# Patient Record
Sex: Male | Born: 1950
Health system: Southern US, Community
[De-identification: ages and names within clinical notes are randomized; demographics above are authoritative.]

## PROBLEM LIST (undated history)

## (undated) DIAGNOSIS — J449 Chronic obstructive pulmonary disease, unspecified: Secondary | ICD-10-CM

## (undated) DIAGNOSIS — E785 Hyperlipidemia, unspecified: Secondary | ICD-10-CM

## (undated) DIAGNOSIS — N189 Chronic kidney disease, unspecified: Secondary | ICD-10-CM

## (undated) DIAGNOSIS — C4490 Unspecified malignant neoplasm of skin, unspecified: Secondary | ICD-10-CM

## (undated) DIAGNOSIS — F419 Anxiety disorder, unspecified: Secondary | ICD-10-CM

## (undated) DIAGNOSIS — G629 Polyneuropathy, unspecified: Secondary | ICD-10-CM

## (undated) DIAGNOSIS — R011 Cardiac murmur, unspecified: Secondary | ICD-10-CM

## (undated) DIAGNOSIS — R0600 Dyspnea, unspecified: Secondary | ICD-10-CM

## (undated) DIAGNOSIS — I739 Peripheral vascular disease, unspecified: Secondary | ICD-10-CM

## (undated) DIAGNOSIS — I10 Essential (primary) hypertension: Secondary | ICD-10-CM

## (undated) DIAGNOSIS — I639 Cerebral infarction, unspecified: Secondary | ICD-10-CM

## (undated) DIAGNOSIS — E119 Type 2 diabetes mellitus without complications: Secondary | ICD-10-CM

## (undated) DIAGNOSIS — M1990 Unspecified osteoarthritis, unspecified site: Secondary | ICD-10-CM

## (undated) HISTORY — DX: Chronic kidney disease, unspecified: N18.9

## (undated) HISTORY — DX: Type 2 diabetes mellitus without complications: E11.9

## (undated) HISTORY — DX: Cardiac murmur, unspecified: R01.1

## (undated) HISTORY — PX: OTHER SURGICAL HISTORY: SHX169

## (undated) HISTORY — DX: Essential (primary) hypertension: I10

## (undated) HISTORY — DX: Unspecified malignant neoplasm of skin, unspecified: C44.90

## (undated) HISTORY — DX: Hyperlipidemia, unspecified: E78.5

---

## 2004-09-06 ENCOUNTER — Encounter: Admission: RE | Admit: 2004-09-06 | Discharge: 2004-09-06 | Payer: Self-pay | Admitting: Family Medicine

## 2004-10-10 ENCOUNTER — Ambulatory Visit (HOSPITAL_COMMUNITY): Admission: RE | Admit: 2004-10-10 | Discharge: 2004-10-10 | Payer: Self-pay | Admitting: Family Medicine

## 2004-11-06 ENCOUNTER — Ambulatory Visit (HOSPITAL_COMMUNITY): Admission: RE | Admit: 2004-11-06 | Discharge: 2004-11-06 | Payer: Self-pay | Admitting: Vascular Surgery

## 2004-11-14 ENCOUNTER — Ambulatory Visit (HOSPITAL_COMMUNITY): Admission: RE | Admit: 2004-11-14 | Discharge: 2004-11-14 | Payer: Self-pay | Admitting: Vascular Surgery

## 2004-11-20 ENCOUNTER — Encounter: Admission: RE | Admit: 2004-11-20 | Discharge: 2004-11-20 | Payer: Self-pay | Admitting: Vascular Surgery

## 2004-11-30 ENCOUNTER — Inpatient Hospital Stay (HOSPITAL_COMMUNITY): Admission: RE | Admit: 2004-11-30 | Discharge: 2004-12-09 | Payer: Self-pay | Admitting: Vascular Surgery

## 2010-02-04 ENCOUNTER — Encounter: Payer: Self-pay | Admitting: Vascular Surgery

## 2011-07-31 ENCOUNTER — Ambulatory Visit: Payer: Self-pay | Admitting: Family Medicine

## 2011-07-31 VITALS — BP 148/70 | HR 67 | Temp 98.2°F | Resp 18 | Ht 68.5 in | Wt 175.6 lb

## 2011-07-31 DIAGNOSIS — T22199A Burn of first degree of multiple sites of unspecified shoulder and upper limb, except wrist and hand, initial encounter: Secondary | ICD-10-CM

## 2011-07-31 DIAGNOSIS — T300 Burn of unspecified body region, unspecified degree: Secondary | ICD-10-CM

## 2011-07-31 MED ORDER — SILVER SULFADIAZINE 1 % EX CREA: TOPICAL_CREAM | Freq: Every day | CUTANEOUS | Status: AC

## 2011-07-31 MED ORDER — DOXYCYCLINE HYCLATE 100 MG PO TABS: 100.0000 mg | ORAL_TABLET | Freq: Two times a day (BID) | ORAL | Status: AC
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
## 2011-07-31 NOTE — Patient Instructions (Addendum)

## 2011-08-02 ENCOUNTER — Telehealth: Payer: Self-pay
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
## 2011-08-03 MED ORDER — CEPHALEXIN 500 MG PO CAPS: 500.0000 mg | ORAL_CAPSULE | Freq: Three times a day (TID) | ORAL | Status: AC

## 2011-08-03 NOTE — Telephone Encounter (Signed)
Med changed to Keflex but order is pending until we verify pharmacy

## 2011-08-03 NOTE — Telephone Encounter (Signed)
Had to send to pharm already noted

## 2011-08-03 NOTE — Telephone Encounter (Signed)
Spoke with patient, he states the Doxy he was given is causing his stomach to burn.  Has tried taking medicine with food and without, and with milk.  Can we change him to a different medicine or recommend something else to help?

## 2011-08-03 NOTE — Addendum Note (Signed)
Addended by: Pattricia Boss on: 08/03/2011 07:01 PM   Modules accepted: Orders

## 2011-08-03 NOTE — Telephone Encounter (Signed)
Patient notified

## 2013-02-03 ENCOUNTER — Ambulatory Visit: Payer: Self-pay | Admitting: Family Medicine

## 2013-02-03 VITALS — BP 126/82 | HR 72 | Temp 97.4°F | Resp 18 | Ht 68.0 in | Wt 172.0 lb

## 2013-02-03 DIAGNOSIS — L0291 Cutaneous abscess, unspecified: Secondary | ICD-10-CM

## 2013-02-03 DIAGNOSIS — L0390 Cellulitis, unspecified: Principal | ICD-10-CM

## 2013-02-03 DIAGNOSIS — E119 Type 2 diabetes mellitus without complications: Secondary | ICD-10-CM

## 2013-02-03 DIAGNOSIS — L408 Other psoriasis: Secondary | ICD-10-CM

## 2013-02-03 MED ORDER — TRIAMCINOLONE ACETONIDE 0.1 % EX CREA: 1.0000 "application " | TOPICAL_CREAM | Freq: Two times a day (BID) | CUTANEOUS | Status: DC

## 2013-02-03 MED ORDER — DOXYCYCLINE HYCLATE 100 MG PO CAPS: 100.0000 mg | ORAL_CAPSULE | Freq: Two times a day (BID) | ORAL | Status: DC

## 2013-02-03 NOTE — Patient Instructions (Signed)
Keep hands well washed  Keep any draining lesions covered  Apply triamcinolone cream twice daily to rash  Take the doxycycline one twice daily at breakfast and supper for infection.  Avoid taking it with dairy products such as milk or cheese because that will mass up the absorption of the antibiotic into your system  Return in 2 or 3 days for recheck if not improving considerably. Return at any time if worse.

## 2013-02-03 NOTE — Progress Notes (Signed)
Subjective: 63 year old man with a 2 to three-week history of a the rash on the knuckles of his right hand, which is gotten red and has some pustules or small abscesses on it. This been painful to him. He is diabetic well-controlled sugars.  Objective: Cellulitis across the back of his right hand involving the for knuckles. There a couple of a small abscess areas, and a couple of these were opened with a needle. Some pus was expressed and cultured. The hand is tender. Has dry flaking skin around the borders of and looks like it may have been a psoriatic-type rash initially.  Assessment: Cellulitis and abscess right hand Psoriatic-like rash  Plan: Doxycycline 100 twice a day Triamcinolone cream Watch it closely, and if at all worse come in at any time for recheck

## 2013-02-05 LAB — WOUND CULTURE
GRAM STAIN: NONE SEEN
Organism ID, Bacteria: NO GROWTH

## 2013-02-12 ENCOUNTER — Ambulatory Visit: Payer: Self-pay | Admitting: Physician Assistant

## 2013-02-12 ENCOUNTER — Telehealth: Payer: Self-pay

## 2013-02-12 VITALS — BP 142/80 | HR 66 | Temp 97.6°F | Resp 16 | Ht 68.0 in | Wt 172.0 lb

## 2013-02-12 DIAGNOSIS — L409 Psoriasis, unspecified: Secondary | ICD-10-CM

## 2013-02-12 DIAGNOSIS — L0390 Cellulitis, unspecified: Secondary | ICD-10-CM

## 2013-02-12 DIAGNOSIS — L408 Other psoriasis: Secondary | ICD-10-CM

## 2013-02-12 DIAGNOSIS — L0291 Cutaneous abscess, unspecified: Secondary | ICD-10-CM

## 2013-02-12 MED ORDER — CEPHALEXIN 500 MG PO CAPS: 500.0000 mg | ORAL_CAPSULE | Freq: Three times a day (TID) | ORAL | Status: DC

## 2013-02-12 MED ORDER — SULFAMETHOXAZOLE-TMP DS 800-160 MG PO TABS: 1.0000 | ORAL_TABLET | Freq: Two times a day (BID) | ORAL | Status: DC

## 2013-02-12 NOTE — Telephone Encounter (Signed)
Patient is asking for his lab results.  Was not aware until today's OV w/ Ryan about sending the labs out.    [phone]
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
## 2013-02-13 NOTE — Telephone Encounter (Signed)
Ryan please review.

## 2013-02-13 NOTE — Telephone Encounter (Signed)
No growth 2 days. Follow up if symptoms persist.

## 2013-02-13 NOTE — Telephone Encounter (Signed)
Spoke with pt, advised no growth in his wound cx.

## 2013-04-01 ENCOUNTER — Telehealth: Payer: Self-pay

## 2013-04-01 NOTE — Telephone Encounter (Signed)
Called and spoke to patient, said he would rtc early Monday .

## 2013-04-01 NOTE — Telephone Encounter (Signed)
Sorry, they should be healed by now. RTC.

## 2013-04-01 NOTE — Telephone Encounter (Signed)
Patient saw Christell Faith for hand infection.  He has finished the antibiotics.  Still has a couple of areas that are red and infected.   Requesting another round of antibiotics to complete the healing process.   [facility] [address]    [phone]

## 2013-04-05 ENCOUNTER — Ambulatory Visit: Payer: Self-pay | Admitting: Family Medicine

## 2013-04-05 VITALS — BP 162/68 | HR 73 | Temp 98.1°F | Resp 16 | Ht 68.0 in | Wt 172.6 lb

## 2013-04-05 DIAGNOSIS — L409 Psoriasis, unspecified: Secondary | ICD-10-CM

## 2013-04-05 DIAGNOSIS — E119 Type 2 diabetes mellitus without complications: Secondary | ICD-10-CM

## 2013-04-05 DIAGNOSIS — L0390 Cellulitis, unspecified: Secondary | ICD-10-CM

## 2013-04-05 DIAGNOSIS — L0291 Cutaneous abscess, unspecified: Secondary | ICD-10-CM

## 2013-04-05 DIAGNOSIS — L408 Other psoriasis: Secondary | ICD-10-CM

## 2013-04-05 MED ORDER — BETAMETHASONE DIPROPIONATE 0.05 % EX CREA: TOPICAL_CREAM | Freq: Two times a day (BID) | CUTANEOUS | Status: DC

## 2013-04-05 MED ORDER — SULFAMETHOXAZOLE-TMP DS 800-160 MG PO TABS: 1.0000 | ORAL_TABLET | Freq: Two times a day (BID) | ORAL | Status: DC

## 2013-04-05 NOTE — Patient Instructions (Signed)
Take the antibiotic one twice daily  Wait until about Sunday for beginning to use a small amount of the betamethasone cream twice daily on the skin. Make sure the infection looks well healed before starting the cream.  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  Return at any time if worse.

## 2013-04-05 NOTE — Progress Notes (Signed)
Subjective

## 2013-07-28 ENCOUNTER — Ambulatory Visit (INDEPENDENT_AMBULATORY_CARE_PROVIDER_SITE_OTHER): Payer: Self-pay | Admitting: Family Medicine

## 2013-07-28 VITALS — BP 132/86 | HR 75 | Temp 97.8°F | Resp 16 | Ht 68.0 in | Wt 162.8 lb

## 2013-07-28 DIAGNOSIS — H938X1 Other specified disorders of right ear: Secondary | ICD-10-CM

## 2013-07-28 DIAGNOSIS — H6590 Unspecified nonsuppurative otitis media, unspecified ear: Secondary | ICD-10-CM

## 2013-07-28 DIAGNOSIS — H938X9 Other specified disorders of ear, unspecified ear: Secondary | ICD-10-CM

## 2013-07-28 MED ORDER — AMOXICILLIN 500 MG PO CAPS: 500.0000 mg | ORAL_CAPSULE | Freq: Three times a day (TID) | ORAL | Status: DC

## 2013-07-28 NOTE — Progress Notes (Signed)
Chief Complaint:  Chief Complaint  Patient presents with  . Ear Fullness    feels like water is in his rt ear x 3 days    HPI: Adrian Andrews is a 63 y.o. male who is here for 3 day history of right ear fullness and jaw pain, no fevers or chills, no dc and no water exposure. He had put ear wax drops x 1 in it and thought it would improve but did not. No hearing loss.   Past Medical History  Diagnosis Date  . Diabetes mellitus without complication   . Hyperlipidemia   . Hypertension    Past Surgical History  Procedure Laterality Date  . Stint     History   Social History  . Marital Status: Single    Spouse Name: N/A    Number of Children: N/A  . Years of Education: N/A   Social History Main Topics  . Smoking status: Current Every Day Smoker -- 0.80 packs/day    Types: Cigarettes  . Smokeless tobacco: None  . Alcohol Use: None  . Drug Use: None  . Sexual Activity: None   Other Topics Concern  . None   Social History Narrative  . None   History reviewed. No pertinent family history. Allergies  Allergen Reactions  . Clindamycin/Lincomycin     Throat tightens up   Prior to Admission medications   Medication Sig Start Date End Date Taking? Authorizing Provider  aspirin 81 MG tablet Take 81 mg by mouth daily.    Historical Provider, MD  atenolol (TENORMIN) 50 MG tablet Take 50 mg by mouth 2 (two) times daily.    Historical Provider, MD  atorvastatin (LIPITOR) 40 MG tablet Take 40 mg by mouth daily.    Historical Provider, MD  cephALEXin (KEFLEX) 500 MG capsule Take 1 capsule (500 mg total) by mouth 3 (three) times daily. 02/12/13   Rise Mu, PA-C  gabapentin (NEURONTIN) 300 MG capsule Take 300 mg by mouth 2 (two) times daily.    Historical Provider, MD  glipiZIDE (GLUCOTROL) 5 MG tablet Take 5 mg by mouth 2 (two) times daily before a meal.    Historical Provider, MD  metFORMIN (GLUCOPHAGE) 500 MG tablet Take 500 mg by mouth 2 (two) times daily with a meal.     Historical Provider, MD  simvastatin (ZOCOR) 20 MG tablet Take 20 mg by mouth every evening.    Historical Provider, MD  triamcinolone cream (KENALOG) 0.1 % Apply 1 application topically 2 (two) times daily. 02/03/13   Posey Boyer, MD     ROS: The patient denies fevers, chills, night sweats, unintentional weight loss, chest pain, palpitations, wheezing, dyspnea on exertion, nausea, vomiting, abdominal pain, dysuria, hematuria, melena, numbness, weakness, or tingling.   All other systems have been reviewed and were otherwise negative with the exception of those mentioned in the HPI and as above.    PHYSICAL EXAM: Filed Vitals:   07/28/13 0819  BP: 132/86  Pulse: 75  Temp: 97.8 F (36.6 C)  Resp: 16   Filed Vitals:   07/28/13 0819  Height: 5\' 8"  (1.727 m)  Weight: 162 lb 12.8 oz (73.846 kg)   Body mass index is 24.76 kg/(m^2).  General: Alert, no acute distress HEENT:  Normocephalic, atraumatic, oropharynx patent. EOMI, PERRLA. Right TM is dull, no erythema. No dc.  Cardiovascular:  Regular rate and rhythm, no rubs murmurs or gallops.  No Carotid bruits, radial pulse intact. No pedal  edema.  Respiratory: Clear to auscultation bilaterally.  No wheezes, rales, or rhonchi.  No cyanosis, no use of accessory musculature GI: No organomegaly, abdomen is soft and non-tender, positive bowel sounds.  No masses. Skin: No rashes. Neurologic: Facial musculature symmetric. Psychiatric: Patient is appropriate throughout our interaction. Lymphatic: No cervical lymphadenopathy Musculoskeletal: Gait intact.   LABS: Results for orders placed in visit on 02/03/13  WOUND CULTURE      Result Value Ref Range   Gram Stain Few     Gram Stain WBC present-both PMN and Mononuclear     Gram Stain Rare Squamous Epithelial Cells Present     Gram Stain No Organisms Seen     Organism ID, Bacteria NO GROWTH 2 DAYS       EKG/XRAY:   Primary read interpreted by Dr. Marin Comment at  [facility].   ASSESSMENT/PLAN: Encounter Diagnoses  Name Primary?  . Ear fullness, right Yes  . Nonsuppurative otitis media, not specified as acute or chronic    Rx amoxacillin 500 mg TID Keep ears dry F/u prn  Gross sideeffects, risk and benefits, and alternatives of medications d/w patient. Patient is aware that all medications have potential sideeffects and we are unable to predict every sideeffect or drug-drug interaction that may occur.  Makoto Sellitto, [place], DO 07/28/2013 8:36 AM

## 2013-09-10 ENCOUNTER — Ambulatory Visit (INDEPENDENT_AMBULATORY_CARE_PROVIDER_SITE_OTHER): Payer: Self-pay | Admitting: Emergency Medicine

## 2013-09-10 ENCOUNTER — Ambulatory Visit (INDEPENDENT_AMBULATORY_CARE_PROVIDER_SITE_OTHER): Payer: Self-pay

## 2013-09-10 VITALS — BP 164/80 | HR 74 | Temp 97.9°F | Resp 18 | Ht 68.0 in | Wt 162.0 lb

## 2013-09-10 DIAGNOSIS — M545 Low back pain, unspecified: Secondary | ICD-10-CM

## 2013-09-10 DIAGNOSIS — S20211A Contusion of right front wall of thorax, initial encounter: Secondary | ICD-10-CM

## 2013-09-10 DIAGNOSIS — S20219A Contusion of unspecified front wall of thorax, initial encounter: Secondary | ICD-10-CM

## 2013-09-10 DIAGNOSIS — W19XXXA Unspecified fall, initial encounter: Secondary | ICD-10-CM

## 2013-09-10 LAB — POCT URINALYSIS DIPSTICK
Bilirubin, UA: NEGATIVE
Blood, UA: NEGATIVE
Glucose, UA: 500
Ketones, UA: NEGATIVE
Leukocytes, UA: NEGATIVE
Nitrite, UA: NEGATIVE
Protein, UA: 30
Spec Grav, UA: 1.01
UROBILINOGEN UA: 0.2
pH, UA: 5.5

## 2013-09-10 MED ORDER — HYDROCODONE-ACETAMINOPHEN 5-325 MG PO TABS: 1.0000 | ORAL_TABLET | ORAL | Status: DC | PRN

## 2013-09-10 NOTE — Progress Notes (Signed)
[facility] [address] [place] [address] [phone]  Date:  09/10/2013   Name:  Adrian Andrews   DOB:  10/08/1950   MRN:  [id number]  PCP:  No PCP Per Patient    Chief Complaint: Back Pain   History of Present Illness:  Adrian Andrews is a 63 y.o. very pleasant male patient who presents with the following:  Tripped and fell during the night and landed on his right flank.  Has marked pain and is nearly immobile. Pain is pleuritic and he has increased pain with movement.  No hemoptysis Smokes a pack a day No nausea or vomiting. No abdominal pain.  No hematuria or clots. Pain is not radiating and has no neuro symptoms. No improvement with over the counter medications or other home remedies.  Denies other complaint or health concern today.   There are no active problems to display for this patient.   Past Medical History  Diagnosis Date  . Diabetes mellitus without complication   . Hyperlipidemia   . Hypertension     Past Surgical History  Procedure Laterality Date  . Stint      History  Substance Use Topics  . Smoking status: Current Every Day Smoker -- 0.80 packs/day    Types: Cigarettes  . Smokeless tobacco: Not on file  . Alcohol Use: Not on file    History reviewed. No pertinent family history.  Allergies  Allergen Reactions  . Clindamycin/Lincomycin     Throat tightens up    Medication list has been reviewed and updated.  Current Outpatient Prescriptions on File Prior to Visit  Medication Sig Dispense Refill  . aspirin 81 MG tablet Take 81 mg by mouth daily.      Marland Kitchen atenolol (TENORMIN) 50 MG tablet Take 50 mg by mouth 2 (two) times daily.      Marland Kitchen atorvastatin (LIPITOR) 40 MG tablet Take 40 mg by mouth daily.      Marland Kitchen gabapentin (NEURONTIN) 300 MG capsule Take 300 mg by mouth 2 (two) times daily.      Marland Kitchen glipiZIDE (GLUCOTROL) 5 MG tablet Take 5 mg by mouth 2 (two) times daily before a meal.      . triamcinolone cream  (KENALOG) 0.1 % Apply 1 application topically 2 (two) times daily.  30 g  0   No current facility-administered medications on file prior to visit.    Review of Systems:  As per HPI, otherwise negative.    Physical Examination: Filed Vitals:   09/10/13 1248  BP: 164/80  Pulse: 74  Temp: 97.9 F (36.6 C)  Resp: 18   Filed Vitals:   09/10/13 1248  Height: 5\' 8"  (1.727 m)  Weight: 162 lb (73.483 kg)   Body mass index is 24.64 kg/(m^2). Ideal Body Weight: Weight in (lb) to have BMI = 25: 164.1  GEN: WDWN, NAD, Non-toxic, A & O x 3 HEENT: Atraumatic, Normocephalic. Neck supple. No masses, No LAD. Ears and Nose: No external deformity. CV: RRR, No M/G/R. No JVD. No thrill. No extra heart sounds. PULM: CTA B, no wheezes, crackles, rhonchi. No retractions. No resp. distress. No accessory muscle use.  Marked tenderness lower chest wall on right posteriorly ABD: S, NT, ND, +BS. No rebound. No HSM.  Midline scar with moderate incisional hernia. EXTR: No c/c/e NEURO Normal gait.  PSYCH: Normally interactive. Conversant. Not depressed or anxious appearing.  Calm demeanor.   Results for orders placed in visit on 09/10/13  POCT URINALYSIS DIPSTICK      Result Value Ref Range   Color, UA yellow     Clarity, UA clear     Glucose, UA 500     Bilirubin, UA neg     Ketones, UA neg     Spec Grav, UA 1.010     Blood, UA neg     pH, UA 5.5     Protein, UA 30     Urobilinogen, UA 0.2     Nitrite, UA neg     Leukocytes, UA Negative       Assessment and Plan: Chest contusion vicodin Limit activity  Signed,  Ellison Carwin, MD   [facility] reading (PRIMARY) by  Dr. Ouida Sills.  Negative chest .

## 2013-09-10 NOTE — Patient Instructions (Signed)
Rib Rib Fracture A rib fracture is a break or crack in one of the bones of the ribs. The ribs are a group of long, curved bones that wrap around your chest and attach to your spine. They protect your lungs and other organs in the chest cavity. A broken or cracked rib is often painful, but most do not cause other problems. Most rib fractures heal on their own over time. However, rib fractures can be more serious if multiple ribs are broken or if broken ribs move out of place and push against other structures. CAUSES   A direct blow to the chest. For example, this could happen during contact sports, a car accident, or a fall against a hard object.  Repetitive movements with high force, such as pitching a baseball or having severe coughing spells. SYMPTOMS   Pain when you breathe in or cough.  Pain when someone presses on the injured area. DIAGNOSIS  Your caregiver will perform a physical exam. Various imaging tests may be ordered to confirm the diagnosis and to look for related injuries. These tests may include a chest X-ray, computed tomography (CT), magnetic resonance imaging (MRI), or a bone scan. TREATMENT  Rib fractures usually heal on their own in 1-3 months. The longer healing period is often associated with a continued cough or other aggravating activities. During the healing period, pain control is very important. Medication is usually given to control pain. Hospitalization or surgery may be needed for more severe injuries, such as those in which multiple ribs are broken or the ribs have moved out of place.  HOME CARE INSTRUCTIONS   Avoid strenuous activity and any activities or movements that cause pain. Be careful during activities and avoid bumping the injured rib.  Gradually increase activity as directed by your caregiver.  Only take over-the-counter or prescription medications as directed by your caregiver. Do not take other medications without asking your caregiver first.  Apply  ice to the injured area for the first 1-2 days after you have been treated or as directed by your caregiver. Applying ice helps to reduce inflammation and pain.  Put ice in a plastic bag.  Place a towel between your skin and the bag.   Leave the ice on for 15-20 minutes at a time, every 2 hours while you are awake.  Perform deep breathing as directed by your caregiver. This will help prevent pneumonia, which is a common complication of a broken rib. Your caregiver may instruct you to:  Take deep breaths several times a day.  Try to cough several times a day, holding a pillow against the injured area.  Use a device called an incentive spirometer to practice deep breathing several times a day.  Drink enough fluids to keep your urine clear or pale yellow. This will help you avoid constipation.   Do not wear a rib belt or binder. These restrict breathing, which can lead to pneumonia.  SEEK IMMEDIATE MEDICAL CARE IF:   You have a fever.   You have difficulty breathing or shortness of breath.   You develop a continual cough, or you cough up thick or bloody sputum.  You feel sick to your stomach (nausea), throw up (vomit), or have abdominal pain.   You have worsening pain not controlled with medications.  MAKE SURE YOU:  Understand these instructions.  Will watch your condition.  Will get help right away if you are not doing well or get worse. Document Released: 12/31/2004 Document Revised: 09/02/2012 Document  Reviewed: 03/04/2012 Exit[employer] Patient Information 2015 [place], [place]. This information is not intended to replace advice given to you by your health care provider. Make sure you discuss any questions you have with your health care provider.

## 2013-11-11 DIAGNOSIS — L739 Follicular disorder, unspecified: Secondary | ICD-10-CM | POA: Diagnosis not present

## 2013-11-11 DIAGNOSIS — L089 Local infection of the skin and subcutaneous tissue, unspecified: Secondary | ICD-10-CM | POA: Diagnosis not present

## 2013-11-18 DIAGNOSIS — L309 Dermatitis, unspecified: Secondary | ICD-10-CM | POA: Diagnosis not present

## 2013-11-23 DIAGNOSIS — L089 Local infection of the skin and subcutaneous tissue, unspecified: Secondary | ICD-10-CM | POA: Diagnosis not present

## 2013-11-23 DIAGNOSIS — L309 Dermatitis, unspecified: Secondary | ICD-10-CM | POA: Diagnosis not present

## 2013-11-23 DIAGNOSIS — L739 Follicular disorder, unspecified: Secondary | ICD-10-CM | POA: Diagnosis not present

## 2013-11-26 DIAGNOSIS — B352 Tinea manuum: Secondary | ICD-10-CM | POA: Diagnosis not present

## 2013-12-23 DIAGNOSIS — B352 Tinea manuum: Secondary | ICD-10-CM | POA: Diagnosis not present

## 2013-12-23 DIAGNOSIS — X32XXXD Exposure to sunlight, subsequent encounter: Secondary | ICD-10-CM | POA: Diagnosis not present

## 2013-12-23 DIAGNOSIS — L570 Actinic keratosis: Secondary | ICD-10-CM | POA: Diagnosis not present

## 2014-02-01 DIAGNOSIS — L570 Actinic keratosis: Secondary | ICD-10-CM | POA: Diagnosis not present

## 2014-02-01 DIAGNOSIS — X32XXXD Exposure to sunlight, subsequent encounter: Secondary | ICD-10-CM | POA: Diagnosis not present

## 2014-02-01 DIAGNOSIS — B352 Tinea manuum: Secondary | ICD-10-CM | POA: Diagnosis not present

## 2014-03-15 DIAGNOSIS — L570 Actinic keratosis: Secondary | ICD-10-CM | POA: Diagnosis not present

## 2014-03-15 DIAGNOSIS — X32XXXD Exposure to sunlight, subsequent encounter: Secondary | ICD-10-CM | POA: Diagnosis not present

## 2014-03-15 DIAGNOSIS — B358 Other dermatophytoses: Secondary | ICD-10-CM | POA: Diagnosis not present

## 2014-07-12 DIAGNOSIS — L570 Actinic keratosis: Secondary | ICD-10-CM | POA: Diagnosis not present

## 2014-07-21 DIAGNOSIS — L570 Actinic keratosis: Secondary | ICD-10-CM | POA: Diagnosis not present

## 2014-09-22 DIAGNOSIS — L570 Actinic keratosis: Secondary | ICD-10-CM | POA: Diagnosis not present

## 2014-10-04 DIAGNOSIS — E1165 Type 2 diabetes mellitus with hyperglycemia: Secondary | ICD-10-CM | POA: Diagnosis not present

## 2014-10-04 DIAGNOSIS — E785 Hyperlipidemia, unspecified: Secondary | ICD-10-CM | POA: Diagnosis not present

## 2014-10-04 DIAGNOSIS — F419 Anxiety disorder, unspecified: Secondary | ICD-10-CM | POA: Diagnosis not present

## 2014-10-04 DIAGNOSIS — G4700 Insomnia, unspecified: Secondary | ICD-10-CM | POA: Diagnosis not present

## 2014-10-04 DIAGNOSIS — E1151 Type 2 diabetes mellitus with diabetic peripheral angiopathy without gangrene: Secondary | ICD-10-CM | POA: Diagnosis not present

## 2014-10-04 DIAGNOSIS — M1611 Unilateral primary osteoarthritis, right hip: Secondary | ICD-10-CM | POA: Diagnosis not present

## 2014-10-04 DIAGNOSIS — I10 Essential (primary) hypertension: Secondary | ICD-10-CM | POA: Diagnosis not present

## 2014-10-04 DIAGNOSIS — F17200 Nicotine dependence, unspecified, uncomplicated: Secondary | ICD-10-CM | POA: Diagnosis not present

## 2014-10-25 DIAGNOSIS — L570 Actinic keratosis: Secondary | ICD-10-CM | POA: Diagnosis not present

## 2014-11-22 DIAGNOSIS — L570 Actinic keratosis: Secondary | ICD-10-CM | POA: Diagnosis not present

## 2014-12-29 DIAGNOSIS — I129 Hypertensive chronic kidney disease with stage 1 through stage 4 chronic kidney disease, or unspecified chronic kidney disease: Secondary | ICD-10-CM | POA: Diagnosis not present

## 2014-12-29 DIAGNOSIS — N183 Chronic kidney disease, stage 3 (moderate): Secondary | ICD-10-CM | POA: Diagnosis not present

## 2014-12-29 DIAGNOSIS — E785 Hyperlipidemia, unspecified: Secondary | ICD-10-CM | POA: Diagnosis not present

## 2014-12-29 DIAGNOSIS — E1151 Type 2 diabetes mellitus with diabetic peripheral angiopathy without gangrene: Secondary | ICD-10-CM | POA: Diagnosis not present

## 2014-12-29 DIAGNOSIS — R011 Cardiac murmur, unspecified: Secondary | ICD-10-CM | POA: Diagnosis not present

## 2014-12-29 DIAGNOSIS — E1165 Type 2 diabetes mellitus with hyperglycemia: Secondary | ICD-10-CM | POA: Diagnosis not present

## 2014-12-29 DIAGNOSIS — E1121 Type 2 diabetes mellitus with diabetic nephropathy: Secondary | ICD-10-CM | POA: Diagnosis not present

## 2015-01-14 ENCOUNTER — Emergency Department (HOSPITAL_COMMUNITY)
Admission: EM | Admit: 2015-01-14 | Discharge: 2015-01-14 | Disposition: A | Discharge status: Home / Self Care | Payer: Medicare Other | Attending: Emergency Medicine | Admitting: Emergency Medicine

## 2015-01-14 ENCOUNTER — Emergency Department (HOSPITAL_COMMUNITY): Payer: Medicare Other

## 2015-01-14 ENCOUNTER — Encounter (HOSPITAL_COMMUNITY): Payer: Self-pay | Admitting: *Deleted

## 2015-01-14 DIAGNOSIS — J069 Acute upper respiratory infection, unspecified: Secondary | ICD-10-CM

## 2015-01-14 DIAGNOSIS — B349 Viral infection, unspecified: Secondary | ICD-10-CM | POA: Diagnosis not present

## 2015-01-14 DIAGNOSIS — H6501 Acute serous otitis media, right ear: Secondary | ICD-10-CM | POA: Diagnosis not present

## 2015-01-14 DIAGNOSIS — F17210 Nicotine dependence, cigarettes, uncomplicated: Secondary | ICD-10-CM | POA: Insufficient documentation

## 2015-01-14 DIAGNOSIS — E785 Hyperlipidemia, unspecified: Secondary | ICD-10-CM | POA: Insufficient documentation

## 2015-01-14 DIAGNOSIS — Z7982 Long term (current) use of aspirin: Secondary | ICD-10-CM | POA: Insufficient documentation

## 2015-01-14 DIAGNOSIS — Z79899 Other long term (current) drug therapy: Secondary | ICD-10-CM | POA: Diagnosis not present

## 2015-01-14 DIAGNOSIS — E119 Type 2 diabetes mellitus without complications: Secondary | ICD-10-CM | POA: Diagnosis not present

## 2015-01-14 DIAGNOSIS — R202 Paresthesia of skin: Secondary | ICD-10-CM | POA: Diagnosis not present

## 2015-01-14 DIAGNOSIS — R200 Anesthesia of skin: Secondary | ICD-10-CM | POA: Diagnosis not present

## 2015-01-14 DIAGNOSIS — H9201 Otalgia, right ear: Secondary | ICD-10-CM | POA: Diagnosis present

## 2015-01-14 DIAGNOSIS — I10 Essential (primary) hypertension: Secondary | ICD-10-CM | POA: Insufficient documentation

## 2015-01-14 LAB — COMPREHENSIVE METABOLIC PANEL
ALT: 30 U/L (ref 17–63)
AST: 30 U/L (ref 15–41)
Albumin: 4.2 g/dL (ref 3.5–5.0)
Alkaline Phosphatase: 64 U/L (ref 38–126)
Anion gap: 12 (ref 5–15)
BUN: 19 mg/dL (ref 6–20)
CO2: 23 mmol/L (ref 22–32)
Calcium: 9.7 mg/dL (ref 8.9–10.3)
Chloride: 100 mmol/L — ABNORMAL LOW (ref 101–111)
Creatinine, Ser: 1.4 mg/dL — ABNORMAL HIGH (ref 0.61–1.24)
GFR calc Af Amer: 59 mL/min — ABNORMAL LOW (ref >60)
GFR calc non Af Amer: 51 mL/min — ABNORMAL LOW (ref >60)
Glucose, Bld: 296 mg/dL — ABNORMAL HIGH (ref 65–99)
Potassium: 4.4 mmol/L (ref 3.5–5.1)
SODIUM: 135 mmol/L (ref 135–145)
Total Bilirubin: 0.8 mg/dL (ref 0.3–1.2)
Total Protein: 7 g/dL (ref 6.5–8.1)

## 2015-01-14 LAB — CBC
HCT: 47.4 % (ref 39.0–52.0)
Hemoglobin: 16.9 g/dL (ref 13.0–17.0)
MCH: 33.5 pg (ref 26.0–34.0)
MCHC: 35.7 g/dL (ref 30.0–36.0)
MCV: 94 fL (ref 78.0–100.0)
Platelets: 162 10*3/uL (ref 150–400)
RBC: 5.04 MIL/uL (ref 4.22–5.81)
RDW: 14.4 % (ref 11.5–15.5)
WBC: 7.9 10*3/uL (ref 4.0–10.5)

## 2015-01-14 MED ORDER — HYDROCODONE-ACETAMINOPHEN 5-325 MG PO TABS
2.0000 | ORAL_TABLET | Freq: Once | ORAL | Status: AC
  Administered 2015-01-14: 2 via ORAL
  Filled 2015-01-14: qty 2

## 2015-01-14 MED ORDER — CETIRIZINE-PSEUDOEPHEDRINE ER 5-120 MG PO TB12: 1.0000 | ORAL_TABLET | Freq: Two times a day (BID) | ORAL | Status: DC | PRN

## 2015-01-14 NOTE — ED Notes (Signed)
NAD at this time. Pt is stable and going home.  

## 2015-01-14 NOTE — ED Provider Notes (Signed)
CSN: [id number]     Arrival date & time 01/14/15  [id number] History   First MD Initiated Contact with Patient 01/14/15 1001     Chief Complaint  Patient presents with  . Numbness  . Otalgia  . Sore Throat  . Pain     (Consider location/radiation/quality/duration/timing/severity/associated sxs/prior Treatment) Patient is a 64 y.o. male presenting with ear pain and pharyngitis. The history is provided by the patient.  Otalgia Associated symptoms: congestion and sore throat   Associated symptoms: no abdominal pain, no cough, no fever, no headaches, no neck pain, no rash and no vomiting   Sore Throat Pertinent negatives include no chest pain, no abdominal pain, no headaches and no shortness of breath.  Patient c/o right ear ache, and sore throat in the past few days. S+nasal congestion.  ymptoms persistent since onset. Dull, moderate. No hearing loss.  Occasionally ringing in ear.    Pt indicates if he lays on left side, left arm will go numb, and says that at times the toes of both feet will feel numb/tingling - these symptoms have been ongoing for the past couple months with no acute change today. No weakness. No loss of normal function. No problems w speech or vision.  No change in balance, coordination or gait.   Pt indicates at [place] they did ?carotid dopplers, was told 'partial blockage' but that he didn't need surgery.  No headache. No cough or sob. No chest pain.  Denies fever or chills. Had loose bm today, no nv.       Past Medical History  Diagnosis Date  . Diabetes mellitus without complication ([place])   . Hyperlipidemia   . Hypertension    Past Surgical History  Procedure Laterality Date  . Stint     No family history on file. Social History  Substance Use Topics  . Smoking status: Current Every Day Smoker -- 0.80 packs/day    Types: Cigarettes  . Smokeless tobacco: None  . Alcohol Use: No    Review of Systems  Constitutional: Negative for fever and chills.  HENT:  Positive for congestion, ear pain and sore throat.   Eyes: Negative for pain and visual disturbance.  Respiratory: Negative for cough and shortness of breath.   Cardiovascular: Negative for chest pain and leg swelling.  Gastrointestinal: Negative for vomiting and abdominal pain.  Genitourinary: Negative for dysuria and flank pain.  Musculoskeletal: Negative for back pain and neck pain.  Skin: Negative for rash.  Neurological: Positive for numbness. Negative for syncope, speech difficulty, weakness and headaches.  Hematological: Does not bruise/bleed easily.  Psychiatric/Behavioral: Negative for confusion.      Allergies  Clindamycin/lincomycin and Metformin and related  Home Medications   Prior to Admission medications   Medication Sig Start Date End Date Taking? Authorizing Provider  aspirin 81 MG tablet Take 81 mg by mouth daily.   Yes Historical Provider, MD  atenolol (TENORMIN) 50 MG tablet Take 50 mg by mouth 2 (two) times daily.   Yes Historical Provider, MD  atorvastatin (LIPITOR) 40 MG tablet Take 40 mg by mouth daily. Take 0.5 tablet each night   Yes Historical Provider, MD  gabapentin (NEURONTIN) 300 MG capsule Take 300 mg by mouth 2 (two) times daily.   Yes Historical Provider, MD  glipiZIDE (GLUCOTROL) 5 MG tablet Take 5 mg by mouth 2 (two) times daily before a meal.   Yes Historical Provider, MD  traMADol (ULTRAM) 50 MG tablet Take 50 mg by mouth 2 (two) times  daily as needed for moderate pain (arthritis).   Yes Historical Provider, MD  triamcinolone cream (KENALOG) 0.1 % Apply 1 application topically 2 (two) times daily. 02/03/13  Yes Posey Boyer, MD   BP 144/68 mmHg  Pulse 82  Temp(Src) 98.7 F (37.1 C) (Oral)  Resp 17  Ht 5\' 8"  (1.727 m)  Wt 73.936 kg  BMI 24.79 kg/m2  SpO2 96% Physical Exam  Constitutional: He is oriented to person, place, and time. He appears well-developed and well-nourished. No distress.  HENT:  Head: Atraumatic.  Mouth/Throat:  Oropharynx is clear and moist.  Clear fluid behind right tm.     Eyes: Conjunctivae and EOM are normal. Pupils are equal, round, and reactive to light.  Neck: Neck supple. No tracheal deviation present. No thyromegaly present.  No bruits  Cardiovascular: Normal rate, regular rhythm, normal heart sounds and intact distal pulses.  Exam reveals no gallop and no friction rub.   No murmur heard. Pulmonary/Chest: Effort normal and breath sounds normal. No accessory muscle usage. No respiratory distress.  Abdominal: Soft. Bowel sounds are normal. He exhibits no distension. There is no tenderness.  Musculoskeletal: Normal range of motion. He exhibits no edema or tenderness.  Neurological: He is alert and oriented to person, place, and time. No cranial nerve deficit.  Motor intact bi. stre 5/5. sens grossly intact. No pronator drift. Ambulates w steady gait.   Skin: Skin is warm and dry. No rash noted.  Psychiatric: He has a normal mood and affect.  Nursing note and vitals reviewed.   ED Course  Procedures (including critical care time) Labs Review   Results for orders placed or performed during the hospital encounter of 01/14/15  CBC  Result Value Ref Range   WBC 7.9 4.0 - 10.5 K/uL   RBC 5.04 4.22 - 5.81 MIL/uL   Hemoglobin 16.9 13.0 - 17.0 g/dL   HCT 47.4 39.0 - 52.0 %   MCV 94.0 78.0 - 100.0 fL   MCH 33.5 26.0 - 34.0 pg   MCHC 35.7 30.0 - 36.0 g/dL   RDW 14.4 11.5 - 15.5 %   Platelets 162 150 - 400 K/uL  Comprehensive metabolic panel  Result Value Ref Range   Sodium 135 135 - 145 mmol/L   Potassium 4.4 3.5 - 5.1 mmol/L   Chloride 100 (L) 101 - 111 mmol/L   CO2 23 22 - 32 mmol/L   Glucose, Bld 296 (H) 65 - 99 mg/dL   BUN 19 6 - 20 mg/dL   Creatinine, Ser 1.40 (H) 0.61 - 1.24 mg/dL   Calcium 9.7 8.9 - 10.3 mg/dL   Total Protein 7.0 6.5 - 8.1 g/dL   Albumin 4.2 3.5 - 5.0 g/dL   AST 30 15 - 41 U/L   ALT 30 17 - 63 U/L   Alkaline Phosphatase 64 38 - 126 U/L   Total Bilirubin  0.8 0.3 - 1.2 mg/dL   GFR calc non Af Amer 51 (L) >60 mL/min   GFR calc Af Amer 59 (L) >60 mL/min   Anion gap 12 5 - 15   Ct Head Wo Contrast  01/14/2015  CLINICAL DATA:  Left arm and left leg numbness EXAM: CT HEAD WITHOUT CONTRAST TECHNIQUE: Contiguous axial images were obtained from the base of the skull through the vertex without intravenous contrast. COMPARISON:  None. FINDINGS: Mild global atrophy. No mass effect, midline shift, or acute intracranial hemorrhage. Ventricular system is unremarkable. The mastoid air cells are clear. Cranium is intact. IMPRESSION:  No acute intracranial pathology.  Global atrophy is noted. Electronically Signed   By: Marybelle Killings M.D.   On: 01/14/2015 11:09       I have personally reviewed and evaluated these images and lab results as part of my medical decision-making.   EKG Interpretation   Date/Time:  Saturday January 14 2015 09:40:02 EST Ventricular Rate:  87 PR Interval:  119 QRS Duration: 108 QT Interval:  377 QTC Calculation: 453 R Axis:   75 Text Interpretation:  Sinus rhythm Borderline short PR interval  Nonspecific ST abnormality Confirmed by Ashok Cordia  MD, Floyed Masoud ([id number]) on  01/14/2015 10:02:11 AM      MDM   Iv ns. Labs.  Reviewed nursing notes and prior charts for additional history.   Pt requests pain med in ED - states he normally takes pain med at home, and hasnt had today.   Pt does not have to drive home.  No meds pta. vicodin po.  Ct neg acute.   Pt ambulatory w steady gait. No nvd noted in ED.  Pt currently appears stable for d/c.  With nasal congestion, fluid/tm, rec antihist/decongestant as need.       Lajean Saver, MD 01/14/15 [phone]

## 2015-01-14 NOTE — ED Notes (Signed)
Pt comes from home. Per Pt and [facility], about 4 days ago, pt began to have numbness in lower extremities and Left arm when lying down. It resolves when he stands up.  Pt was diagnosed with a blockage on his left side several weeks ago, he thinks it is a carotid artery blockage.  Pt also has chronic neck and back pain.  Pt stated that he has a undiagnosed Right ear infection & ringing in that ear, dizziness, and severe sore throat.  Pt also has Diarrhea since the ear infection started. He gets nauseous before each diarrhea episode.  VS are as follows per EMS: Temp 98.4 BP: 161/85 HR: 85 SPO2: 96% CBG: 373

## 2015-01-14 NOTE — ED Notes (Signed)
Pt transported to CT ?

## 2015-01-14 NOTE — Discharge Instructions (Signed)
It was our pleasure to provide your ER care today - we hope that you feel better.  Rest. Drink adequate fluids.  Continue an aspirin a day.   You may take zyrtec-d as need for congestion.    You may use throat lozenges a need for symptom relief.   Take tylenol/advil as need.  Follow up with your primary care doctor this week for recheck - call office Monday or Tuesday morning to arrange follow up with them.  From today's lab tests, your blood sugar is high (296) - drink adequate fluids, continue medication, follow diabetic diet, and follow up with your primary care doctor.  Return to ER if worse, new symptoms, fevers, chest pain, trouble breathing, one-side of body numbness/weakness, change in speech or vision, other concern.      Serous Otitis Media Serous otitis media is fluid in the middle ear space. This space contains the bones for hearing and air. Air in the middle ear space helps to transmit sound.  The air gets there through the eustachian tube. This tube goes from the back of the nose (nasopharynx) to the middle ear space. It keeps the pressure in the middle ear the same as the outside world. It also helps to drain fluid from the middle ear space. CAUSES  Serous otitis media occurs when the eustachian tube gets blocked. Blockage can come from:  Ear infections.  Colds and other upper respiratory infections.  Allergies.  Irritants such as cigarette smoke.  Sudden changes in air pressure (such as descending in an airplane).  Enlarged adenoids.  A mass in the nasopharynx. During colds and upper respiratory infections, the middle ear space can become temporarily filled with fluid. This can happen after an ear infection also. Once the infection clears, the fluid will generally drain out of the ear through the eustachian tube. If it does not, then serous otitis media occurs. SIGNS AND SYMPTOMS   Hearing loss.  A feeling of fullness in the ear, without pain.  Young  children may not show any symptoms but may show slight behavioral changes, such as agitation, ear pulling, or crying. DIAGNOSIS  Serous otitis media is diagnosed by an ear exam. Tests may be done to check on the movement of the eardrum. Hearing exams may also be done. TREATMENT  The fluid most often goes away without treatment. If allergy is the cause, allergy treatment may be helpful. Fluid that persists for several months may require minor surgery. A small tube is placed in the eardrum to:  Drain the fluid.  Restore the air in the middle ear space. In certain situations, antibiotic medicines are used to avoid surgery. Surgery may be done to remove enlarged adenoids (if this is the cause). HOME CARE INSTRUCTIONS   Keep children away from tobacco smoke.  Keep all follow-up visits as directed by your health care provider. SEEK MEDICAL CARE IF:   Your hearing is not better in 3 months.  Your hearing is worse.  You have ear pain.  You have drainage from the ear.  You have dizziness.  You have serous otitis media only in one ear or have any bleeding from your nose (epistaxis).  You notice a lump on your neck. MAKE SURE YOU:  Understand these instructions.   Will watch your condition.   Will get help right away if you are not doing well or get worse.    This information is not intended to replace advice given to you by your health care provider.  Make sure you discuss any questions you have with your health care provider.   Document Released: 03/23/2003 Document Revised: 01/21/2014 Document Reviewed: 07/28/2012 Elsevier Interactive Patient Education 2016 [employer]      Upper Respiratory Infection, Adult Most upper respiratory infections (URIs) are a viral infection of the air passages leading to the lungs. A URI affects the nose, throat, and upper air passages. The most common type of URI is nasopharyngitis and is typically referred to as "the common cold." URIs run  their course and usually go away on their own. Most of the time, a URI does not require medical attention, but sometimes a bacterial infection in the upper airways can follow a viral infection. This is called a secondary infection. Sinus and middle ear infections are common types of secondary upper respiratory infections. Bacterial pneumonia can also complicate a URI. A URI can worsen asthma and chronic obstructive pulmonary disease (COPD). Sometimes, these complications can require emergency medical care and may be life threatening.  CAUSES Almost all URIs are caused by viruses. A virus is a type of germ and can spread from one person to another.  RISKS FACTORS You may be at risk for a URI if:   You smoke.   You have chronic heart or lung disease.  You have a weakened defense (immune) system.   You are very young or very old.   You have nasal allergies or asthma.  You work in crowded or poorly ventilated areas.  You work in health care facilities or schools. SIGNS AND SYMPTOMS  Symptoms typically develop 2-3 days after you come in contact with a cold virus. Most viral URIs last 7-10 days. However, viral URIs from the influenza virus (flu virus) can last 14-18 days and are typically more severe. Symptoms may include:   Runny or stuffy (congested) nose.   Sneezing.   Cough.   Sore throat.   Headache.   Fatigue.   Fever.   Loss of appetite.   Pain in your forehead, behind your eyes, and over your cheekbones (sinus pain).  Muscle aches.  DIAGNOSIS  Your health care provider may diagnose a URI by:  Physical exam.  Tests to check that your symptoms are not due to another condition such as:  Strep throat.  Sinusitis.  Pneumonia.  Asthma. TREATMENT  A URI goes away on its own with time. It cannot be cured with medicines, but medicines may be prescribed or recommended to relieve symptoms. Medicines may help:  Reduce your fever.  Reduce your  cough.  Relieve nasal congestion. HOME CARE INSTRUCTIONS   Take medicines only as directed by your health care provider.   Gargle warm saltwater or take cough drops to comfort your throat as directed by your health care provider.  Use a warm mist humidifier or inhale steam from a shower to increase air moisture. This may make it easier to breathe.  Drink enough fluid to keep your urine clear or pale yellow.   Eat soups and other clear broths and maintain good nutrition.   Rest as needed.   Return to work when your temperature has returned to normal or as your health care provider advises. You may need to stay home longer to avoid infecting others. You can also use a face mask and careful hand washing to prevent spread of the virus.  Increase the usage of your inhaler if you have asthma.   Do not use any tobacco products, including cigarettes, chewing tobacco, or electronic cigarettes. If  you need help quitting, ask your health care provider. PREVENTION  The best way to protect yourself from getting a cold is to practice good hygiene.   Avoid oral or hand contact with people with cold symptoms.   Wash your hands often if contact occurs.  There is no clear evidence that vitamin C, vitamin E, echinacea, or exercise reduces the chance of developing a cold. However, it is always recommended to get plenty of rest, exercise, and practice good nutrition.  SEEK MEDICAL CARE IF:   You are getting worse rather than better.   Your symptoms are not controlled by medicine.   You have chills.  You have worsening shortness of breath.  You have brown or red mucus.  You have yellow or brown nasal discharge.  You have pain in your face, especially when you bend forward.  You have a fever.  You have swollen neck glands.  You have pain while swallowing.  You have white areas in the back of your throat. SEEK IMMEDIATE MEDICAL CARE IF:   You have severe or  persistent:  Headache.  Ear pain.  Sinus pain.  Chest pain.  You have chronic lung disease and any of the following:  Wheezing.  Prolonged cough.  Coughing up blood.  A change in your usual mucus.  You have a stiff neck.  You have changes in your:  Vision.  Hearing.  Thinking.  Mood. MAKE SURE YOU:   Understand these instructions.  Will watch your condition.  Will get help right away if you are not doing well or get worse.   This information is not intended to replace advice given to you by your health care provider. Make sure you discuss any questions you have with your health care provider.   Document Released: 06/26/2000 Document Revised: 05/17/2014 Document Reviewed: 04/07/2013 Elsevier Interactive Patient Education 2016 [place].    Diarrhea Diarrhea is frequent loose and watery bowel movements. It can cause you to feel weak and dehydrated. Dehydration can cause you to become tired and thirsty, have a dry mouth, and have decreased urination that often is dark yellow. Diarrhea is a sign of another problem, most often an infection that will not last long. In most cases, diarrhea typically lasts 2-3 days. However, it can last longer if it is a sign of something more serious. It is important to treat your diarrhea as directed by your caregiver to lessen or prevent future episodes of diarrhea. CAUSES  Some common causes include:  Gastrointestinal infections caused by viruses, bacteria, or parasites.  Food poisoning or food allergies.  Certain medicines, such as antibiotics, chemotherapy, and laxatives.  Artificial sweeteners and fructose.  Digestive disorders. HOME CARE INSTRUCTIONS  Ensure adequate fluid intake (hydration): Have 1 cup (8 oz) of fluid for each diarrhea episode. Avoid fluids that contain simple sugars or sports drinks, fruit juices, whole milk products, and sodas. Your urine should be clear or pale yellow if you are drinking enough  fluids. Hydrate with an oral rehydration solution that you can purchase at pharmacies, retail stores, and online. You can prepare an oral rehydration solution at home by mixing the following ingredients together:   - tsp table salt.   tsp baking soda.   tsp salt substitute containing potassium chloride.  1  tablespoons sugar.  1 L (34 oz) of water.  Certain foods and beverages may increase the speed at which food moves through the gastrointestinal (GI) tract. These foods and beverages should be avoided and include:  Caffeinated and alcoholic beverages.  High-fiber foods, such as raw fruits and vegetables, nuts, seeds, and whole grain breads and cereals.  Foods and beverages sweetened with sugar alcohols, such as xylitol, sorbitol, and mannitol.  Some foods may be well tolerated and may help thicken stool including:  Starchy foods, such as rice, toast, pasta, low-sugar cereal, oatmeal, grits, baked potatoes, crackers, and bagels.  Bananas.  Applesauce.  Add probiotic-rich foods to help increase healthy bacteria in the GI tract, such as yogurt and fermented milk products.  Wash your hands well after each diarrhea episode.  Only take over-the-counter or prescription medicines as directed by your caregiver.  Take a warm bath to relieve any burning or pain from frequent diarrhea episodes. SEEK IMMEDIATE MEDICAL CARE IF:   You are unable to keep fluids down.  You have persistent vomiting.  You have blood in your stool, or your stools are black and tarry.  You do not urinate in 6-8 hours, or there is only a small amount of very dark urine.  You have abdominal pain that increases or localizes.  You have weakness, dizziness, confusion, or light-headedness.  You have a severe headache.  Your diarrhea gets worse or does not get better.  You have a fever or persistent symptoms for more than 2-3 days.  You have a fever and your symptoms suddenly get worse. MAKE SURE YOU:    Understand these instructions.  Will watch your condition.  Will get help right away if you are not doing well or get worse.   This information is not intended to replace advice given to you by your health care provider. Make sure you discuss any questions you have with your health care provider.   Document Released: 12/21/2001 Document Revised: 01/21/2014 Document Reviewed: 09/08/2011 Elsevier Interactive Patient Education 2016 Elsevier Inc.     Paresthesia Paresthesia is an abnormal burning or prickling sensation. This sensation is generally felt in the hands, arms, legs, or feet. However, it may occur in any part of the body. Usually, it is not painful. The feeling may be described as:  Tingling or numbness.  Pins and needles.  Skin crawling.  Buzzing.  Limbs falling asleep.  Itching. Most people experience temporary (transient) paresthesia at some time in their lives. Paresthesia may occur when you breathe too quickly (hyperventilation). It can also occur without any apparent cause. Commonly, paresthesia occurs when pressure is placed on a nerve. The sensation quickly goes away after the pressure is removed. For some people, however, paresthesia is a long-lasting (chronic) condition that is caused by an underlying disorder. If you continue to have paresthesia, you may need further medical evaluation. HOME CARE INSTRUCTIONS Watch your condition for any changes. Taking the following actions may help to lessen any discomfort that you are feeling:  Avoid drinking alcohol.  Try acupuncture or massage to help relieve your symptoms.  Keep all follow-up visits as directed by your health care provider. This is important. SEEK MEDICAL CARE IF:  You continue to have episodes of paresthesia.  Your burning or prickling feeling gets worse when you walk.  You have pain, cramps, or dizziness.  You develop a rash. SEEK IMMEDIATE MEDICAL CARE IF:  You feel weak.  You have  trouble walking or moving.  You have problems with speech, understanding, or vision.  You feel confused.  You cannot control your bladder or bowel movements.  You have numbness after an injury.  You faint.   This information is not intended to replace  advice given to you by your health care provider. Make sure you discuss any questions you have with your health care provider.   Document Released: 12/21/2001 Document Revised: 05/17/2014 Document Reviewed: 12/27/2013 Elsevier Interactive Patient Education 2016 [place].     Hyperglycemia Hyperglycemia occurs when the glucose (sugar) in your blood is too high. Hyperglycemia can happen for many reasons, but it most often happens to people who do not know they have diabetes or are not managing their diabetes properly.  CAUSES  Whether you have diabetes or not, there are other causes of hyperglycemia. Hyperglycemia can occur when you have diabetes, but it can also occur in other situations that you might not be as aware of, such as: Diabetes  If you have diabetes and are having problems controlling your blood glucose, hyperglycemia could occur because of some of the following reasons:  Not following your meal plan.  Not taking your diabetes medications or not taking it properly.  Exercising less or doing less activity than you normally do.  Being sick. Pre-diabetes  This cannot be ignored. Before people develop Type 2 diabetes, they almost always have "pre-diabetes." This is when your blood glucose levels are higher than normal, but not yet high enough to be diagnosed as diabetes. Research has shown that some long-term damage to the body, especially the heart and circulatory system, may already be occurring during pre-diabetes. If you take action to manage your blood glucose when you have pre-diabetes, you may delay or prevent Type 2 diabetes from developing. Stress  If you have diabetes, you may be "diet" controlled or on oral  medications or insulin to control your diabetes. However, you may find that your blood glucose is higher than usual in the hospital whether you have diabetes or not. This is often referred to as "stress hyperglycemia." Stress can elevate your blood glucose. This happens because of hormones put out by the body during times of stress. If stress has been the cause of your high blood glucose, it can be followed regularly by your caregiver. That way he/she can make sure your hyperglycemia does not continue to get worse or progress to diabetes. Steroids  Steroids are medications that act on the infection fighting system (immune system) to block inflammation or infection. One side effect can be a rise in blood glucose. Most people can produce enough extra insulin to allow for this rise, but for those who cannot, steroids make blood glucose levels go even higher. It is not unusual for steroid treatments to "uncover" diabetes that is developing. It is not always possible to determine if the hyperglycemia will go away after the steroids are stopped. A special blood test called an A1c is sometimes done to determine if your blood glucose was elevated before the steroids were started. SYMPTOMS  Thirsty.  Frequent urination.  Dry mouth.  Blurred vision.  Tired or fatigue.  Weakness.  Sleepy.  Tingling in feet or leg. DIAGNOSIS  Diagnosis is made by monitoring blood glucose in one or all of the following ways:  A1c test. This is a chemical found in your blood.  Fingerstick blood glucose monitoring.  Laboratory results. TREATMENT  First, knowing the cause of the hyperglycemia is important before the hyperglycemia can be treated. Treatment may include, but is not be limited to:  Education.  Change or adjustment in medications.  Change or adjustment in meal plan.  Treatment for an illness, infection, etc.  More frequent blood glucose monitoring.  Change in exercise plan.  Decreasing or  stopping steroids.  Lifestyle changes. HOME CARE INSTRUCTIONS   Test your blood glucose as directed.  Exercise regularly. Your caregiver will give you instructions about exercise. Pre-diabetes or diabetes which comes on with stress is helped by exercising.  Eat wholesome, balanced meals. Eat often and at regular, fixed times. Your caregiver or nutritionist will give you a meal plan to guide your sugar intake.  Being at an ideal weight is important. If needed, losing as little as 10 to 15 pounds may help improve blood glucose levels. SEEK MEDICAL CARE IF:   You have questions about medicine, activity, or diet.  You continue to have symptoms (problems such as increased thirst, urination, or weight gain). SEEK IMMEDIATE MEDICAL CARE IF:   You are vomiting or have diarrhea.  Your breath smells fruity.  You are breathing faster or slower.  You are very sleepy or incoherent.  You have numbness, tingling, or pain in your feet or hands.  You have chest pain.  Your symptoms get worse even though you have been following your caregiver's orders.  If you have any other questions or concerns.   This information is not intended to replace advice given to you by your health care provider. Make sure you discuss any questions you have with your health care provider.   Document Released: 06/26/2000 Document Revised: 03/25/2011 Document Reviewed: 09/06/2014 Elsevier Interactive Patient Education 2016 [employer].     Diabetes Mellitus and Food It is important for you to manage your blood sugar (glucose) level. Your blood glucose level can be greatly affected by what you eat. Eating healthier foods in the appropriate amounts throughout the day at about the same time each day will help you control your blood glucose level. It can also help slow or prevent worsening of your diabetes mellitus. Healthy eating may even help you improve the level of your blood pressure and reach or maintain a  healthy weight.  General recommendations for healthful eating and cooking habits include:  Eating meals and snacks regularly. Avoid going long periods of time without eating to lose weight.  Eating a diet that consists mainly of plant-based foods, such as fruits, vegetables, nuts, legumes, and whole grains.  Using low-heat cooking methods, such as baking, instead of high-heat cooking methods, such as deep frying. Work with your dietitian to make sure you understand how to use the Nutrition Facts information on food labels. HOW CAN FOOD AFFECT ME? Carbohydrates Carbohydrates affect your blood glucose level more than any other type of food. Your dietitian will help you determine how many carbohydrates to eat at each meal and teach you how to count carbohydrates. Counting carbohydrates is important to keep your blood glucose at a healthy level, especially if you are using insulin or taking certain medicines for diabetes mellitus. Alcohol Alcohol can cause sudden decreases in blood glucose (hypoglycemia), especially if you use insulin or take certain medicines for diabetes mellitus. Hypoglycemia can be a life-threatening condition. Symptoms of hypoglycemia (sleepiness, dizziness, and disorientation) are similar to symptoms of having too much alcohol.  If your health care provider has given you approval to drink alcohol, do so in moderation and use the following guidelines:  Women should not have more than one drink per day, and men should not have more than two drinks per day. One drink is equal to:  12 oz of beer.  5 oz of wine.  1 oz of hard liquor.  Do not drink on an empty stomach.  Keep  yourself hydrated. Have water, diet soda, or unsweetened iced tea.  Regular soda, juice, and other mixers might contain a lot of carbohydrates and should be counted. WHAT FOODS ARE NOT RECOMMENDED? As you make food choices, it is important to remember that all foods are not the same. Some foods have  fewer nutrients per serving than other foods, even though they might have the same number of calories or carbohydrates. It is difficult to get your body what it needs when you eat foods with fewer nutrients. Examples of foods that you should avoid that are high in calories and carbohydrates but low in nutrients include:  Trans fats (most processed foods list trans fats on the Nutrition Facts label).  Regular soda.  Juice.  Candy.  Sweets, such as cake, pie, doughnuts, and cookies.  Fried foods. WHAT FOODS CAN I EAT? Eat nutrient-rich foods, which will nourish your body and keep you healthy. The food you should eat also will depend on several factors, including:  The calories you need.  The medicines you take.  Your weight.  Your blood glucose level.  Your blood pressure level.  Your cholesterol level. You should eat a variety of foods, including:  Protein.  Lean cuts of meat.  Proteins low in saturated fats, such as fish, egg whites, and beans. Avoid processed meats.  Fruits and vegetables.  Fruits and vegetables that may help control blood glucose levels, such as apples, mangoes, and yams.  Dairy products.  Choose fat-free or low-fat dairy products, such as milk, yogurt, and cheese.  Grains, bread, pasta, and rice.  Choose whole grain products, such as multigrain bread, whole oats, and brown rice. These foods may help control blood pressure.  Fats.  Foods containing healthful fats, such as nuts, avocado, olive oil, canola oil, and fish. DOES EVERYONE WITH DIABETES MELLITUS HAVE THE SAME MEAL PLAN? Because every person with diabetes mellitus is different, there is not one meal plan that works for everyone. It is very important that you meet with a dietitian who will help you create a meal plan that is just right for you.   This information is not intended to replace advice given to you by your health care provider. Make sure you discuss any questions you have with  your health care provider.   Document Released: 09/27/2004 Document Revised: 01/21/2014 Document Reviewed: 11/27/2012 Elsevier Interactive Patient Education [employer].

## 2015-01-17 DIAGNOSIS — J209 Acute bronchitis, unspecified: Secondary | ICD-10-CM | POA: Diagnosis not present

## 2015-01-17 DIAGNOSIS — R202 Paresthesia of skin: Secondary | ICD-10-CM | POA: Diagnosis not present

## 2015-01-17 DIAGNOSIS — F17200 Nicotine dependence, unspecified, uncomplicated: Secondary | ICD-10-CM | POA: Diagnosis not present

## 2015-02-02 DIAGNOSIS — L570 Actinic keratosis: Secondary | ICD-10-CM | POA: Diagnosis not present

## 2015-02-02 DIAGNOSIS — D485 Neoplasm of uncertain behavior of skin: Secondary | ICD-10-CM | POA: Diagnosis not present

## 2015-02-16 DIAGNOSIS — C44319 Basal cell carcinoma of skin of other parts of face: Secondary | ICD-10-CM | POA: Diagnosis not present

## 2015-02-16 DIAGNOSIS — D485 Neoplasm of uncertain behavior of skin: Secondary | ICD-10-CM | POA: Diagnosis not present

## 2015-02-21 DIAGNOSIS — N183 Chronic kidney disease, stage 3 (moderate): Secondary | ICD-10-CM | POA: Diagnosis not present

## 2015-04-06 DIAGNOSIS — F419 Anxiety disorder, unspecified: Secondary | ICD-10-CM | POA: Diagnosis not present

## 2015-04-06 DIAGNOSIS — N183 Chronic kidney disease, stage 3 (moderate): Secondary | ICD-10-CM | POA: Diagnosis not present

## 2015-04-06 DIAGNOSIS — D72829 Elevated white blood cell count, unspecified: Secondary | ICD-10-CM | POA: Diagnosis not present

## 2015-04-06 DIAGNOSIS — E1165 Type 2 diabetes mellitus with hyperglycemia: Secondary | ICD-10-CM | POA: Diagnosis not present

## 2015-04-06 DIAGNOSIS — D751 Secondary polycythemia: Secondary | ICD-10-CM | POA: Diagnosis not present

## 2015-04-06 DIAGNOSIS — E785 Hyperlipidemia, unspecified: Secondary | ICD-10-CM | POA: Diagnosis not present

## 2015-04-06 DIAGNOSIS — E1121 Type 2 diabetes mellitus with diabetic nephropathy: Secondary | ICD-10-CM | POA: Diagnosis not present

## 2015-04-06 DIAGNOSIS — E1151 Type 2 diabetes mellitus with diabetic peripheral angiopathy without gangrene: Secondary | ICD-10-CM | POA: Diagnosis not present

## 2015-04-06 DIAGNOSIS — I129 Hypertensive chronic kidney disease with stage 1 through stage 4 chronic kidney disease, or unspecified chronic kidney disease: Secondary | ICD-10-CM | POA: Diagnosis not present

## 2015-04-06 DIAGNOSIS — Z7984 Long term (current) use of oral hypoglycemic drugs: Secondary | ICD-10-CM | POA: Diagnosis not present

## 2015-04-11 DIAGNOSIS — C44319 Basal cell carcinoma of skin of other parts of face: Secondary | ICD-10-CM | POA: Diagnosis not present

## 2015-06-13 DIAGNOSIS — L570 Actinic keratosis: Secondary | ICD-10-CM | POA: Diagnosis not present

## 2015-07-20 DIAGNOSIS — R011 Cardiac murmur, unspecified: Secondary | ICD-10-CM | POA: Diagnosis not present

## 2015-07-20 DIAGNOSIS — M25511 Pain in right shoulder: Secondary | ICD-10-CM | POA: Diagnosis not present

## 2015-07-20 DIAGNOSIS — E785 Hyperlipidemia, unspecified: Secondary | ICD-10-CM | POA: Diagnosis not present

## 2015-07-20 DIAGNOSIS — D72829 Elevated white blood cell count, unspecified: Secondary | ICD-10-CM | POA: Diagnosis not present

## 2015-07-20 DIAGNOSIS — I129 Hypertensive chronic kidney disease with stage 1 through stage 4 chronic kidney disease, or unspecified chronic kidney disease: Secondary | ICD-10-CM | POA: Diagnosis not present

## 2015-07-20 DIAGNOSIS — E1151 Type 2 diabetes mellitus with diabetic peripheral angiopathy without gangrene: Secondary | ICD-10-CM | POA: Diagnosis not present

## 2015-07-20 DIAGNOSIS — F411 Generalized anxiety disorder: Secondary | ICD-10-CM | POA: Diagnosis not present

## 2015-07-20 DIAGNOSIS — D751 Secondary polycythemia: Secondary | ICD-10-CM | POA: Diagnosis not present

## 2015-07-20 DIAGNOSIS — N183 Chronic kidney disease, stage 3 (moderate): Secondary | ICD-10-CM | POA: Diagnosis not present

## 2015-07-20 DIAGNOSIS — F17200 Nicotine dependence, unspecified, uncomplicated: Secondary | ICD-10-CM | POA: Diagnosis not present

## 2015-07-20 DIAGNOSIS — E1165 Type 2 diabetes mellitus with hyperglycemia: Secondary | ICD-10-CM | POA: Diagnosis not present

## 2015-07-20 DIAGNOSIS — E1121 Type 2 diabetes mellitus with diabetic nephropathy: Secondary | ICD-10-CM | POA: Diagnosis not present

## 2015-10-23 DIAGNOSIS — E1165 Type 2 diabetes mellitus with hyperglycemia: Secondary | ICD-10-CM | POA: Diagnosis not present

## 2015-10-23 DIAGNOSIS — F17200 Nicotine dependence, unspecified, uncomplicated: Secondary | ICD-10-CM | POA: Diagnosis not present

## 2015-10-23 DIAGNOSIS — E1121 Type 2 diabetes mellitus with diabetic nephropathy: Secondary | ICD-10-CM | POA: Diagnosis not present

## 2015-10-23 DIAGNOSIS — E1151 Type 2 diabetes mellitus with diabetic peripheral angiopathy without gangrene: Secondary | ICD-10-CM | POA: Diagnosis not present

## 2015-10-23 DIAGNOSIS — E785 Hyperlipidemia, unspecified: Secondary | ICD-10-CM | POA: Diagnosis not present

## 2015-10-23 DIAGNOSIS — Z125 Encounter for screening for malignant neoplasm of prostate: Secondary | ICD-10-CM | POA: Diagnosis not present

## 2015-10-23 DIAGNOSIS — R011 Cardiac murmur, unspecified: Secondary | ICD-10-CM | POA: Diagnosis not present

## 2015-10-23 DIAGNOSIS — I129 Hypertensive chronic kidney disease with stage 1 through stage 4 chronic kidney disease, or unspecified chronic kidney disease: Secondary | ICD-10-CM | POA: Diagnosis not present

## 2015-10-23 DIAGNOSIS — F419 Anxiety disorder, unspecified: Secondary | ICD-10-CM | POA: Diagnosis not present

## 2015-11-23 DIAGNOSIS — L570 Actinic keratosis: Secondary | ICD-10-CM | POA: Diagnosis not present

## 2016-01-23 DIAGNOSIS — E1121 Type 2 diabetes mellitus with diabetic nephropathy: Secondary | ICD-10-CM | POA: Diagnosis not present

## 2016-01-23 DIAGNOSIS — F419 Anxiety disorder, unspecified: Secondary | ICD-10-CM | POA: Diagnosis not present

## 2016-01-23 DIAGNOSIS — E1140 Type 2 diabetes mellitus with diabetic neuropathy, unspecified: Secondary | ICD-10-CM | POA: Diagnosis not present

## 2016-01-23 DIAGNOSIS — E785 Hyperlipidemia, unspecified: Secondary | ICD-10-CM | POA: Diagnosis not present

## 2016-01-23 DIAGNOSIS — M2550 Pain in unspecified joint: Secondary | ICD-10-CM | POA: Diagnosis not present

## 2016-01-23 DIAGNOSIS — E1165 Type 2 diabetes mellitus with hyperglycemia: Secondary | ICD-10-CM | POA: Diagnosis not present

## 2016-01-23 DIAGNOSIS — R011 Cardiac murmur, unspecified: Secondary | ICD-10-CM | POA: Diagnosis not present

## 2016-01-23 DIAGNOSIS — N183 Chronic kidney disease, stage 3 (moderate): Secondary | ICD-10-CM | POA: Diagnosis not present

## 2016-01-23 DIAGNOSIS — I129 Hypertensive chronic kidney disease with stage 1 through stage 4 chronic kidney disease, or unspecified chronic kidney disease: Secondary | ICD-10-CM | POA: Diagnosis not present

## 2016-01-23 DIAGNOSIS — E1151 Type 2 diabetes mellitus with diabetic peripheral angiopathy without gangrene: Secondary | ICD-10-CM | POA: Diagnosis not present

## 2016-03-25 DIAGNOSIS — R011 Cardiac murmur, unspecified: Secondary | ICD-10-CM | POA: Diagnosis not present

## 2016-04-22 DIAGNOSIS — E1140 Type 2 diabetes mellitus with diabetic neuropathy, unspecified: Secondary | ICD-10-CM | POA: Diagnosis not present

## 2016-04-22 DIAGNOSIS — Z1389 Encounter for screening for other disorder: Secondary | ICD-10-CM | POA: Diagnosis not present

## 2016-04-22 DIAGNOSIS — G4700 Insomnia, unspecified: Secondary | ICD-10-CM | POA: Diagnosis not present

## 2016-04-22 DIAGNOSIS — I129 Hypertensive chronic kidney disease with stage 1 through stage 4 chronic kidney disease, or unspecified chronic kidney disease: Secondary | ICD-10-CM | POA: Diagnosis not present

## 2016-04-22 DIAGNOSIS — E1121 Type 2 diabetes mellitus with diabetic nephropathy: Secondary | ICD-10-CM | POA: Diagnosis not present

## 2016-04-22 DIAGNOSIS — Z7984 Long term (current) use of oral hypoglycemic drugs: Secondary | ICD-10-CM | POA: Diagnosis not present

## 2016-04-22 DIAGNOSIS — E1151 Type 2 diabetes mellitus with diabetic peripheral angiopathy without gangrene: Secondary | ICD-10-CM | POA: Diagnosis not present

## 2016-04-22 DIAGNOSIS — F17200 Nicotine dependence, unspecified, uncomplicated: Secondary | ICD-10-CM | POA: Diagnosis not present

## 2016-04-22 DIAGNOSIS — F419 Anxiety disorder, unspecified: Secondary | ICD-10-CM | POA: Diagnosis not present

## 2016-04-22 DIAGNOSIS — N183 Chronic kidney disease, stage 3 (moderate): Secondary | ICD-10-CM | POA: Diagnosis not present

## 2016-04-22 DIAGNOSIS — E785 Hyperlipidemia, unspecified: Secondary | ICD-10-CM | POA: Diagnosis not present

## 2016-04-22 DIAGNOSIS — M25511 Pain in right shoulder: Secondary | ICD-10-CM | POA: Diagnosis not present

## 2016-07-22 DIAGNOSIS — M25511 Pain in right shoulder: Secondary | ICD-10-CM | POA: Diagnosis not present

## 2016-07-22 DIAGNOSIS — E1121 Type 2 diabetes mellitus with diabetic nephropathy: Secondary | ICD-10-CM | POA: Diagnosis not present

## 2016-07-22 DIAGNOSIS — M1611 Unilateral primary osteoarthritis, right hip: Secondary | ICD-10-CM | POA: Diagnosis not present

## 2016-07-22 DIAGNOSIS — E1151 Type 2 diabetes mellitus with diabetic peripheral angiopathy without gangrene: Secondary | ICD-10-CM | POA: Diagnosis not present

## 2016-07-22 DIAGNOSIS — F419 Anxiety disorder, unspecified: Secondary | ICD-10-CM | POA: Diagnosis not present

## 2016-07-22 DIAGNOSIS — N183 Chronic kidney disease, stage 3 (moderate): Secondary | ICD-10-CM | POA: Diagnosis not present

## 2016-07-22 DIAGNOSIS — D582 Other hemoglobinopathies: Secondary | ICD-10-CM | POA: Diagnosis not present

## 2016-07-22 DIAGNOSIS — E781 Pure hyperglyceridemia: Secondary | ICD-10-CM | POA: Diagnosis not present

## 2016-07-22 DIAGNOSIS — R748 Abnormal levels of other serum enzymes: Secondary | ICD-10-CM | POA: Diagnosis not present

## 2016-07-22 DIAGNOSIS — E1140 Type 2 diabetes mellitus with diabetic neuropathy, unspecified: Secondary | ICD-10-CM | POA: Diagnosis not present

## 2016-07-22 DIAGNOSIS — R0989 Other specified symptoms and signs involving the circulatory and respiratory systems: Secondary | ICD-10-CM | POA: Diagnosis not present

## 2016-07-22 DIAGNOSIS — I129 Hypertensive chronic kidney disease with stage 1 through stage 4 chronic kidney disease, or unspecified chronic kidney disease: Secondary | ICD-10-CM | POA: Diagnosis not present

## 2016-07-22 DIAGNOSIS — F17200 Nicotine dependence, unspecified, uncomplicated: Secondary | ICD-10-CM | POA: Diagnosis not present

## 2016-07-22 DIAGNOSIS — E785 Hyperlipidemia, unspecified: Secondary | ICD-10-CM | POA: Diagnosis not present

## 2016-07-26 ENCOUNTER — Other Ambulatory Visit: Payer: Self-pay | Admitting: Family Medicine

## 2016-07-26 DIAGNOSIS — R0989 Other specified symptoms and signs involving the circulatory and respiratory systems: Secondary | ICD-10-CM

## 2016-07-29 DIAGNOSIS — L570 Actinic keratosis: Secondary | ICD-10-CM | POA: Diagnosis not present

## 2016-07-29 DIAGNOSIS — D485 Neoplasm of uncertain behavior of skin: Secondary | ICD-10-CM | POA: Diagnosis not present

## 2016-07-29 DIAGNOSIS — Z85828 Personal history of other malignant neoplasm of skin: Secondary | ICD-10-CM | POA: Diagnosis not present

## 2016-07-29 DIAGNOSIS — C44119 Basal cell carcinoma of skin of left eyelid, including canthus: Secondary | ICD-10-CM | POA: Diagnosis not present

## 2016-07-31 ENCOUNTER — Ambulatory Visit
Admission: RE | Admit: 2016-07-31 | Discharge: 2016-07-31 | Disposition: A | Discharge status: Home / Self Care | Payer: Medicare Other | Source: Ambulatory Visit | Attending: Family Medicine | Admitting: Family Medicine

## 2016-07-31 DIAGNOSIS — I6523 Occlusion and stenosis of bilateral carotid arteries: Secondary | ICD-10-CM | POA: Diagnosis not present

## 2016-07-31 DIAGNOSIS — R0989 Other specified symptoms and signs involving the circulatory and respiratory systems: Secondary | ICD-10-CM

## 2016-10-07 DIAGNOSIS — C44119 Basal cell carcinoma of skin of left eyelid, including canthus: Secondary | ICD-10-CM | POA: Diagnosis not present

## 2016-10-29 DIAGNOSIS — I129 Hypertensive chronic kidney disease with stage 1 through stage 4 chronic kidney disease, or unspecified chronic kidney disease: Secondary | ICD-10-CM | POA: Diagnosis not present

## 2016-10-29 DIAGNOSIS — N183 Chronic kidney disease, stage 3 (moderate): Secondary | ICD-10-CM | POA: Diagnosis not present

## 2016-10-29 DIAGNOSIS — Z125 Encounter for screening for malignant neoplasm of prostate: Secondary | ICD-10-CM | POA: Diagnosis not present

## 2016-10-29 DIAGNOSIS — F321 Major depressive disorder, single episode, moderate: Secondary | ICD-10-CM | POA: Diagnosis not present

## 2016-10-29 DIAGNOSIS — E785 Hyperlipidemia, unspecified: Secondary | ICD-10-CM | POA: Diagnosis not present

## 2016-10-29 DIAGNOSIS — E1165 Type 2 diabetes mellitus with hyperglycemia: Secondary | ICD-10-CM | POA: Diagnosis not present

## 2016-10-29 DIAGNOSIS — D751 Secondary polycythemia: Secondary | ICD-10-CM | POA: Diagnosis not present

## 2016-10-29 DIAGNOSIS — M1611 Unilateral primary osteoarthritis, right hip: Secondary | ICD-10-CM | POA: Diagnosis not present

## 2016-10-29 DIAGNOSIS — L989 Disorder of the skin and subcutaneous tissue, unspecified: Secondary | ICD-10-CM | POA: Diagnosis not present

## 2016-10-29 DIAGNOSIS — F419 Anxiety disorder, unspecified: Secondary | ICD-10-CM | POA: Diagnosis not present

## 2016-10-29 DIAGNOSIS — E1121 Type 2 diabetes mellitus with diabetic nephropathy: Secondary | ICD-10-CM | POA: Diagnosis not present

## 2016-11-11 DIAGNOSIS — S01102A Unspecified open wound of left eyelid and periocular area, initial encounter: Secondary | ICD-10-CM | POA: Diagnosis not present

## 2016-12-02 DIAGNOSIS — G458 Other transient cerebral ischemic attacks and related syndromes: Secondary | ICD-10-CM | POA: Diagnosis not present

## 2016-12-02 DIAGNOSIS — I6529 Occlusion and stenosis of unspecified carotid artery: Secondary | ICD-10-CM | POA: Diagnosis not present

## 2016-12-02 DIAGNOSIS — I129 Hypertensive chronic kidney disease with stage 1 through stage 4 chronic kidney disease, or unspecified chronic kidney disease: Secondary | ICD-10-CM | POA: Diagnosis not present

## 2016-12-02 DIAGNOSIS — N183 Chronic kidney disease, stage 3 (moderate): Secondary | ICD-10-CM | POA: Diagnosis not present

## 2016-12-02 DIAGNOSIS — F419 Anxiety disorder, unspecified: Secondary | ICD-10-CM | POA: Diagnosis not present

## 2016-12-02 DIAGNOSIS — F17200 Nicotine dependence, unspecified, uncomplicated: Secondary | ICD-10-CM | POA: Diagnosis not present

## 2016-12-02 DIAGNOSIS — E1151 Type 2 diabetes mellitus with diabetic peripheral angiopathy without gangrene: Secondary | ICD-10-CM | POA: Diagnosis not present

## 2017-01-03 ENCOUNTER — Other Ambulatory Visit: Payer: Self-pay

## 2017-01-03 ENCOUNTER — Encounter (HOSPITAL_COMMUNITY): Payer: Self-pay | Admitting: *Deleted

## 2017-01-03 ENCOUNTER — Emergency Department (HOSPITAL_COMMUNITY)
Admission: EM | Admit: 2017-01-03 | Discharge: 2017-01-04 | Disposition: A | Discharge status: Home / Self Care | Payer: Medicare Other | Attending: Emergency Medicine | Admitting: Emergency Medicine

## 2017-01-03 ENCOUNTER — Emergency Department (HOSPITAL_COMMUNITY): Payer: Medicare Other

## 2017-01-03 DIAGNOSIS — Z7984 Long term (current) use of oral hypoglycemic drugs: Secondary | ICD-10-CM | POA: Diagnosis not present

## 2017-01-03 DIAGNOSIS — I10 Essential (primary) hypertension: Secondary | ICD-10-CM | POA: Insufficient documentation

## 2017-01-03 DIAGNOSIS — R404 Transient alteration of awareness: Secondary | ICD-10-CM | POA: Diagnosis not present

## 2017-01-03 DIAGNOSIS — R4182 Altered mental status, unspecified: Secondary | ICD-10-CM | POA: Diagnosis not present

## 2017-01-03 DIAGNOSIS — R42 Dizziness and giddiness: Secondary | ICD-10-CM | POA: Diagnosis not present

## 2017-01-03 DIAGNOSIS — Z7982 Long term (current) use of aspirin: Secondary | ICD-10-CM | POA: Diagnosis not present

## 2017-01-03 DIAGNOSIS — F1410 Cocaine abuse, uncomplicated: Secondary | ICD-10-CM | POA: Insufficient documentation

## 2017-01-03 DIAGNOSIS — E119 Type 2 diabetes mellitus without complications: Secondary | ICD-10-CM | POA: Insufficient documentation

## 2017-01-03 DIAGNOSIS — F17210 Nicotine dependence, cigarettes, uncomplicated: Secondary | ICD-10-CM | POA: Insufficient documentation

## 2017-01-03 DIAGNOSIS — Z79899 Other long term (current) drug therapy: Secondary | ICD-10-CM | POA: Diagnosis not present

## 2017-01-03 DIAGNOSIS — R9431 Abnormal electrocardiogram [ECG] [EKG]: Secondary | ICD-10-CM | POA: Diagnosis not present

## 2017-01-03 DIAGNOSIS — F19920 Other psychoactive substance use, unspecified with intoxication, uncomplicated: Secondary | ICD-10-CM | POA: Insufficient documentation

## 2017-01-03 LAB — CBC WITH DIFFERENTIAL/PLATELET
BASOS ABS: 0 10*3/uL (ref 0.0–0.1)
BASOS PCT: 0 %
EOS ABS: 0.1 10*3/uL (ref 0.0–0.7)
Eosinophils Relative: 1 %
HEMATOCRIT: 44.3 % (ref 39.0–52.0)
HEMOGLOBIN: 15.6 g/dL (ref 13.0–17.0)
Lymphocytes Relative: 22 %
Lymphs Abs: 1.8 10*3/uL (ref 0.7–4.0)
MCH: 33.4 pg (ref 26.0–34.0)
MCHC: 35.2 g/dL (ref 30.0–36.0)
MCV: 94.9 fL (ref 78.0–100.0)
MONO ABS: 0.3 10*3/uL (ref 0.1–1.0)
MONOS PCT: 4 %
NEUTROS ABS: 6 10*3/uL (ref 1.7–7.7)
NEUTROS PCT: 73 %
Platelets: 128 10*3/uL — ABNORMAL LOW (ref 150–400)
RBC: 4.67 MIL/uL (ref 4.22–5.81)
RDW: 14.3 % (ref 11.5–15.5)
WBC: 8.3 10*3/uL (ref 4.0–10.5)

## 2017-01-03 LAB — URINALYSIS, ROUTINE W REFLEX MICROSCOPIC
Bilirubin Urine: NEGATIVE
Glucose, UA: 50 mg/dL — AB
HGB URINE DIPSTICK: NEGATIVE
Ketones, ur: NEGATIVE mg/dL
LEUKOCYTES UA: NEGATIVE
NITRITE: NEGATIVE
PROTEIN: NEGATIVE mg/dL
SPECIFIC GRAVITY, URINE: 1.009 (ref 1.005–1.030)
pH: 6 (ref 5.0–8.0)

## 2017-01-03 LAB — SALICYLATE LEVEL: Salicylate Lvl: <7 mg/dL (ref 2.8–30.0)

## 2017-01-03 LAB — COMPREHENSIVE METABOLIC PANEL
ALBUMIN: 4.2 g/dL (ref 3.5–5.0)
ALT: 43 U/L (ref 17–63)
ANION GAP: 12 (ref 5–15)
AST: 40 U/L (ref 15–41)
Alkaline Phosphatase: 77 U/L (ref 38–126)
BILIRUBIN TOTAL: 1.2 mg/dL (ref 0.3–1.2)
BUN: 26 mg/dL — ABNORMAL HIGH (ref 6–20)
CHLORIDE: 100 mmol/L — AB (ref 101–111)
CO2: 22 mmol/L (ref 22–32)
Calcium: 9.6 mg/dL (ref 8.9–10.3)
Creatinine, Ser: 1.32 mg/dL — ABNORMAL HIGH (ref 0.61–1.24)
GFR calc Af Amer: >60 mL/min (ref >60)
GFR calc non Af Amer: 54 mL/min — ABNORMAL LOW (ref >60)
GLUCOSE: 134 mg/dL — AB (ref 65–99)
POTASSIUM: 5.2 mmol/L — AB (ref 3.5–5.1)
SODIUM: 134 mmol/L — AB (ref 135–145)
Total Protein: 6.9 g/dL (ref 6.5–8.1)

## 2017-01-03 LAB — RAPID URINE DRUG SCREEN, HOSP PERFORMED
AMPHETAMINES: NOT DETECTED
BARBITURATES: NOT DETECTED
Benzodiazepines: POSITIVE — AB
Cocaine: POSITIVE — AB
Opiates: NOT DETECTED
TETRAHYDROCANNABINOL: NOT DETECTED

## 2017-01-03 LAB — ACETAMINOPHEN LEVEL

## 2017-01-03 LAB — ETHANOL: Alcohol, Ethyl (B): <10 mg/dL (ref <10)

## 2017-01-03 LAB — CBG MONITORING, ED: Glucose-Capillary: 155 mg/dL — ABNORMAL HIGH (ref 65–99)

## 2017-01-03 MED ORDER — SODIUM CHLORIDE 0.9 % IV BOLUS (SEPSIS)
1000.0000 mL | Freq: Once | INTRAVENOUS | Status: AC
  Administered 2017-01-03: 1000 mL via INTRAVENOUS

## 2017-01-03 NOTE — ED Triage Notes (Signed)
Per EMS, pt from home. Woke up at 9:00 and was acting normal per family and at 9:30 was found to be altered. Pt is answering questions appropriately but is lethargic per EMS. Neg stroke screen per EMS.

## 2017-01-03 NOTE — ED Notes (Signed)
Pt unable to stand without assistance.

## 2017-01-03 NOTE — ED Triage Notes (Signed)
PT called Sister , who reporfted Pt took Xanax but family unsure how much

## 2017-01-03 NOTE — ED Provider Notes (Signed)
[facility] EMERGENCY DEPARTMENT Provider Note   CSN: [id number] Arrival date & time: 01/03/17  1030     History   Chief Complaint Chief Complaint  Patient presents with  . Drug Overdose  . Suicide Attempt   Level 5 caveat: Slurred speech and altered mental status  HPI Adrian Andrews is a 66 y.o. male.  HPI 66 year old male presents the emergency department altered mental status.  Family states they saw him at 9 AM and he was normal then around 30 he had slurred speech was stumbling.  He has no arm or leg weakness at this time.  He does have slurred speech.  He admits to taking several dose of Xanax this morning to help deal with increasing anxiety and stress.  He denies suicidal thoughts.  This was not a suicide attempt.  He is difficult to understand however secondary to his slurred speech.   Past Medical History:  Diagnosis Date  . Diabetes mellitus without complication ([place])   . Hyperlipidemia   . Hypertension     There are no active problems to display for this patient.   Past Surgical History:  Procedure Laterality Date  . stint         Home Medications    Prior to Admission medications   Medication Sig Start Date End Date Taking? Authorizing Provider  aspirin 81 MG tablet Take 81 mg by mouth daily.    [provider]  atenolol (TENORMIN) 50 MG tablet Take 50 mg by mouth 2 (two) times daily.    [provider]  atorvastatin (LIPITOR) 40 MG tablet Take 40 mg by mouth daily. Take 0.5 tablet each night    [provider]  cetirizine-pseudoephedrine (ZYRTEC-D) 5-120 MG tablet Take 1 tablet by mouth 2 (two) times daily as needed. 01/14/15   Lajean Saver, MD  gabapentin (NEURONTIN) 300 MG capsule Take 300 mg by mouth 2 (two) times daily.    [provider]  glipiZIDE (GLUCOTROL) 5 MG tablet Take 5 mg by mouth 2 (two) times daily before a meal.    [provider]  traMADol (ULTRAM) 50 MG tablet Take  50 mg by mouth 2 (two) times daily as needed for moderate pain (arthritis).    [provider]  triamcinolone cream (KENALOG) 0.1 % Apply 1 application topically 2 (two) times daily. 02/03/13   Posey Boyer, MD    Family History No family history on file.  Social History Social History   Tobacco Use  . Smoking status: Current Every Day Smoker    Packs/day: 0.80    Types: Cigarettes  Substance Use Topics  . Alcohol use: No  . Drug use: Not on file     Allergies   Clindamycin/lincomycin and Metformin and related   Review of Systems Review of Systems  Unable to perform ROS: Mental status change     Physical Exam Updated Vital Signs BP 130/71   Pulse 67   Temp (!) 97.5 F (36.4 C) (Rectal)   Resp 18   SpO2 99%   Physical Exam  Constitutional: He appears well-developed and well-nourished.  HENT:  Head: Normocephalic and atraumatic.  Eyes: EOM are normal.  Neck: Normal range of motion.  Cardiovascular: Normal rate, regular rhythm, normal heart sounds and intact distal pulses.  Pulmonary/Chest: Effort normal and breath sounds normal. No respiratory distress.  Abdominal: Soft. He exhibits no distension. There is no tenderness.  Musculoskeletal: Normal range of motion.  Neurological:  Opens eyes to  voice.  Localizes to pain.  Follows simple commands.  Answer simple questions.  Skin: Skin is warm and dry.  Psychiatric: He has a normal mood and affect. Judgment normal.  Nursing note and vitals reviewed.    ED Treatments / Results  Labs (all labs ordered are listed, but only abnormal results are displayed) Labs Reviewed  RAPID URINE DRUG SCREEN, HOSP PERFORMED - Abnormal; Notable for the following components:      Result Value   Cocaine POSITIVE (*)    Benzodiazepines POSITIVE (*)    All other components within normal limits  CBC WITH DIFFERENTIAL/PLATELET - Abnormal; Notable for the following components:   Platelets 128 (*)    All other components  within normal limits  COMPREHENSIVE METABOLIC PANEL - Abnormal; Notable for the following components:   Sodium 134 (*)    Potassium 5.2 (*)    Chloride 100 (*)    Glucose, Bld 134 (*)    BUN 26 (*)    Creatinine, Ser 1.32 (*)    GFR calc non Af Amer 54 (*)    All other components within normal limits  ACETAMINOPHEN LEVEL - Abnormal; Notable for the following components:   Acetaminophen (Tylenol), Serum <10 (*)    All other components within normal limits  URINALYSIS, ROUTINE W REFLEX MICROSCOPIC - Abnormal; Notable for the following components:   Glucose, UA 50 (*)    All other components within normal limits  CBG MONITORING, ED - Abnormal; Notable for the following components:   Glucose-Capillary 155 (*)    All other components within normal limits  ETHANOL  SALICYLATE LEVEL  AMMONIA    EKG  EKG Interpretation None       Radiology Ct Head Wo Contrast  Result Date: 01/03/2017 CLINICAL DATA:  66 year old with acute mental status changes that began abruptly at approximately 9:30 a.m., and the patient had been normal prior to that this morning. EXAM: CT HEAD WITHOUT CONTRAST TECHNIQUE: Contiguous axial images were obtained from the base of the skull through the vertex without intravenous contrast. COMPARISON:  01/14/2015. FINDINGS: Brain: Ventricular system normal in size and appearance for age. Mild cortical atrophy, unchanged. Mild cerebellar vermian atrophy, unchanged. No mass lesion. No midline shift. No acute hemorrhage or hematoma. No extra-axial fluid collections. No evidence of acute infarction. Vascular: Moderate bilateral carotid siphon atherosclerosis. No hyperdense vessel. Skull: No skull fracture or other focal osseous abnormality involving the skull. Sinuses/Orbits: Visualized paranasal sinuses, bilateral mastoid air cells and bilateral middle ear cavities well-aerated. Visualized orbits and globes normal. Other: None. IMPRESSION: 1. No acute intracranial abnormality. 2.  Stable mild age related cortical atrophy. Electronically Signed   By: Evangeline Dakin M.D.   On: 01/03/2017 13:52    Procedures Procedures (including critical care time)  Medications Ordered in ED Medications  sodium chloride 0.9 % bolus 1,000 mL (0 mLs Intravenous Stopped 01/03/17 1448)     Initial Impression / Assessment and Plan / ED Course  I have reviewed the triage vital signs and the nursing notes.  Pertinent labs & imaging results that were available during my care of the patient were reviewed by me and considered in my medical decision making (see chart for details).     4:31 PM Patient with improving mental status at this time.  He is still too sleepy to go home.  He will need more time to metabolize the Xanax.  This does not appear to be an intentional overdose as much as this is more missed use  of his Xanax.  Care transferred to Dr. Eulis Foster to reevaluate his mental status and discharge when clear.  Final Clinical Impressions(s) / ED Diagnoses   Final diagnoses:  None    ED Discharge Orders    None       Jola Schmidt, MD 01/03/17 [phone]

## 2017-01-03 NOTE — ED Triage Notes (Signed)
Pt reports he tried to OD and took HIS nerve because the could not take it any more.

## 2017-01-03 NOTE — ED Provider Notes (Signed)
The patient's ex-wife called back. Message had been left at 5 PM, on her home phone.  She lives across the street from the patient.  She states today he was "falling around, angry, and confused."  He was brought here by EMS.  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  They also believe that he is using illegal drugs.  Patient lives in a house with his sister Danton Clap, and according to his ex-wife, Skip Mayer, the patient's name is "on the De[place]."  The patient's sister is ill, and unable to manage him in the current state.  At this time (20:20 PM) the patient is more alert, still somewhat dysarthric, but speaks slowly.  He admitted to cocaine use when confronted.  He states that he is not suicidal and was not trying to kill himself earlier today.  He states that he does not have anywhere else to go, currently.  23:10 PM-ambulation trial, patient unable to ambulate without assistance.  23: 15 p.m.-discussed with the patient's ex-wife, who will check on him tomorrow morning.  She does not want to come and get him at this time.  Medical decision making-inappropriate use of medications likely benzodiazepine, associated with cocaine abuse.  Doubt suicidal attempt, or plan.  Doubt significant toxicity, however intoxication prevents discharge him at this time.  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  Plan-observe until sober and able to ambulate.   Daleen Bo, MD 01/03/17 [phone]

## 2017-01-04 NOTE — ED Notes (Signed)
Pt ambulated without assistance

## 2017-01-04 NOTE — ED Notes (Signed)
Patient given discharge instructions and verbalized understanding.  Patient stable to discharge at this time.  Patient is alert and oriented to baseline.  No distressed noted at this time.  All belongings taken with the patient at discharge.   

## 2017-01-04 NOTE — ED Provider Notes (Signed)
Patient signed out to me to evaluate in the morning.  He unintentionally overdosed on Xanax last night and was unable to ambulate.  He has been monitored through the night has done well.  This morning he is easily awakened and alert.  He is appropriate for discharge.   Orpah Greek, MD 01/04/17 [phone]

## 2017-01-24 DIAGNOSIS — F4321 Adjustment disorder with depressed mood: Secondary | ICD-10-CM | POA: Diagnosis not present

## 2017-01-24 DIAGNOSIS — T50904A Poisoning by unspecified drugs, medicaments and biological substances, undetermined, initial encounter: Secondary | ICD-10-CM | POA: Diagnosis not present

## 2017-01-24 DIAGNOSIS — F419 Anxiety disorder, unspecified: Secondary | ICD-10-CM | POA: Diagnosis not present

## 2017-01-24 DIAGNOSIS — R4781 Slurred speech: Secondary | ICD-10-CM | POA: Diagnosis not present

## 2017-01-29 ENCOUNTER — Other Ambulatory Visit: Payer: Self-pay | Admitting: Family Medicine

## 2017-01-29 DIAGNOSIS — R4781 Slurred speech: Secondary | ICD-10-CM

## 2017-02-13 ENCOUNTER — Ambulatory Visit
Admission: RE | Admit: 2017-02-13 | Discharge: 2017-02-13 | Disposition: A | Discharge status: Home / Self Care | Payer: Medicare Other | Source: Ambulatory Visit | Attending: Family Medicine | Admitting: Family Medicine

## 2017-02-13 ENCOUNTER — Other Ambulatory Visit: Payer: Self-pay | Admitting: Family Medicine

## 2017-02-13 ENCOUNTER — Other Ambulatory Visit: Payer: Self-pay

## 2017-02-13 DIAGNOSIS — R4781 Slurred speech: Secondary | ICD-10-CM | POA: Diagnosis not present

## 2017-02-13 DIAGNOSIS — I6523 Occlusion and stenosis of bilateral carotid arteries: Secondary | ICD-10-CM

## 2017-02-14 ENCOUNTER — Ambulatory Visit (INDEPENDENT_AMBULATORY_CARE_PROVIDER_SITE_OTHER): Payer: Medicare Other | Admitting: Vascular Surgery

## 2017-02-14 ENCOUNTER — Ambulatory Visit (HOSPITAL_COMMUNITY)
Admission: RE | Admit: 2017-02-14 | Discharge: 2017-02-14 | Disposition: A | Discharge status: Home / Self Care | Payer: Medicare Other | Source: Ambulatory Visit | Attending: Vascular Surgery | Admitting: Vascular Surgery

## 2017-02-14 ENCOUNTER — Other Ambulatory Visit: Payer: Self-pay | Admitting: *Deleted

## 2017-02-14 ENCOUNTER — Encounter: Payer: Self-pay | Admitting: *Deleted

## 2017-02-14 ENCOUNTER — Encounter: Payer: Self-pay | Admitting: Vascular Surgery

## 2017-02-14 VITALS — BP 120/78 | HR 61 | Temp 98.6°F | Resp 20 | Ht 68.0 in | Wt 151.1 lb

## 2017-02-14 DIAGNOSIS — I6522 Occlusion and stenosis of left carotid artery: Secondary | ICD-10-CM | POA: Diagnosis not present

## 2017-02-14 DIAGNOSIS — I6523 Occlusion and stenosis of bilateral carotid arteries: Secondary | ICD-10-CM | POA: Insufficient documentation

## 2017-02-14 LAB — VAS US CAROTID
LCCAPDIAS: 0 cm/s
LEFT ECA DIAS: 0 cm/s
LICADSYS: -43 cm/s
Left CCA dist dias: 0 cm/s
Left CCA dist sys: 70 cm/s
Left CCA prox sys: 95 cm/s
Left ICA dist dias: -11 cm/s
Left ICA prox dias: -92 cm/s
Left ICA prox sys: -556 cm/s
RCCADSYS: -156 cm/s
RCCAPDIAS: 14 cm/s
RIGHT CCA MID DIAS: 17 cm/s
RIGHT ECA DIAS: -4 cm/s
Right CCA prox sys: 118 cm/s

## 2017-02-14 NOTE — Progress Notes (Signed)
Patient ID: Adrian Andrews, male   DOB: 04-25-1950, 67 y.o.   MRN: [id number]  Reason for Consult: New Patient (Initial Visit) (eval bil carotid stenosis - ref by Adrian Andrews)   Referred by Adrian Stains, MD  Subjective:     HPI:  Adrian Andrews is a 67 y.o. male with a history per him of right lower extremity stenting.  This was performed several years ago by Dr. Donnetta Andrews.  Beginning of this year patient had an episode of confusion and altered speech was taken to the emergency department at the time he had a sister who is in hospice and he was very stressed out and possibly used cocaine and also took Xanax.  The time he had an MRI that demonstrated a left sided cerebral stroke.  He now presents for further evaluation of his high-grade stenosis of his left carotid artery.  He has not had any further episodes speech has mostly improved although he has had some occasional slurring.  He has no upper or lower extremity symptoms.  He does take aspirin daily.  He continues to smoke daily as well.  He is currently walking without any limitation.  Past Medical History:  Diagnosis Date  . Chronic kidney disease   . Diabetes mellitus without complication ([place])   . Hyperlipidemia   . Hypertension    History reviewed. No pertinent family history. History reviewed. No pertinent surgical history.  Short Social History:  Social History   Tobacco Use  . Smoking status: Current Every Day Smoker    Packs/day: 0.80    Years: 20.00    Pack years: 16.00    Types: Cigarettes  . Smokeless tobacco: Never Used  Substance Use Topics  . Alcohol use: No    Allergies  Allergen Reactions  . Clindamycin/Lincomycin     Throat tightens up  . Metformin And Related Other (See Comments)    Effected kidney function    Current Outpatient Medications  Medication Sig Dispense Refill  . aspirin EC 81 MG tablet Take 81 mg by mouth daily.    Marland Kitchen atenolol (TENORMIN) 50 MG tablet Take 50 mg by mouth 2 (two) times  daily.    Marland Kitchen atorvastatin (LIPITOR) 40 MG tablet Take 20 mg by mouth at bedtime.     . cetirizine-pseudoephedrine (ZYRTEC-D) 5-120 MG tablet Take 1 tablet by mouth 2 (two) times daily as needed. 20 tablet 0  . escitalopram (LEXAPRO) 10 MG tablet Take 10 mg by mouth See admin instructions. Order date 10/29/16 - take 1/2 tablet (5 mg) by mouth daily for 7 days, then take 1 tablet (10 mg) daily    . gabapentin (NEURONTIN) 300 MG capsule Take 300 mg by mouth at bedtime.     Marland Kitchen glipiZIDE (GLUCOTROL) 10 MG tablet Take 10 mg by mouth 2 (two) times daily.    . pioglitazone (ACTOS) 15 MG tablet Take 15 mg by mouth daily.    . traMADol (ULTRAM) 50 MG tablet Take 50 mg by mouth 4 (four) times daily as needed for moderate pain (arthritis).     . triamcinolone cream (KENALOG) 0.1 % Apply 1 application topically 2 (two) times daily. 30 g 0  . ALPRAZolam (XANAX) 1 MG tablet Take 1 mg by mouth.     No current facility-administered medications for this visit.     Review of Systems  Constitutional:  Constitutional negative. HENT: HENT negative.  Eyes: Eyes negative.  Respiratory: Respiratory negative.  Cardiovascular: Cardiovascular negative.  GI: Gastrointestinal negative.  Musculoskeletal: Musculoskeletal negative.  Skin: Skin negative.  Neurological: Positive for speech difficulty.  Hematologic: Hematologic/lymphatic negative.  Psychiatric: Positive for confusion.        Objective:  Objective   Vitals:   02/14/17 1147 02/14/17 1150  BP: (!) 148/72 120/78  Pulse: 61   Resp: 20   Temp: 98.6 F (37 C)   TempSrc: Oral   SpO2: 98%   Weight: 151 lb 1.6 oz (68.5 kg)   Height: 5\' 8"  (1.727 m)    Body mass index is 22.97 kg/m.  Physical Exam  Constitutional: He is oriented to person, place, and time. He appears well-developed.  HENT:  Head: Normocephalic.  Eyes: Pupils are equal, round, and reactive to light.  Neck: Normal range of motion.  Cardiovascular: Normal rate.  Pulses:       Carotid pulses are 2+ on the right side, and 2+ on the left side.      Femoral pulses are 2+ on the right side, and 2+ on the left side. Pulmonary/Chest: Effort normal.  Abdominal: Soft. He exhibits no mass.  Musculoskeletal: Normal range of motion. He exhibits no edema.  Neurological: He is alert and oriented to person, place, and time.  Skin: Skin is warm and dry.  Psychiatric: He has a normal mood and affect. His behavior is normal. Judgment and thought content normal.    Data:  MRI/MRA IMPRESSION: 1. Scattered small subacute appearing infarcts in the middle and posterior Left MCA territory. Associated hemosiderin. No mass effect. No acute or chronic ischemic changes elsewhere in the brain. 2. MRA reveals decreased flow signal in the Left ICA siphon compatible with a high-grade upstream stenosis (note a sonographic string sign stenosis was documented in the neck 6 months ago) but the Left ICA remains patent at this time. 3. MRA appearance of the distal left vertebral artery likely reflects the chronic Left Subclavian Steal Syndrome. 4. Study discussed by telephone with Dr. Caren Griffins Andrews on 02/13/2017 at 10:48 .   Carotid Duplex Right carotid demonstrates 60-79% stenosis with ICA PSVT 211.  I  Left carotid demonstrates similar stenosis but ICA velocity 556/92.  There is normal artery high in the ICA the lesion is approximately 3 cm long with bifurcation noted to be at the hyoid notch.        Assessment/Plan:     67 year old male here with what appears to be a symptomatic left internal carotid lesion.  I discussed with him the risk benefits and alternatives of proceeding with surgery versus stenting and he would like to have carotid endarterectomy with the goal of preventing further stroke symptoms and understands that this will not improve any of his current slurred speech that he has.  He is to continue aspirin.  He request to have Dr. Donnetta Andrews performed the surgery and we will  get that scheduled for him today.  Should he have any further signs or symptoms of stroke he needs to be seen emergently.     Waynetta Sandy MD [facility]

## 2017-02-14 NOTE — H&P (View-Only) (Signed)
Patient ID: Adrian Andrews, male   DOB: Apr 03, 1950, 67 y.o.   MRN: [id number]  Reason for Consult: New Patient (Initial Visit) (eval bil carotid stenosis - ref by [name])   Referred by [name], MD  Subjective:     HPI:  Adrian Andrews is a 67 y.o. male with a history per him of right lower extremity stenting.  This was performed several years ago by Dr. Donnetta Hutching.  Beginning of this year patient had an episode of confusion and altered speech was taken to the emergency department at the time he had a sister who is in hospice and he was very stressed out and possibly used cocaine and also took Xanax.  The time he had an MRI that demonstrated a left sided cerebral stroke.  He now presents for further evaluation of his high-grade stenosis of his left carotid artery.  He has not had any further episodes speech has mostly improved although he has had some occasional slurring.  He has no upper or lower extremity symptoms.  He does take aspirin daily.  He continues to smoke daily as well.  He is currently walking without any limitation.  Past Medical History:  Diagnosis Date  . Chronic kidney disease   . Diabetes mellitus without complication ([place])   . Hyperlipidemia   . Hypertension    History reviewed. No pertinent family history. History reviewed. No pertinent surgical history.  Short Social History:  Social History   Tobacco Use  . Smoking status: Current Every Day Smoker    Packs/day: 0.80    Years: 20.00    Pack years: 16.00    Types: Cigarettes  . Smokeless tobacco: Never Used  Substance Use Topics  . Alcohol use: No    Allergies  Allergen Reactions  . Clindamycin/Lincomycin     Throat tightens up  . Metformin And Related Other (See Comments)    Effected kidney function    Current Outpatient Medications  Medication Sig Dispense Refill  . aspirin EC 81 MG tablet Take 81 mg by mouth daily.    Marland Kitchen atenolol (TENORMIN) 50 MG tablet Take 50 mg by mouth 2 (two) times  daily.    Marland Kitchen atorvastatin (LIPITOR) 40 MG tablet Take 20 mg by mouth at bedtime.     . cetirizine-pseudoephedrine (ZYRTEC-D) 5-120 MG tablet Take 1 tablet by mouth 2 (two) times daily as needed. 20 tablet 0  . escitalopram (LEXAPRO) 10 MG tablet Take 10 mg by mouth See admin instructions. Order date 10/29/16 - take 1/2 tablet (5 mg) by mouth daily for 7 days, then take 1 tablet (10 mg) daily    . gabapentin (NEURONTIN) 300 MG capsule Take 300 mg by mouth at bedtime.     Marland Kitchen glipiZIDE (GLUCOTROL) 10 MG tablet Take 10 mg by mouth 2 (two) times daily.    . pioglitazone (ACTOS) 15 MG tablet Take 15 mg by mouth daily.    . traMADol (ULTRAM) 50 MG tablet Take 50 mg by mouth 4 (four) times daily as needed for moderate pain (arthritis).     . triamcinolone cream (KENALOG) 0.1 % Apply 1 application topically 2 (two) times daily. 30 g 0  . ALPRAZolam (XANAX) 1 MG tablet Take 1 mg by mouth.     No current facility-administered medications for this visit.     Review of Systems  Constitutional:  Constitutional negative. HENT: HENT negative.  Eyes: Eyes negative.  Respiratory: Respiratory negative.  Cardiovascular: Cardiovascular negative.  GI: Gastrointestinal negative.  Musculoskeletal: Musculoskeletal negative.  Skin: Skin negative.  Neurological: Positive for speech difficulty.  Hematologic: Hematologic/lymphatic negative.  Psychiatric: Positive for confusion.        Objective:  Objective   Vitals:   02/14/17 1147 02/14/17 1150  BP: (!) 148/72 120/78  Pulse: 61   Resp: 20   Temp: 98.6 F (37 C)   TempSrc: Oral   SpO2: 98%   Weight: 151 lb 1.6 oz (68.5 kg)   Height: 5\' 8"  (1.727 m)    Body mass index is 22.97 kg/m.  Physical Exam  Constitutional: He is oriented to person, place, and time. He appears well-developed.  HENT:  Head: Normocephalic.  Eyes: Pupils are equal, round, and reactive to light.  Neck: Normal range of motion.  Cardiovascular: Normal rate.  Pulses:       Carotid pulses are 2+ on the right side, and 2+ on the left side.      Femoral pulses are 2+ on the right side, and 2+ on the left side. Pulmonary/Chest: Effort normal.  Abdominal: Soft. He exhibits no mass.  Musculoskeletal: Normal range of motion. He exhibits no edema.  Neurological: He is alert and oriented to person, place, and time.  Skin: Skin is warm and dry.  Psychiatric: He has a normal mood and affect. His behavior is normal. Judgment and thought content normal.    Data:  MRI/MRA IMPRESSION: 1. Scattered small subacute appearing infarcts in the middle and posterior Left MCA territory. Associated hemosiderin. No mass effect. No acute or chronic ischemic changes elsewhere in the brain. 2. MRA reveals decreased flow signal in the Left ICA siphon compatible with a high-grade upstream stenosis (note a sonographic string sign stenosis was documented in the neck 6 months ago) but the Left ICA remains patent at this time. 3. MRA appearance of the distal left vertebral artery likely reflects the chronic Left Subclavian Steal Syndrome. 4. Study discussed by telephone with Dr. Caren Griffins WHITE on 02/13/2017 at 10:48 .   Carotid Duplex Right carotid demonstrates 60-79% stenosis with ICA PSVT 211.  I  Left carotid demonstrates similar stenosis but ICA velocity 556/92.  There is normal artery high in the ICA the lesion is approximately 3 cm long with bifurcation noted to be at the hyoid notch.        Assessment/Plan:     67 year old male here with what appears to be a symptomatic left internal carotid lesion.  I discussed with him the risk benefits and alternatives of proceeding with surgery versus stenting and he would like to have carotid endarterectomy with the goal of preventing further stroke symptoms and understands that this will not improve any of his current slurred speech that he has.  He is to continue aspirin.  He request to have Dr. Donnetta Hutching performed the surgery and we will  get that scheduled for him today.  Should he have any further signs or symptoms of stroke he needs to be seen emergently.     Waynetta Sandy MD [facility]

## 2017-02-17 ENCOUNTER — Telehealth: Payer: Self-pay | Admitting: *Deleted

## 2017-02-17 NOTE — Telephone Encounter (Signed)
Msg. On answering service. Called patient and left voice mail msg on his phone to continue low dose aspirin.

## 2017-02-19 ENCOUNTER — Other Ambulatory Visit: Payer: Self-pay

## 2017-02-19 ENCOUNTER — Encounter (HOSPITAL_COMMUNITY)
Admission: RE | Admit: 2017-02-19 | Discharge: 2017-02-19 | Disposition: A | Discharge status: Home / Self Care | Payer: Medicare Other | Source: Ambulatory Visit | Attending: Vascular Surgery | Admitting: Vascular Surgery

## 2017-02-19 ENCOUNTER — Encounter (HOSPITAL_COMMUNITY): Payer: Self-pay

## 2017-02-19 ENCOUNTER — Other Ambulatory Visit (HOSPITAL_COMMUNITY): Payer: Self-pay | Admitting: *Deleted

## 2017-02-19 HISTORY — DX: Cerebral infarction, unspecified: I63.9

## 2017-02-19 HISTORY — DX: Unspecified osteoarthritis, unspecified site: M19.90

## 2017-02-19 HISTORY — DX: Anxiety disorder, unspecified: F41.9

## 2017-02-19 HISTORY — DX: Peripheral vascular disease, unspecified: I73.9

## 2017-02-19 HISTORY — DX: Polyneuropathy, unspecified: G62.9

## 2017-02-19 HISTORY — DX: Dyspnea, unspecified: R06.00

## 2017-02-19 LAB — CBC
HEMATOCRIT: 43.2 % (ref 39.0–52.0)
HEMOGLOBIN: 15.8 g/dL (ref 13.0–17.0)
MCH: 36.2 pg — ABNORMAL HIGH (ref 26.0–34.0)
MCHC: 36.6 g/dL — ABNORMAL HIGH (ref 30.0–36.0)
MCV: 99.1 fL (ref 78.0–100.0)
Platelets: 203 10*3/uL (ref 150–400)
RBC: 4.36 MIL/uL (ref 4.22–5.81)
RDW: 14.7 % (ref 11.5–15.5)
WBC: 10.7 10*3/uL — AB (ref 4.0–10.5)

## 2017-02-19 LAB — GLUCOSE, CAPILLARY: Glucose-Capillary: 262 mg/dL — ABNORMAL HIGH (ref 65–99)

## 2017-02-19 LAB — URINALYSIS, ROUTINE W REFLEX MICROSCOPIC
BACTERIA UA: NONE SEEN
BILIRUBIN URINE: NEGATIVE
Glucose, UA: >=500 mg/dL — AB
Hgb urine dipstick: NEGATIVE
KETONES UR: NEGATIVE mg/dL
LEUKOCYTES UA: NEGATIVE
NITRITE: NEGATIVE
Protein, ur: 30 mg/dL — AB
SPECIFIC GRAVITY, URINE: 1.014 (ref 1.005–1.030)
Squamous Epithelial / LPF: NONE SEEN
WBC, UA: NONE SEEN WBC/hpf (ref 0–5)
pH: 6 (ref 5.0–8.0)

## 2017-02-19 LAB — HEMOGLOBIN A1C
Hgb A1c MFr Bld: 7.4 % — ABNORMAL HIGH (ref 4.8–5.6)
MEAN PLASMA GLUCOSE: 165.68 mg/dL

## 2017-02-19 LAB — COMPREHENSIVE METABOLIC PANEL
ALBUMIN: 4.4 g/dL (ref 3.5–5.0)
ALK PHOS: 87 U/L (ref 38–126)
ALT: 23 U/L (ref 17–63)
AST: 20 U/L (ref 15–41)
Anion gap: 11 (ref 5–15)
BILIRUBIN TOTAL: 0.9 mg/dL (ref 0.3–1.2)
BUN: 29 mg/dL — AB (ref 6–20)
CO2: 23 mmol/L (ref 22–32)
CREATININE: 1.15 mg/dL (ref 0.61–1.24)
Calcium: 10.1 mg/dL (ref 8.9–10.3)
Chloride: 98 mmol/L — ABNORMAL LOW (ref 101–111)
GFR calc Af Amer: >60 mL/min (ref >60)
GFR calc non Af Amer: >60 mL/min (ref >60)
GLUCOSE: 244 mg/dL — AB (ref 65–99)
POTASSIUM: 5.3 mmol/L — AB (ref 3.5–5.1)
Sodium: 132 mmol/L — ABNORMAL LOW (ref 135–145)
TOTAL PROTEIN: 7.1 g/dL (ref 6.5–8.1)

## 2017-02-19 LAB — APTT: APTT: 30 s (ref 24–36)

## 2017-02-19 LAB — PROTIME-INR
INR: 0.98
Prothrombin Time: 12.9 seconds (ref 11.4–15.2)

## 2017-02-19 LAB — SURGICAL PCR SCREEN
MRSA, PCR: NEGATIVE
Staphylococcus aureus: NEGATIVE

## 2017-02-19 NOTE — Progress Notes (Signed)
Notified Becky, RN at [facility] about pt's UA results. >500 glucose and Protein of 30.

## 2017-02-19 NOTE — Pre-Procedure Instructions (Signed)
Adrian Andrews  02/19/2017    Your procedure is scheduled on Friday, February 21, 2017 at 10:00 AM.   Report to [facility] Entrance "A" Admitting Office at 8:00 AM.   Call this number if you have problems the morning of surgery: [phone]   Questions prior to day of surgery, please call [phone] between 8 & 4 PM.   Remember:  Do not eat food or drink liquids after midnight Thursday, 02/20/17.  Take these medicines the morning of surgery with A SIP OF WATER: Aspirin, Atenolol (Tenormin), Sertraline (Zoloft), Tramadol - if needed Do not take Glipizide or Pioglitazone (Actos) morning of surgery.  Do not smoke 24 hours prior to surgery.   How to Manage Your Diabetes Before Surgery   Why is it important to control my blood sugar before and after surgery?   Improving blood sugar levels before and after surgery helps healing and can limit problems.  A way of improving blood sugar control is eating a healthy diet by:  - Eating less sugar and carbohydrates  - Increasing activity/exercise  - Talk with your doctor about reaching your blood sugar goals  High blood sugars (greater than 180 mg/dL) can raise your risk of infections and slow down your recovery so you will need to focus on controlling your diabetes during the weeks before surgery.  Make sure that the doctor who takes care of your diabetes knows about your planned surgery including the date and location.  How do I manage my blood sugars before surgery?   Check your blood sugar at least 4 times a day, 2 days before surgery to make sure that they are not too high or low.  Check your blood sugar the morning of your surgery when you wake up and every 2 hours until you get to the Short-Stay unit.  Treat a low blood sugar (less than 70 mg/dL) with 1/2 cup of clear juice (cranberry or apple), 4 glucose tablets, OR glucose gel.  Recheck blood sugar in 15 minutes after treatment (to make sure it is greater than 70  mg/dL).  If blood sugar is not greater than 70 mg/dL on re-check, call [phone] for further instructions.   Report your blood sugar to the Short-Stay nurse when you get to Short-Stay.  References:  [facility], 2007 "How to Manage your Diabetes Before and After Surgery".   Do not wear jewelry.  Do not wear lotions, powders, cologne or deodorant.  Men may shave face and neck.  Do not bring valuables to the hospital.  [facility] is not responsible for any belongings or valuables.  Contacts, dentures or bridgework may not be worn into surgery.  Leave your suitcase in the car.  After surgery it may be brought to your room.  For patients admitted to the hospital, discharge time will be determined by your treatment team.  [facility] - Preparing for Surgery  Before surgery, you can play an important role.  Because skin is not sterile, your skin needs to be as free of germs as possible.  You can reduce the number of germs on you skin by washing with CHG (chlorahexidine gluconate) soap before surgery.  CHG is an antiseptic cleaner which kills germs and bonds with the skin to continue killing germs even after washing.  Please DO NOT use if you have an allergy to CHG or antibacterial soaps.  If your skin becomes reddened/irritated stop using the CHG and inform your nurse when you  arrive at Short Stay.  Do not shave (including legs and underarms) for at least 48 hours prior to the first CHG shower.  You may shave your face.  Please follow these instructions carefully:   1.  Shower with CHG Soap the night before surgery and the                    morning of Surgery.  2.  If you choose to wash your hair, wash your hair first as usual with your       normal shampoo.  3.  After you shampoo, rinse your hair and body thoroughly to remove the shampoo.  4.  Use CHG as you would any other liquid soap.  You can apply chg directly       to the skin and wash gently with  scrungie or a clean washcloth.  5.  Apply the CHG Soap to your body ONLY FROM THE NECK DOWN.        Do not use on open wounds or open sores.  Avoid contact with your eyes, ears, mouth and genitals (private parts).  Wash genitals (private parts) with your normal soap.  6.  Wash thoroughly, paying special attention to the area where your surgery        will be performed.  7.  Thoroughly rinse your body with warm water from the neck down.  8.  DO NOT shower/wash with your normal soap after using and rinsing off       the CHG Soap.  9.  Pat yourself dry with a clean towel.            10.  Wear clean pajamas.            11.  Place clean sheets on your bed the night of your first shower and do not        sleep with pets.  Day of Surgery  Shower as above. Do not apply any lotions/deodorants the morning of surgery.  Please wear clean clothes to the hospital.   Please read over the fact sheets that you were given.

## 2017-02-19 NOTE — Progress Notes (Signed)
Anesthesia Chart Review:  Pt is a 67 year old male scheduled for L CEA on 02/21/2017 with Adrian Jews, MD  - PCP is Adrian Stains, MD  PMH includes:  Stroke (subacute scattered small infarcts per 02/13/17 MRI), HTN, DM, hyperlipidemia, CKD, carotid artery stenosis. Current smoker. BMI 23  - ED visit for overdose xanax, positive for cocaine on UDS  Medications include: ASA 81 mg, atenolol, Lipitor, glipizide, pioglitazone  BP (!) 164/77   Pulse 67   Temp 36.7 C   Resp 20   Ht 5\' 8"  (1.727 m)   Wt 151 lb 12.8 oz (68.9 kg)   SpO2 100%   BMI 23.08 kg/m   Preoperative labs reviewed.   - HbA1c 7.4, glucose 244  EKG 01/03/17: Sinus rhythm. Borderline intraventricular conduction delay  Carotid duplex 02/14/17:  - Right Carotid: Velocities in the right ICA are consistent with a 60-79% stenosis. - Left Carotid: Velocities in the left ICA are consistent with a 60-79% stenosis at the high end of the range. The ECA appears >50% stenosed. Stenosis range in the ICA may be underestimated. - Vertebrals: Right vertebral artery was patent with antegrade flow. Left vertebral artery flow is bidirectional. - Subclavians: Left subclavian artery flow was disturbed. Normal flow hemodynamics were seen in the right subclavian artery.  MRI/MRA brain 02/13/17:  1. Scattered small subacute appearing infarcts in the middle and posterior Left MCA territory. Associated hemosiderin. No mass effect. No acute or chronic ischemic changes elsewhere in the brain. 2. MRA reveals decreased flow signal in the Left ICA siphon compatible with a high-grade upstream stenosis (note a sonographic string sign stenosis was documented in the neck 6 months ago) but the Left ICA remains patent at this time. 3. MRA appearance of the distal left vertebral artery likely reflects the chronic Left Subclavian Steal Syndrome.  If no changes, I anticipate pt can proceed with surgery as scheduled.   Willeen Cass, FNP-BC [facility] Short Stay  Surgical Center/Anesthesiology Phone: [phone] 02/19/2017 2:50 PM

## 2017-02-19 NOTE — Progress Notes (Signed)
Pt denies cardiac history, chest pain or sob. Pt is a type 2 diabetic. Pt unsure of when or what his A1C was. Pt states he does not check a fasting blood sugar, checks it in the evening. I called Dr. Orest Dikes office and his last A1C was 7.4 in Oct. 2018.

## 2017-02-21 ENCOUNTER — Inpatient Hospital Stay (HOSPITAL_COMMUNITY): Payer: Medicare Other | Admitting: Anesthesiology

## 2017-02-21 ENCOUNTER — Inpatient Hospital Stay (HOSPITAL_COMMUNITY)
Admission: RE | Admit: 2017-02-21 | Discharge: 2017-02-22 | DRG: 039 | Disposition: A | Discharge status: Home / Self Care | Payer: Medicare Other | Source: Ambulatory Visit | Attending: Vascular Surgery | Admitting: Vascular Surgery

## 2017-02-21 ENCOUNTER — Encounter (HOSPITAL_COMMUNITY)
Admission: RE | Disposition: A | Discharge status: Home / Self Care | Payer: Self-pay | Source: Ambulatory Visit | Attending: Vascular Surgery

## 2017-02-21 ENCOUNTER — Inpatient Hospital Stay (HOSPITAL_COMMUNITY): Payer: Medicare Other | Admitting: Emergency Medicine

## 2017-02-21 ENCOUNTER — Telehealth: Payer: Self-pay | Admitting: Vascular Surgery

## 2017-02-21 ENCOUNTER — Other Ambulatory Visit: Payer: Self-pay

## 2017-02-21 ENCOUNTER — Encounter (HOSPITAL_COMMUNITY): Payer: Self-pay

## 2017-02-21 DIAGNOSIS — I9581 Postprocedural hypotension: Secondary | ICD-10-CM | POA: Diagnosis not present

## 2017-02-21 DIAGNOSIS — Z881 Allergy status to other antibiotic agents status: Secondary | ICD-10-CM | POA: Diagnosis not present

## 2017-02-21 DIAGNOSIS — Z7984 Long term (current) use of oral hypoglycemic drugs: Secondary | ICD-10-CM

## 2017-02-21 DIAGNOSIS — F17210 Nicotine dependence, cigarettes, uncomplicated: Secondary | ICD-10-CM | POA: Diagnosis present

## 2017-02-21 DIAGNOSIS — E1142 Type 2 diabetes mellitus with diabetic polyneuropathy: Secondary | ICD-10-CM | POA: Diagnosis present

## 2017-02-21 DIAGNOSIS — I129 Hypertensive chronic kidney disease with stage 1 through stage 4 chronic kidney disease, or unspecified chronic kidney disease: Secondary | ICD-10-CM | POA: Diagnosis present

## 2017-02-21 DIAGNOSIS — Z7982 Long term (current) use of aspirin: Secondary | ICD-10-CM | POA: Diagnosis not present

## 2017-02-21 DIAGNOSIS — I69328 Other speech and language deficits following cerebral infarction: Secondary | ICD-10-CM

## 2017-02-21 DIAGNOSIS — E785 Hyperlipidemia, unspecified: Secondary | ICD-10-CM | POA: Diagnosis present

## 2017-02-21 DIAGNOSIS — Z95820 Peripheral vascular angioplasty status with implants and grafts: Secondary | ICD-10-CM | POA: Diagnosis not present

## 2017-02-21 DIAGNOSIS — I10 Essential (primary) hypertension: Secondary | ICD-10-CM | POA: Diagnosis not present

## 2017-02-21 DIAGNOSIS — E1122 Type 2 diabetes mellitus with diabetic chronic kidney disease: Secondary | ICD-10-CM | POA: Diagnosis present

## 2017-02-21 DIAGNOSIS — F419 Anxiety disorder, unspecified: Secondary | ICD-10-CM | POA: Diagnosis present

## 2017-02-21 DIAGNOSIS — E1151 Type 2 diabetes mellitus with diabetic peripheral angiopathy without gangrene: Secondary | ICD-10-CM | POA: Diagnosis present

## 2017-02-21 DIAGNOSIS — N189 Chronic kidney disease, unspecified: Secondary | ICD-10-CM | POA: Diagnosis present

## 2017-02-21 DIAGNOSIS — E119 Type 2 diabetes mellitus without complications: Secondary | ICD-10-CM | POA: Diagnosis not present

## 2017-02-21 DIAGNOSIS — T300 Burn of unspecified body region, unspecified degree: Secondary | ICD-10-CM

## 2017-02-21 DIAGNOSIS — Z79899 Other long term (current) drug therapy: Secondary | ICD-10-CM

## 2017-02-21 DIAGNOSIS — I6522 Occlusion and stenosis of left carotid artery: Secondary | ICD-10-CM | POA: Diagnosis not present

## 2017-02-21 DIAGNOSIS — I739 Peripheral vascular disease, unspecified: Secondary | ICD-10-CM | POA: Diagnosis present

## 2017-02-21 DIAGNOSIS — Z888 Allergy status to other drugs, medicaments and biological substances status: Secondary | ICD-10-CM | POA: Diagnosis not present

## 2017-02-21 DIAGNOSIS — M1990 Unspecified osteoarthritis, unspecified site: Secondary | ICD-10-CM | POA: Diagnosis not present

## 2017-02-21 HISTORY — PX: ENDARTERECTOMY: SHX5162

## 2017-02-21 HISTORY — PX: PATCH ANGIOPLASTY: SHX6230

## 2017-02-21 LAB — GLUCOSE, CAPILLARY
GLUCOSE-CAPILLARY: 188 mg/dL — AB (ref 65–99)
Glucose-Capillary: 221 mg/dL — ABNORMAL HIGH (ref 65–99)
Glucose-Capillary: 207 mg/dL — ABNORMAL HIGH (ref 65–99)
Glucose-Capillary: 317 mg/dL — ABNORMAL HIGH (ref 65–99)

## 2017-02-21 SURGERY — ENDARTERECTOMY, CAROTID
Anesthesia: General | Site: Neck | Laterality: Left

## 2017-02-21 MED ORDER — POLYETHYLENE GLYCOL 3350 17 G PO PACK: 17.0000 g | PACK | Freq: Every day | ORAL | Status: DC | PRN

## 2017-02-21 MED ORDER — DEXTROSE 5 % IV SOLN
INTRAVENOUS | Status: DC | PRN
  Administered 2017-02-21: 40 ug/min via INTRAVENOUS

## 2017-02-21 MED ORDER — PROTAMINE SULFATE 10 MG/ML IV SOLN
INTRAVENOUS | Status: AC
  Filled 2017-02-21: qty 5

## 2017-02-21 MED ORDER — LACTATED RINGERS IV SOLN
INTRAVENOUS | Status: DC
  Administered 2017-02-21 (×2): via INTRAVENOUS

## 2017-02-21 MED ORDER — HYDRALAZINE HCL 20 MG/ML IJ SOLN: 5.0000 mg | INTRAMUSCULAR | Status: DC | PRN

## 2017-02-21 MED ORDER — PHENOL 1.4 % MT LIQD: 1.0000 | OROMUCOSAL | Status: DC | PRN

## 2017-02-21 MED ORDER — PROPOFOL 10 MG/ML IV BOLUS
INTRAVENOUS | Status: DC | PRN
  Administered 2017-02-21: 50 mg via INTRAVENOUS
  Administered 2017-02-21: 120 mg via INTRAVENOUS

## 2017-02-21 MED ORDER — ALPRAZOLAM 0.25 MG PO TABS
0.2500 mg | ORAL_TABLET | Freq: Three times a day (TID) | ORAL | Status: DC | PRN
  Administered 2017-02-21 – 2017-02-22 (×2): 0.25 mg via ORAL
  Filled 2017-02-21 (×2): qty 1

## 2017-02-21 MED ORDER — SODIUM CHLORIDE 0.9 % IV SOLN: INTRAVENOUS | Status: DC

## 2017-02-21 MED ORDER — POTASSIUM CHLORIDE CRYS ER 20 MEQ PO TBCR: 20.0000 meq | EXTENDED_RELEASE_TABLET | Freq: Every day | ORAL | Status: DC | PRN

## 2017-02-21 MED ORDER — DOPAMINE-DEXTROSE 3.2-5 MG/ML-% IV SOLN
INTRAVENOUS | Status: AC
  Filled 2017-02-21: qty 250

## 2017-02-21 MED ORDER — MIDAZOLAM HCL 2 MG/2ML IJ SOLN
INTRAMUSCULAR | Status: AC
  Filled 2017-02-21: qty 2

## 2017-02-21 MED ORDER — INSULIN ASPART 100 UNIT/ML ~~LOC~~ SOLN
0.0000 [IU] | Freq: Three times a day (TID) | SUBCUTANEOUS | Status: DC
  Administered 2017-02-22: 5 [IU] via SUBCUTANEOUS

## 2017-02-21 MED ORDER — 0.9 % SODIUM CHLORIDE (POUR BTL) OPTIME
TOPICAL | Status: DC | PRN
  Administered 2017-02-21: 1000 mL

## 2017-02-21 MED ORDER — LABETALOL HCL 5 MG/ML IV SOLN: 10.0000 mg | INTRAVENOUS | Status: DC | PRN

## 2017-02-21 MED ORDER — ALBUMIN HUMAN 5 % IV SOLN
12.5000 g | Freq: Once | INTRAVENOUS | Status: AC
Start: 2017-02-21 — End: 2017-02-21
  Administered 2017-02-21: 12.5 g via INTRAVENOUS

## 2017-02-21 MED ORDER — SODIUM CHLORIDE 0.9 % IV SOLN
INTRAVENOUS | Status: DC | PRN
  Administered 2017-02-21: 11:00:00

## 2017-02-21 MED ORDER — ROCURONIUM BROMIDE 10 MG/ML (PF) SYRINGE
PREFILLED_SYRINGE | INTRAVENOUS | Status: DC | PRN
  Administered 2017-02-21: 30 mg via INTRAVENOUS
  Administered 2017-02-21: 50 mg via INTRAVENOUS
  Administered 2017-02-21: 20 mg via INTRAVENOUS

## 2017-02-21 MED ORDER — MAGNESIUM SULFATE 2 GM/50ML IV SOLN: 2.0000 g | Freq: Every day | INTRAVENOUS | Status: DC | PRN

## 2017-02-21 MED ORDER — HYDROMORPHONE HCL 1 MG/ML IJ SOLN
INTRAMUSCULAR | Status: AC
  Filled 2017-02-21: qty 1

## 2017-02-21 MED ORDER — PROPOFOL 10 MG/ML IV BOLUS
INTRAVENOUS | Status: AC
  Filled 2017-02-21: qty 20

## 2017-02-21 MED ORDER — ALBUMIN HUMAN 5 % IV SOLN
INTRAVENOUS | Status: AC
  Filled 2017-02-21: qty 250

## 2017-02-21 MED ORDER — DOPAMINE-DEXTROSE 3.2-5 MG/ML-% IV SOLN
2.0000 ug/kg/min | INTRAVENOUS | Status: DC
  Administered 2017-02-21: 2.5 ug/kg/min via INTRAVENOUS
  Administered 2017-02-21: 2 ug/kg/min via INTRAVENOUS

## 2017-02-21 MED ORDER — SODIUM CHLORIDE 0.9 % IV SOLN
500.0000 mL | Freq: Once | INTRAVENOUS | Status: AC | PRN
  Administered 2017-02-21 (×2): 500 mL via INTRAVENOUS

## 2017-02-21 MED ORDER — LIDOCAINE 2% (20 MG/ML) 5 ML SYRINGE
INTRAMUSCULAR | Status: AC
  Filled 2017-02-21: qty 5

## 2017-02-21 MED ORDER — TRAMADOL HCL 50 MG PO TABS
50.0000 mg | ORAL_TABLET | Freq: Four times a day (QID) | ORAL | Status: DC | PRN
  Administered 2017-02-21: 50 mg via ORAL
  Filled 2017-02-21: qty 1

## 2017-02-21 MED ORDER — HYDROMORPHONE HCL 1 MG/ML IJ SOLN
0.2500 mg | INTRAMUSCULAR | Status: DC | PRN
  Administered 2017-02-21 (×2): 0.25 mg via INTRAVENOUS

## 2017-02-21 MED ORDER — CHLORHEXIDINE GLUCONATE 4 % EX LIQD: 60.0000 mL | Freq: Once | CUTANEOUS | Status: DC

## 2017-02-21 MED ORDER — ROCURONIUM BROMIDE 10 MG/ML (PF) SYRINGE
PREFILLED_SYRINGE | INTRAVENOUS | Status: AC
  Filled 2017-02-21: qty 5

## 2017-02-21 MED ORDER — EPHEDRINE SULFATE-NACL 50-0.9 MG/10ML-% IV SOSY
PREFILLED_SYRINGE | INTRAVENOUS | Status: DC | PRN
  Administered 2017-02-21 (×2): 5 mg via INTRAVENOUS

## 2017-02-21 MED ORDER — DEXAMETHASONE SODIUM PHOSPHATE 10 MG/ML IJ SOLN
INTRAMUSCULAR | Status: DC | PRN
  Administered 2017-02-21: 5 mg via INTRAVENOUS

## 2017-02-21 MED ORDER — TRAMADOL HCL 50 MG PO TABS: 50.0000 mg | ORAL_TABLET | Freq: Three times a day (TID) | ORAL | 0 refills | Status: DC | PRN

## 2017-02-21 MED ORDER — BISACODYL 10 MG RE SUPP: 10.0000 mg | Freq: Every day | RECTAL | Status: DC | PRN

## 2017-02-21 MED ORDER — DOPAMINE-DEXTROSE 3.2-5 MG/ML-% IV SOLN: 2.0000 ug/kg/min | INTRAVENOUS | Status: DC

## 2017-02-21 MED ORDER — PROTAMINE SULFATE 10 MG/ML IV SOLN
INTRAVENOUS | Status: DC | PRN
  Administered 2017-02-21: 50 mg via INTRAVENOUS

## 2017-02-21 MED ORDER — METOPROLOL TARTRATE 5 MG/5ML IV SOLN: 2.0000 mg | INTRAVENOUS | Status: DC | PRN

## 2017-02-21 MED ORDER — ONDANSETRON HCL 4 MG/2ML IJ SOLN
INTRAMUSCULAR | Status: AC
  Filled 2017-02-21: qty 2

## 2017-02-21 MED ORDER — ONDANSETRON HCL 4 MG/2ML IJ SOLN
INTRAMUSCULAR | Status: DC | PRN
  Administered 2017-02-21: 4 mg via INTRAVENOUS

## 2017-02-21 MED ORDER — DEXTROSE 5 % IV SOLN
1.5000 g | INTRAVENOUS | Status: AC
  Administered 2017-02-21: 1.5 g via INTRAVENOUS
  Filled 2017-02-21: qty 1.5

## 2017-02-21 MED ORDER — DOCUSATE SODIUM 100 MG PO CAPS
100.0000 mg | ORAL_CAPSULE | Freq: Every day | ORAL | Status: DC
  Administered 2017-02-22: 100 mg via ORAL
  Filled 2017-02-21: qty 1

## 2017-02-21 MED ORDER — DEXAMETHASONE SODIUM PHOSPHATE 10 MG/ML IJ SOLN
INTRAMUSCULAR | Status: AC
  Filled 2017-02-21: qty 1

## 2017-02-21 MED ORDER — GABAPENTIN 300 MG PO CAPS
300.0000 mg | ORAL_CAPSULE | Freq: Every day | ORAL | Status: DC
  Administered 2017-02-21: 300 mg via ORAL
  Filled 2017-02-21: qty 1

## 2017-02-21 MED ORDER — LIDOCAINE HCL 1 % IJ SOLN
INTRAMUSCULAR | Status: AC
  Filled 2017-02-21: qty 20

## 2017-02-21 MED ORDER — LIDOCAINE 2% (20 MG/ML) 5 ML SYRINGE
INTRAMUSCULAR | Status: DC | PRN
  Administered 2017-02-21 (×2): 60 mg via INTRAVENOUS

## 2017-02-21 MED ORDER — GUAIFENESIN-DM 100-10 MG/5ML PO SYRP: 15.0000 mL | ORAL_SOLUTION | ORAL | Status: DC | PRN

## 2017-02-21 MED ORDER — ACETAMINOPHEN 325 MG PO TABS
325.0000 mg | ORAL_TABLET | ORAL | Status: DC | PRN
  Filled 2017-02-21: qty 2

## 2017-02-21 MED ORDER — PHENYLEPHRINE 40 MCG/ML (10ML) SYRINGE FOR IV PUSH (FOR BLOOD PRESSURE SUPPORT)
PREFILLED_SYRINGE | INTRAVENOUS | Status: DC | PRN
  Administered 2017-02-21 (×3): 80 ug via INTRAVENOUS
  Administered 2017-02-21: 20 ug via INTRAVENOUS
  Administered 2017-02-21: 40 ug via INTRAVENOUS
  Administered 2017-02-21: 20 ug via INTRAVENOUS

## 2017-02-21 MED ORDER — FENTANYL CITRATE (PF) 250 MCG/5ML IJ SOLN
INTRAMUSCULAR | Status: AC
  Filled 2017-02-21: qty 5

## 2017-02-21 MED ORDER — ACETAMINOPHEN 650 MG RE SUPP: 325.0000 mg | RECTAL | Status: DC | PRN

## 2017-02-21 MED ORDER — GLIPIZIDE 10 MG PO TABS
10.0000 mg | ORAL_TABLET | Freq: Two times a day (BID) | ORAL | Status: DC
  Administered 2017-02-21 – 2017-02-22 (×2): 10 mg via ORAL
  Filled 2017-02-21 (×2): qty 1

## 2017-02-21 MED ORDER — HEPARIN SODIUM (PORCINE) 1000 UNIT/ML IJ SOLN
INTRAMUSCULAR | Status: DC | PRN
  Administered 2017-02-21: 7000 [IU] via INTRAVENOUS

## 2017-02-21 MED ORDER — SUGAMMADEX SODIUM 200 MG/2ML IV SOLN
INTRAVENOUS | Status: AC
  Filled 2017-02-21: qty 2

## 2017-02-21 MED ORDER — MIDAZOLAM HCL 5 MG/5ML IJ SOLN
INTRAMUSCULAR | Status: DC | PRN
  Administered 2017-02-21: 2 mg via INTRAVENOUS

## 2017-02-21 MED ORDER — PANTOPRAZOLE SODIUM 40 MG PO TBEC
40.0000 mg | DELAYED_RELEASE_TABLET | Freq: Every day | ORAL | Status: DC
  Administered 2017-02-22: 40 mg via ORAL
  Filled 2017-02-21: qty 1

## 2017-02-21 MED ORDER — SUGAMMADEX SODIUM 200 MG/2ML IV SOLN
INTRAVENOUS | Status: DC | PRN
  Administered 2017-02-21: 137 mg via INTRAVENOUS

## 2017-02-21 MED ORDER — ALUM & MAG HYDROXIDE-SIMETH 200-200-20 MG/5ML PO SUSP
15.0000 mL | ORAL | Status: DC | PRN
  Filled 2017-02-21: qty 30

## 2017-02-21 MED ORDER — ATENOLOL 25 MG PO TABS: 50.0000 mg | ORAL_TABLET | Freq: Two times a day (BID) | ORAL | Status: DC

## 2017-02-21 MED ORDER — PIOGLITAZONE HCL 15 MG PO TABS
15.0000 mg | ORAL_TABLET | Freq: Every day | ORAL | Status: DC
  Administered 2017-02-22: 15 mg via ORAL
  Filled 2017-02-21 (×2): qty 1

## 2017-02-21 MED ORDER — SODIUM CHLORIDE 0.9 % IV SOLN
INTRAVENOUS | Status: DC
  Administered 2017-02-21: 75 mL/h via INTRAVENOUS
  Administered 2017-02-21: 17:00:00 via INTRAVENOUS

## 2017-02-21 MED ORDER — SERTRALINE HCL 50 MG PO TABS
50.0000 mg | ORAL_TABLET | Freq: Every day | ORAL | Status: DC
  Administered 2017-02-22: 50 mg via ORAL
  Filled 2017-02-21: qty 1

## 2017-02-21 MED ORDER — DEXTROSE 5 % IV SOLN
1.5000 g | Freq: Two times a day (BID) | INTRAVENOUS | Status: AC
  Administered 2017-02-21 – 2017-02-22 (×2): 1.5 g via INTRAVENOUS
  Filled 2017-02-21 (×2): qty 1.5

## 2017-02-21 MED ORDER — FENTANYL CITRATE (PF) 250 MCG/5ML IJ SOLN
INTRAMUSCULAR | Status: DC | PRN
  Administered 2017-02-21 (×5): 50 ug via INTRAVENOUS

## 2017-02-21 MED ORDER — ONDANSETRON HCL 4 MG/2ML IJ SOLN: 4.0000 mg | Freq: Four times a day (QID) | INTRAMUSCULAR | Status: DC | PRN

## 2017-02-21 MED ORDER — ATORVASTATIN CALCIUM 20 MG PO TABS
20.0000 mg | ORAL_TABLET | Freq: Every day | ORAL | Status: DC
  Administered 2017-02-21: 20 mg via ORAL
  Filled 2017-02-21: qty 1

## 2017-02-21 MED ORDER — GLYCOPYRROLATE 0.2 MG/ML IV SOSY
PREFILLED_SYRINGE | INTRAVENOUS | Status: DC | PRN
  Administered 2017-02-21: .2 mg via INTRAVENOUS

## 2017-02-21 MED ORDER — ASPIRIN EC 81 MG PO TBEC
81.0000 mg | DELAYED_RELEASE_TABLET | Freq: Every day | ORAL | Status: DC
  Administered 2017-02-22: 81 mg via ORAL
  Filled 2017-02-21: qty 1

## 2017-02-21 SURGICAL SUPPLY — 47 items
ADH SKN CLS APL DERMABOND .7 (GAUZE/BANDAGES/DRESSINGS) ×1
CANISTER SUCT 3000ML PPV (MISCELLANEOUS) ×3 IMPLANT
CANNULA VESSEL 3MM 2 BLNT TIP (CANNULA) ×6 IMPLANT
CATH ROBINSON RED A/P 18FR (CATHETERS) ×3 IMPLANT
CLIP LIGATING EXTRA MED SLVR (CLIP) ×3 IMPLANT
CLIP LIGATING EXTRA SM BLUE (MISCELLANEOUS) ×3 IMPLANT
CRADLE DONUT ADULT HEAD (MISCELLANEOUS) ×3 IMPLANT
DECANTER SPIKE VIAL GLASS SM (MISCELLANEOUS) IMPLANT
DERMABOND ADVANCED (GAUZE/BANDAGES/DRESSINGS) ×2
DERMABOND ADVANCED .7 DNX12 (GAUZE/BANDAGES/DRESSINGS) ×1 IMPLANT
DRAIN HEMOVAC 1/8 X 5 (WOUND CARE) IMPLANT
ELECT REM PT RETURN 9FT ADLT (ELECTROSURGICAL) ×3
ELECTRODE REM PT RTRN 9FT ADLT (ELECTROSURGICAL) ×1 IMPLANT
EVACUATOR SILICONE 100CC (DRAIN) IMPLANT
GLOVE BIO SURGEON STRL SZ 6.5 (GLOVE) ×2 IMPLANT
GLOVE BIO SURGEONS STRL SZ 6.5 (GLOVE) ×2
GLOVE BIOGEL PI IND STRL 6.5 (GLOVE) IMPLANT
GLOVE BIOGEL PI IND STRL 7.0 (GLOVE) IMPLANT
GLOVE BIOGEL PI INDICATOR 6.5 (GLOVE) ×2
GLOVE BIOGEL PI INDICATOR 7.0 (GLOVE) ×2
GLOVE ECLIPSE 7.5 STRL STRAW (GLOVE) ×2 IMPLANT
GLOVE SS BIOGEL STRL SZ 7.5 (GLOVE) ×1 IMPLANT
GLOVE SUPERSENSE BIOGEL SZ 7.5 (GLOVE) ×2
GOWN STRL REUS W/ TWL LRG LVL3 (GOWN DISPOSABLE) ×3 IMPLANT
GOWN STRL REUS W/ TWL XL LVL3 (GOWN DISPOSABLE) IMPLANT
GOWN STRL REUS W/TWL LRG LVL3 (GOWN DISPOSABLE) ×12
GOWN STRL REUS W/TWL XL LVL3 (GOWN DISPOSABLE) ×3
KIT BASIN OR (CUSTOM PROCEDURE TRAY) ×3 IMPLANT
KIT ROOM TURNOVER OR (KITS) ×3 IMPLANT
KIT SHUNT ARGYLE CAROTID ART 6 (VASCULAR PRODUCTS) IMPLANT
NEEDLE 22X1 1/2 (OR ONLY) (NEEDLE) IMPLANT
NS IRRIG 1000ML POUR BTL (IV SOLUTION) ×6 IMPLANT
PACK CAROTID (CUSTOM PROCEDURE TRAY) ×3 IMPLANT
PAD ARMBOARD 7.5X6 YLW CONV (MISCELLANEOUS) ×6 IMPLANT
PATCH HEMASHIELD 8X75 (Vascular Products) ×2 IMPLANT
SHUNT CAROTID BYPASS 10 (VASCULAR PRODUCTS) ×2 IMPLANT
SHUNT CAROTID BYPASS 12FRX15.5 (VASCULAR PRODUCTS) IMPLANT
SUT ETHILON 3 0 PS 1 (SUTURE) IMPLANT
SUT PROLENE 6 0 CC (SUTURE) ×5 IMPLANT
SUT SILK 3 0 (SUTURE)
SUT SILK 3-0 18XBRD TIE 12 (SUTURE) IMPLANT
SUT VIC AB 3-0 SH 27 (SUTURE) ×6
SUT VIC AB 3-0 SH 27X BRD (SUTURE) ×2 IMPLANT
SUT VICRYL 4-0 PS2 18IN ABS (SUTURE) ×3 IMPLANT
SYR CONTROL 10ML LL (SYRINGE) IMPLANT
TOWEL GREEN STERILE (TOWEL DISPOSABLE) ×3 IMPLANT
WATER STERILE IRR 1000ML POUR (IV SOLUTION) ×3 IMPLANT

## 2017-02-21 NOTE — Transfer of Care (Signed)
Immediate Anesthesia Transfer of Care Note  Patient: Adrian Andrews  Procedure(s) Performed: LEFT CAROTID ENDARTERECTOMY (Left Neck) PATCH ANGIOPLASTY OF LEFT CAROTID ARTERY USING HEMASHIELD PLATINUM FINESSE PATCH (Left Neck)  Patient Location: PACU  Anesthesia Type:General  Level of Consciousness: drowsy and patient cooperative  Airway & Oxygen Therapy: Patient Spontanous Breathing and Patient connected to face mask oxygen  Post-op Assessment: Report given to RN and Post -op Vital signs reviewed and stable  Post vital signs: Reviewed and stable  Last Vitals:  Vitals:   02/21/17 0754 02/21/17 1305  BP: (!) 163/57 110/71  Pulse: 64 81  Resp: (!) 0 12  Temp: 36.4 C 36.9 C  SpO2: 100% 100%    Last Pain:  Vitals:   02/21/17 0754  TempSrc: Oral      Patients Stated Pain Goal: 3 (02/54/27 0623)  Complications: No apparent anesthesia complications

## 2017-02-21 NOTE — Interval H&P Note (Signed)
History and Physical Interval Note:  02/21/2017 10:08 AM  Adrian Andrews  has presented today for surgery, with the diagnosis of LEFT CAROTID STENOSIS  The various methods of treatment have been discussed with the patient and family. After consideration of risks, benefits and other options for treatment, the patient has consented to  Procedure(s): ENDARTERECTOMY CAROTID LEFT (Left) as a surgical intervention .  The patient's history has been reviewed, patient examined, no change in status, stable for surgery.  I have reviewed the patient's chart and labs.  Questions were answered to the patient's satisfaction.     Curt Jews

## 2017-02-21 NOTE — Progress Notes (Signed)
Pt's A-line waveform dampened, cuff pressures have been consistently in SBP 100s, MAPs 68-80s. Pt denies any s/s of hypotension. Continuing dopamine gtt  At current rate. Will continue to closely monitor.

## 2017-02-21 NOTE — Telephone Encounter (Signed)
-----   Message from Mena Goes, RN sent at 02/21/2017  2:16 PM EST ----- Regarding: 2-3 weeks postop CEA   ----- Message ----- From: Gabriel Earing, PA-C Sent: 02/21/2017  12:43 PM To: [facility] Charge Pool  S/p left CEA 02/21/17.  F/u with Dr. Donnetta Hutching in 2-3 weeks.  Thanks

## 2017-02-21 NOTE — Discharge Instructions (Signed)
° °  [facility] [place] ° °Discharge Instructions °  °Carotid Endarterectomy (CEA) ° °Please refer to the following instructions for your post-procedure care. Your surgeon or physician assistant will discuss any changes with you. ° °Activity ° °You are encouraged to walk as much as you can. You can slowly return to normal activities but must avoid strenuous activity and heavy lifting until your doctor tell you it's OK. Avoid activities such as vacuuming or swinging a golf club. You can drive after one week if you are comfortable and you are no longer taking prescription pain medications. It is normal to feel tired for serval weeks after your surgery. It is also normal to have difficulty with sleep habits, eating, and bowel movements after surgery. These will go away with time. ° °Bathing/Showering ° °You may shower after you go home. Do not soak in a bathtub, hot tub, or swim until the incision heals completely. ° °Incision Care ° °Shower every day. Clean your incision with mild soap and water. Pat the area dry with a clean towel. You do not need a bandage unless otherwise instructed. Do not apply any ointments or creams to your incision. You may have skin glue on your incision. Do not peel it off. It will come off on its own in about one week. Your incision may feel thickened and raised for several weeks after your surgery. This is normal and the skin will soften over time. For Men Only: It's OK to shave around the incision but do not shave the incision itself for 2 weeks. It is common to have numbness under your chin that could last for several months. ° °Diet ° °Resume your normal diet. There are no special food restrictions following this procedure. A low fat/low cholesterol diet is recommended for all patients with vascular disease. In order to heal from your surgery, it is CRITICAL to get adequate nutrition. Your body requires vitamins, minerals, and protein. Vegetables are the best  source of vitamins and minerals. Vegetables also provide the perfect balance of protein. Processed food has little nutritional value, so try to avoid this. ° °Medications ° °Resume taking all of your medications unless your doctor or physician assistant tells you not to. If your incision is causing pain, you may take over-the- counter pain relievers such as acetaminophen (Tylenol). If you were prescribed a stronger pain medication, please be aware these medications can cause nausea and constipation. Prevent nausea by taking the medication with a snack or meal. Avoid constipation by drinking plenty of fluids and eating foods with a high amount of fiber, such as fruits, vegetables, and grains. Do not take Tylenol if you are taking prescription pain medications. ° °Follow Up ° °Our office will schedule a follow up appointment 2-3 weeks following discharge. ° °Please call us immediately for any of the following conditions ° °Increased pain, redness, drainage (pus) from your incision site. °Fever of 101 degrees or higher. °If you should develop stroke (slurred speech, difficulty swallowing, weakness on one side of your body, loss of vision) you should call 911 and go to the nearest emergency room. ° °Reduce your risk of vascular disease: ° °Stop smoking. If you would like help call [facility] at 1-800-QUIT-NOW (1-800-784-8669) or [place] at 336-586-4000. °Manage your cholesterol °Maintain a desired weight °Control your diabetes °Keep your blood pressure down ° °If you have any questions, please call the office at [phone]. ° °

## 2017-02-21 NOTE — Anesthesia Procedure Notes (Signed)
Arterial Line Insertion Start/End2/08/2017 9:16 AM, 02/21/2017 9:21 AM Performed by: Wilburn Cornelia, CRNA  Patient location: Pre-op. Preanesthetic checklist: patient identified, IV checked, risks and benefits discussed, surgical consent, monitors and equipment checked and pre-op evaluation Lidocaine 1% used for infiltration and patient sedated Left was placed Catheter size: 20 G Hand hygiene performed  and maximum sterile barriers used  Allen's test indicative of satisfactory collateral circulation Attempts: 1 Procedure performed without using ultrasound guided technique. Following insertion, Biopatch and dressing applied. Post procedure assessment: normal  Patient tolerated the procedure well with no immediate complications.

## 2017-02-21 NOTE — Op Note (Signed)
    OPERATIVE REPORT  DATE OF SURGERY: 02/21/2017  PATIENT: Adrian Andrews, 67 y.o. male MRN: [id number]  DOB: November 04, 1950  PRE-OPERATIVE DIAGNOSIS: Symptomatic left internal carotid artery stenosis  POST-OPERATIVE DIAGNOSIS:  Same  PROCEDURE: Left carotid endarterectomy and Dacron patch angioplasty  SURGEON:  Curt Jews, M.D.  PHYSICIAN ASSISTANT: Liana Crocker PA-C  ANESTHESIA: General  EBL: Less than 100 ml  Total I/O In: 1000 [I.V.:1000] Out: -   BLOOD ADMINISTERED: None  DRAINS: None  SPECIMEN: None  COUNTS CORRECT:  YES  PLAN OF CARE: PACU  PATIENT DISPOSITION:  PACU - hemodynamically stable  PROCEDURE DETAILS: The patient was taken to the operating room placed supine position where the area of the left neck was prepped and draped in usual sterile fashion.  Incision was made anterior to the sternocleidomastoid and was carried down through the platysma with electrocautery.  The sternocleidomastoid was reflected posteriorly and the carotid sheath was opened.  The facial vein was ligated with 2-0 silk ties and divided.  The vagus nerve was identified and preserved.  The common carotid artery was encircled with an umbilical tape and tourniquet.  Dissection was continued further proximally and the muscle was divided.  The dissection was continued onto the bifurcation in the superior thyroid artery was encircled with a 2-0 silk Potts tie.  The external carotid was encircled with a blue vessel loop and the internal carotid encircled with an umbilical tape and Rummel tourniquet.  The hypoglossal nerve was identified and preserved.  The patient was given 8000 units of intravenous heparin and after adequate circulation time the internal/external and common carotid arteries were occluded.  The common carotid artery was opened with an 11 blade and sent along [place] with Potts scissors through the plaque onto the internal carotid.  A 10 shunt was passed up the internal carotid and  allowed to backbleed then down the common carotid where it was secured with a Rummel tourniquet.  The endarterectomy was again with common carotid artery and the plaque was divided proximally with Potts scissors.  The endarterectomy was coming continued onto the bifurcation.  The external carotid was in the peak in the internal carotid was endarterectomized.  Remaining atheromatous debris was removed from the endarterectomy plane.  A Finesse Hemashield Dacron patch was brought onto the field and was sewn as a patch angioplasty with a running 6-0 Prolene suture.  Prior to completion of the closure the shunt was removed and the anastomosis was completed.  Flow was restored first to the external and the internal carotid excellent flow characteristics were noted with hand-held Doppler in the internal and external carotid arteries.  The patient was given 50 mg of protamine to reverse heparin.  Wounds irrigated with saline.  Hemostasis obtained with cautery.  Wounds were closed with 3-0 Vicryl to reapproximate the sternocleidomastoid over the carotid sheath.  Next the platysma was closed with a running 3-0 Vicryl suture and finally the skin was closed with a 4-0 subcuticular Vicryl stitch.  Patient was awake and neurologically intact in the operating room and was transferred to the recovery in stable condition   Rosetta Posner, M.D., [facility] 02/21/2017 1:04 PM

## 2017-02-21 NOTE — Telephone Encounter (Signed)
Sched appt 03/18/17 at 3:45. Lm on hm# to inform pt of appt.

## 2017-02-21 NOTE — Anesthesia Postprocedure Evaluation (Signed)
Anesthesia Post Note  Patient: WETZEL MEESTER  Procedure(s) Performed: LEFT CAROTID ENDARTERECTOMY (Left Neck) PATCH ANGIOPLASTY OF LEFT CAROTID ARTERY USING HEMASHIELD PLATINUM FINESSE PATCH (Left Neck)     Patient location during evaluation: PACU Anesthesia Type: General Level of consciousness: awake and alert Pain management: pain level controlled Vital Signs Assessment: post-procedure vital signs reviewed and stable Respiratory status: spontaneous breathing, nonlabored ventilation, respiratory function stable and patient connected to nasal cannula oxygen Cardiovascular status: blood pressure returned to baseline and stable Postop Assessment: no apparent nausea or vomiting Anesthetic complications: no    Last Vitals:  Vitals:   02/21/17 1419 02/21/17 1425  BP: (!) 121/52 (!) 109/56  Pulse: 60 65  Resp: 10 (!) 9  Temp:    SpO2: 97% 98%    Last Pain:  Vitals:   02/21/17 0754  TempSrc: Oral                 Raffaella Edison,W. EDMOND

## 2017-02-21 NOTE — Progress Notes (Signed)
   Patient alert and oriented x 3 No tongue deviation, smile is symmetric Left neck incision soft without hematoma Complaints of mild HA Hypotensive in PACU, 60's to 80's received bolus NS.  BP now in the 394'V-200 systolic  S/P left CEA If hypotension continues we will give 1 more bolus of NS.   After that he may require Dopamine.   Pending 4E room   Roxy Horseman PA-C

## 2017-02-21 NOTE — Anesthesia Procedure Notes (Signed)
Procedure Name: Intubation Date/Time: 02/21/2017 10:58 AM Performed by: Freddie Breech, CRNA Pre-anesthesia Checklist: Patient identified, Emergency Drugs available, Suction available and Patient being monitored Patient Re-evaluated:Patient Re-evaluated prior to induction Oxygen Delivery Method: Circle System Utilized Preoxygenation: Pre-oxygenation with 100% oxygen Induction Type: IV induction Ventilation: Mask ventilation without difficulty and Oral airway inserted - appropriate to patient size Laryngoscope Size: Mac and 4 Grade View: Grade I Tube type: Oral Tube size: 7.0 mm Number of attempts: 1 Airway Equipment and Method: Stylet,  Oral airway and LTA kit utilized Placement Confirmation: ETT inserted through vocal cords under direct vision,  positive ETCO2 and breath sounds checked- equal and bilateral Secured at: 21 cm Tube secured with: Tape Dental Injury: Teeth and Oropharynx as per pre-operative assessment

## 2017-02-21 NOTE — Anesthesia Preprocedure Evaluation (Addendum)
Anesthesia Evaluation  Patient identified by MRN, date of birth, ID band Patient awake    Reviewed: Allergy & Precautions, H&P , NPO status , Patient's Chart, lab work & pertinent test results, reviewed documented beta blocker date and time   Airway Mallampati: II  TM Distance: >3 FB Neck ROM: Full    Dental no notable dental hx. (+) Edentulous Upper, Edentulous Lower, Dental Advisory Given   Pulmonary Current Smoker,    Pulmonary exam normal breath sounds clear to auscultation       Cardiovascular hypertension, Pt. on medications and Pt. on home beta blockers + Peripheral Vascular Disease   Rhythm:Regular Rate:Normal     Neuro/Psych Anxiety CVA, No Residual Symptoms    GI/Hepatic negative GI ROS, Neg liver ROS,   Endo/Other  diabetes, Type 2, Oral Hypoglycemic Agents  Renal/GU Renal InsufficiencyRenal disease  negative genitourinary   Musculoskeletal  (+) Arthritis , Osteoarthritis,    Abdominal   Peds  Hematology negative hematology ROS (+)   Anesthesia Other Findings   Reproductive/Obstetrics negative OB ROS                            Anesthesia Physical Anesthesia Plan  ASA: III  Anesthesia Plan: General   Post-op Pain Management:    Induction: Intravenous  PONV Risk Score and Plan: 2 and Ondansetron and Dexamethasone  Airway Management Planned: Oral ETT  Additional Equipment: Arterial line  Intra-op Plan:   Post-operative Plan: Extubation in OR  Informed Consent: I have reviewed the patients History and Physical, chart, labs and discussed the procedure including the risks, benefits and alternatives for the proposed anesthesia with the patient or authorized representative who has indicated his/her understanding and acceptance.   Dental advisory given  Plan Discussed with: CRNA  Anesthesia Plan Comments:         Anesthesia Quick Evaluation

## 2017-02-22 LAB — BASIC METABOLIC PANEL
Anion gap: 10 (ref 5–15)
BUN: 20 mg/dL (ref 6–20)
CHLORIDE: 99 mmol/L — AB (ref 101–111)
CO2: 23 mmol/L (ref 22–32)
Calcium: 8.3 mg/dL — ABNORMAL LOW (ref 8.9–10.3)
Creatinine, Ser: 1.24 mg/dL (ref 0.61–1.24)
GFR calc Af Amer: >60 mL/min (ref >60)
GFR calc non Af Amer: 58 mL/min — ABNORMAL LOW (ref >60)
Glucose, Bld: 233 mg/dL — ABNORMAL HIGH (ref 65–99)
POTASSIUM: 4.1 mmol/L (ref 3.5–5.1)
SODIUM: 132 mmol/L — AB (ref 135–145)

## 2017-02-22 LAB — CBC
HEMATOCRIT: 32.4 % — AB (ref 39.0–52.0)
HEMOGLOBIN: 11.6 g/dL — AB (ref 13.0–17.0)
MCH: 36.5 pg — AB (ref 26.0–34.0)
MCHC: 35.8 g/dL (ref 30.0–36.0)
MCV: 101.9 fL — ABNORMAL HIGH (ref 78.0–100.0)
Platelets: 155 10*3/uL (ref 150–400)
RBC: 3.18 MIL/uL — AB (ref 4.22–5.81)
RDW: 14.4 % (ref 11.5–15.5)
WBC: 9 10*3/uL (ref 4.0–10.5)

## 2017-02-22 LAB — GLUCOSE, CAPILLARY: GLUCOSE-CAPILLARY: 207 mg/dL — AB (ref 65–99)

## 2017-02-22 MED ORDER — TRAMADOL HCL 50 MG PO TABS: 50.0000 mg | ORAL_TABLET | Freq: Two times a day (BID) | ORAL | 0 refills | Status: DC | PRN

## 2017-02-22 NOTE — Progress Notes (Signed)
Pt discharged in stable condition via wheelchair into the care of his friend, Ms. Humble, via private vehicle.  PIV removed intact w/o S&S of complications.  Discharge instructions reviewed with pt. Pt verbalized understanding. Prescription given to pt.

## 2017-02-22 NOTE — Progress Notes (Addendum)
[facility] [place]  Subjective  - Doing well over all.     Objective (!) 119/54 (!) 52 97.9 F (36.6 C) (Oral) 11 98%  Intake/Output Summary (Last 24 hours) at 02/22/2017 0719 Last data filed at 02/22/2017 0300 Gross per 24 hour  Intake 2468.56 ml  Output 560 ml  Net 1908.56 ml    Moving all 4 ext. Left neck incision healing well without hematoma No tongue deviation, slight facial droop on the left likely mandibular neuropraxia Heart RRR Lungs non labored breathing Gen NAD   Assessment/Planning: POD # 1 left CEA  He is still on Dopamine support for BP.  We will work on weaning him off.  There has been a discrepancy between the A line and cuff pressures.  I will D/C the A line and go by cuff pressures.  Cuff systolic pressures 364-680 with a MAP of 60's to 70's.  Hold BB this am.  He has gotten up to the bed side toilet several times and is asymptomatic denise dizziness and SOB.  He is tolerating PO's.  Good UO.  If he is weaned off the Dopamine and sustains his BP systolic > 321 with MAP greater than 60 and remains asymptomatic he will be discharged home.  We will ask him to monitor his BP at home and hold his BB until his systolic is > 224.  F/U in our office in 2 weeks.  Roxy Horseman 02/22/2017 7:19 AM --  Laboratory Lab Results: Recent Labs    02/19/17 1130 02/22/17 0235  WBC 10.7* 9.0  HGB 15.8 11.6*  HCT 43.2 32.4*  PLT 203 155   BMET Recent Labs    02/19/17 1130 02/22/17 0235  NA 132* 132*  K 5.3* 4.1  CL 98* 99*  CO2 23 23  GLUCOSE 244* 233*  BUN 29* 20  CREATININE 1.15 1.24  CALCIUM 10.1 8.3*    COAG Lab Results  Component Value Date   INR 0.98 02/19/2017   No results found for: PTT    I have interviewed and examined patient with PA and agree with assessment and plan above.   Lenzi Marmo C. Donzetta Matters, MD [facility] [place] Office: [phone] Pager: [phone]

## 2017-02-23 LAB — TYPE AND SCREEN
ABO/RH(D): A POS
Antibody Screen: POSITIVE
DAT, IgG: NEGATIVE
Unit division: 0

## 2017-02-23 LAB — BPAM RBC
Blood Product Expiration Date: 201902242359
Unit Type and Rh: 6200

## 2017-02-24 ENCOUNTER — Encounter (HOSPITAL_COMMUNITY): Payer: Self-pay | Admitting: Vascular Surgery

## 2017-02-24 LAB — TYPE AND SCREEN
ABO/RH(D): A POS
ANTIBODY SCREEN: POSITIVE
Unit division: 0

## 2017-02-24 LAB — BPAM RBC
BLOOD PRODUCT EXPIRATION DATE: 201903052359
Unit Type and Rh: 6200

## 2017-02-24 NOTE — Discharge Summary (Signed)
[facility] Discharge Summary   Patient ID:  Adrian Andrews MRN: [id number] DOB/AGE: 69-14-1952 69 y.o.  Admit date: 02/21/2017 Discharge date: 02/22/2017 Date of Surgery: 02/21/2017 Surgeon: Juliann Mule): Early, Arvilla Meres, MD  Admission Diagnosis: LEFT CAROTID STENOSIS  Discharge Diagnoses:  LEFT CAROTID STENOSIS  Secondary Diagnoses: Past Medical History:  Diagnosis Date  . Anxiety   . Arthritis    back  . Chronic kidney disease   . Diabetes mellitus without complication ([place])   . Dyspnea   . Hyperlipidemia   . Hypertension   . Neuropathy   . Peripheral vascular disease ([place])    Carotid artery stenosis  . Stroke [facility])    noted on MRI    Procedure(s): LEFT CAROTID ENDARTERECTOMY PATCH ANGIOPLASTY OF LEFT CAROTID ARTERY USING HEMASHIELD PLATINUM FINESSE PATCH  Discharged Condition: good  HPI:  67 y/o male post left CVA with left high grade carotid stenosis velocity 556/92.  He will under go left CEA by Dr. Donnetta Hutching for stroke prevention in the future.     Hospital Course:  Adrian Andrews is a 67 y.o. male is S/P Left Procedure(s): LEFT CAROTID ENDARTERECTOMY PATCH ANGIOPLASTY OF LEFT CAROTID ARTERY USING HEMASHIELD PLATINUM FINESSE PATCH  He required Dopamine support for hypotension post op after NS bolus x 2.  Post op day 1 he maintained BP > 110 off dopamine and was asymptomatic when ambulating and sitting without BP drop.  Good UO and no dizziness or SOB.  He was discharged home in stable condition.  He was asked to monitor his BP and to hold his BB if his BP stayed below systolic 782.    Gen NAD    Significant Diagnostic Studies: CBC Lab Results  Component Value Date   WBC 9.0 02/22/2017   HGB 11.6 (L) 02/22/2017   HCT 32.4 (L) 02/22/2017   MCV 101.9 (H) 02/22/2017   PLT 155 02/22/2017    BMET    Component Value Date/Time   NA 132 (L) 02/22/2017 0235   K 4.1 02/22/2017 0235   CL 99 (L) 02/22/2017 0235   CO2 23 02/22/2017 0235   GLUCOSE 233 (H) 02/22/2017 0235   BUN 20 02/22/2017 0235   CREATININE 1.24 02/22/2017 0235   CALCIUM 8.3 (L) 02/22/2017 0235   GFRNONAA 58 (L) 02/22/2017 0235   GFRAA >60 02/22/2017 0235   COAG Lab Results  Component Value Date   INR 0.98 02/19/2017     Disposition:  Discharge to :Home Discharge Instructions    Call MD for:  redness, tenderness, or signs of infection (pain, swelling, bleeding, redness, odor or green/yellow discharge around incision site)   Complete by:  As directed    Call MD for:  severe or increased pain, loss or decreased feeling  in affected limb(s)   Complete by:  As directed    Call MD for:  temperature >100.5   Complete by:  As directed    Resume previous diet   Complete by:  As directed      Allergies as of 02/22/2017      Reactions   Clindamycin/lincomycin    Throat tightens up   Metformin And Related Other (See Comments)   Effected kidney function      Medication List    TAKE these medications   aspirin EC 81 MG tablet Take 81 mg by mouth daily.   aspirin-acetaminophen-caffeine 250-250-65 MG tablet Commonly known as:  EXCEDRIN MIGRAINE Take 1 tablet by mouth daily as needed for headache.  atenolol 50 MG tablet Commonly known as:  TENORMIN Take 50 mg by mouth 2 (two) times daily.   atorvastatin 40 MG tablet Commonly known as:  LIPITOR Take 20 mg by mouth at bedtime.   gabapentin 300 MG capsule Commonly known as:  NEURONTIN Take 300 mg by mouth at bedtime.   glipiZIDE 10 MG tablet Commonly known as:  GLUCOTROL Take 10 mg by mouth 2 (two) times daily.   pioglitazone 15 MG tablet Commonly known as:  ACTOS Take 15 mg by mouth daily.   sertraline 50 MG tablet Commonly known as:  ZOLOFT Take 50 mg by mouth daily.   traMADol 50 MG tablet Commonly known as:  ULTRAM Take 1 tablet (50 mg total) by mouth 3 (three) times daily as needed for moderate pain (arthritis). What changed:  Another medication with the same name was added.  Make sure you understand how and when to take each.   traMADol 50 MG tablet Commonly known as:  ULTRAM Take 1 tablet (50 mg total) by mouth every 12 (twelve) hours as needed for moderate pain. What changed:  You were already taking a medication with the same name, and this prescription was added. Make sure you understand how and when to take each.      Verbal and written Discharge instructions given to the patient. Wound care per Discharge AVS Follow-up Information    Early, Arvilla Meres, MD Follow up in 3 week(s).   Specialties:  Vascular Surgery, Cardiology Why:  Office will call you to arrange your appt (sent) Contact information: [place] [address] [phone]           Signed: Roxy Horseman 02/24/2017, 9:22 AM --- For VQI Registry use --- Instructions: Press F2 to tab through selections.  Delete question if not applicable.   Modified Rankin score at D/C (0-6): Rankin Score=0  IV medication needed for:  1. Hypertension: No 2. Hypotension: Yes  Post-op Complications: No  1. Post-op CVA or TIA: No  If yes: Event classification (right eye, left eye, right cortical, left cortical, verterobasilar, other):   If yes: Timing of event (intra-op, <6 hrs post-op, >=6 hrs post-op, unknown):   2. CN injury: No  If yes: CN  injuried   3. Myocardial infarction: No  If yes: Dx by (EKG or clinical, Troponin):   4.  CHF: No  5.  Dysrhythmia (new): No  6. Wound infection: No  7. Reperfusion symptoms: No  8. Return to OR: No  If yes: return to OR for (bleeding, neurologic, other CEA incision, other):   Discharge medications: Statin use:  Yes ASA use:  Yes Beta blocker use:  Yes ACE-Inhibitor use:  No  for medical reason   P2Y12 Antagonist use: [x ] None, [ ]  Plavix, [ ]  Plasugrel, [ ]  Ticlopinine, [ ]  Ticagrelor, [ ]  Other, [ ]  No for medical reason, [ ]  Non-compliant, [ ]  Not-indicated Anti-coagulant use:  [ x] None, [ ]  Warfarin, [ ]   Rivaroxaban, [ ]  Dabigatran, [ ]  Other, [ ]  No for medical reason, [ ]  Non-compliant, [ ]  Not-indicated

## 2017-02-26 NOTE — Consult Note (Signed)
            [facility] CM Primary Care Navigator  02/26/2017  ILIYA SPIVACK 03-Jun-1950 [id number]   Attempt to seepatient at the bedsideto identify possible discharge needs but he was alreadydischargedhome per staff report.   Primary care provider's officeis listed as doing transition of care (TOC).  Patient has discharge instruction to follow-up with vascular surgery/ cardiology in 3 weeks.   For additional questions please contact:  Edwena Felty A. Abb Gobert, BSN, RN-BC [facility] PRIMARY CARE Navigator Cell: [phone]

## 2017-02-27 ENCOUNTER — Telehealth: Payer: Self-pay | Admitting: *Deleted

## 2017-02-27 NOTE — Telephone Encounter (Signed)
Patient called with questions about treatment of neck incision. States edges are approximated, no heat or drainage. Incision feel a little numb and slight swelling, just at incision. States he is showering and what treatment. Discussed using Hibiclens left from pre-op, and keep fingers off area to keep it clean. To call if any problems.

## 2017-03-04 DIAGNOSIS — N183 Chronic kidney disease, stage 3 (moderate): Secondary | ICD-10-CM | POA: Diagnosis not present

## 2017-03-04 DIAGNOSIS — F419 Anxiety disorder, unspecified: Secondary | ICD-10-CM | POA: Diagnosis not present

## 2017-03-04 DIAGNOSIS — Z8673 Personal history of transient ischemic attack (TIA), and cerebral infarction without residual deficits: Secondary | ICD-10-CM | POA: Diagnosis not present

## 2017-03-04 DIAGNOSIS — R202 Paresthesia of skin: Secondary | ICD-10-CM | POA: Diagnosis not present

## 2017-03-04 DIAGNOSIS — F17200 Nicotine dependence, unspecified, uncomplicated: Secondary | ICD-10-CM | POA: Diagnosis not present

## 2017-03-04 DIAGNOSIS — I129 Hypertensive chronic kidney disease with stage 1 through stage 4 chronic kidney disease, or unspecified chronic kidney disease: Secondary | ICD-10-CM | POA: Diagnosis not present

## 2017-03-04 DIAGNOSIS — F4321 Adjustment disorder with depressed mood: Secondary | ICD-10-CM | POA: Diagnosis not present

## 2017-03-04 DIAGNOSIS — G458 Other transient cerebral ischemic attacks and related syndromes: Secondary | ICD-10-CM | POA: Diagnosis not present

## 2017-03-04 DIAGNOSIS — F321 Major depressive disorder, single episode, moderate: Secondary | ICD-10-CM | POA: Diagnosis not present

## 2017-03-04 DIAGNOSIS — E785 Hyperlipidemia, unspecified: Secondary | ICD-10-CM | POA: Diagnosis not present

## 2017-03-04 DIAGNOSIS — I6529 Occlusion and stenosis of unspecified carotid artery: Secondary | ICD-10-CM | POA: Diagnosis not present

## 2017-03-04 DIAGNOSIS — E1151 Type 2 diabetes mellitus with diabetic peripheral angiopathy without gangrene: Secondary | ICD-10-CM | POA: Diagnosis not present

## 2017-03-18 ENCOUNTER — Encounter: Payer: Self-pay | Admitting: Vascular Surgery

## 2017-03-18 ENCOUNTER — Ambulatory Visit (INDEPENDENT_AMBULATORY_CARE_PROVIDER_SITE_OTHER): Payer: Medicare Other | Admitting: Vascular Surgery

## 2017-03-18 ENCOUNTER — Other Ambulatory Visit: Payer: Self-pay

## 2017-03-18 VITALS — BP 148/66 | HR 76 | Temp 98.1°F | Resp 16 | Ht 68.0 in | Wt 158.0 lb

## 2017-03-18 DIAGNOSIS — I6522 Occlusion and stenosis of left carotid artery: Secondary | ICD-10-CM

## 2017-03-18 NOTE — Progress Notes (Signed)
   Patient name: Adrian Andrews MRN: [id number] DOB: [birth year] Sex: male  REASON FOR VISIT: Follow-up recent left carotid endarterectomy for symptomatic disease on 02/21/2017  HPI: Adrian Andrews is a 67 y.o. male here today for follow-up.  He had a preoperative stroke with some expressive a aphasia.  He was found to have a high-grade left carotid stenosis and underwent uneventful endarterectomy and was discharged home on postoperative day 1.  He reports that he is continued to have some improvement in his speech.  He has had no difficulty following his surgery.  He does have a usual peri-incisional numbness.  Has had no new or worsening neurologic deficits.  Current Outpatient Medications  Medication Sig Dispense Refill  . aspirin EC 81 MG tablet Take 81 mg by mouth daily.    Marland Kitchen aspirin-acetaminophen-caffeine (EXCEDRIN MIGRAINE) 250-250-65 MG tablet Take 1 tablet by mouth daily as needed for headache.    Marland Kitchen atenolol (TENORMIN) 50 MG tablet Take 50 mg by mouth 2 (two) times daily.    Marland Kitchen atorvastatin (LIPITOR) 40 MG tablet Take 20 mg by mouth at bedtime.     . gabapentin (NEURONTIN) 300 MG capsule Take 300 mg by mouth at bedtime.     Marland Kitchen glipiZIDE (GLUCOTROL) 10 MG tablet Take 10 mg by mouth 2 (two) times daily.    . pioglitazone (ACTOS) 15 MG tablet Take 15 mg by mouth daily.    . sertraline (ZOLOFT) 50 MG tablet Take 50 mg by mouth daily.    . traMADol (ULTRAM) 50 MG tablet Take 1 tablet (50 mg total) by mouth 3 (three) times daily as needed for moderate pain (arthritis). 8 tablet 0  . traMADol (ULTRAM) 50 MG tablet Take 1 tablet (50 mg total) by mouth every 12 (twelve) hours as needed for moderate pain. 6 tablet 0   No current facility-administered medications for this visit.      PHYSICAL EXAM: Vitals:   03/18/17 1524 03/18/17 1527  BP: 107/75 (!) 148/66  Pulse: 76   Resp: 16   Temp: 98.1 F (36.7 C)   TempSrc: Oral   SpO2: 98%   Weight: 158 lb (71.7  kg)   Height: 5\' 8"  (1.727 m)     GENERAL: The patient is a well-nourished male, in no acute distress. The vital signs are documented above. His left neck incision is healing nicely he has no motor weakness.  He is speaking fluently to me today. He has a harsh systolic murmur and reports that this is known and has been followed with echocardiogram  MEDICAL ISSUES: Stable status post left carotid endarterectomy symptomatic disease.  We will continue his full activities.  He is questioning some swelling around his ankle recently and is asking for diuretics.  I have deferred this.  He does not have significant swelling currently and explained that he would have to have monitoring of his electrolytes if he was started on diuretic which does not appear to be an issue.  We will see him again in 6 months with repeat carotid duplex   Rosetta Posner, MD [facility] Tel [phone] Pager [phone]

## 2017-03-20 ENCOUNTER — Other Ambulatory Visit: Payer: Self-pay

## 2017-03-20 DIAGNOSIS — I6522 Occlusion and stenosis of left carotid artery: Secondary | ICD-10-CM

## 2017-04-01 ENCOUNTER — Telehealth: Payer: Self-pay | Admitting: *Deleted

## 2017-04-01 ENCOUNTER — Encounter: Payer: Self-pay | Admitting: Family

## 2017-04-01 ENCOUNTER — Ambulatory Visit (INDEPENDENT_AMBULATORY_CARE_PROVIDER_SITE_OTHER): Payer: Medicare Other | Admitting: Family

## 2017-04-01 ENCOUNTER — Other Ambulatory Visit: Payer: Self-pay

## 2017-04-01 VITALS — BP 145/86 | HR 82 | Temp 98.9°F | Resp 16 | Ht 68.0 in | Wt 157.2 lb

## 2017-04-01 DIAGNOSIS — T8149XA Infection following a procedure, other surgical site, initial encounter: Secondary | ICD-10-CM

## 2017-04-01 DIAGNOSIS — Z9889 Other specified postprocedural states: Secondary | ICD-10-CM

## 2017-04-01 DIAGNOSIS — T8141XA Infection following a procedure, superficial incisional surgical site, initial encounter: Secondary | ICD-10-CM

## 2017-04-01 DIAGNOSIS — I6522 Occlusion and stenosis of left carotid artery: Secondary | ICD-10-CM

## 2017-04-01 MED ORDER — CEPHALEXIN 500 MG PO CAPS: 500.0000 mg | ORAL_CAPSULE | Freq: Three times a day (TID) | ORAL | 0 refills | Status: DC

## 2017-04-01 NOTE — Progress Notes (Signed)
Postoperative Visit   History of Present Illness  Adrian Andrews is a 67 y.o. male who is s/p left carotid endarterectomy and Dacron patch angioplasty on 02-21-17 by Dr. Donnetta Hutching for  symptomatic left internal carotid artery stenosis.   He had a preoperative stroke with some expressive aphasia.  He was found to have a high-grade left carotid stenosis and underwent uneventful endarterectomy and was discharged home on postoperative day 1.  Has had no new or worsening neurologic deficits  Dr. Donnetta Hutching evaluated pt on 03-18-17. At that time pt's left neck incision was healing nicely and he had no motor weakness.  He was speaking fluently. He has a harsh systolic murmur and reports that this is known and has been followed with echocardiogram Stable status post left carotid endarterectomy symptomatic disease.  Pt was to continue his full activities.  He questioned some swelling around his ankle and asked for diuretics.  Dr. Donnetta Hutching deferred this.  He did not have significant swelling at that time and explained that he would have to have monitoring of his electrolytes if he was started on diuretic which did not appear to be an issue. Pt was to return in 6 months with repeat carotid duplex.  He is scheduled to return on 09-23-17 with carotid duplex and see Dr. Donnetta Hutching.   He returns today with c/o yellow/brown drainage from proximal aspect of left side neck incision that started about 4 days ago. He denies fever or chills. The drainage is mostly at night, seem to build up over the daytime. He has been shaving on the incision.   He smokes 1/2 ppd, started smoking about age 64 years. He has DM, last A1C result was 7.4 on 02-19-17.    The patient's neck incision is healing with a soft mass at the proximal aspect of the left side neck incision.   For VQI Use Only  PRE-ADM LIVING: Home  AMB STATUS: Ambulatory    Past Medical History:  Diagnosis Date  . Anxiety   . Arthritis    back  . Chronic kidney  disease   . Diabetes mellitus without complication ([place])   . Dyspnea   . Hyperlipidemia   . Hypertension   . Neuropathy   . Peripheral vascular disease ([place])    Carotid artery stenosis  . Stroke [facility])    noted on MRI    Past Surgical History:  Procedure Laterality Date  . ENDARTERECTOMY Left 02/21/2017   Procedure: LEFT CAROTID ENDARTERECTOMY;  Surgeon: Rosetta Posner, MD;  Location: [facility] OR;  Service: Vascular;  Laterality: Left;  . lower extremity stenting    . PATCH ANGIOPLASTY Left 02/21/2017   Procedure: PATCH ANGIOPLASTY OF LEFT CAROTID ARTERY USING HEMASHIELD PLATINUM FINESSE PATCH;  Surgeon: Rosetta Posner, MD;  Location: [facility] OR;  Service: Vascular;  Laterality: Left;    Social History   Socioeconomic History  . Marital status: Single    Spouse name: Not on file  . Number of children: Not on file  . Years of education: Not on file  . Highest education level: Not on file  Social Needs  . Financial resource strain: Not on file  . Food insecurity - worry: Not on file  . Food insecurity - inability: Not on file  . Transportation needs - medical: Not on file  . Transportation needs - non-medical: Not on file  Occupational History  . Not on file  Tobacco Use  . Smoking status: Current Every Day Smoker  Years: 20.00    Types: Cigarettes  . Smokeless tobacco: Never Used  . Tobacco comment: Less than 1/2 pk per day  Substance and Sexual Activity  . Alcohol use: No    Comment: heavy drinker in past, none for 20 years  . Drug use: No  . Sexual activity: Not on file  Other Topics Concern  . Not on file  Social History Narrative  . Not on file    Allergies  Allergen Reactions  . Clindamycin/Lincomycin     Throat tightens up  . Metformin And Related Other (See Comments)    Effected kidney function    Current Outpatient Medications on File Prior to Visit  Medication Sig Dispense Refill  . aspirin EC 81 MG tablet Take 81 mg by mouth daily.    Marland Kitchen  aspirin-acetaminophen-caffeine (EXCEDRIN MIGRAINE) 250-250-65 MG tablet Take 1 tablet by mouth daily as needed for headache.    Marland Kitchen atenolol (TENORMIN) 50 MG tablet Take 50 mg by mouth 2 (two) times daily.    Marland Kitchen atorvastatin (LIPITOR) 40 MG tablet Take 20 mg by mouth at bedtime.     . gabapentin (NEURONTIN) 300 MG capsule Take 300 mg by mouth at bedtime.     Marland Kitchen glipiZIDE (GLUCOTROL) 10 MG tablet Take 10 mg by mouth 2 (two) times daily.    . pioglitazone (ACTOS) 15 MG tablet Take 15 mg by mouth daily.    . sertraline (ZOLOFT) 50 MG tablet Take 50 mg by mouth daily.    . traMADol (ULTRAM) 50 MG tablet Take 1 tablet (50 mg total) by mouth 3 (three) times daily as needed for moderate pain (arthritis). 8 tablet 0  . traMADol (ULTRAM) 50 MG tablet Take 1 tablet (50 mg total) by mouth every 12 (twelve) hours as needed for moderate pain. 6 tablet 0   No current facility-administered medications on file prior to visit.       Social History   Socioeconomic History  . Marital status: Single    Spouse name: Not on file  . Number of children: Not on file  . Years of education: Not on file  . Highest education level: Not on file  Social Needs  . Financial resource strain: Not on file  . Food insecurity - worry: Not on file  . Food insecurity - inability: Not on file  . Transportation needs - medical: Not on file  . Transportation needs - non-medical: Not on file  Occupational History  . Not on file  Tobacco Use  . Smoking status: Current Every Day Smoker    Years: 20.00    Types: Cigarettes  . Smokeless tobacco: Never Used  . Tobacco comment: Less than 1/2 pk per day  Substance and Sexual Activity  . Alcohol use: No    Comment: heavy drinker in past, none for 20 years  . Drug use: No  . Sexual activity: Not on file  Other Topics Concern  . Not on file  Social History Narrative  . Not on file    Current Outpatient Medications on File Prior to Visit  Medication Sig Dispense Refill  .  aspirin EC 81 MG tablet Take 81 mg by mouth daily.    Marland Kitchen aspirin-acetaminophen-caffeine (EXCEDRIN MIGRAINE) 250-250-65 MG tablet Take 1 tablet by mouth daily as needed for headache.    Marland Kitchen atenolol (TENORMIN) 50 MG tablet Take 50 mg by mouth 2 (two) times daily.    Marland Kitchen atorvastatin (LIPITOR) 40 MG tablet Take 20 mg by mouth at  bedtime.     . gabapentin (NEURONTIN) 300 MG capsule Take 300 mg by mouth at bedtime.     Marland Kitchen glipiZIDE (GLUCOTROL) 10 MG tablet Take 10 mg by mouth 2 (two) times daily.    . pioglitazone (ACTOS) 15 MG tablet Take 15 mg by mouth daily.    . sertraline (ZOLOFT) 50 MG tablet Take 50 mg by mouth daily.    . traMADol (ULTRAM) 50 MG tablet Take 1 tablet (50 mg total) by mouth 3 (three) times daily as needed for moderate pain (arthritis). 8 tablet 0  . traMADol (ULTRAM) 50 MG tablet Take 1 tablet (50 mg total) by mouth every 12 (twelve) hours as needed for moderate pain. 6 tablet 0   No current facility-administered medications on file prior to visit.     Physical Examination  Vitals:   04/01/17 1512 04/01/17 1515  BP: 109/67 (!) 145/86  Pulse: 82   Resp: 16   Temp: 98.9 F (37.2 C)   TempSrc: Oral   SpO2: 96%   Weight: 157 lb 3.2 oz (71.3 kg)   Height: 5\' 8"  (1.727 m)    Body mass index is 23.9 kg/m.    Left side of neck, healing CEA incision    Left side of neck: Incision is healing, small fluctuant mass at distal 2/3 of incision, expressed mass and purulent material exuded from about 3 areas of the incision.  Neuro: CN 2-12 are intact, Motor strength is 5/5 bilaterally, sensation is grossly intact.  Medical Decision Making  Adrian Andrews is a 67 y.o. male who is s/p left carotid endarterectomy and Dacron patch angioplasty on 02-21-17 by Dr. Donnetta Hutching for  symptomatic left internal carotid artery stenosis. He has not had any subsequent neurological events.   His concern today is a soft mass at the proximal aspect of his incision that started draining yellow/brown  material 4 days ago. He denies fever or chills. There is no erythema at the soft mass or at the incision (see above photo). I expressed the mass and purulent material exuded from about 3 areas of the incision.    Dr. Donnetta Hutching spoke with and examined pt  Suture abscess.  Keflex 500 mg po tid x 10 days, disp #30, 0 refills.  Follow up with Dr. Donnetta Hutching in a week for recheck of left side of neck incision.   The patient was counseled re smoking cessation and given several free resources re smoking cessation.   The patient's neck incision is healing with no stroke symptoms. I discussed in depth with the patient the nature of atherosclerosis, and emphasized the importance of maximal medical management including strict control of blood pressure, blood glucose, and lipid levels, obtaining regular exercise, anti-platelet use and cessation of smoking.   The patient is currently on an antiplatelet. The patient is currently on a statin.   The patient is aware that without maximal medical management the underlying atherosclerotic disease process will progress, limiting the benefit of any interventions.   Clemon Chambers, RN, MSN, FNP-C [facility] [place] Office: [phone]  04/01/2017, 4:01 PM  Clinic MD: Early

## 2017-04-01 NOTE — Patient Instructions (Signed)
Steps to Quit Smoking Smoking tobacco can be bad for your health. It can also affect almost every organ in your body. Smoking puts you and people around you at risk for many serious long-lasting (chronic) diseases. Quitting smoking is hard, but it is one of the best things that you can do for your health. It is never too late to quit. What are the benefits of quitting smoking? When you quit smoking, you lower your risk for getting serious diseases and conditions. They can include:  Lung cancer or lung disease.  Heart disease.  Stroke.  Heart attack.  Not being able to have children (infertility).  Weak bones (osteoporosis) and broken bones (fractures).  If you have coughing, wheezing, and shortness of breath, those symptoms may get better when you quit. You may also get sick less often. If you are pregnant, quitting smoking can help to lower your chances of having a baby of low birth weight. What can I do to help me quit smoking? Talk with your doctor about what can help you quit smoking. Some things you can do (strategies) include:  Quitting smoking totally, instead of slowly cutting back how much you smoke over a period of time.  Going to in-person counseling. You are more likely to quit if you go to many counseling sessions.  Using resources and support systems, such as: ? Online chats with a counselor. ? Phone quitlines. ? Printed self-help materials. ? Support groups or group counseling. ? Text messaging programs. ? Mobile phone apps or applications.  Taking medicines. Some of these medicines may have nicotine in them. If you are pregnant or breastfeeding, do not take any medicines to quit smoking unless your doctor says it is okay. Talk with your doctor about counseling or other things that can help you.  Talk with your doctor about using more than one strategy at the same time, such as taking medicines while you are also going to in-person counseling. This can help make  quitting easier. What things can I do to make it easier to quit? Quitting smoking might feel very hard at first, but there is a lot that you can do to make it easier. Take these steps:  Talk to your family and friends. Ask them to support and encourage you.  Call phone quitlines, reach out to support groups, or work with a counselor.  Ask people who smoke to not smoke around you.  Avoid places that make you want (trigger) to smoke, such as: ? Bars. ? Parties. ? Smoke-break areas at work.  Spend time with people who do not smoke.  Lower the stress in your life. Stress can make you want to smoke. Try these things to help your stress: ? Getting regular exercise. ? Deep-breathing exercises. ? Yoga. ? Meditating. ? Doing a body scan. To do this, close your eyes, focus on one area of your body at a time from head to toe, and notice which parts of your body are tense. Try to relax the muscles in those areas.  Download or buy apps on your mobile phone or tablet that can help you stick to your quit plan. There are many free apps, such as QuitGuide from the CDC (Centers for Disease Control and Prevention). You can find more support from smokefree.gov and other websites.  This information is not intended to replace advice given to you by your health care provider. Make sure you discuss any questions you have with your health care provider. Document Released: 10/27/2008 Document   Revised: 08/29/2015 Document Reviewed: 05/17/2014 Elsevier Interactive Patient Education  2018 Elsevier Inc.     Stroke Prevention Some health problems and behaviors may make it more likely for you to have a stroke. Below are ways to lessen your risk of having a stroke.  Be active for at least 30 minutes on most or all days.  Do not smoke. Try not to be around others who smoke.  Do not drink too much alcohol. ? Do not have more than 2 drinks a day if you are a man. ? Do not have more than 1 drink a day if you  are a woman and are not pregnant.  Eat healthy foods, such as fruits and vegetables. If you were put on a specific diet, follow the diet as told.  Keep your cholesterol levels under control through diet and medicines. Look for foods that are low in saturated fat, trans fat, cholesterol, and are high in fiber.  If you have diabetes, follow all diet plans and take your medicine as told.  Ask your doctor if you need treatment to lower your blood pressure. If you have high blood pressure (hypertension), follow all diet plans and take your medicine as told by your doctor.  If you are 18-39 years old, have your blood pressure checked every 3-5 years. If you are age 40 or older, have your blood pressure checked every year.  Keep a healthy weight. Eat foods that are low in calories, salt, saturated fat, trans fat, and cholesterol.  Do not take drugs.  Avoid birth control pills, if this applies. Talk to your doctor about the risks of taking birth control pills.  Talk to your doctor if you have sleep problems (sleep apnea).  Take all medicine as told by your doctor. ? You may be told to take aspirin or blood thinner medicine. Take this medicine as told by your doctor. ? Understand your medicine instructions.  Make sure any other conditions you have are being taken care of.  Get help right away if:  You suddenly lose feeling (you feel numb) or have weakness in your face, arm, or leg.  Your face or eyelid hangs down to one side.  You suddenly feel confused.  You have trouble talking (aphasia) or understanding what people are saying.  You suddenly have trouble seeing in one or both eyes.  You suddenly have trouble walking.  You are dizzy.  You lose your balance or your movements are clumsy (uncoordinated).  You suddenly have a very bad headache and you do not know the cause.  You have new chest pain.  Your heart feels like it is fluttering or skipping a beat (irregular  heartbeat). Do not wait to see if the symptoms above go away. Get help right away. Call your local emergency services (911 in [place] Do not drive yourself to the hospital. This information is not intended to replace advice given to you by your health care provider. Make sure you discuss any questions you have with your health care provider. Document Released: 07/02/2011 Document Revised: 06/08/2015 Document Reviewed: 07/03/2012 Elsevier Interactive Patient Education  2018 Elsevier Inc.  

## 2017-04-01 NOTE — Telephone Encounter (Signed)
Patient called c/o drainage from neck incision x 4 days. S/P CEA. Started clear but now darker and worse in the morning (first getting up) No dressing but shirt gets wet with drainage. Instructed to wash hands before touching neck and cover area with clean wash clothe. Will see NP today for wound check.

## 2017-04-08 ENCOUNTER — Ambulatory Visit: Payer: Medicare Other | Admitting: Vascular Surgery

## 2017-04-09 ENCOUNTER — Ambulatory Visit: Payer: Medicare Other | Admitting: Diagnostic Neuroimaging

## 2017-05-20 ENCOUNTER — Ambulatory Visit: Payer: Medicare Other | Admitting: Vascular Surgery

## 2017-06-06 ENCOUNTER — Encounter: Payer: Self-pay | Admitting: *Deleted

## 2017-06-10 ENCOUNTER — Ambulatory Visit: Payer: Medicare Other | Admitting: Diagnostic Neuroimaging

## 2017-06-25 ENCOUNTER — Ambulatory Visit: Payer: Medicare Other | Admitting: Neurology

## 2017-07-21 DIAGNOSIS — F17200 Nicotine dependence, unspecified, uncomplicated: Secondary | ICD-10-CM | POA: Diagnosis not present

## 2017-07-21 DIAGNOSIS — I6529 Occlusion and stenosis of unspecified carotid artery: Secondary | ICD-10-CM | POA: Diagnosis not present

## 2017-07-21 DIAGNOSIS — E785 Hyperlipidemia, unspecified: Secondary | ICD-10-CM | POA: Diagnosis not present

## 2017-07-21 DIAGNOSIS — F419 Anxiety disorder, unspecified: Secondary | ICD-10-CM | POA: Diagnosis not present

## 2017-07-21 DIAGNOSIS — E1151 Type 2 diabetes mellitus with diabetic peripheral angiopathy without gangrene: Secondary | ICD-10-CM | POA: Diagnosis not present

## 2017-07-21 DIAGNOSIS — N183 Chronic kidney disease, stage 3 (moderate): Secondary | ICD-10-CM | POA: Diagnosis not present

## 2017-07-21 DIAGNOSIS — G458 Other transient cerebral ischemic attacks and related syndromes: Secondary | ICD-10-CM | POA: Diagnosis not present

## 2017-07-21 DIAGNOSIS — I129 Hypertensive chronic kidney disease with stage 1 through stage 4 chronic kidney disease, or unspecified chronic kidney disease: Secondary | ICD-10-CM | POA: Diagnosis not present

## 2017-07-21 DIAGNOSIS — E1140 Type 2 diabetes mellitus with diabetic neuropathy, unspecified: Secondary | ICD-10-CM | POA: Diagnosis not present

## 2017-07-21 DIAGNOSIS — E1121 Type 2 diabetes mellitus with diabetic nephropathy: Secondary | ICD-10-CM | POA: Diagnosis not present

## 2017-08-12 ENCOUNTER — Ambulatory Visit: Payer: Medicare Other | Admitting: Diagnostic Neuroimaging

## 2017-09-23 ENCOUNTER — Ambulatory Visit: Payer: Medicare Other | Admitting: Vascular Surgery

## 2017-09-23 ENCOUNTER — Encounter (HOSPITAL_COMMUNITY): Payer: Medicare Other

## 2017-12-20 ENCOUNTER — Encounter (HOSPITAL_COMMUNITY): Payer: Self-pay

## 2017-12-20 ENCOUNTER — Inpatient Hospital Stay (HOSPITAL_COMMUNITY)
Admission: EM | Admit: 2017-12-20 | Discharge: 2017-12-23 | DRG: 190 | Disposition: A | Discharge status: Home / Self Care | Payer: Medicare Other | Attending: Internal Medicine | Admitting: Internal Medicine

## 2017-12-20 ENCOUNTER — Other Ambulatory Visit: Payer: Self-pay

## 2017-12-20 ENCOUNTER — Emergency Department (HOSPITAL_COMMUNITY): Payer: Medicare Other

## 2017-12-20 DIAGNOSIS — E1159 Type 2 diabetes mellitus with other circulatory complications: Secondary | ICD-10-CM

## 2017-12-20 DIAGNOSIS — Z8673 Personal history of transient ischemic attack (TIA), and cerebral infarction without residual deficits: Secondary | ICD-10-CM

## 2017-12-20 DIAGNOSIS — R0902 Hypoxemia: Secondary | ICD-10-CM | POA: Diagnosis not present

## 2017-12-20 DIAGNOSIS — I5031 Acute diastolic (congestive) heart failure: Secondary | ICD-10-CM

## 2017-12-20 DIAGNOSIS — E785 Hyperlipidemia, unspecified: Secondary | ICD-10-CM | POA: Diagnosis present

## 2017-12-20 DIAGNOSIS — R0600 Dyspnea, unspecified: Secondary | ICD-10-CM

## 2017-12-20 DIAGNOSIS — F17210 Nicotine dependence, cigarettes, uncomplicated: Secondary | ICD-10-CM | POA: Diagnosis present

## 2017-12-20 DIAGNOSIS — Z85828 Personal history of other malignant neoplasm of skin: Secondary | ICD-10-CM | POA: Diagnosis not present

## 2017-12-20 DIAGNOSIS — J189 Pneumonia, unspecified organism: Secondary | ICD-10-CM | POA: Diagnosis not present

## 2017-12-20 DIAGNOSIS — R0602 Shortness of breath: Secondary | ICD-10-CM | POA: Diagnosis not present

## 2017-12-20 DIAGNOSIS — J9601 Acute respiratory failure with hypoxia: Secondary | ICD-10-CM

## 2017-12-20 DIAGNOSIS — E861 Hypovolemia: Secondary | ICD-10-CM | POA: Diagnosis present

## 2017-12-20 DIAGNOSIS — I509 Heart failure, unspecified: Secondary | ICD-10-CM

## 2017-12-20 DIAGNOSIS — M1611 Unilateral primary osteoarthritis, right hip: Secondary | ICD-10-CM | POA: Diagnosis present

## 2017-12-20 DIAGNOSIS — F419 Anxiety disorder, unspecified: Secondary | ICD-10-CM | POA: Diagnosis present

## 2017-12-20 DIAGNOSIS — T502X5A Adverse effect of carbonic-anhydrase inhibitors, benzothiadiazides and other diuretics, initial encounter: Secondary | ICD-10-CM | POA: Diagnosis not present

## 2017-12-20 DIAGNOSIS — E1151 Type 2 diabetes mellitus with diabetic peripheral angiopathy without gangrene: Secondary | ICD-10-CM | POA: Diagnosis present

## 2017-12-20 DIAGNOSIS — J440 Chronic obstructive pulmonary disease with acute lower respiratory infection: Secondary | ICD-10-CM | POA: Diagnosis not present

## 2017-12-20 DIAGNOSIS — R062 Wheezing: Secondary | ICD-10-CM | POA: Diagnosis not present

## 2017-12-20 DIAGNOSIS — M479 Spondylosis, unspecified: Secondary | ICD-10-CM | POA: Diagnosis present

## 2017-12-20 DIAGNOSIS — Z7982 Long term (current) use of aspirin: Secondary | ICD-10-CM

## 2017-12-20 DIAGNOSIS — Z79899 Other long term (current) drug therapy: Secondary | ICD-10-CM

## 2017-12-20 DIAGNOSIS — Z881 Allergy status to other antibiotic agents status: Secondary | ICD-10-CM

## 2017-12-20 DIAGNOSIS — J9621 Acute and chronic respiratory failure with hypoxia: Secondary | ICD-10-CM | POA: Diagnosis present

## 2017-12-20 DIAGNOSIS — R069 Unspecified abnormalities of breathing: Secondary | ICD-10-CM | POA: Diagnosis not present

## 2017-12-20 DIAGNOSIS — F329 Major depressive disorder, single episode, unspecified: Secondary | ICD-10-CM | POA: Diagnosis present

## 2017-12-20 DIAGNOSIS — N179 Acute kidney failure, unspecified: Secondary | ICD-10-CM | POA: Diagnosis not present

## 2017-12-20 DIAGNOSIS — J441 Chronic obstructive pulmonary disease with (acute) exacerbation: Secondary | ICD-10-CM | POA: Diagnosis present

## 2017-12-20 DIAGNOSIS — Z7984 Long term (current) use of oral hypoglycemic drugs: Secondary | ICD-10-CM

## 2017-12-20 DIAGNOSIS — E1142 Type 2 diabetes mellitus with diabetic polyneuropathy: Secondary | ICD-10-CM | POA: Diagnosis present

## 2017-12-20 DIAGNOSIS — I10 Essential (primary) hypertension: Secondary | ICD-10-CM

## 2017-12-20 DIAGNOSIS — Z888 Allergy status to other drugs, medicaments and biological substances status: Secondary | ICD-10-CM

## 2017-12-20 DIAGNOSIS — E1122 Type 2 diabetes mellitus with diabetic chronic kidney disease: Secondary | ICD-10-CM | POA: Diagnosis present

## 2017-12-20 DIAGNOSIS — I130 Hypertensive heart and chronic kidney disease with heart failure and stage 1 through stage 4 chronic kidney disease, or unspecified chronic kidney disease: Secondary | ICD-10-CM | POA: Diagnosis present

## 2017-12-20 DIAGNOSIS — R011 Cardiac murmur, unspecified: Secondary | ICD-10-CM | POA: Diagnosis present

## 2017-12-20 DIAGNOSIS — E875 Hyperkalemia: Secondary | ICD-10-CM | POA: Diagnosis present

## 2017-12-20 DIAGNOSIS — N183 Chronic kidney disease, stage 3 (moderate): Secondary | ICD-10-CM | POA: Diagnosis present

## 2017-12-20 LAB — CBC WITH DIFFERENTIAL/PLATELET
Abs Immature Granulocytes: 0.05 10*3/uL (ref 0.00–0.07)
Basophils Absolute: 0 10*3/uL (ref 0.0–0.1)
Basophils Relative: 0 %
EOS PCT: 1 %
Eosinophils Absolute: 0.1 10*3/uL (ref 0.0–0.5)
HCT: 32.8 % — ABNORMAL LOW (ref 39.0–52.0)
Hemoglobin: 11.3 g/dL — ABNORMAL LOW (ref 13.0–17.0)
Immature Granulocytes: 1 %
Lymphocytes Relative: 7 %
Lymphs Abs: 0.5 10*3/uL — ABNORMAL LOW (ref 0.7–4.0)
MCH: 33.6 pg (ref 26.0–34.0)
MCHC: 34.5 g/dL (ref 30.0–36.0)
MCV: 97.6 fL (ref 80.0–100.0)
MONO ABS: 0.3 10*3/uL (ref 0.1–1.0)
MONOS PCT: 4 %
Neutro Abs: 6.7 10*3/uL (ref 1.7–7.7)
Neutrophils Relative %: 87 %
PLATELETS: 163 10*3/uL (ref 150–400)
RBC: 3.36 MIL/uL — AB (ref 4.22–5.81)
RDW: 14.5 % (ref 11.5–15.5)
WBC: 7.7 10*3/uL (ref 4.0–10.5)
nRBC: 0 % (ref 0.0–0.2)

## 2017-12-20 LAB — BASIC METABOLIC PANEL
Anion gap: 9 (ref 5–15)
BUN: 27 mg/dL — AB (ref 8–23)
CO2: 24 mmol/L (ref 22–32)
CREATININE: 1.21 mg/dL (ref 0.61–1.24)
Calcium: 8.9 mg/dL (ref 8.9–10.3)
Chloride: 99 mmol/L (ref 98–111)
GFR calc Af Amer: >60 mL/min (ref >60)
GLUCOSE: 236 mg/dL — AB (ref 70–99)
Potassium: 4.1 mmol/L (ref 3.5–5.1)
SODIUM: 132 mmol/L — AB (ref 135–145)

## 2017-12-20 LAB — I-STAT CG4 LACTIC ACID, ED
Lactic Acid, Venous: 2.78 mmol/L (ref 0.5–1.9)
Lactic Acid, Venous: 5.92 mmol/L (ref 0.5–1.9)

## 2017-12-20 LAB — URINALYSIS, ROUTINE W REFLEX MICROSCOPIC
BILIRUBIN URINE: NEGATIVE
Bacteria, UA: NONE SEEN
Ketones, ur: NEGATIVE mg/dL
Leukocytes, UA: NEGATIVE
Nitrite: NEGATIVE
Protein, ur: 30 mg/dL — AB
Specific Gravity, Urine: 1.007 (ref 1.005–1.030)
pH: 6 (ref 5.0–8.0)

## 2017-12-20 LAB — GLUCOSE, CAPILLARY
Glucose-Capillary: 342 mg/dL — ABNORMAL HIGH (ref 70–99)
Glucose-Capillary: 285 mg/dL — ABNORMAL HIGH (ref 70–99)

## 2017-12-20 MED ORDER — INSULIN ASPART 100 UNIT/ML ~~LOC~~ SOLN
0.0000 [IU] | Freq: Three times a day (TID) | SUBCUTANEOUS | Status: DC
  Administered 2017-12-20: 7 [IU] via SUBCUTANEOUS
  Administered 2017-12-21: 3 [IU] via SUBCUTANEOUS

## 2017-12-20 MED ORDER — AZITHROMYCIN 250 MG PO TABS
500.0000 mg | ORAL_TABLET | Freq: Every day | ORAL | Status: DC
  Administered 2017-12-21 – 2017-12-23 (×3): 500 mg via ORAL
  Filled 2017-12-20 (×3): qty 2

## 2017-12-20 MED ORDER — IPRATROPIUM-ALBUTEROL 0.5-2.5 (3) MG/3ML IN SOLN
3.0000 mL | Freq: Four times a day (QID) | RESPIRATORY_TRACT | Status: DC
  Administered 2017-12-20: 3 mL via RESPIRATORY_TRACT
  Filled 2017-12-20: qty 3

## 2017-12-20 MED ORDER — FUROSEMIDE 10 MG/ML IJ SOLN
40.0000 mg | Freq: Once | INTRAMUSCULAR | Status: AC
  Administered 2017-12-20: 40 mg via INTRAVENOUS
  Filled 2017-12-20: qty 4

## 2017-12-20 MED ORDER — ACETAMINOPHEN 650 MG RE SUPP: 650.0000 mg | Freq: Four times a day (QID) | RECTAL | Status: DC | PRN

## 2017-12-20 MED ORDER — LORAZEPAM 2 MG/ML IJ SOLN
0.5000 mg | Freq: Once | INTRAMUSCULAR | Status: AC
  Administered 2017-12-20: 0.5 mg via INTRAVENOUS
  Filled 2017-12-20: qty 1

## 2017-12-20 MED ORDER — ASPIRIN EC 81 MG PO TBEC
81.0000 mg | DELAYED_RELEASE_TABLET | Freq: Every day | ORAL | Status: DC
  Administered 2017-12-20 – 2017-12-23 (×4): 81 mg via ORAL
  Filled 2017-12-20 (×4): qty 1

## 2017-12-20 MED ORDER — IPRATROPIUM-ALBUTEROL 0.5-2.5 (3) MG/3ML IN SOLN: 3.0000 mL | RESPIRATORY_TRACT | Status: DC | PRN

## 2017-12-20 MED ORDER — ONDANSETRON HCL 4 MG PO TABS: 4.0000 mg | ORAL_TABLET | Freq: Four times a day (QID) | ORAL | Status: DC | PRN

## 2017-12-20 MED ORDER — IPRATROPIUM-ALBUTEROL 0.5-2.5 (3) MG/3ML IN SOLN
3.0000 mL | Freq: Three times a day (TID) | RESPIRATORY_TRACT | Status: DC
  Administered 2017-12-21 (×3): 3 mL via RESPIRATORY_TRACT
  Filled 2017-12-20 (×3): qty 3

## 2017-12-20 MED ORDER — ACETAMINOPHEN 325 MG PO TABS
650.0000 mg | ORAL_TABLET | Freq: Four times a day (QID) | ORAL | Status: DC | PRN
  Administered 2017-12-21: 650 mg via ORAL
  Filled 2017-12-20: qty 2

## 2017-12-20 MED ORDER — SODIUM CHLORIDE 0.9 % IV SOLN
1.0000 g | INTRAVENOUS | Status: DC
  Administered 2017-12-21 – 2017-12-23 (×3): 1 g via INTRAVENOUS
  Filled 2017-12-20 (×3): qty 1

## 2017-12-20 MED ORDER — ALBUTEROL (5 MG/ML) CONTINUOUS INHALATION SOLN
10.0000 mg/h | INHALATION_SOLUTION | Freq: Once | RESPIRATORY_TRACT | Status: AC
  Administered 2017-12-20: 10 mg/h via RESPIRATORY_TRACT
  Filled 2017-12-20: qty 20

## 2017-12-20 MED ORDER — SERTRALINE HCL 50 MG PO TABS
50.0000 mg | ORAL_TABLET | Freq: Every day | ORAL | Status: DC
  Administered 2017-12-20 – 2017-12-23 (×4): 50 mg via ORAL
  Filled 2017-12-20 (×4): qty 1

## 2017-12-20 MED ORDER — SODIUM CHLORIDE 0.9 % IV SOLN
INTRAVENOUS | Status: DC
  Administered 2017-12-20: 12:00:00 via INTRAVENOUS

## 2017-12-20 MED ORDER — ATORVASTATIN CALCIUM 20 MG PO TABS
20.0000 mg | ORAL_TABLET | Freq: Every day | ORAL | Status: DC
  Administered 2017-12-20 – 2017-12-22 (×3): 20 mg via ORAL
  Filled 2017-12-20 (×3): qty 1

## 2017-12-20 MED ORDER — SODIUM CHLORIDE 0.9 % IV SOLN
500.0000 mg | INTRAVENOUS | Status: DC
  Administered 2017-12-20: 500 mg via INTRAVENOUS
  Filled 2017-12-20: qty 500

## 2017-12-20 MED ORDER — ATENOLOL 50 MG PO TABS
50.0000 mg | ORAL_TABLET | Freq: Two times a day (BID) | ORAL | Status: DC
  Administered 2017-12-20 – 2017-12-23 (×6): 50 mg via ORAL
  Filled 2017-12-20 (×6): qty 1

## 2017-12-20 MED ORDER — ENOXAPARIN SODIUM 40 MG/0.4ML ~~LOC~~ SOLN
40.0000 mg | Freq: Every day | SUBCUTANEOUS | Status: DC
  Administered 2017-12-20 – 2017-12-22 (×2): 40 mg via SUBCUTANEOUS
  Filled 2017-12-20 (×3): qty 0.4

## 2017-12-20 MED ORDER — GABAPENTIN 300 MG PO CAPS
300.0000 mg | ORAL_CAPSULE | Freq: Every day | ORAL | Status: DC
  Administered 2017-12-20 – 2017-12-22 (×3): 300 mg via ORAL
  Filled 2017-12-20 (×3): qty 1

## 2017-12-20 MED ORDER — SODIUM CHLORIDE 0.9 % IV SOLN
2.0000 g | INTRAVENOUS | Status: DC
  Administered 2017-12-20: 2 g via INTRAVENOUS
  Filled 2017-12-20: qty 20

## 2017-12-20 MED ORDER — ONDANSETRON HCL 4 MG/2ML IJ SOLN: 4.0000 mg | Freq: Four times a day (QID) | INTRAMUSCULAR | Status: DC | PRN

## 2017-12-20 MED ORDER — TRAMADOL HCL 50 MG PO TABS
50.0000 mg | ORAL_TABLET | Freq: Three times a day (TID) | ORAL | Status: DC | PRN
  Administered 2017-12-20 – 2017-12-23 (×9): 50 mg via ORAL
  Filled 2017-12-20 (×9): qty 1

## 2017-12-20 NOTE — ED Notes (Signed)
I stat lactic at 2035 was duplicate and that why it was click off.

## 2017-12-20 NOTE — Progress Notes (Signed)
CRITICAL VALUE ALERT  Critical Value:  Lactic of 5.92   Date & Time Notied:  12/7 @1820   Provider Notified: Arrien  Orders Received/Actions taken:

## 2017-12-20 NOTE — H&P (Signed)
History and Physical    Adrian Andrews IEP:[id number] DOB: 12/17/1950 DOA: 12/20/2017  PCP: [name], MD   Patient coming from: Home   Chief Complaint: dyspnea   HPI: Adrian Andrews is a 67 y.o. male with medical history significant of history of type 2 diabetes mellitus, hypertension, chronic kidney disease stage III, anxiety and tobacco abuse.  Patient presents with a 3 days history of worsening dyspnea, moderate to severe intensity, to the point where he is dyspneic with minimal efforts, no improving factors, associated with productive cough, chills, generalized malaise and decreased appetite.  Due to worsening symptoms he decided to come to the hospital for further evaluation.  He does not have any bronchodilators at home, he continues to smoke cigarettes.  Few days before his symptoms started, he had been working in his yard, raking leaves and doing further light work.   ED Course: Patient was noted dyspneic, ill looking appearing, his chest radiograph was suggestive for pneumonia, he received antibiotics and he was referred for further admission evaluation.  Review of Systems:  1. General: No fevers, but chills, no weight gain or weight loss 2. ENT: No runny nose or sore throat, no hearing disturbances 3. Pulmonary: Positive dyspnea, cough, no wheezing, or hemoptysis 4. Cardiovascular: No angina, claudication, lower extremity edema, pnd or orthopnea 5. Gastrointestinal: No nausea or vomiting, no diarrhea or constipation 6. Hematology: No easy bruisability or frequent infections 7. Urology: No dysuria, hematuria or increased urinary frequency 8. Dermatology: No rashes. 9. Neurology: No seizures or paresthesias 10. Musculoskeletal: No joint pain or deformities  Past Medical History:  Diagnosis Date  . Anxiety   . Arthritis    back, right hip  . Chronic kidney disease    stage 3  . Diabetes mellitus without complication ([place])   . Dyspnea   . Heart murmur   .  Hyperlipidemia   . Hypertension   . Neuropathy   . Peripheral vascular disease ([place])    Carotid artery stenosis, s/p bilat stents  . Skin cancer    basal cell right cheek, left ear  . Stroke [facility])    noted on MRI    Past Surgical History:  Procedure Laterality Date  . ENDARTERECTOMY Left 02/21/2017   Procedure: LEFT CAROTID ENDARTERECTOMY;  Surgeon: Rosetta Posner, MD;  Location: [facility] OR;  Service: Vascular;  Laterality: Left;  . lower extremity stenting    . PATCH ANGIOPLASTY Left 02/21/2017   Procedure: PATCH ANGIOPLASTY OF LEFT CAROTID ARTERY USING HEMASHIELD PLATINUM FINESSE PATCH;  Surgeon: Rosetta Posner, MD;  Location: [place];  Service: Vascular;  Laterality: Left;     reports that he has been smoking cigarettes. He has smoked for the past 20.00 years. He has never used smokeless tobacco. He reports that he does not drink alcohol or use drugs.  Allergies  Allergen Reactions  . Clindamycin/Lincomycin     Throat tightens up  . Metformin And Related Other (See Comments)    Effected kidney function    No family history on file.   Prior to Admission medications   Medication Sig Start Date End Date Taking? Authorizing Provider  aspirin EC 81 MG tablet Take 81 mg by mouth daily.   Yes [provider]  atenolol (TENORMIN) 50 MG tablet Take 50 mg by mouth 2 (two) times daily.   Yes [provider]  atorvastatin (LIPITOR) 40 MG tablet Take 20 mg by mouth at bedtime.    Yes [provider]  gabapentin (  NEURONTIN) 300 MG capsule Take 300 mg by mouth at bedtime.    Yes [provider]  glipiZIDE (GLUCOTROL) 10 MG tablet Take 10 mg by mouth 2 (two) times daily. 12/28/16  Yes [provider]  sertraline (ZOLOFT) 50 MG tablet Take 50 mg by mouth daily.   Yes [provider]  traMADol (ULTRAM) 50 MG tablet Take 1 tablet (50 mg total) by mouth 3 (three) times daily as needed for moderate pain (arthritis). 02/21/17  Yes Rhyne, Hulen Shouts, PA-C      Physical Exam: Vitals:   12/20/17 1015  BP: 134/73  Pulse: 95  Temp: 97.8 F (36.6 C)  SpO2: 95%    Vitals:   12/20/17 1015  BP: 134/73  Pulse: 95  Temp: 97.8 F (36.6 C)  SpO2: 95%   General: deconditioned and ill looking appearing Neurology: Awake and alert, non focal Head and Neck. Head normocephalic. Neck supple with no adenopathy or thyromegaly. Mild accessory muscle use.  E ENT: mild pallor, no icterus, oral mucosa moist Cardiovascular: No JVD. S1-S2 present, rhythmic, no gallops, rubs, or murmurs. No lower extremity edema. Pulmonary: decrased breath sounds bilaterally, decreased air movement, no wheezing, scattered rhonchi and rales. Gastrointestinal. Abdomen with no organomegaly, non tender, no rebound or guarding Skin. No rashes Musculoskeletal: no joint deformities    Labs on Admission: I have personally reviewed following labs and imaging studies  CBC: Recent Labs  Lab 12/20/17 1050  WBC 7.7  NEUTROABS 6.7  HGB 11.3*  HCT 32.8*  MCV 97.6  PLT 810   Basic Metabolic Panel: Recent Labs  Lab 12/20/17 1050  NA 132*  K 4.1  CL 99  CO2 24  GLUCOSE 236*  BUN 27*  CREATININE 1.21  CALCIUM 8.9   GFR: CrCl cannot be calculated (Unknown ideal weight.). Liver Function Tests: No results for input(s): AST, ALT, ALKPHOS, BILITOT, PROT, ALBUMIN in the last 168 hours. No results for input(s): LIPASE, AMYLASE in the last 168 hours. No results for input(s): AMMONIA in the last 168 hours. Coagulation Profile: No results for input(s): INR, PROTIME in the last 168 hours. Cardiac Enzymes: No results for input(s): CKTOTAL, CKMB, CKMBINDEX, TROPONINI in the last 168 hours. BNP (last 3 results) No results for input(s): PROBNP in the last 8760 hours. HbA1C: No results for input(s): HGBA1C in the last 72 hours. CBG: No results for input(s): GLUCAP in the last 168 hours. Lipid Profile: No results for input(s): CHOL, HDL, LDLCALC, TRIG, CHOLHDL, LDLDIRECT in  the last 72 hours. Thyroid Function Tests: No results for input(s): TSH, T4TOTAL, FREET4, T3FREE, THYROIDAB in the last 72 hours. Anemia Panel: No results for input(s): VITAMINB12, FOLATE, FERRITIN, TIBC, IRON, RETICCTPCT in the last 72 hours. Urine analysis:    Component Value Date/Time   COLORURINE YELLOW 02/19/2017 1118   APPEARANCEUR CLEAR 02/19/2017 1118   LABSPEC 1.014 02/19/2017 1118   PHURINE 6.0 02/19/2017 1118   GLUCOSEU >=500 (A) 02/19/2017 1118   HGBUR NEGATIVE 02/19/2017 1118   BILIRUBINUR NEGATIVE 02/19/2017 1118   BILIRUBINUR neg 09/10/2013 1308   KETONESUR NEGATIVE 02/19/2017 1118   PROTEINUR 30 (A) 02/19/2017 1118   UROBILINOGEN 0.2 09/10/2013 1308   NITRITE NEGATIVE 02/19/2017 1118   LEUKOCYTESUR NEGATIVE 02/19/2017 1118    Radiological Exams on Admission: Dg Chest Port 1 View  Result Date: 12/20/2017 CLINICAL DATA:  Increasing shortness of breath EXAM: PORTABLE CHEST 1 VIEW COMPARISON:  09/10/2013 FINDINGS: Diffuse bilateral mild interstitial thickening with patchy alveolar airspace opacities. No pleural effusion or  pneumothorax. Stable cardiomediastinal silhouette. No acute osseous abnormality. Mild osteoarthritis of bilateral glenohumeral joints. IMPRESSION: Diffuse bilateral mild interstitial thickening and patchy alveolar airspace opacities. Differential considerations include mild pulmonary edema versus pneumonia. Electronically Signed   By: Kathreen Devoid   On: 12/20/2017 11:36    EKG: Independently reviewed. NA  Assessment/Plan Active Problems:   COPD exacerbation ([place])   67 year old male with history of diabetes, hypertension and tobacco abuse who presents with 3 days of worsening dyspnea, associated with cough and chills.  He was exposed to the cold air while working in his yard a few days before his symptoms started.  On his physical examination he looks dyspneic, temperature 97.8, blood pressure 134/73, heart rate 95, oxygen saturation 95 on  supplemental oxygen.  He has mild accessory muscle use, he has decreased breath sounds bilaterally, scattered rhonchi and rales, heart S1-S2 present, rhythmic, no gallops, rubs or murmurs abdomen soft nontender, no lower extremity edema.  Sodium 132, potassium 4.1, chloride 99, bicarb 24, glucose 236, BUN 27, creatinine 1.21, venous lactic acid 2.7, white count 7.7, hemoglobin 11.3, hematocrit 32.8, platelets 163, urinalysis negative for infection, his chest radiograph has bilateral interstitial markings at bases with cephalization of the vasculature, slightly more predominant at the right lower lobe interstitial infiltrates.   Patient will be admitted to the hospital with a working diagnosis of impending acute hypoxic respiratory failure due to suspected right lower lobe pneumonia, to rule out pulmonary edema.  1.  Right lower lobe pneumonia, to rule out pulmonary edema.  Patient will be admitted to the medical ward, he will be placed on a remote telemetry monitor, antibiotic therapy with ceftriaxone and azithromycin.  Bronchodilator therapy with DuoNeb scheduled and as needed.  Follow-up on cell count, temperature curve and cultures.  His chest radiograph has a pattern suggestive of pulmonary edema, will give 1 dose of furosemide, for possible acute diastolic heart failure decompensation, further work-up with echocardiography.  Strict in and outs and daily weights.  He may have a component of hyperreactive airway along with COPD in the setting of tobacco abuse.  Follow-up chest radiograph in the morning after diuresis, if no improvement of his symptoms he may benefit from systemic steroids.  2.  Hypertension.  Continue atenolol 50 mg twice daily.  Will do a trial of furosemide, check electrocardiogram and echocardiogram.  3.  Type 2 diabetes mellitus.  Hold on glipizide for now, will use insulin sliding scale for glucose coverage and monitoring.  Calculate insulin requirements before using long-acting  insulin.  4.  Dyslipidemia.  Continue atorvastatin.  5.  Depression.  Continue sertraline.   DVT prophylaxis: enoxaparin  Code Status: Full Family Communication: no family at the bedside   Disposition Plan: telemetry   Consults called: none   Admission status: Inpatient.     Mauricio Gerome Apley MD [facility] Pager [phone]  If 7PM-7AM, please contact night-coverage [email or web address] Password [facility]  12/20/2017, 12:24 PM

## 2017-12-20 NOTE — ED Provider Notes (Signed)
[place] DEPT Provider Note   CSN: [id number] Arrival date & time: 12/20/17  1001     History   Chief Complaint Chief Complaint  Patient presents with  . Shortness of Breath    HPI Adrian Andrews is a 67 y.o. male.  HPI  Past Medical History:  Diagnosis Date  . Anxiety   . Arthritis    back, right hip  . Chronic kidney disease    stage 3  . Diabetes mellitus without complication ([place])   . Dyspnea   . Heart murmur   . Hyperlipidemia   . Hypertension   . Neuropathy   . Peripheral vascular disease ([place])    Carotid artery stenosis, s/p bilat stents  . Skin cancer    basal cell right cheek, left ear  . Stroke [facility])    noted on MRI    Patient Active Problem List   Diagnosis Date Noted  . Carotid artery stenosis, symptomatic, left 02/21/2017    Past Surgical History:  Procedure Laterality Date  . ENDARTERECTOMY Left 02/21/2017   Procedure: LEFT CAROTID ENDARTERECTOMY;  Surgeon: Rosetta Posner, MD;  Location: [facility] OR;  Service: Vascular;  Laterality: Left;  . lower extremity stenting    . PATCH ANGIOPLASTY Left 02/21/2017   Procedure: PATCH ANGIOPLASTY OF LEFT CAROTID ARTERY USING HEMASHIELD PLATINUM FINESSE PATCH;  Surgeon: Rosetta Posner, MD;  Location: [facility] OR;  Service: Vascular;  Laterality: Left;        Home Medications    Prior to Admission medications   Medication Sig Start Date End Date Taking? Authorizing Provider  aspirin EC 81 MG tablet Take 81 mg by mouth daily.   Yes [provider]  atenolol (TENORMIN) 50 MG tablet Take 50 mg by mouth 2 (two) times daily.   Yes [provider]  atorvastatin (LIPITOR) 40 MG tablet Take 20 mg by mouth at bedtime.    Yes [provider]  gabapentin (NEURONTIN) 300 MG capsule Take 300 mg by mouth at bedtime.    Yes [provider]  glipiZIDE (GLUCOTROL) 10 MG tablet Take 10 mg by mouth 2 (two) times daily. 12/28/16  Yes [provider]    sertraline (ZOLOFT) 50 MG tablet Take 50 mg by mouth daily.   Yes [provider]  traMADol (ULTRAM) 50 MG tablet Take 1 tablet (50 mg total) by mouth 3 (three) times daily as needed for moderate pain (arthritis). 02/21/17  Yes Rhyne, Hulen Shouts, PA-C    Family History No family history on file.  Social History Social History   Tobacco Use  . Smoking status: Current Every Day Smoker    Years: 20.00    Types: Cigarettes  . Smokeless tobacco: Never Used  . Tobacco comment: Less than 1/2 pk per day  Substance Use Topics  . Alcohol use: No    Comment: heavy drinker in past, none for 20 years  . Drug use: No     Allergies   Clindamycin/lincomycin and Metformin and related   Review of Systems Review of Systems  All other systems reviewed and are negative.    Physical Exam Updated Vital Signs BP 134/73   Pulse 95   Temp 97.8 F (36.6 C)   SpO2 95%   Physical Exam  Constitutional: He is oriented to person, place, and time. He appears well-developed and well-nourished.  Non-toxic appearance. No distress.  HENT:  Head: Normocephalic and atraumatic.  Eyes: Pupils are equal, round, and reactive to light.  Conjunctivae, EOM and lids are normal.  Neck: Normal range of motion. Neck supple. No tracheal deviation present. No thyroid mass present.  Cardiovascular: Normal rate, regular rhythm and normal heart sounds. Exam reveals no gallop.  No murmur heard. Pulmonary/Chest: Effort normal. No stridor. No respiratory distress. He has decreased breath sounds in the right lower field and the left lower field. He has wheezes in the right lower field and the left lower field. He has no rhonchi. He has no rales.  Abdominal: Soft. Normal appearance and bowel sounds are normal. He exhibits no distension. There is no tenderness. There is no rebound and no CVA tenderness.  Musculoskeletal: Normal range of motion. He exhibits no edema or tenderness.  Neurological: He is alert and  oriented to person, place, and time. He has normal strength. No cranial nerve deficit or sensory deficit. GCS eye subscore is 4. GCS verbal subscore is 5. GCS motor subscore is 6.  Skin: Skin is warm and dry. No abrasion and no rash noted.  Psychiatric: He has a normal mood and affect. His speech is normal and behavior is normal.  Nursing note and vitals reviewed.    ED Treatments / Results  Labs (all labs ordered are listed, but only abnormal results are displayed) Labs Reviewed - No data to display  EKG None  Radiology No results found.  Procedures Procedures (including critical care time)  Medications Ordered in ED Medications  0.9 %  sodium chloride infusion (has no administration in time range)  albuterol (PROVENTIL,VENTOLIN) solution continuous neb (has no administration in time range)  LORazepam (ATIVAN) injection 0.5 mg (has no administration in time range)     Initial Impression / Assessment and Plan / ED Course  I have reviewed the triage vital signs and the nursing notes.  Pertinent labs & imaging results that were available during my care of the patient were reviewed by me and considered in my medical decision making (see chart for details).     Patient with evidence of pneumonia on his x-ray.  Patient given albuterol 10 mg continuous treatment.  Started on antibiotics.  He is able to protect his airway.  Will be admitted to the medicine service  CRITICAL CARE Performed by: Leota Jacobsen Total critical care time: 55 minutes Critical care time was exclusive of separately billable procedures and treating other patients. Critical care was necessary to treat or prevent imminent or life-threatening deterioration. Critical care was time spent personally by me on the following activities: development of treatment plan with patient and/or surrogate as well as nursing, discussions with consultants, evaluation of patient's response to treatment, examination of patient,  obtaining history from patient or surrogate, ordering and performing treatments and interventions, ordering and review of laboratory studies, ordering and review of radiographic studies, pulse oximetry and re-evaluation of patient's condition.   Final Clinical Impressions(s) / ED Diagnoses   Final diagnoses:  None    ED Discharge Orders    None       Lacretia Leigh, MD 12/20/17 1218

## 2017-12-20 NOTE — ED Notes (Signed)
Bed: WA21 Expected date:  Expected time:  Means of arrival:  Comments: EMS/shob 

## 2017-12-20 NOTE — ED Notes (Signed)
ED TO INPATIENT HANDOFF REPORT  Name/Age/Gender Adrian Andrews 67 y.o. male  Code Status Code Status History    Date Active Date Inactive Code Status Order ID Comments User Context   02/21/2017 1701 02/22/2017 1449 Full Code [id number]  Gabriel Earing, PA-C Inpatient      Home/SNF/Other Home  Chief Complaint Breathing problems  Level of Care/Admitting Diagnosis ED Disposition    ED Disposition Condition [facility] Area: [facility] [[id number]]  Level of Care: Telemetry [5]  Admit to tele based on following criteria: Acute CHF  Diagnosis: Heart failure [facility]) [[id number]]  Admitting Physician: Tawni Millers [[id number]]  Attending Physician: Tawni Millers [[id number]]  Estimated length of stay: 3 - 4 days  Certification:: I certify this patient will need inpatient services for at least 2 midnights  PT Class (Do Not Modify): Inpatient [101]  PT Acc Code (Do Not Modify): Private [1]       Medical History Past Medical History:  Diagnosis Date  . Anxiety   . Arthritis    back, right hip  . Chronic kidney disease    stage 3  . Diabetes mellitus without complication ([place])   . Dyspnea   . Heart murmur   . Hyperlipidemia   . Hypertension   . Neuropathy   . Peripheral vascular disease ([place])    Carotid artery stenosis, s/p bilat stents  . Skin cancer    basal cell right cheek, left ear  . Stroke [facility])    noted on MRI    Allergies Allergies  Allergen Reactions  . Clindamycin/Lincomycin     Throat tightens up  . Metformin And Related Other (See Comments)    Effected kidney function    IV Location/Drains/Wounds Patient Lines/Drains/Airways Status   Active Line/Drains/Airways    Name:   Placement date:   Placement time:   Site:   Days:   Peripheral IV 12/20/17 Left Antecubital   12/20/17    0950    Antecubital   less than 1   Incision (Closed) 02/21/17 Neck Left   02/21/17    1133     302           Labs/Imaging Results for orders placed or performed during the hospital encounter of 12/20/17 (from the past 48 hour(s))  CBC with Differential/Platelet     Status: Abnormal   Collection Time: 12/20/17 10:50 AM  Result Value Ref Range   WBC 7.7 4.0 - 10.5 K/uL   RBC 3.36 (L) 4.22 - 5.81 MIL/uL   Hemoglobin 11.3 (L) 13.0 - 17.0 g/dL   HCT 32.8 (L) 39.0 - 52.0 %   MCV 97.6 80.0 - 100.0 fL   MCH 33.6 26.0 - 34.0 pg   MCHC 34.5 30.0 - 36.0 g/dL    Comment: CORRECTED FOR COLD AGGLUTININS   RDW 14.5 11.5 - 15.5 %   Platelets 163 150 - 400 K/uL   nRBC 0.0 0.0 - 0.2 %   Neutrophils Relative % 87 %   Neutro Abs 6.7 1.7 - 7.7 K/uL   Lymphocytes Relative 7 %   Lymphs Abs 0.5 (L) 0.7 - 4.0 K/uL   Monocytes Relative 4 %   Monocytes Absolute 0.3 0.1 - 1.0 K/uL   Eosinophils Relative 1 %   Eosinophils Absolute 0.1 0.0 - 0.5 K/uL   Basophils Relative 0 %   Basophils Absolute 0.0 0.0 - 0.1 K/uL   Immature Granulocytes 1 %   Abs Immature Granulocytes 0.05  0.00 - 0.07 K/uL    Comment: Performed at [facility], [place] [address], [place], [place] [address]  Basic metabolic panel     Status: Abnormal   Collection Time: 12/20/17 10:50 AM  Result Value Ref Range   Sodium 132 (L) 135 - 145 mmol/L   Potassium 4.1 3.5 - 5.1 mmol/L   Chloride 99 98 - 111 mmol/L   CO2 24 22 - 32 mmol/L   Glucose, Bld 236 (H) 70 - 99 mg/dL   BUN 27 (H) 8 - 23 mg/dL   Creatinine, Ser 1.21 0.61 - 1.24 mg/dL   Calcium 8.9 8.9 - 10.3 mg/dL   GFR calc non Af Amer >60 >60 mL/min   GFR calc Af Amer >60 >60 mL/min   Anion gap 9 5 - 15    Comment: Performed at [facility], [place] [address] [place], [place] [address]  Urinalysis, Routine w reflex microscopic     Status: Abnormal   Collection Time: 12/20/17 12:50 PM  Result Value Ref Range   Color, Urine YELLOW YELLOW   APPearance CLEAR CLEAR   Specific Gravity, Urine 1.007 1.005 - 1.030   pH 6.0 5.0 - 8.0   Glucose, UA >=500 (A)  NEGATIVE mg/dL   Hgb urine dipstick SMALL (A) NEGATIVE   Bilirubin Urine NEGATIVE NEGATIVE   Ketones, ur NEGATIVE NEGATIVE mg/dL   Protein, ur 30 (A) NEGATIVE mg/dL   Nitrite NEGATIVE NEGATIVE   Leukocytes, UA NEGATIVE NEGATIVE   RBC / HPF 0-5 0 - 5 RBC/hpf   WBC, UA 0-5 0 - 5 WBC/hpf   Bacteria, UA NONE SEEN NONE SEEN   Mucus PRESENT     Comment: Performed at [facility], [place] [address], [place], [place] [address]  I-Stat CG4 Lactic Acid, ED     Status: Abnormal   Collection Time: 12/20/17 12:55 PM  Result Value Ref Range   Lactic Acid, Venous 2.78 (HH) 0.5 - 1.9 mmol/L   Comment NOTIFIED PHYSICIAN    Dg Chest Port 1 View  Result Date: 12/20/2017 CLINICAL DATA:  Increasing shortness of breath EXAM: PORTABLE CHEST 1 VIEW COMPARISON:  09/10/2013 FINDINGS: Diffuse bilateral mild interstitial thickening with patchy alveolar airspace opacities. No pleural effusion or pneumothorax. Stable cardiomediastinal silhouette. No acute osseous abnormality. Mild osteoarthritis of bilateral glenohumeral joints. IMPRESSION: Diffuse bilateral mild interstitial thickening and patchy alveolar airspace opacities. Differential considerations include mild pulmonary edema versus pneumonia. Electronically Signed   By: Kathreen Devoid   On: 12/20/2017 11:36   None  Pending Labs Unresulted Labs (From admission, onward)    Start     Ordered   12/20/17 1207  Culture, blood (Routine X 2) w Reflex to ID Panel  BLOOD CULTURE X 2,   R     12/20/17 1206   Signed and Held  HIV antibody (Routine Testing)  Once,   R     Signed and Held   Signed and Held  CBC  (enoxaparin (LOVENOX)    CrCl >/= 30 ml/min)  Once,   R    Comments:  Baseline for enoxaparin therapy IF NOT ALREADY DRAWN.  Notify MD if PLT < 100 K.    Signed and Held   Signed and Held  Creatinine, serum  (enoxaparin (LOVENOX)    CrCl >/= 30 ml/min)  Once,   R    Comments:  Baseline for enoxaparin therapy IF NOT ALREADY DRAWN.    Signed and  Held   Signed and  Held  Creatinine, serum  (enoxaparin (LOVENOX)    CrCl >/= 30 ml/min)  Weekly,   R    Comments:  while on enoxaparin therapy    Signed and Held   Signed and Held  Basic metabolic panel  Tomorrow morning,   R     Signed and Held   Signed and Held  CBC  Tomorrow morning,   R     Signed and Held          Vitals/Pain Today's Vitals   12/20/17 1033 12/20/17 1041 12/20/17 1341 12/20/17 1400  BP:   (!) 106/56 102/61  Pulse:   (!) 105 (!) 107  Resp:   14 20  Temp:      SpO2:   94% 94%  PainSc: 0-No pain 0-No pain      Isolation Precautions No active isolations  Medications Medications  0.9 %  sodium chloride infusion ( Intravenous New Bag/Given 12/20/17 1139)  cefTRIAXone (ROCEPHIN) 2 g in sodium chloride 0.9 % 100 mL IVPB (2 g Intravenous New Bag/Given 12/20/17 1341)  azithromycin (ZITHROMAX) 500 mg in sodium chloride 0.9 % 250 mL IVPB (has no administration in time range)  albuterol (PROVENTIL,VENTOLIN) solution continuous neb (10 mg/hr Nebulization Given 12/20/17 1149)  LORazepam (ATIVAN) injection 0.5 mg (0.5 mg Intravenous Given 12/20/17 1139)    Mobility walks

## 2017-12-20 NOTE — ED Notes (Signed)
Dr. Dorena Dew is aware of lactic acid of 5.92

## 2017-12-20 NOTE — ED Triage Notes (Signed)
He phoned EMS d/t 2-3 day hx of increasing shortness of breath. EMS found pt. To be wheezing and administered Duoneb tx and IV Solu Medrol. Pt. Arrives to E.D. Feeling and breathing "much better".

## 2017-12-20 NOTE — ED Notes (Signed)
RN made aware of patient elevated lactic and will notify hospitalist.

## 2017-12-20 NOTE — ED Notes (Signed)
Dr. Dorena Dew made aware of lactic acid of 2.78.

## 2017-12-21 ENCOUNTER — Inpatient Hospital Stay (HOSPITAL_COMMUNITY): Payer: Medicare Other

## 2017-12-21 ENCOUNTER — Encounter (HOSPITAL_COMMUNITY): Payer: Self-pay | Admitting: *Deleted

## 2017-12-21 DIAGNOSIS — N179 Acute kidney failure, unspecified: Secondary | ICD-10-CM

## 2017-12-21 DIAGNOSIS — R0602 Shortness of breath: Secondary | ICD-10-CM

## 2017-12-21 DIAGNOSIS — J9601 Acute respiratory failure with hypoxia: Secondary | ICD-10-CM

## 2017-12-21 DIAGNOSIS — J189 Pneumonia, unspecified organism: Secondary | ICD-10-CM

## 2017-12-21 DIAGNOSIS — J9621 Acute and chronic respiratory failure with hypoxia: Secondary | ICD-10-CM

## 2017-12-21 LAB — BASIC METABOLIC PANEL
Anion gap: 10 (ref 5–15)
BUN: 36 mg/dL — ABNORMAL HIGH (ref 8–23)
CO2: 25 mmol/L (ref 22–32)
Calcium: 9.1 mg/dL (ref 8.9–10.3)
Chloride: 100 mmol/L (ref 98–111)
Creatinine, Ser: 1.32 mg/dL — ABNORMAL HIGH (ref 0.61–1.24)
GFR calc Af Amer: >60 mL/min (ref >60)
GFR calc non Af Amer: 55 mL/min — ABNORMAL LOW (ref >60)
Glucose, Bld: 231 mg/dL — ABNORMAL HIGH (ref 70–99)
POTASSIUM: 4.6 mmol/L (ref 3.5–5.1)
Sodium: 135 mmol/L (ref 135–145)

## 2017-12-21 LAB — CBC
HEMATOCRIT: 30.8 % — AB (ref 39.0–52.0)
HEMOGLOBIN: 10 g/dL — AB (ref 13.0–17.0)
MCH: 31.9 pg (ref 26.0–34.0)
MCHC: 32.5 g/dL (ref 30.0–36.0)
MCV: 98.4 fL (ref 80.0–100.0)
Platelets: 139 10*3/uL — ABNORMAL LOW (ref 150–400)
RBC: 3.13 MIL/uL — ABNORMAL LOW (ref 4.22–5.81)
RDW: 14.5 % (ref 11.5–15.5)
WBC: 9.7 10*3/uL (ref 4.0–10.5)
nRBC: 0 % (ref 0.0–0.2)

## 2017-12-21 LAB — ECHOCARDIOGRAM COMPLETE
Height: 68 in
Weight: 2288 oz

## 2017-12-21 LAB — GLUCOSE, CAPILLARY
GLUCOSE-CAPILLARY: 192 mg/dL — AB (ref 70–99)
Glucose-Capillary: 207 mg/dL — ABNORMAL HIGH (ref 70–99)
Glucose-Capillary: 198 mg/dL — ABNORMAL HIGH (ref 70–99)
Glucose-Capillary: 270 mg/dL — ABNORMAL HIGH (ref 70–99)

## 2017-12-21 LAB — HIV ANTIBODY (ROUTINE TESTING W REFLEX): HIV Screen 4th Generation wRfx: NONREACTIVE

## 2017-12-21 LAB — LACTIC ACID, PLASMA: Lactic Acid, Venous: 1 mmol/L (ref 0.5–1.9)

## 2017-12-21 MED ORDER — PNEUMOCOCCAL VAC POLYVALENT 25 MCG/0.5ML IJ INJ: 0.5000 mL | INJECTION | INTRAMUSCULAR | Status: DC

## 2017-12-21 MED ORDER — LORAZEPAM 0.5 MG PO TABS
0.5000 mg | ORAL_TABLET | Freq: Four times a day (QID) | ORAL | Status: DC | PRN
  Administered 2017-12-21 – 2017-12-23 (×4): 0.5 mg via ORAL
  Filled 2017-12-21 (×4): qty 1

## 2017-12-21 MED ORDER — TIOTROPIUM BROMIDE MONOHYDRATE 18 MCG IN CAPS: 18.0000 ug | ORAL_CAPSULE | Freq: Every day | RESPIRATORY_TRACT | Status: DC

## 2017-12-21 MED ORDER — SODIUM CHLORIDE 0.9 % IV BOLUS
1000.0000 mL | Freq: Once | INTRAVENOUS | Status: AC
  Administered 2017-12-21: 1000 mL via INTRAVENOUS

## 2017-12-21 MED ORDER — INSULIN ASPART 100 UNIT/ML ~~LOC~~ SOLN
0.0000 [IU] | Freq: Three times a day (TID) | SUBCUTANEOUS | Status: DC
  Administered 2017-12-21: 8 [IU] via SUBCUTANEOUS
  Administered 2017-12-21: 3 [IU] via SUBCUTANEOUS
  Administered 2017-12-22: 8 [IU] via SUBCUTANEOUS
  Administered 2017-12-22: 11 [IU] via SUBCUTANEOUS
  Administered 2017-12-23 (×2): 3 [IU] via SUBCUTANEOUS

## 2017-12-21 MED ORDER — INSULIN ASPART 100 UNIT/ML ~~LOC~~ SOLN
3.0000 [IU] | Freq: Three times a day (TID) | SUBCUTANEOUS | Status: DC
  Administered 2017-12-21 – 2017-12-23 (×6): 3 [IU] via SUBCUTANEOUS

## 2017-12-21 MED ORDER — METHYLPREDNISOLONE SODIUM SUCC 40 MG IJ SOLR
40.0000 mg | Freq: Two times a day (BID) | INTRAMUSCULAR | Status: DC
  Administered 2017-12-21 (×2): 40 mg via INTRAVENOUS
  Filled 2017-12-21 (×2): qty 1

## 2017-12-21 MED ORDER — UMECLIDINIUM BROMIDE 62.5 MCG/INH IN AEPB
1.0000 | INHALATION_SPRAY | Freq: Every day | RESPIRATORY_TRACT | Status: DC
  Administered 2017-12-22 – 2017-12-23 (×2): 1 via RESPIRATORY_TRACT
  Filled 2017-12-21: qty 7

## 2017-12-21 MED ORDER — INSULIN ASPART 100 UNIT/ML ~~LOC~~ SOLN: 0.0000 [IU] | Freq: Every day | SUBCUTANEOUS | Status: DC

## 2017-12-21 MED ORDER — INFLUENZA VAC SPLIT HIGH-DOSE 0.5 ML IM SUSY: 0.5000 mL | PREFILLED_SYRINGE | INTRAMUSCULAR | Status: DC

## 2017-12-21 MED ORDER — NICOTINE 21 MG/24HR TD PT24
21.0000 mg | MEDICATED_PATCH | Freq: Every day | TRANSDERMAL | Status: DC
  Administered 2017-12-21 – 2017-12-23 (×3): 21 mg via TRANSDERMAL
  Filled 2017-12-21 (×3): qty 1

## 2017-12-21 NOTE — Progress Notes (Signed)
[facility] PROGRESS NOTE    Progress Note  Adrian Andrews  ZJI:[id number] DOB: 1950/12/17 DOA: 12/20/2017 PCP: [name], MD     Brief Narrative:   Adrian Andrews is an 67 y.o. male  with medical history significant of history of type 2 diabetes mellitus, hypertension, chronic kidney disease stage III, anxiety and tobacco abuse.  Patient presents with a 3 days history of worsening dyspnea, moderate to severe intensity, to the point where he is dyspneic with minimal efforts  Assessment/Plan:    Acute on chronic respiratory failure with hypoxia due to   COPD exacerbation in the setting of   CAP (community acquired pneumonia): Started empirically on IV Rocephin and azithromycin.  He relates his breathing is unchanged. We will start him on IV steroids and inhalers Start him on Spiriva. Unlikely to be heart failure exacerbation. Discontinue Lasix given a liter of normal saline he has a mild rise in his creatinine. He is not septic his lactic acidosis was due to his hypoxia  AKI (acute kidney injury) ([place]) Likely due to overdiuresis.  Discontinue IV Lasix start aggressive IV fluid hydration recheck a basic metabolic panel in the morning.  Essential hypertension: Continue atenolol.    Diabetes mellitus type 2: Agree with holding oral hypoglycemic agents. Continue sliding scale insulin.  Dyslipidemia: Continue Lipitor.  Depression: Continue sertraline     DVT prophylaxis: lovenxo Family Communication:none Disposition Plan/Barrier to D/C: once breathing is improved Code Status:     Code Status Orders  (From admission, onward)         Start     Ordered   12/20/17 1609  Full code  Continuous     12/20/17 1608        Code Status History    Date Active Date Inactive Code Status Order ID Comments User Context   02/21/2017 1701 02/22/2017 1449 Full Code [id number]  Gabriel Earing, PA-C Inpatient        IV Access:    Peripheral IV   Procedures and  diagnostic studies:   Dg Chest Port 1 View  Result Date: 12/20/2017 CLINICAL DATA:  Increasing shortness of breath EXAM: PORTABLE CHEST 1 VIEW COMPARISON:  09/10/2013 FINDINGS: Diffuse bilateral mild interstitial thickening with patchy alveolar airspace opacities. No pleural effusion or pneumothorax. Stable cardiomediastinal silhouette. No acute osseous abnormality. Mild osteoarthritis of bilateral glenohumeral joints. IMPRESSION: Diffuse bilateral mild interstitial thickening and patchy alveolar airspace opacities. Differential considerations include mild pulmonary edema versus pneumonia. Electronically Signed   By: Kathreen Devoid   On: 12/20/2017 11:36     Medical Consultants:    None.  Anti-Infectives:   Rocephin and azithromycin  Subjective:    Adrian Andrews is his breathing is not improved compared to yesterday.  He is very anxious.  Objective:    Vitals:   12/20/17 2129 12/21/17 0449 12/21/17 0450 12/21/17 0758  BP: (!) 114/51 110/67    Pulse: (!) 105 79    Resp: 18 18    Temp: 98 F (36.7 C) 97.9 F (36.6 C)    TempSrc: Oral Oral    SpO2: 97% 96%  96%  Weight:   64.9 kg   Height:        Intake/Output Summary (Last 24 hours) at 12/21/2017 0815 Last data filed at 12/21/2017 0600 Gross per 24 hour  Intake 1380 ml  Output 1950 ml  Net -570 ml   Filed Weights   12/20/17 1618 12/21/17 0450  Weight: 66.6 kg 64.9 kg  Exam: General exam: In no acute distress. Respiratory system: Good air movement and wheezing bilaterally. Cardiovascular system: S1 & S2 heard, RRR. No JVD. Gastrointestinal system: Abdomen is nondistended, soft and nontender.  Central nervous system: Alert and oriented. No focal neurological deficits. Extremities: No pedal edema. Skin: No rashes, lesions or ulcers Psychiatry: Judgement and insight appear normal. Mood & affect appropriate.    Data Reviewed:    Labs: Basic Metabolic Panel: Recent Labs  Lab 12/20/17 1050 12/21/17 0502    NA 132* 135  K 4.1 4.6  CL 99 100  CO2 24 25  GLUCOSE 236* 231*  BUN 27* 36*  CREATININE 1.21 1.32*  CALCIUM 8.9 9.1   GFR Estimated Creatinine Clearance: 48.5 mL/min (A) (by C-G formula based on SCr of 1.32 mg/dL (H)). Liver Function Tests: No results for input(s): AST, ALT, ALKPHOS, BILITOT, PROT, ALBUMIN in the last 168 hours. No results for input(s): LIPASE, AMYLASE in the last 168 hours. No results for input(s): AMMONIA in the last 168 hours. Coagulation profile No results for input(s): INR, PROTIME in the last 168 hours.  CBC: Recent Labs  Lab 12/20/17 1050  WBC 7.7  NEUTROABS 6.7  HGB 11.3*  HCT 32.8*  MCV 97.6  PLT 163   Cardiac Enzymes: No results for input(s): CKTOTAL, CKMB, CKMBINDEX, TROPONINI in the last 168 hours. BNP (last 3 results) No results for input(s): PROBNP in the last 8760 hours. CBG: Recent Labs  Lab 12/20/17 1628 12/20/17 2124 12/21/17 0728  GLUCAP 342* 285* 207*   D-Dimer: No results for input(s): DDIMER in the last 72 hours. Hgb A1c: No results for input(s): HGBA1C in the last 72 hours. Lipid Profile: No results for input(s): CHOL, HDL, LDLCALC, TRIG, CHOLHDL, LDLDIRECT in the last 72 hours. Thyroid function studies: No results for input(s): TSH, T4TOTAL, T3FREE, THYROIDAB in the last 72 hours.  Invalid input(s): FREET3 Anemia work up: No results for input(s): VITAMINB12, FOLATE, FERRITIN, TIBC, IRON, RETICCTPCT in the last 72 hours. Sepsis Labs: Recent Labs  Lab 12/20/17 1050 12/20/17 1255 12/20/17 1509 12/21/17 0502  WBC 7.7  --   --   --   LATICACIDVEN  --  2.78* 5.92* 1.0   Microbiology No results found for this or any previous visit (from the past 240 hour(s)).   Medications:   . aspirin EC  81 mg Oral Daily  . atenolol  50 mg Oral BID  . atorvastatin  20 mg Oral QHS  . azithromycin  500 mg Oral Daily  . enoxaparin (LOVENOX) injection  40 mg Subcutaneous q1800  . gabapentin  300 mg Oral QHS  . insulin aspart   0-15 Units Subcutaneous TID WC  . insulin aspart  0-5 Units Subcutaneous QHS  . insulin aspart  3 Units Subcutaneous TID WC  . ipratropium-albuterol  3 mL Nebulization TID  . methylPREDNISolone (SOLU-MEDROL) injection  40 mg Intravenous Q12H  . sertraline  50 mg Oral Daily   Continuous Infusions: . cefTRIAXone (ROCEPHIN)  IV    . sodium chloride       LOS: 1 day   Charlynne Cousins  [facility]   *Please refer to [email or web address], password [email or web address] to get updated schedule on who will round on this patient, as hospitalists switch teams weekly. If 7PM-7AM, please contact night-coverage at [email or web address], password [email or web address] for any overnight needs.  12/21/2017, 8:15 AM

## 2017-12-21 NOTE — Progress Notes (Signed)
  Echocardiogram 2D Echocardiogram has been performed.  Jannett Celestine 12/21/2017, 10:58 AM

## 2017-12-22 LAB — BASIC METABOLIC PANEL
Anion gap: 10 (ref 5–15)
BUN: 42 mg/dL — ABNORMAL HIGH (ref 8–23)
CALCIUM: 9.3 mg/dL (ref 8.9–10.3)
CO2: 24 mmol/L (ref 22–32)
Chloride: 100 mmol/L (ref 98–111)
Creatinine, Ser: 1.37 mg/dL — ABNORMAL HIGH (ref 0.61–1.24)
GFR calc Af Amer: >60 mL/min (ref >60)
GFR calc non Af Amer: 52 mL/min — ABNORMAL LOW (ref >60)
Glucose, Bld: 283 mg/dL — ABNORMAL HIGH (ref 70–99)
Potassium: 5.5 mmol/L — ABNORMAL HIGH (ref 3.5–5.1)
Sodium: 134 mmol/L — ABNORMAL LOW (ref 135–145)

## 2017-12-22 LAB — GLUCOSE, CAPILLARY
GLUCOSE-CAPILLARY: 103 mg/dL — AB (ref 70–99)
Glucose-Capillary: 257 mg/dL — ABNORMAL HIGH (ref 70–99)
Glucose-Capillary: 321 mg/dL — ABNORMAL HIGH (ref 70–99)
Glucose-Capillary: 152 mg/dL — ABNORMAL HIGH (ref 70–99)

## 2017-12-22 MED ORDER — SODIUM CHLORIDE 0.9 % IV BOLUS
1000.0000 mL | Freq: Once | INTRAVENOUS | Status: AC
  Administered 2017-12-22: 1000 mL via INTRAVENOUS

## 2017-12-22 MED ORDER — ALBUTEROL SULFATE (2.5 MG/3ML) 0.083% IN NEBU
2.5000 mg | INHALATION_SOLUTION | RESPIRATORY_TRACT | Status: DC
  Administered 2017-12-22: 2.5 mg via RESPIRATORY_TRACT
  Filled 2017-12-22: qty 3

## 2017-12-22 MED ORDER — SODIUM CHLORIDE 0.9 % IV SOLN
INTRAVENOUS | Status: AC
  Administered 2017-12-22 – 2017-12-23 (×3): via INTRAVENOUS

## 2017-12-22 MED ORDER — SODIUM POLYSTYRENE SULFONATE 15 GM/60ML PO SUSP
30.0000 g | Freq: Once | ORAL | Status: AC
  Administered 2017-12-22: 30 g via ORAL
  Filled 2017-12-22: qty 120

## 2017-12-22 MED ORDER — ALBUTEROL SULFATE (2.5 MG/3ML) 0.083% IN NEBU
2.5000 mg | INHALATION_SOLUTION | Freq: Four times a day (QID) | RESPIRATORY_TRACT | Status: DC
  Administered 2017-12-22 – 2017-12-23 (×4): 2.5 mg via RESPIRATORY_TRACT
  Filled 2017-12-22 (×4): qty 3

## 2017-12-22 MED ORDER — MENTHOL 3 MG MT LOZG
1.0000 | LOZENGE | OROMUCOSAL | Status: DC | PRN
  Filled 2017-12-22: qty 9

## 2017-12-22 MED ORDER — ALBUTEROL SULFATE HFA 108 (90 BASE) MCG/ACT IN AERS: 2.0000 | INHALATION_SPRAY | RESPIRATORY_TRACT | Status: DC | PRN

## 2017-12-22 MED ORDER — ALBUTEROL SULFATE (2.5 MG/3ML) 0.083% IN NEBU: 2.5000 mg | INHALATION_SOLUTION | RESPIRATORY_TRACT | Status: DC | PRN

## 2017-12-22 MED ORDER — PREDNISONE 20 MG PO TABS
40.0000 mg | ORAL_TABLET | Freq: Every day | ORAL | Status: DC
  Administered 2017-12-22 – 2017-12-23 (×2): 40 mg via ORAL
  Filled 2017-12-22 (×2): qty 2

## 2017-12-22 NOTE — Progress Notes (Signed)
[facility] PROGRESS NOTE    Progress Note  Adrian Andrews  BPZ:[id number] DOB: 1950-08-17 DOA: 12/20/2017 PCP: [name], MD     Brief Narrative:   Adrian Andrews is an 67 y.o. male  with medical history significant of history of type 2 diabetes mellitus, hypertension, chronic kidney disease stage III, anxiety and tobacco abuse.  Patient presents with a 3 days history of worsening dyspnea, moderate to severe intensity, to the point where he is dyspneic with minimal efforts  Assessment/Plan:    Acute on chronic respiratory failure with hypoxia due to   COPD exacerbation in the setting of CAP (community acquired pneumonia): Started empirically on IV Rocephin and azithromycin.  He relates his breathing is unchanged. Wheezing is improved transition steroids to oral continue inhalers.,  Start him on Spiriva discontinue DuoNeb start him on albuterol.  AKI (acute kidney injury) ([place]) Likely due to overdiuresis, Lasix was discontinued we will give him a bolus of normal saline and continue IV fluid hydration recheck in the morning.  Hyperkalemia: Likely due to hypovolemia we will hydrate him aggressively. Give him oral Kayexalate and recheck a basic metabolic panel in the morning.  Essential hypertension: Continue atenolol.    Diabetes mellitus type 2: Agree with holding oral hypoglycemic agents. Continue sliding scale insulin.  Dyslipidemia: Continue Lipitor.  Depression: Continue sertraline    DVT prophylaxis: lovenxo Family Communication:none Disposition Plan/Barrier to D/C: once breathing is improved Code Status:     Code Status Orders  (From admission, onward)         Start     Ordered   12/20/17 1609  Full code  Continuous     12/20/17 1608        Code Status History    Date Active Date Inactive Code Status Order ID Comments User Context   02/21/2017 1701 02/22/2017 1449 Full Code [id number]  Gabriel Earing, PA-C Inpatient        IV Access:     Peripheral IV   Procedures and diagnostic studies:   Dg Chest Port 1 View  Result Date: 12/20/2017 CLINICAL DATA:  Increasing shortness of breath EXAM: PORTABLE CHEST 1 VIEW COMPARISON:  09/10/2013 FINDINGS: Diffuse bilateral mild interstitial thickening with patchy alveolar airspace opacities. No pleural effusion or pneumothorax. Stable cardiomediastinal silhouette. No acute osseous abnormality. Mild osteoarthritis of bilateral glenohumeral joints. IMPRESSION: Diffuse bilateral mild interstitial thickening and patchy alveolar airspace opacities. Differential considerations include mild pulmonary edema versus pneumonia. Electronically Signed   By: Kathreen Devoid   On: 12/20/2017 11:36     Medical Consultants:    None.  Anti-Infectives:   Rocephin and azithromycin  Subjective:    Adrian Andrews relates his breathing is not improved compared to yesterday.  He is very anxious.  Objective:    Vitals:   12/21/17 1335 12/21/17 1936 12/21/17 2048 12/22/17 0523  BP: 91/61 96/68  134/78  Pulse: 82 78  77  Resp: 16 20  18   Temp: (!) 97.5 F (36.4 C) (!) 97.5 F (36.4 C)  (!) 97.4 F (36.3 C)  TempSrc: Oral Oral  Oral  SpO2: 91% 96% 91% 94%  Weight:      Height:        Intake/Output Summary (Last 24 hours) at 12/22/2017 0739 Last data filed at 12/22/2017 0200 Gross per 24 hour  Intake 480 ml  Output -  Net 480 ml   Filed Weights   12/20/17 1618 12/21/17 0450  Weight: 66.6 kg 64.9 kg  Exam: General exam: In no acute distress. Respiratory system: Good air movement and wheezing bilaterally. Cardiovascular system: S1 & S2 heard, RRR. No JVD. Gastrointestinal system: Abdomen is nondistended, soft and nontender.  Central nervous system: Alert and oriented. No focal neurological deficits. Extremities: No pedal edema. Skin: No rashes, lesions or ulcers Psychiatry: Judgement and insight appear normal. Mood & affect appropriate.    Data Reviewed:    Labs: Basic  Metabolic Panel: Recent Labs  Lab 12/20/17 1050 12/21/17 0502 12/22/17 0431  NA 132* 135 134*  K 4.1 4.6 5.5*  CL 99 100 100  CO2 24 25 24   GLUCOSE 236* 231* 283*  BUN 27* 36* 42*  CREATININE 1.21 1.32* 1.37*  CALCIUM 8.9 9.1 9.3   GFR Estimated Creatinine Clearance: 46.7 mL/min (A) (by C-G formula based on SCr of 1.37 mg/dL (H)). Liver Function Tests: No results for input(s): AST, ALT, ALKPHOS, BILITOT, PROT, ALBUMIN in the last 168 hours. No results for input(s): LIPASE, AMYLASE in the last 168 hours. No results for input(s): AMMONIA in the last 168 hours. Coagulation profile No results for input(s): INR, PROTIME in the last 168 hours.  CBC: Recent Labs  Lab 12/20/17 1050 12/21/17 0502  WBC 7.7 9.7  NEUTROABS 6.7  --   HGB 11.3* 10.0*  HCT 32.8* 30.8*  MCV 97.6 98.4  PLT 163 139*   Cardiac Enzymes: No results for input(s): CKTOTAL, CKMB, CKMBINDEX, TROPONINI in the last 168 hours. BNP (last 3 results) No results for input(s): PROBNP in the last 8760 hours. CBG: Recent Labs  Lab 12/21/17 0728 12/21/17 1146 12/21/17 1726 12/21/17 2051 12/22/17 0731  GLUCAP 207* 198* 270* 192* 257*   D-Dimer: No results for input(s): DDIMER in the last 72 hours. Hgb A1c: No results for input(s): HGBA1C in the last 72 hours. Lipid Profile: No results for input(s): CHOL, HDL, LDLCALC, TRIG, CHOLHDL, LDLDIRECT in the last 72 hours. Thyroid function studies: No results for input(s): TSH, T4TOTAL, T3FREE, THYROIDAB in the last 72 hours.  Invalid input(s): FREET3 Anemia work up: No results for input(s): VITAMINB12, FOLATE, FERRITIN, TIBC, IRON, RETICCTPCT in the last 72 hours. Sepsis Labs: Recent Labs  Lab 12/20/17 1050 12/20/17 1255 12/20/17 1509 12/21/17 0502  WBC 7.7  --   --  9.7  LATICACIDVEN  --  2.78* 5.92* 1.0   Microbiology Recent Results (from the past 240 hour(s))  Culture, blood (Routine X 2) w Reflex to ID Panel     Status: None (Preliminary result)    Collection Time: 12/20/17 12:12 PM  Result Value Ref Range Status   Specimen Description   Final    LEFT ANTECUBITAL Performed at [facility] [address] [place], [place] [address]    Special Requests   Final    BOTTLES DRAWN AEROBIC AND ANAEROBIC Blood Culture adequate volume Performed at [facility] [address] [place], [place] [address]    Culture   Final    NO GROWTH < 24 HOURS Performed at [facility] [address] [place], [place] [address]    Report Status PENDING  Incomplete  Culture, blood (Routine X 2) w Reflex to ID Panel     Status: None (Preliminary result)   Collection Time: 12/20/17 12:50 PM  Result Value Ref Range Status   Specimen Description   Final    RIGHT ANTECUBITAL Performed at [facility] [address] [place],  [address]    Special Requests   Final  BOTTLES DRAWN AEROBIC AND ANAEROBIC Blood Culture adequate volume Performed at [facility] [address] [place], [place] [address]    Culture   Final    NO GROWTH < 24 HOURS Performed at [facility] [address] [place], [place] [address]    Report Status PENDING  Incomplete     Medications:   . aspirin EC  81 mg Oral Daily  . atenolol  50 mg Oral BID  . atorvastatin  20 mg Oral QHS  . azithromycin  500 mg Oral Daily  . enoxaparin (LOVENOX) injection  40 mg Subcutaneous q1800  . gabapentin  300 mg Oral QHS  . Influenza vac split quadrivalent PF  0.5 mL Intramuscular Tomorrow-1000  . insulin aspart  0-15 Units Subcutaneous TID WC  . insulin aspart  0-5 Units Subcutaneous QHS  . insulin aspart  3 Units Subcutaneous TID WC  . ipratropium-albuterol  3 mL Nebulization TID  . methylPREDNISolone (SOLU-MEDROL) injection  40 mg Intravenous Q12H  . nicotine  21 mg Transdermal Daily  . pneumococcal 23 valent vaccine  0.5 mL Intramuscular Tomorrow-1000  . sertraline  50 mg Oral Daily    . umeclidinium bromide  1 puff Inhalation Daily   Continuous Infusions: . cefTRIAXone (ROCEPHIN)  IV Stopped (12/21/17 1228)     LOS: 2 days   Charlynne Cousins  [facility]   *Please refer to [email or web address], password [email or web address] to get updated schedule on who will round on this patient, as hospitalists switch teams weekly. If 7PM-7AM, please contact night-coverage at [email or web address], password [email or web address] for any overnight needs.  12/22/2017, 7:39 AM

## 2017-12-23 ENCOUNTER — Encounter (HOSPITAL_COMMUNITY): Payer: Self-pay

## 2017-12-23 LAB — BASIC METABOLIC PANEL
Anion gap: 9 (ref 5–15)
BUN: 32 mg/dL — ABNORMAL HIGH (ref 8–23)
CO2: 25 mmol/L (ref 22–32)
Calcium: 8.6 mg/dL — ABNORMAL LOW (ref 8.9–10.3)
Chloride: 102 mmol/L (ref 98–111)
Creatinine, Ser: 1.2 mg/dL (ref 0.61–1.24)
GFR calc Af Amer: >60 mL/min (ref >60)
GFR calc non Af Amer: >60 mL/min (ref >60)
Glucose, Bld: 183 mg/dL — ABNORMAL HIGH (ref 70–99)
Potassium: 4.4 mmol/L (ref 3.5–5.1)
Sodium: 136 mmol/L (ref 135–145)

## 2017-12-23 LAB — GLUCOSE, CAPILLARY
Glucose-Capillary: 153 mg/dL — ABNORMAL HIGH (ref 70–99)
Glucose-Capillary: 177 mg/dL — ABNORMAL HIGH (ref 70–99)

## 2017-12-23 MED ORDER — PREDNISONE 10 MG PO TABS: ORAL_TABLET | ORAL | 0 refills | Status: DC

## 2017-12-23 MED ORDER — AZITHROMYCIN 250 MG PO TABS: ORAL_TABLET | ORAL | 0 refills | Status: DC

## 2017-12-23 MED ORDER — ALBUTEROL SULFATE (2.5 MG/3ML) 0.083% IN NEBU: 2.5000 mg | INHALATION_SOLUTION | Freq: Three times a day (TID) | RESPIRATORY_TRACT | Status: DC

## 2017-12-23 NOTE — Care Management Important Message (Signed)
Important Message  Patient Details  Name: GEHRIG PATRAS MRN: [id number] Date of Birth: 11-14-1950   Medicare Important Message Given:  Yes    Kerin Salen 12/23/2017, 12:27 [place] Message  Patient Details  Name: KAYAN BLISSETT MRN: [id number] Date of Birth: 1950/04/18   Medicare Important Message Given:  Yes    Kerin Salen 12/23/2017, 12:27 PM

## 2017-12-23 NOTE — Care Management Note (Signed)
Case Management Note  Patient Details  Name: Adrian Andrews MRN: [id number] Date of Birth: [birth year]  Subjective/Objective:  COPD, Heart failure, Acute on Chronic respiratory failure with hypoxia.                   Action/Plan: Pt states that he use his sister's Oxygen from [employer]. Pt states that his sister passed and now he has her Oxygen.  Explained to pt that he need his on oxygen at home. A call to toll free number to confirm was unsuccessful. Spoke with pt's HCPOA who states the pt will need O2 at home,  HCPOA selected [facility], oxygen was ordered.  Please call [facility] [phone] for follow up with O2 delivery.    Expected Discharge Date:  12/23/17               Expected Discharge Plan:  Home/Self Care  In-House Referral:     Discharge planning Services  CM Consult  Post Acute Care Choice:    Choice offered to:  Patient, [facility] POA / Guardian  DME Arranged:  Oxygen DME Agency:  [place]:    [place] Agency:     Status of Service:  Completed, signed off  If discussed at [place] of Stay Meetings, dates discussed:    Additional CommentsPurcell Mouton, RN 12/23/2017, 12:33 PM

## 2017-12-23 NOTE — Discharge Summary (Signed)
Physician Discharge Summary  Adrian Andrews JKD:[id number] DOB: 06-Aug-1950 DOA: 12/20/2017  PCP: [name], MD  Admit date: 12/20/2017 Discharge date: 12/23/2017  Admitted From: home Disposition:  Home  Recommendations for Outpatient Follow-up:  1. Follow up with PCP in 1-2 weeks   Home Health:No Equipment/Devices:none  Discharge Condition:stable CODE STATUS:full Diet recommendation: Heart Healthy   Brief/Interim Summary: 67 y.o. male with medical history significant ofhistory of type 2 diabetes mellitus, hypertension, chronic kidney disease stage III, anxiety and tobacco abuse. Patient presents with a 3 days history of worsening dyspnea, moderate to severe intensity, to the point where he is dyspneic with minimal efforts  Discharge Diagnoses:  Active Problems:   COPD exacerbation ([place])   Heart failure ([place])   Acute on chronic respiratory failure with hypoxia ([facility])   CAP (community acquired pneumonia)   AKI (acute kidney injury) ([place]) Acute respiratory failure with hypoxia due to COPD exacerbation in the setting of community-acquired pneumonia: Empirically on IV Rocephin and azithromycin and IV steroids with inhalers. Started on Spiriva. Once his respiration was improved he was changed to oral antibiotics and steroids which she will continue as an outpatient. He will continue steroid taper for 2 weeks as an outpatient. He was ambulated down the hall and his saturations dropped so he would go home on oxygen for 2 weeks.  Acute kidney injury: Likely due to overdiuresis this resolved with IV fluid hydration.  Hyperkalemia: Likely due to hypovolemia resolved with IV fluid hydration and oral Kayexalate.  Essential hypertension: Continue current medications no changes made.  Diabetes mellitus type 2: No changes made to his oral hypoglycemic agents.  Dyslipidemia: Continue Lipitor.   Discharge Instructions  Discharge Instructions    Diet - low sodium heart  healthy   Complete by:  As directed    Increase activity slowly   Complete by:  As directed      Allergies as of 12/23/2017      Reactions   Clindamycin/lincomycin    Throat tightens up   Metformin And Related Other (See Comments)   Effected kidney function      Medication List    TAKE these medications   aspirin EC 81 MG tablet Take 81 mg by mouth daily.   atenolol 50 MG tablet Commonly known as:  TENORMIN Take 50 mg by mouth 2 (two) times daily.   atorvastatin 40 MG tablet Commonly known as:  LIPITOR Take 20 mg by mouth at bedtime.   azithromycin 250 MG tablet Commonly known as:  ZITHROMAX Take one tab daily   gabapentin 300 MG capsule Commonly known as:  NEURONTIN Take 300 mg by mouth at bedtime.   glipiZIDE 10 MG tablet Commonly known as:  GLUCOTROL Take 10 mg by mouth 2 (two) times daily.   predniSONE 10 MG tablet Commonly known as:  DELTASONE Takes 6 tablets for 2 days, then 5 tablets for 2 days, then 4 tablets for 2 days, then 3 tablets for 2 days, then 2 tabs for 2 days, then 1 tab for 2 days, and then stop.   sertraline 50 MG tablet Commonly known as:  ZOLOFT Take 50 mg by mouth daily.   traMADol 50 MG tablet Commonly known as:  ULTRAM Take 1 tablet (50 mg total) by mouth 3 (three) times daily as needed for moderate pain (arthritis).            Durable Medical Equipment  (From admission, onward)         Start  Ordered   12/23/17 0846  For home use only DME oxygen  Once    Question Answer Comment  Mode or (Route) Nasal cannula   Liters per Minute 2   Frequency Continuous (stationary and portable oxygen unit needed)   Oxygen delivery system Gas      12/23/17 0845          Allergies  Allergen Reactions  . Clindamycin/Lincomycin     Throat tightens up  . Metformin And Related Other (See Comments)    Effected kidney function    Consultations:  None   Procedures/Studies: Dg Chest Port 1 View  Result Date:  12/20/2017 CLINICAL DATA:  Increasing shortness of breath EXAM: PORTABLE CHEST 1 VIEW COMPARISON:  09/10/2013 FINDINGS: Diffuse bilateral mild interstitial thickening with patchy alveolar airspace opacities. No pleural effusion or pneumothorax. Stable cardiomediastinal silhouette. No acute osseous abnormality. Mild osteoarthritis of bilateral glenohumeral joints. IMPRESSION: Diffuse bilateral mild interstitial thickening and patchy alveolar airspace opacities. Differential considerations include mild pulmonary edema versus pneumonia. Electronically Signed   By: Kathreen Devoid   On: 12/20/2017 11:36     Subjective: No complaints feels great.  Discharge Exam: Vitals:   12/23/17 0124 12/23/17 0441  BP:  99/67  Pulse:  67  Resp:  20  Temp:  97.7 F (36.5 C)  SpO2: 99% 98%   Vitals:   12/22/17 2111 12/23/17 0120 12/23/17 0124 12/23/17 0441  BP: 112/76   99/67  Pulse: 84   67  Resp: 18   20  Temp: 98.2 F (36.8 C)   97.7 F (36.5 C)  TempSrc: Oral   Oral  SpO2: 96% (!) 85% 99% 98%  Weight:    67.4 kg  Height:        General: Pt is alert, awake, not in acute distress Cardiovascular: RRR, S1/S2 +, no rubs, no gallops Respiratory: CTA bilaterally, no wheezing, no rhonchi Abdominal: Soft, NT, ND, bowel sounds + Extremities: no edema, no cyanosis    The results of significant diagnostics from this hospitalization (including imaging, microbiology, ancillary and laboratory) are listed below for reference.     Microbiology: Recent Results (from the past 240 hour(s))  Culture, blood (Routine X 2) w Reflex to ID Panel     Status: None (Preliminary result)   Collection Time: 12/20/17 12:12 PM  Result Value Ref Range Status   Specimen Description   Final    LEFT ANTECUBITAL Performed at [facility] [address] [place], [place] [address]    Special Requests   Final    BOTTLES DRAWN AEROBIC AND ANAEROBIC Blood Culture adequate volume Performed at [facility] [address] [place], [place] [address]    Culture   Final    NO GROWTH 2 DAYS Performed at [facility] [address] [facility] [address] [place], [place] [address]    Report Status PENDING  Incomplete  Culture, blood (Routine X 2) w Reflex to ID Panel     Status: None (Preliminary result)   Collection Time: 12/20/17 12:50 PM  Result Value Ref Range Status   Specimen Description   Final    RIGHT ANTECUBITAL Performed at [facility] [address] [place], [place] [address]    Special Requests   Final    BOTTLES DRAWN AEROBIC AND ANAEROBIC Blood Culture adequate volume Performed at [facility] [address], [place], [place] [address]    Culture   Final    NO GROWTH 2  DAYS Performed at [facility], [place] [address] [place], [place] [address]    Report Status PENDING  Incomplete     Labs: BNP (last 3 results) No results for input(s): BNP in the last 8760 hours. Basic Metabolic Panel: Recent Labs  Lab 12/20/17 1050 12/21/17 0502 12/22/17 0431 12/23/17 0422  NA 132* 135 134* 136  K 4.1 4.6 5.5* 4.4  CL 99 100 100 102  CO2 24 25 24 25   GLUCOSE 236* 231* 283* 183*  BUN 27* 36* 42* 32*  CREATININE 1.21 1.32* 1.37* 1.20  CALCIUM 8.9 9.1 9.3 8.6*   Liver Function Tests: No results for input(s): AST, ALT, ALKPHOS, BILITOT, PROT, ALBUMIN in the last 168 hours. No results for input(s): LIPASE, AMYLASE in the last 168 hours. No results for input(s): AMMONIA in the last 168 hours. CBC: Recent Labs  Lab 12/20/17 1050 12/21/17 0502  WBC 7.7 9.7  NEUTROABS 6.7  --   HGB 11.3* 10.0*  HCT 32.8* 30.8*  MCV 97.6 98.4  PLT 163 139*   Cardiac Enzymes: No results for input(s): CKTOTAL, CKMB, CKMBINDEX, TROPONINI in the last 168 hours. BNP: Invalid input(s): POCBNP CBG: Recent Labs  Lab 12/22/17 0731 12/22/17 1202 12/22/17 1630 12/22/17 2112 12/23/17 0730  GLUCAP 257* 103* 321* 152* 153*   D-Dimer No  results for input(s): DDIMER in the last 72 hours. Hgb A1c No results for input(s): HGBA1C in the last 72 hours. Lipid Profile No results for input(s): CHOL, HDL, LDLCALC, TRIG, CHOLHDL, LDLDIRECT in the last 72 hours. Thyroid function studies No results for input(s): TSH, T4TOTAL, T3FREE, THYROIDAB in the last 72 hours.  Invalid input(s): FREET3 Anemia work up No results for input(s): VITAMINB12, FOLATE, FERRITIN, TIBC, IRON, RETICCTPCT in the last 72 hours. Urinalysis    Component Value Date/Time   COLORURINE YELLOW 12/20/2017 1250   APPEARANCEUR CLEAR 12/20/2017 1250   LABSPEC 1.007 12/20/2017 1250   PHURINE 6.0 12/20/2017 1250   GLUCOSEU >=500 (A) 12/20/2017 1250   HGBUR SMALL (A) 12/20/2017 1250   BILIRUBINUR NEGATIVE 12/20/2017 1250   BILIRUBINUR neg 09/10/2013 1308   KETONESUR NEGATIVE 12/20/2017 1250   PROTEINUR 30 (A) 12/20/2017 1250   UROBILINOGEN 0.2 09/10/2013 1308   NITRITE NEGATIVE 12/20/2017 1250   LEUKOCYTESUR NEGATIVE 12/20/2017 1250   Sepsis Labs Invalid input(s): PROCALCITONIN,  WBC,  LACTICIDVEN Microbiology Recent Results (from the past 240 hour(s))  Culture, blood (Routine X 2) w Reflex to ID Panel     Status: None (Preliminary result)   Collection Time: 12/20/17 12:12 PM  Result Value Ref Range Status   Specimen Description   Final    LEFT ANTECUBITAL Performed at [facility], [place] [address], [place], [place] [address]    Special Requests   Final    BOTTLES DRAWN AEROBIC AND ANAEROBIC Blood Culture adequate volume Performed at [facility] [address], [place], [place] [address]    Culture   Final    NO GROWTH 2 DAYS Performed at [facility] [address] [place], [place] [address]    Report Status PENDING  Incomplete  Culture, blood (Routine X 2) w Reflex to ID Panel     Status: None (Preliminary result)   Collection Time: 12/20/17 12:50 PM  Result Value Ref Range Status   Specimen  Description   Final    RIGHT ANTECUBITAL Performed at [facility] [address], [place],  [address]    Special Requests  Final    BOTTLES DRAWN AEROBIC AND ANAEROBIC Blood Culture adequate volume Performed at [facility] [address] [place], [place] [address]    Culture   Final    NO GROWTH 2 DAYS Performed at [facility] [address], [place], [place] [address]    Report Status PENDING  Incomplete     Time coordinating discharge: 40 minutes  SIGNED:   Charlynne Cousins, MD  [facility] 12/23/2017, 8:45 AM   If 7PM-7AM, please contact night-coverage [email or web address] Password [email or web address]

## 2017-12-23 NOTE — Progress Notes (Addendum)
Discharge to home, with Mae, extensive conversation to assist explaining O2 use with patient. Pt very anxious about Oxygen, better,  acknowledged understanding and receptive to the teaching. Instructions reviewed with paitent and acknowledged understanding.  Questions answered. SRP, RN

## 2017-12-23 NOTE — Progress Notes (Signed)
Report received from previous RN. I agree with her assessment and will carry out the plan of care. Asaro, Makaleigh Reinard I  

## 2017-12-23 NOTE — Progress Notes (Signed)
Pt refused influenza vaccine and pneumonia vaccine.

## 2017-12-23 NOTE — Progress Notes (Addendum)
SATURATION QUALIFICATIONS: (This note is used to comply with regulatory documentation for home oxygen)  Patient Saturations on Room Air at Rest  92%   Patient Saturations on Room Air while Ambulating 83%  Patient Saturations on 3 Liters of oxygen while Ambulating 93%  Please briefly explain why patient needs home oxygen:  Pt at baseline without activity 92%, pt ambulates becomes short of breath and low endurance, placed on oxygen 3 liters, pt recover to 93% max

## 2017-12-25 LAB — CULTURE, BLOOD (ROUTINE X 2)
CULTURE: NO GROWTH
Culture: NO GROWTH
SPECIAL REQUESTS: ADEQUATE
Special Requests: ADEQUATE

## 2017-12-31 DIAGNOSIS — J189 Pneumonia, unspecified organism: Secondary | ICD-10-CM | POA: Diagnosis not present

## 2017-12-31 DIAGNOSIS — N179 Acute kidney failure, unspecified: Secondary | ICD-10-CM | POA: Diagnosis not present

## 2017-12-31 DIAGNOSIS — J441 Chronic obstructive pulmonary disease with (acute) exacerbation: Secondary | ICD-10-CM | POA: Diagnosis not present

## 2017-12-31 DIAGNOSIS — F5101 Primary insomnia: Secondary | ICD-10-CM | POA: Diagnosis not present

## 2017-12-31 DIAGNOSIS — F419 Anxiety disorder, unspecified: Secondary | ICD-10-CM | POA: Diagnosis not present

## 2017-12-31 DIAGNOSIS — I509 Heart failure, unspecified: Secondary | ICD-10-CM | POA: Diagnosis not present

## 2017-12-31 DIAGNOSIS — D649 Anemia, unspecified: Secondary | ICD-10-CM | POA: Diagnosis not present

## 2018-01-05 DIAGNOSIS — D649 Anemia, unspecified: Secondary | ICD-10-CM | POA: Diagnosis not present

## 2018-01-05 DIAGNOSIS — N179 Acute kidney failure, unspecified: Secondary | ICD-10-CM | POA: Diagnosis not present

## 2018-01-05 DIAGNOSIS — N183 Chronic kidney disease, stage 3 (moderate): Secondary | ICD-10-CM | POA: Diagnosis not present

## 2018-02-09 DIAGNOSIS — G458 Other transient cerebral ischemic attacks and related syndromes: Secondary | ICD-10-CM | POA: Diagnosis not present

## 2018-02-09 DIAGNOSIS — I10 Essential (primary) hypertension: Secondary | ICD-10-CM | POA: Diagnosis not present

## 2018-02-09 DIAGNOSIS — D72829 Elevated white blood cell count, unspecified: Secondary | ICD-10-CM | POA: Diagnosis not present

## 2018-02-09 DIAGNOSIS — D649 Anemia, unspecified: Secondary | ICD-10-CM | POA: Diagnosis not present

## 2018-02-09 DIAGNOSIS — E1151 Type 2 diabetes mellitus with diabetic peripheral angiopathy without gangrene: Secondary | ICD-10-CM | POA: Diagnosis not present

## 2018-02-09 DIAGNOSIS — G894 Chronic pain syndrome: Secondary | ICD-10-CM | POA: Diagnosis not present

## 2018-02-09 DIAGNOSIS — F411 Generalized anxiety disorder: Secondary | ICD-10-CM | POA: Diagnosis not present

## 2018-02-09 DIAGNOSIS — E1140 Type 2 diabetes mellitus with diabetic neuropathy, unspecified: Secondary | ICD-10-CM | POA: Diagnosis not present

## 2018-02-09 DIAGNOSIS — E785 Hyperlipidemia, unspecified: Secondary | ICD-10-CM | POA: Diagnosis not present

## 2018-02-09 DIAGNOSIS — F321 Major depressive disorder, single episode, moderate: Secondary | ICD-10-CM | POA: Diagnosis not present

## 2018-02-09 DIAGNOSIS — Z125 Encounter for screening for malignant neoplasm of prostate: Secondary | ICD-10-CM | POA: Diagnosis not present

## 2018-02-09 DIAGNOSIS — Z8701 Personal history of pneumonia (recurrent): Secondary | ICD-10-CM | POA: Diagnosis not present

## 2018-02-13 ENCOUNTER — Inpatient Hospital Stay (HOSPITAL_COMMUNITY)
Admission: EM | Admit: 2018-02-13 | Discharge: 2018-02-16 | DRG: 190 | Disposition: A | Discharge status: Home / Self Care | Payer: Medicare Other | Attending: Internal Medicine | Admitting: Internal Medicine

## 2018-02-13 ENCOUNTER — Encounter (HOSPITAL_COMMUNITY): Payer: Self-pay | Admitting: *Deleted

## 2018-02-13 ENCOUNTER — Other Ambulatory Visit: Payer: Self-pay

## 2018-02-13 ENCOUNTER — Emergency Department (HOSPITAL_COMMUNITY): Payer: Medicare Other

## 2018-02-13 ENCOUNTER — Observation Stay (HOSPITAL_COMMUNITY): Payer: Medicare Other

## 2018-02-13 DIAGNOSIS — R0689 Other abnormalities of breathing: Secondary | ICD-10-CM | POA: Diagnosis not present

## 2018-02-13 DIAGNOSIS — R0602 Shortness of breath: Secondary | ICD-10-CM | POA: Diagnosis not present

## 2018-02-13 DIAGNOSIS — Z9981 Dependence on supplemental oxygen: Secondary | ICD-10-CM

## 2018-02-13 DIAGNOSIS — F17210 Nicotine dependence, cigarettes, uncomplicated: Secondary | ICD-10-CM | POA: Diagnosis present

## 2018-02-13 DIAGNOSIS — I129 Hypertensive chronic kidney disease with stage 1 through stage 4 chronic kidney disease, or unspecified chronic kidney disease: Secondary | ICD-10-CM | POA: Diagnosis not present

## 2018-02-13 DIAGNOSIS — Z79899 Other long term (current) drug therapy: Secondary | ICD-10-CM

## 2018-02-13 DIAGNOSIS — E1140 Type 2 diabetes mellitus with diabetic neuropathy, unspecified: Secondary | ICD-10-CM | POA: Diagnosis present

## 2018-02-13 DIAGNOSIS — N189 Chronic kidney disease, unspecified: Secondary | ICD-10-CM

## 2018-02-13 DIAGNOSIS — J9601 Acute respiratory failure with hypoxia: Secondary | ICD-10-CM | POA: Diagnosis present

## 2018-02-13 DIAGNOSIS — Z7982 Long term (current) use of aspirin: Secondary | ICD-10-CM

## 2018-02-13 DIAGNOSIS — E872 Acidosis, unspecified: Secondary | ICD-10-CM

## 2018-02-13 DIAGNOSIS — J9621 Acute and chronic respiratory failure with hypoxia: Secondary | ICD-10-CM | POA: Diagnosis present

## 2018-02-13 DIAGNOSIS — E1151 Type 2 diabetes mellitus with diabetic peripheral angiopathy without gangrene: Secondary | ICD-10-CM | POA: Diagnosis present

## 2018-02-13 DIAGNOSIS — E1169 Type 2 diabetes mellitus with other specified complication: Secondary | ICD-10-CM

## 2018-02-13 DIAGNOSIS — Z888 Allergy status to other drugs, medicaments and biological substances status: Secondary | ICD-10-CM

## 2018-02-13 DIAGNOSIS — R062 Wheezing: Secondary | ICD-10-CM | POA: Diagnosis not present

## 2018-02-13 DIAGNOSIS — R0603 Acute respiratory distress: Secondary | ICD-10-CM

## 2018-02-13 DIAGNOSIS — N183 Chronic kidney disease, stage 3 unspecified: Secondary | ICD-10-CM | POA: Diagnosis present

## 2018-02-13 DIAGNOSIS — E1122 Type 2 diabetes mellitus with diabetic chronic kidney disease: Secondary | ICD-10-CM | POA: Diagnosis not present

## 2018-02-13 DIAGNOSIS — Z85828 Personal history of other malignant neoplasm of skin: Secondary | ICD-10-CM

## 2018-02-13 DIAGNOSIS — Z7984 Long term (current) use of oral hypoglycemic drugs: Secondary | ICD-10-CM

## 2018-02-13 DIAGNOSIS — M4690 Unspecified inflammatory spondylopathy, site unspecified: Secondary | ICD-10-CM | POA: Diagnosis present

## 2018-02-13 DIAGNOSIS — Z9119 Patient's noncompliance with other medical treatment and regimen: Secondary | ICD-10-CM

## 2018-02-13 DIAGNOSIS — R7989 Other specified abnormal findings of blood chemistry: Secondary | ICD-10-CM

## 2018-02-13 DIAGNOSIS — J441 Chronic obstructive pulmonary disease with (acute) exacerbation: Principal | ICD-10-CM | POA: Diagnosis present

## 2018-02-13 DIAGNOSIS — M1611 Unilateral primary osteoarthritis, right hip: Secondary | ICD-10-CM | POA: Diagnosis present

## 2018-02-13 DIAGNOSIS — R791 Abnormal coagulation profile: Secondary | ICD-10-CM | POA: Diagnosis not present

## 2018-02-13 DIAGNOSIS — Z8673 Personal history of transient ischemic attack (TIA), and cerebral infarction without residual deficits: Secondary | ICD-10-CM

## 2018-02-13 DIAGNOSIS — R05 Cough: Secondary | ICD-10-CM | POA: Diagnosis not present

## 2018-02-13 DIAGNOSIS — R0902 Hypoxemia: Secondary | ICD-10-CM | POA: Diagnosis not present

## 2018-02-13 DIAGNOSIS — Z8701 Personal history of pneumonia (recurrent): Secondary | ICD-10-CM

## 2018-02-13 DIAGNOSIS — E785 Hyperlipidemia, unspecified: Secondary | ICD-10-CM | POA: Diagnosis present

## 2018-02-13 DIAGNOSIS — Z91041 Radiographic dye allergy status: Secondary | ICD-10-CM

## 2018-02-13 DIAGNOSIS — F17200 Nicotine dependence, unspecified, uncomplicated: Secondary | ICD-10-CM | POA: Diagnosis present

## 2018-02-13 DIAGNOSIS — J449 Chronic obstructive pulmonary disease, unspecified: Secondary | ICD-10-CM | POA: Insufficient documentation

## 2018-02-13 DIAGNOSIS — R509 Fever, unspecified: Secondary | ICD-10-CM | POA: Diagnosis not present

## 2018-02-13 DIAGNOSIS — R000 Tachycardia, unspecified: Secondary | ICD-10-CM | POA: Diagnosis not present

## 2018-02-13 DIAGNOSIS — Z881 Allergy status to other antibiotic agents status: Secondary | ICD-10-CM

## 2018-02-13 LAB — COMPREHENSIVE METABOLIC PANEL
ALT: 19 U/L (ref 0–44)
AST: 23 U/L (ref 15–41)
Albumin: 4.4 g/dL (ref 3.5–5.0)
Alkaline Phosphatase: 78 U/L (ref 38–126)
Anion gap: 16 — ABNORMAL HIGH (ref 5–15)
BUN: 21 mg/dL (ref 8–23)
CO2: 24 mmol/L (ref 22–32)
Calcium: 9.5 mg/dL (ref 8.9–10.3)
Chloride: 93 mmol/L — ABNORMAL LOW (ref 98–111)
Creatinine, Ser: 1.2 mg/dL (ref 0.61–1.24)
GFR calc Af Amer: >60 mL/min (ref >60)
GFR calc non Af Amer: >60 mL/min (ref >60)
Glucose, Bld: 272 mg/dL — ABNORMAL HIGH (ref 70–99)
Potassium: 3.7 mmol/L (ref 3.5–5.1)
Sodium: 133 mmol/L — ABNORMAL LOW (ref 135–145)
TOTAL PROTEIN: 7.4 g/dL (ref 6.5–8.1)
Total Bilirubin: 1.6 mg/dL — ABNORMAL HIGH (ref 0.3–1.2)

## 2018-02-13 LAB — CBC WITH DIFFERENTIAL/PLATELET
Abs Immature Granulocytes: 0.05 10*3/uL (ref 0.00–0.07)
BASOS ABS: 0 10*3/uL (ref 0.0–0.1)
Basophils Relative: 0 %
EOS PCT: 0 %
Eosinophils Absolute: 0 10*3/uL (ref 0.0–0.5)
HCT: 39.8 % (ref 39.0–52.0)
Hemoglobin: 13.3 g/dL (ref 13.0–17.0)
IMMATURE GRANULOCYTES: 1 %
Lymphocytes Relative: 11 %
Lymphs Abs: 0.8 10*3/uL (ref 0.7–4.0)
MCH: 34.9 pg — ABNORMAL HIGH (ref 26.0–34.0)
MCHC: 33.4 g/dL (ref 30.0–36.0)
MCV: 104.5 fL — ABNORMAL HIGH (ref 80.0–100.0)
Monocytes Absolute: 0.4 10*3/uL (ref 0.1–1.0)
Monocytes Relative: 6 %
Neutro Abs: 5.7 10*3/uL (ref 1.7–7.7)
Neutrophils Relative %: 82 %
Platelets: 129 10*3/uL — ABNORMAL LOW (ref 150–400)
RBC: 3.81 MIL/uL — ABNORMAL LOW (ref 4.22–5.81)
RDW: 15.4 % (ref 11.5–15.5)
WBC: 6.9 10*3/uL (ref 4.0–10.5)
nRBC: 0 % (ref 0.0–0.2)

## 2018-02-13 LAB — CBG MONITORING, ED
GLUCOSE-CAPILLARY: 417 mg/dL — AB (ref 70–99)
Glucose-Capillary: 243 mg/dL — ABNORMAL HIGH (ref 70–99)
Glucose-Capillary: 240 mg/dL — ABNORMAL HIGH (ref 70–99)

## 2018-02-13 LAB — URINALYSIS, ROUTINE W REFLEX MICROSCOPIC
BILIRUBIN URINE: NEGATIVE
Bacteria, UA: NONE SEEN
Glucose, UA: >=500 mg/dL — AB
KETONES UR: 5 mg/dL — AB
Leukocytes, UA: NEGATIVE
NITRITE: NEGATIVE
PROTEIN: 100 mg/dL — AB
Specific Gravity, Urine: 1.02 (ref 1.005–1.030)
pH: 6 (ref 5.0–8.0)

## 2018-02-13 LAB — BRAIN NATRIURETIC PEPTIDE: B Natriuretic Peptide: 358.9 pg/mL — ABNORMAL HIGH (ref 0.0–100.0)

## 2018-02-13 LAB — INFLUENZA PANEL BY PCR (TYPE A & B)
INFLAPCR: NEGATIVE
Influenza B By PCR: NEGATIVE

## 2018-02-13 LAB — I-STAT TROPONIN, ED: Troponin i, poc: 0.01 ng/mL (ref 0.00–0.08)

## 2018-02-13 LAB — D-DIMER, QUANTITATIVE: D-Dimer, Quant: 1.32 ug/mL-FEU — ABNORMAL HIGH (ref 0.00–0.50)

## 2018-02-13 LAB — LACTIC ACID, PLASMA
Lactic Acid, Venous: 5.6 mmol/L (ref 0.5–1.9)
Lactic Acid, Venous: 4 mmol/L (ref 0.5–1.9)

## 2018-02-13 MED ORDER — IPRATROPIUM-ALBUTEROL 0.5-2.5 (3) MG/3ML IN SOLN
3.0000 mL | Freq: Once | RESPIRATORY_TRACT | Status: AC
  Administered 2018-02-13: 3 mL via RESPIRATORY_TRACT
  Filled 2018-02-13: qty 3

## 2018-02-13 MED ORDER — IOPAMIDOL (ISOVUE-370) INJECTION 76%
INTRAVENOUS | Status: AC
  Filled 2018-02-13: qty 100

## 2018-02-13 MED ORDER — GUAIFENESIN ER 600 MG PO TB12
600.0000 mg | ORAL_TABLET | Freq: Two times a day (BID) | ORAL | Status: DC
  Administered 2018-02-13 – 2018-02-16 (×6): 600 mg via ORAL
  Filled 2018-02-13 (×6): qty 1

## 2018-02-13 MED ORDER — GABAPENTIN 300 MG PO CAPS
300.0000 mg | ORAL_CAPSULE | Freq: Every day | ORAL | Status: DC
  Administered 2018-02-13 – 2018-02-15 (×3): 300 mg via ORAL
  Filled 2018-02-13 (×4): qty 1

## 2018-02-13 MED ORDER — SODIUM CHLORIDE 0.9 % IV SOLN
500.0000 mg | INTRAVENOUS | Status: DC
  Administered 2018-02-13 – 2018-02-15 (×3): 500 mg via INTRAVENOUS
  Filled 2018-02-13 (×3): qty 500

## 2018-02-13 MED ORDER — MENTHOL 3 MG MT LOZG
1.0000 | LOZENGE | OROMUCOSAL | Status: DC | PRN
  Administered 2018-02-14: 3 mg via ORAL
  Filled 2018-02-13 (×3): qty 9

## 2018-02-13 MED ORDER — INSULIN ASPART 100 UNIT/ML ~~LOC~~ SOLN
0.0000 [IU] | Freq: Three times a day (TID) | SUBCUTANEOUS | Status: DC
  Administered 2018-02-13: 10 [IU] via SUBCUTANEOUS
  Administered 2018-02-14: 7 [IU] via SUBCUTANEOUS
  Administered 2018-02-14: 3 [IU] via SUBCUTANEOUS
  Administered 2018-02-15 (×2): 2 [IU] via SUBCUTANEOUS
  Administered 2018-02-15: 3 [IU] via SUBCUTANEOUS
  Administered 2018-02-16: 5 [IU] via SUBCUTANEOUS
  Administered 2018-02-16: 3 [IU] via SUBCUTANEOUS
  Filled 2018-02-13: qty 1

## 2018-02-13 MED ORDER — ACETAMINOPHEN 650 MG RE SUPP: 650.0000 mg | Freq: Four times a day (QID) | RECTAL | Status: DC | PRN

## 2018-02-13 MED ORDER — SODIUM CHLORIDE 0.9 % IV BOLUS
500.0000 mL | Freq: Once | INTRAVENOUS | Status: AC
  Administered 2018-02-13: 500 mL via INTRAVENOUS

## 2018-02-13 MED ORDER — SODIUM CHLORIDE 0.9 % IV SOLN
1.0000 g | INTRAVENOUS | Status: DC
  Administered 2018-02-13 – 2018-02-15 (×3): 1 g via INTRAVENOUS
  Filled 2018-02-13: qty 10
  Filled 2018-02-13 (×2): qty 1

## 2018-02-13 MED ORDER — ACETAMINOPHEN 325 MG PO TABS: 650.0000 mg | ORAL_TABLET | Freq: Four times a day (QID) | ORAL | Status: DC | PRN

## 2018-02-13 MED ORDER — TRAZODONE HCL 50 MG PO TABS
50.0000 mg | ORAL_TABLET | Freq: Every day | ORAL | Status: DC
  Administered 2018-02-13 – 2018-02-15 (×3): 50 mg via ORAL
  Filled 2018-02-13 (×4): qty 1

## 2018-02-13 MED ORDER — TRAMADOL HCL 50 MG PO TABS
50.0000 mg | ORAL_TABLET | Freq: Three times a day (TID) | ORAL | Status: DC | PRN
  Administered 2018-02-13 – 2018-02-16 (×4): 50 mg via ORAL
  Filled 2018-02-13 (×4): qty 1

## 2018-02-13 MED ORDER — ASPIRIN EC 81 MG PO TBEC
81.0000 mg | DELAYED_RELEASE_TABLET | Freq: Every day | ORAL | Status: DC
  Administered 2018-02-13 – 2018-02-16 (×4): 81 mg via ORAL
  Filled 2018-02-13 (×4): qty 1

## 2018-02-13 MED ORDER — GLIPIZIDE 10 MG PO TABS
10.0000 mg | ORAL_TABLET | Freq: Two times a day (BID) | ORAL | Status: DC
  Administered 2018-02-14: 10 mg via ORAL
  Filled 2018-02-13: qty 1

## 2018-02-13 MED ORDER — ONDANSETRON HCL 4 MG PO TABS
4.0000 mg | ORAL_TABLET | Freq: Four times a day (QID) | ORAL | Status: DC | PRN
  Administered 2018-02-13: 4 mg via ORAL
  Filled 2018-02-13: qty 1

## 2018-02-13 MED ORDER — SERTRALINE HCL 50 MG PO TABS
50.0000 mg | ORAL_TABLET | Freq: Every day | ORAL | Status: DC
  Administered 2018-02-13 – 2018-02-16 (×4): 50 mg via ORAL
  Filled 2018-02-13 (×4): qty 1

## 2018-02-13 MED ORDER — SODIUM CHLORIDE 0.9 % IV SOLN: 250.0000 mL | INTRAVENOUS | Status: DC | PRN

## 2018-02-13 MED ORDER — IPRATROPIUM-ALBUTEROL 0.5-2.5 (3) MG/3ML IN SOLN
3.0000 mL | Freq: Four times a day (QID) | RESPIRATORY_TRACT | Status: DC
  Administered 2018-02-13 – 2018-02-14 (×5): 3 mL via RESPIRATORY_TRACT
  Filled 2018-02-13 (×4): qty 3

## 2018-02-13 MED ORDER — ONDANSETRON HCL 4 MG/2ML IJ SOLN: 4.0000 mg | Freq: Four times a day (QID) | INTRAMUSCULAR | Status: DC | PRN

## 2018-02-13 MED ORDER — SODIUM CHLORIDE (PF) 0.9 % IJ SOLN
INTRAMUSCULAR | Status: AC
  Filled 2018-02-13: qty 50

## 2018-02-13 MED ORDER — SODIUM CHLORIDE 0.9% FLUSH
3.0000 mL | Freq: Two times a day (BID) | INTRAVENOUS | Status: DC
  Administered 2018-02-13 – 2018-02-16 (×4): 3 mL via INTRAVENOUS

## 2018-02-13 MED ORDER — IOPAMIDOL (ISOVUE-370) INJECTION 76%: 100.0000 mL | Freq: Once | INTRAVENOUS | Status: DC | PRN

## 2018-02-13 MED ORDER — TECHNETIUM TO 99M ALBUMIN AGGREGATED
4.4000 | Freq: Once | INTRAVENOUS | Status: AC
  Administered 2018-02-13: 4.4 via INTRAVENOUS

## 2018-02-13 MED ORDER — ENOXAPARIN SODIUM 40 MG/0.4ML ~~LOC~~ SOLN
40.0000 mg | SUBCUTANEOUS | Status: DC
  Administered 2018-02-13 – 2018-02-15 (×3): 40 mg via SUBCUTANEOUS
  Filled 2018-02-13 (×4): qty 0.4

## 2018-02-13 MED ORDER — GUAIFENESIN-CODEINE 100-10 MG/5ML PO SOLN
5.0000 mL | Freq: Four times a day (QID) | ORAL | Status: DC | PRN
  Administered 2018-02-13 – 2018-02-14 (×4): 5 mL via ORAL
  Filled 2018-02-13 (×4): qty 5

## 2018-02-13 MED ORDER — SODIUM CHLORIDE 0.9 % IV BOLUS
1000.0000 mL | Freq: Once | INTRAVENOUS | Status: AC
  Administered 2018-02-13: 1000 mL via INTRAVENOUS

## 2018-02-13 MED ORDER — SODIUM CHLORIDE 0.9% FLUSH: 3.0000 mL | INTRAVENOUS | Status: DC | PRN

## 2018-02-13 MED ORDER — ATENOLOL 50 MG PO TABS
50.0000 mg | ORAL_TABLET | Freq: Two times a day (BID) | ORAL | Status: DC
  Administered 2018-02-13 – 2018-02-16 (×6): 50 mg via ORAL
  Filled 2018-02-13: qty 2
  Filled 2018-02-13: qty 1
  Filled 2018-02-13: qty 2
  Filled 2018-02-13: qty 1
  Filled 2018-02-13 (×2): qty 2
  Filled 2018-02-13: qty 1

## 2018-02-13 MED ORDER — BENZONATATE 100 MG PO CAPS
200.0000 mg | ORAL_CAPSULE | Freq: Three times a day (TID) | ORAL | Status: DC | PRN
  Administered 2018-02-13 – 2018-02-14 (×2): 200 mg via ORAL
  Filled 2018-02-13 (×2): qty 2

## 2018-02-13 MED ORDER — TECHNETIUM TC 99M DIETHYLENETRIAME-PENTAACETIC ACID
31.5000 | Freq: Once | INTRAVENOUS | Status: AC | PRN
  Administered 2018-02-13: 31.5 via RESPIRATORY_TRACT

## 2018-02-13 MED ORDER — ATORVASTATIN CALCIUM 20 MG PO TABS
20.0000 mg | ORAL_TABLET | Freq: Every day | ORAL | Status: DC
  Administered 2018-02-13 – 2018-02-15 (×3): 20 mg via ORAL
  Filled 2018-02-13: qty 2
  Filled 2018-02-13 (×2): qty 1
  Filled 2018-02-13: qty 2
  Filled 2018-02-13: qty 1

## 2018-02-13 MED ORDER — ASPIRIN-ACETAMINOPHEN-CAFFEINE 250-250-65 MG PO TABS
1.0000 | ORAL_TABLET | Freq: Four times a day (QID) | ORAL | Status: DC | PRN
  Filled 2018-02-13: qty 1

## 2018-02-13 MED ORDER — METHYLPREDNISOLONE SODIUM SUCC 40 MG IJ SOLR
40.0000 mg | Freq: Three times a day (TID) | INTRAMUSCULAR | Status: DC
  Administered 2018-02-13 – 2018-02-15 (×6): 40 mg via INTRAVENOUS
  Filled 2018-02-13 (×6): qty 1

## 2018-02-13 NOTE — ED Notes (Signed)
Date and time results received: 02/13/18 2133   Test:Lactic Critical Value: 4.0  Name of Provider Notified: notified primary RN-will notify provider  Orders Received? Or Actions Taken?: see orders

## 2018-02-13 NOTE — ED Notes (Signed)
Patient O2 was turned off at 1220 and was now just turned back on. Patient O2 dropped down to 90% and Heart Rate began to rise 120's to 130's. ED Provider notified.

## 2018-02-13 NOTE — Progress Notes (Signed)
Received report, nurse states the patient is currently in a procedure & should be there for about an hour.

## 2018-02-13 NOTE — ED Triage Notes (Signed)
Per EMS pt coming form home with c/o shortness of breath x 2 days, pt was using O2 3L via n/c that was not prescribed to him (left over from his sister who passed away). Per EMS pt has expiratory wheezing throughout, 10mg  albuterol, 1 mg atrovent and 125 mg solumedrol administered en route with minimal relief.

## 2018-02-13 NOTE — ED Notes (Signed)
Pts CBG was 417

## 2018-02-13 NOTE — ED Notes (Signed)
Bed: LR17 Expected date:  Expected time:  Means of arrival:  Comments: 68 yo SOB x 2 days

## 2018-02-13 NOTE — ED Notes (Signed)
ED TO INPATIENT HANDOFF REPORT  Name/Age/Gender Adrian Andrews 68 y.o. male  Code Status    Code Status Orders  (From admission, onward)         Start     Ordered   02/13/18 1906  Full code  Continuous     02/13/18 1906        Code Status History    Date Active Date Inactive Code Status Order ID Comments User Context   12/20/2017 1608 12/23/2017 1737 Full Code [id number]  Tawni Millers, MD Inpatient   02/21/2017 1701 02/22/2017 1449 Full Code [id number]  Gabriel Earing, PA-C Inpatient      Home/SNF/Other Home  Chief Complaint shob  Level of Care/Admitting Diagnosis ED Disposition    ED Disposition Condition [facility] [[id number]]  Level of Care: Stepdown [14]  Admit to SDU based on following criteria: Hemodynamic compromise or significant risk of instability:  Patient requiring short term acute titration and management of vasoactive drips, and invasive monitoring (i.e., CVP and Arterial line).  Diagnosis: COPD exacerbation [facility]) [[id number]]  Admitting Physician: Elmarie Shiley [phone]  Attending Physician: Niel Hummer A [[id number]]  PT Class (Do Not Modify): Observation [104]  PT Acc Code (Do Not Modify): Observation [10022]       Medical History Past Medical History:  Diagnosis Date  . Anxiety   . Arthritis    back, right hip  . Chronic kidney disease    stage 3  . Diabetes mellitus without complication ([place])   . Dyspnea   . Heart murmur   . Hyperlipidemia   . Hypertension   . Neuropathy   . Peripheral vascular disease ([place])    Carotid artery stenosis, s/p bilat stents  . Skin cancer    basal cell right cheek, left ear  . Stroke [facility])    noted on MRI    Allergies Allergies  Allergen Reactions  . Clindamycin/Lincomycin     Throat tightens up  . Contrast Media [Iodinated Diagnostic Agents] Shortness Of Breath    11/20/2004 reaction with CTA CAP  . Metformin And Related Other (See  Comments)    Effected kidney function    IV Location/Drains/Wounds Patient Lines/Drains/Airways Status   Active Line/Drains/Airways    Name:   Placement date:   Placement time:   Site:   Days:   Peripheral IV 02/13/18 Left Antecubital   02/13/18    -    Antecubital   less than 1   Peripheral IV 02/13/18 Right Antecubital   02/13/18    1705    Antecubital   less than 1          Labs/Imaging Results for orders placed or performed during the hospital encounter of 02/13/18 (from the past 48 hour(s))  CBC with Differential     Status: Abnormal   Collection Time: 02/13/18 10:20 AM  Result Value Ref Range   WBC 6.9 4.0 - 10.5 K/uL   RBC 3.81 (L) 4.22 - 5.81 MIL/uL   Hemoglobin 13.3 13.0 - 17.0 g/dL   HCT 39.8 39.0 - 52.0 %   MCV 104.5 (H) 80.0 - 100.0 fL   MCH 34.9 (H) 26.0 - 34.0 pg   MCHC 33.4 30.0 - 36.0 g/dL    Comment: CORRECTED FOR INTERFERING SUBSTANCE PREWARMING TECHNIQUE USED    RDW 15.4 11.5 - 15.5 %   Platelets 129 (L) 150 - 400 K/uL    Comment: Immature Platelet Fraction may be  clinically indicated, consider ordering this additional test [id number]    nRBC 0.0 0.0 - 0.2 %   Neutrophils Relative % 82 %   Neutro Abs 5.7 1.7 - 7.7 K/uL   Lymphocytes Relative 11 %   Lymphs Abs 0.8 0.7 - 4.0 K/uL   Monocytes Relative 6 %   Monocytes Absolute 0.4 0.1 - 1.0 K/uL   Eosinophils Relative 0 %   Eosinophils Absolute 0.0 0.0 - 0.5 K/uL   Basophils Relative 0 %   Basophils Absolute 0.0 0.0 - 0.1 K/uL   Immature Granulocytes 1 %   Abs Immature Granulocytes 0.05 0.00 - 0.07 K/uL    Comment: Performed at [facility], [place] [address], [place], [place] [address]  Comprehensive metabolic panel     Status: Abnormal   Collection Time: 02/13/18 10:20 AM  Result Value Ref Range   Sodium 133 (L) 135 - 145 mmol/L   Potassium 3.7 3.5 - 5.1 mmol/L   Chloride 93 (L) 98 - 111 mmol/L   CO2 24 22 - 32 mmol/L   Glucose, Bld 272 (H) 70 - 99 mg/dL   BUN 21 8 - 23 mg/dL    Creatinine, Ser 1.20 0.61 - 1.24 mg/dL   Calcium 9.5 8.9 - 10.3 mg/dL   Total Protein 7.4 6.5 - 8.1 g/dL   Albumin 4.4 3.5 - 5.0 g/dL   AST 23 15 - 41 U/L   ALT 19 0 - 44 U/L   Alkaline Phosphatase 78 38 - 126 U/L   Total Bilirubin 1.6 (H) 0.3 - 1.2 mg/dL   GFR calc non Af Amer >60 >60 mL/min   GFR calc Af Amer >60 >60 mL/min   Anion gap 16 (H) 5 - 15    Comment: Performed at [facility], [place] [address] [place], [place] [address]  Brain natriuretic peptide     Status: Abnormal   Collection Time: 02/13/18 10:20 AM  Result Value Ref Range   B Natriuretic Peptide 358.9 (H) 0.0 - 100.0 pg/mL    Comment: Performed at [facility], [place] [address], [place], [place] [address]  D-dimer, quantitative (not at [facility])     Status: Abnormal   Collection Time: 02/13/18 10:20 AM  Result Value Ref Range   D-Dimer, Quant 1.32 (H) 0.00 - 0.50 ug/mL-FEU    Comment: (NOTE) At the manufacturer cut-off of 0.50 ug/mL FEU, this assay has been documented to exclude PE with a sensitivity and negative predictive value of 97 to 99%.  At this time, this assay has not been approved by the FDA to exclude DVT/VTE. Results should be correlated with clinical presentation. Performed at [facility], [place] [address] [place], [place] [address]   I-Stat Troponin, ED (not at [facility])     Status: None   Collection Time: 02/13/18 10:32 AM  Result Value Ref Range   Troponin i, poc 0.01 0.00 - 0.08 ng/mL   Comment 3            Comment: Due to the release kinetics of cTnI, a negative result within the first hours of the onset of symptoms does not rule out myocardial infarction with certainty. If myocardial infarction is still suspected, repeat the test at appropriate intervals.   CBG monitoring, ED     Status: Abnormal   Collection Time: 02/13/18 10:36 AM  Result Value Ref Range   Glucose-Capillary 243 (H) 70 - 99 mg/dL  Lactic acid, plasma     Status:  Abnormal   Collection Time: 02/13/18  2:42 PM  Result Value Ref Range   Lactic Acid, Venous 5.6 (HH) 0.5 - 1.9 mmol/L    Comment: CRITICAL RESULT CALLED TO, READ BACK BY AND VERIFIED WITH: Alanson Aly. RN @1521  ON 01.31.2020 BY COHEN,K Performed at [facility], [place] [address], [place], [place] [address]   Influenza panel by PCR (type A & B)     Status: None   Collection Time: 02/13/18  3:14 PM  Result Value Ref Range   Influenza A By PCR NEGATIVE NEGATIVE   Influenza B By PCR NEGATIVE NEGATIVE    Comment: (NOTE) The Xpert Xpress Flu assay is intended as an aid in the diagnosis of  influenza and should not be used as a sole basis for treatment.  This  assay is FDA approved for nasopharyngeal swab specimens only. Nasal  washings and aspirates are unacceptable for Xpert Xpress Flu testing. Performed at [facility], [place] [address], [place], [place] [address]   CBG monitoring, ED     Status: Abnormal   Collection Time: 02/13/18  5:25 PM  Result Value Ref Range   Glucose-Capillary 417 (H) 70 - 99 mg/dL  Lactic acid, plasma     Status: Abnormal   Collection Time: 02/13/18  5:35 PM  Result Value Ref Range   Lactic Acid, Venous 4.0 (HH) 0.5 - 1.9 mmol/L    Comment: CRITICAL RESULT CALLED TO, READ BACK BY AND VERIFIED WITH: DOSTER,T RN @2131  ON 02/13/2018 JACKSON,K Performed at [facility], [place] [address], [place], [place] [address]   Urinalysis, Routine w reflex microscopic     Status: Abnormal   Collection Time: 02/13/18  5:35 PM  Result Value Ref Range   Color, Urine YELLOW YELLOW   APPearance CLEAR CLEAR   Specific Gravity, Urine 1.020 1.005 - 1.030   pH 6.0 5.0 - 8.0   Glucose, UA >=500 (A) NEGATIVE mg/dL   Hgb urine dipstick MODERATE (A) NEGATIVE   Bilirubin Urine NEGATIVE NEGATIVE   Ketones, ur 5 (A) NEGATIVE mg/dL   Protein, ur 100 (A) NEGATIVE mg/dL   Nitrite NEGATIVE NEGATIVE   Leukocytes, UA NEGATIVE NEGATIVE    RBC / HPF 0-5 0 - 5 RBC/hpf   WBC, UA 0-5 0 - 5 WBC/hpf   Bacteria, UA NONE SEEN NONE SEEN    Comment: Performed at [facility], [place] [address], [place], [place] [address]  CBG monitoring, ED     Status: Abnormal   Collection Time: 02/13/18 10:01 PM  Result Value Ref Range   Glucose-Capillary 240 (H) 70 - 99 mg/dL   Dg Chest 2 View  Result Date: 02/13/2018 CLINICAL DATA:  Cough and chest congestion over the last week. EXAM: CHEST - 2 VIEW COMPARISON:  12/20/2017 FINDINGS: Heart size is normal. Chronic aortic atherosclerosis. There may be central bronchial thickening but there is no infiltrate, collapse or effusion. Ordinary degenerative changes affect the spine. IMPRESSION: Possible bronchitis. No consolidation or collapse. Electronically Signed   By: Nelson Chimes M.D.   On: 02/13/2018 11:05   Nm Pulmonary Vent And Perf (v/q Scan)  Result Date: 02/13/2018 CLINICAL DATA:  Shortness of breath for 2 days EXAM: NUCLEAR MEDICINE VENTILATION - PERFUSION LUNG SCAN TECHNIQUE: Ventilation images were obtained in multiple projections using inhaled aerosol Tc-32m DTPA. Perfusion images were obtained in multiple projections after intravenous injection of Tc-31m MAA. RADIOPHARMACEUTICALS:  31.5 mCi of Tc-36m DTPA aerosol inhalation and 4.4 mCi Tc19m MAA IV  COMPARISON:  None Correlation: Chest radiograph 02/13/2018 FINDINGS: Ventilation: Central airway deposition of aerosol. Marked patchy peripheral ventilation throughout both lungs, pattern most consistent with parenchymal lung disease/COPD. Multiple subsegmental perfusion defects are identified. Large subsegmental defects in the upper lobes bilaterally. Diminished ventilation at lateral LEFT lung base. Perfusion: Large subsegmental perfusion defect in lateral RIGHT upper lobe corresponding to a matching ventilatory finding. Matching diminished perfusion at lateral LEFT lung base. Inhomogeneous perfusion throughout the remaining lungs  without additional segmental or subsegmental perfusion defects. Chest radiograph: Bronchitic and probable emphysematous changes. IMPRESSION: Severely patchy impaired ventilation throughout both lungs consistent with parenchymal lung disease/COPD. Large matching ventilation and perfusion defects in lateral RIGHT upper lobe with matching small ventilation perfusion defects at lateral LEFT lower lobe. Findings represent a low probability for pulmonary embolism. Electronically Signed   By: Lavonia Dana M.D.   On: 02/13/2018 16:56   None  Pending Labs Unresulted Labs (From admission, onward)    Start     Ordered   02/20/18 0500  Creatinine, serum  (enoxaparin (LOVENOX)    CrCl >/= 30 ml/min)  Weekly,   R    Comments:  while on enoxaparin therapy    02/13/18 1906   02/14/18 0500  TSH  Tomorrow morning,   R     02/13/18 1727   02/13/18 1913  Lactic acid, plasma  STAT Now then every 3 hours,   R     02/13/18 1612   02/13/18 1623  Culture, blood (routine x 2)  BLOOD CULTURE X 2,   R     02/13/18 1622          Vitals/Pain Today's Vitals   02/13/18 2100 02/13/18 2130 02/13/18 2200 02/13/18 2230  BP: 120/77 106/63 137/79 131/63  Pulse: (!) 113 (!) 114 (!) 121 (!) 110  Resp: 18 14 16 15   Temp:      TempSrc:      SpO2: 96% 96% 95% 99%  Weight:      Height:        Isolation Precautions Droplet precaution  Medications Medications  sodium chloride (PF) 0.9 % injection (0 mLs  Hold 02/13/18 1357)  iopamidol (ISOVUE-370) 76 % injection (  Hold 02/13/18 1357)  iopamidol (ISOVUE-370) 76 % injection 100 mL (has no administration in time range)  guaiFENesin-codeine 100-10 MG/5ML solution 5 mL (5 mLs Oral Given 02/13/18 1517)  methylPREDNISolone sodium succinate (SOLU-MEDROL) 40 mg/mL injection 40 mg (40 mg Intravenous Given 02/13/18 2213)  insulin aspart (novoLOG) injection 0-9 Units (10 Units Subcutaneous Given 02/13/18 1804)  aspirin EC tablet 81 mg (81 mg Oral Given 02/13/18 1917)   aspirin-acetaminophen-caffeine (EXCEDRIN MIGRAINE) per tablet 1 tablet (has no administration in time range)  traMADol (ULTRAM) tablet 50 mg (50 mg Oral Given 02/13/18 1912)  atenolol (TENORMIN) tablet 50 mg (has no administration in time range)  atorvastatin (LIPITOR) tablet 20 mg (has no administration in time range)  sertraline (ZOLOFT) tablet 50 mg (50 mg Oral Given 02/13/18 1917)  traZODone (DESYREL) tablet 50 mg (50 mg Oral Given 02/13/18 2212)  glipiZIDE (GLUCOTROL) tablet 10 mg (has no administration in time range)  gabapentin (NEURONTIN) capsule 300 mg (300 mg Oral Given 02/13/18 2213)  enoxaparin (LOVENOX) injection 40 mg (has no administration in time range)  sodium chloride flush (NS) 0.9 % injection 3 mL (3 mLs Intravenous Given 02/13/18 2214)  sodium chloride flush (NS) 0.9 % injection 3 mL (has no administration in time range)  0.9 %  sodium chloride infusion (has  no administration in time range)  acetaminophen (TYLENOL) tablet 650 mg (has no administration in time range)    Or  acetaminophen (TYLENOL) suppository 650 mg (has no administration in time range)  ondansetron (ZOFRAN) tablet 4 mg (has no administration in time range)    Or  ondansetron (ZOFRAN) injection 4 mg (has no administration in time range)  guaiFENesin (MUCINEX) 12 hr tablet 600 mg (600 mg Oral Given 02/13/18 2200)  ipratropium-albuterol (DUONEB) 0.5-2.5 (3) MG/3ML nebulizer solution 3 mL (3 mLs Nebulization Given 02/13/18 1918)  cefTRIAXone (ROCEPHIN) 1 g in sodium chloride 0.9 % 100 mL IVPB (0 g Intravenous Stopped 02/13/18 1837)  azithromycin (ZITHROMAX) 500 mg in sodium chloride 0.9 % 250 mL IVPB (0 mg Intravenous Stopped 02/13/18 1838)  benzonatate (TESSALON) capsule 200 mg (200 mg Oral Given 02/13/18 2200)  sodium chloride 0.9 % bolus 500 mL (500 mLs Intravenous New Bag/Given 02/13/18 2203)  ipratropium-albuterol (DUONEB) 0.5-2.5 (3) MG/3ML nebulizer solution 3 mL (3 mLs Nebulization Given 02/13/18 1100)   technetium albumin aggregated (MAA) injection solution 4.4 millicurie (4.4 millicuries Intravenous Contrast Given 02/13/18 1600)  technetium TC 79M diethylenetriame-pentaacetic acid (DTPA) injection 20.1 millicurie (00.7 millicuries Inhalation Given 02/13/18 1558)  sodium chloride 0.9 % bolus 1,000 mL (0 mLs Intravenous Stopped 02/13/18 2028)  sodium chloride 0.9 % bolus 500 mL (0 mLs Intravenous Stopped 02/13/18 1837)    Mobility walks

## 2018-02-13 NOTE — ED Provider Notes (Signed)
[facility] DEPT Provider Note   CSN: [id number] Arrival date & time: 02/13/18  1002     History   Chief Complaint Chief Complaint  Patient presents with  . Shortness of Breath    HPI CASTEN FLOREN is a 68 y.o. male.  HPI   68 year old male with a history of diabetes, hypertension, hyperlipidemia, peripheral vascular disease, CVA on MRI, history of smoking with patient denying a history of COPD, however has had admission for suspected COPD exacerbation and pneumonia in December 2019, who presents with concern for cough, shortness of breath and wheezing.  Reports he is had symptoms for approximately 3 days and they have been worsening.  Reports this feels the same as when he had pneumonia.  Has had subjective fever, chills.  Cough has been productive of clear sputum.  Reports associated wheezing, but reports he has no history of asthma or COPD and no breathing treatments or albuterol at home.  He was wearing oxygen on EMS arrival, which had been left over from a deceased relative.  He denies chest pain, nausea, vomiting, body aches.  Reports occasional orthopnea.  Denies any significant leg swelling or pain.  Past Medical History:  Diagnosis Date  . Anxiety   . Arthritis    back, right hip  . Chronic kidney disease    stage 3  . Diabetes mellitus without complication ([place])   . Dyspnea   . Heart murmur   . Hyperlipidemia   . Hypertension   . Neuropathy   . Peripheral vascular disease ([place])    Carotid artery stenosis, s/p bilat stents  . Skin cancer    basal cell right cheek, left ear  . Stroke [facility])    noted on MRI    Patient Active Problem List   Diagnosis Date Noted  . Acute on chronic respiratory failure with hypoxia ([place]) 12/21/2017  . CAP (community acquired pneumonia) 12/21/2017  . AKI (acute kidney injury) ([place]) 12/21/2017  . COPD exacerbation ([place]) 12/20/2017  . Heart failure ([place]) 12/20/2017  . Carotid artery stenosis,  symptomatic, left 02/21/2017    Past Surgical History:  Procedure Laterality Date  . ENDARTERECTOMY Left 02/21/2017   Procedure: LEFT CAROTID ENDARTERECTOMY;  Surgeon: Rosetta Posner, MD;  Location: [facility] OR;  Service: Vascular;  Laterality: Left;  . lower extremity stenting    . PATCH ANGIOPLASTY Left 02/21/2017   Procedure: PATCH ANGIOPLASTY OF LEFT CAROTID ARTERY USING HEMASHIELD PLATINUM FINESSE PATCH;  Surgeon: Rosetta Posner, MD;  Location: [facility] OR;  Service: Vascular;  Laterality: Left;        Home Medications    Prior to Admission medications   Medication Sig Start Date End Date Taking? Authorizing Provider  aspirin EC 81 MG tablet Take 81 mg by mouth daily.   Yes [provider]  aspirin-acetaminophen-caffeine (EXCEDRIN MIGRAINE) [phone] MG tablet Take by mouth every 6 (six) hours as needed for headache.   Yes [provider]  atenolol (TENORMIN) 50 MG tablet Take 50 mg by mouth 2 (two) times daily.   Yes [provider]  atorvastatin (LIPITOR) 40 MG tablet Take 20 mg by mouth at bedtime.    Yes [provider]  gabapentin (NEURONTIN) 300 MG capsule Take 300 mg by mouth at bedtime.    Yes [provider]  glipiZIDE (GLUCOTROL) 10 MG tablet Take 10 mg by mouth 2 (two) times daily. 12/28/16  Yes [provider]  pioglitazone (ACTOS) 15 MG tablet Take 15 mg by  mouth daily. 12/25/17  Yes [provider]  sertraline (ZOLOFT) 50 MG tablet Take 50 mg by mouth daily.   Yes [provider]  traMADol (ULTRAM) 50 MG tablet Take 1 tablet (50 mg total) by mouth 3 (three) times daily as needed for moderate pain (arthritis). 02/21/17  Yes Rhyne, Hulen Shouts, PA-C  traZODone (DESYREL) 50 MG tablet Take 50 mg by mouth at bedtime. 01/29/18  Yes [provider]  azithromycin (ZITHROMAX) 250 MG tablet Take one tab daily Patient not taking: Reported on 02/13/2018 12/23/17   Charlynne Cousins, MD  predniSONE (DELTASONE) 10 MG  tablet Takes 6 tablets for 2 days, then 5 tablets for 2 days, then 4 tablets for 2 days, then 3 tablets for 2 days, then 2 tabs for 2 days, then 1 tab for 2 days, and then stop. Patient not taking: Reported on 02/13/2018 12/23/17   Charlynne Cousins, MD    Family History No family history on file.  Social History Social History   Tobacco Use  . Smoking status: Current Every Day Smoker    Years: 20.00    Types: Cigarettes  . Smokeless tobacco: Never Used  . Tobacco comment: Less than 1/2 pk per day  Substance Use Topics  . Alcohol use: No    Comment: heavy drinker in past, none for 20 years  . Drug use: No     Allergies   Clindamycin/lincomycin; Contrast media [iodinated diagnostic agents]; and Metformin and related   Review of Systems Review of Systems  Constitutional: Positive for appetite change, chills and fever.  HENT: Negative for sore throat.   Eyes: Negative for visual disturbance.  Respiratory: Positive for cough and wheezing. Negative for shortness of breath.   Cardiovascular: Negative for chest pain and leg swelling.  Gastrointestinal: Negative for abdominal pain, nausea and vomiting.  Genitourinary: Negative for difficulty urinating.  Musculoskeletal: Negative for back pain and neck stiffness.  Skin: Negative for rash.  Neurological: Negative for syncope and headaches.     Physical Exam Updated Vital Signs BP 91/62   Pulse (!) 110   Temp 98.2 F (36.8 C) (Oral)   Resp (!) 29   Ht 5\' 9"  (1.753 m)   Wt 71.2 kg   SpO2 98%   BMI 23.18 kg/m   Physical Exam Vitals signs and nursing note reviewed.  Constitutional:      General: He is not in acute distress.    Appearance: He is well-developed and underweight. He is ill-appearing. He is not diaphoretic.  HENT:     Head: Normocephalic and atraumatic.  Eyes:     Conjunctiva/sclera: Conjunctivae normal.  Neck:     Musculoskeletal: Normal range of motion.  Cardiovascular:     Rate and Rhythm: Normal  rate and regular rhythm.     Heart sounds: Normal heart sounds. No murmur. No friction rub. No gallop.   Pulmonary:     Effort: Pulmonary effort is normal. Tachypnea present. No respiratory distress.     Breath sounds: Wheezing present. No rales.  Abdominal:     General: There is no distension.     Palpations: Abdomen is soft.     Tenderness: There is no abdominal tenderness. There is no guarding.     Comments: Hernia easily reducible   Skin:    General: Skin is warm and dry.  Neurological:     Mental Status: He is alert and oriented to person, place, and time.      ED Treatments / Results  Labs (all labs ordered are listed, but only abnormal results are displayed) Labs Reviewed  CBC WITH DIFFERENTIAL/PLATELET - Abnormal; Notable for the following components:      Result Value   RBC 3.81 (*)    MCV 104.5 (*)    MCH 34.9 (*)    Platelets 129 (*)    All other components within normal limits  COMPREHENSIVE METABOLIC PANEL - Abnormal; Notable for the following components:   Sodium 133 (*)    Chloride 93 (*)    Glucose, Bld 272 (*)    Total Bilirubin 1.6 (*)    Anion gap 16 (*)    All other components within normal limits  BRAIN NATRIURETIC PEPTIDE - Abnormal; Notable for the following components:   B Natriuretic Peptide 358.9 (*)    All other components within normal limits  D-DIMER, QUANTITATIVE (NOT AT [facility]) - Abnormal; Notable for the following components:   D-Dimer, Quant 1.32 (*)    All other components within normal limits  CBG MONITORING, ED - Abnormal; Notable for the following components:   Glucose-Capillary 243 (*)    All other components within normal limits  LACTIC ACID, PLASMA  LACTIC ACID, PLASMA  I-STAT TROPONIN, ED    EKG None  Radiology Dg Chest 2 View  Result Date: 02/13/2018 CLINICAL DATA:  Cough and chest congestion over the last week. EXAM: CHEST - 2 VIEW COMPARISON:  12/20/2017 FINDINGS: Heart size is normal. Chronic aortic atherosclerosis.  There may be central bronchial thickening but there is no infiltrate, collapse or effusion. Ordinary degenerative changes affect the spine. IMPRESSION: Possible bronchitis. No consolidation or collapse. Electronically Signed   By: Nelson Chimes M.D.   On: 02/13/2018 11:05    Procedures Procedures (including critical care time)  Medications Ordered in ED Medications  sodium chloride (PF) 0.9 % injection (0 mLs  Hold 02/13/18 1357)  iopamidol (ISOVUE-370) 76 % injection (  Hold 02/13/18 1357)  iopamidol (ISOVUE-370) 76 % injection 100 mL (has no administration in time range)  ipratropium-albuterol (DUONEB) 0.5-2.5 (3) MG/3ML nebulizer solution 3 mL (3 mLs Nebulization Given 02/13/18 1100)     Initial Impression / Assessment and Plan / ED Course  I have reviewed the triage vital signs and the nursing notes.  Pertinent labs & imaging results that were available during my care of the patient were reviewed by me and considered in my medical decision making (see chart for details).     68 year old male with a history of diabetes, hypertension, hyperlipidemia, peripheral vascular disease, CVA on MRI, history of smoking with patient denying a history of COPD, however has had admission for suspected COPD exacerbation and pneumonia in December 2019, who presents with concern for cough, shortness of breath and wheezing.   Received duo nebs and Solu-Medrol with EMS.  Presents with diffuse wheezing on exam.  Differential diagnosis includes pneumonia, COPD exacerbation, CHF exacerbation, pulmonary embolus, pneumothorax, anemia.  Chest x-ray was obtained and shows bronchitis, no sign of pneumonia. Labs show no leukocytosis, normal troponin, BNP 358. Given continuing tachycardia, hypoxia, ddimer done and 1.32. Ordered CT angio however when patient got to CT reports he has had dyspnea with contrast in the past. Ordered VQ scan. O2 sats to 90% off of O2 and HR and tachypnea increasing so was placed back on by  nursing.   Will admit for further care.  Suspect likely COPD exacerbation given wheezing, (hx of smoking, hx of this on prior admission although pt denies), however in setting of mildly elevated  BNP consider CHF/PE, and PE as contributors.  Final Clinical Impressions(s) / ED Diagnoses   Final diagnoses:  Acute respiratory distress  COPD exacerbation ([place])  Elevated d-dimer    ED Discharge Orders    None       Gareth Morgan, MD 02/13/18 1437

## 2018-02-13 NOTE — ED Notes (Signed)
Patient Transported to Nuclear Medicine for Imaging.

## 2018-02-13 NOTE — ED Notes (Signed)
ED TO INPATIENT HANDOFF REPORT  Name/Age/Gender Adrian Andrews 68 y.o. male  Code Status Code Status History    Date Active Date Inactive Code Status Order ID Comments User Context   12/20/2017 1608 12/23/2017 1737 Full Code [id number]  Tawni Millers, MD Inpatient   02/21/2017 1701 02/22/2017 1449 Full Code [id number]  Gabriel Earing, PA-C Inpatient      Home/SNF/Other Home  Chief Complaint shob  Level of Care/Admitting Diagnosis ED Disposition    ED Disposition Condition Comment   Admit  Hospital Area: [facility] [[id number]]  Level of Care: Med-Surg [16]  Diagnosis: COPD (chronic obstructive pulmonary disease) [facility]) [[id number]]  Admitting Physician: Elmarie Shiley [id number]  Attending Physician: Niel Hummer A [[id number]]  PT Class (Do Not Modify): Observation [104]  PT Acc Code (Do Not Modify): Observation [10022]       Medical History Past Medical History:  Diagnosis Date  . Anxiety   . Arthritis    back, right hip  . Chronic kidney disease    stage 3  . Diabetes mellitus without complication ([place])   . Dyspnea   . Heart murmur   . Hyperlipidemia   . Hypertension   . Neuropathy   . Peripheral vascular disease ([place])    Carotid artery stenosis, s/p bilat stents  . Skin cancer    basal cell right cheek, left ear  . Stroke [facility])    noted on MRI    Allergies Allergies  Allergen Reactions  . Clindamycin/Lincomycin     Throat tightens up  . Contrast Media [Iodinated Diagnostic Agents] Shortness Of Breath    11/20/2004 reaction with CTA CAP  . Metformin And Related Other (See Comments)    Effected kidney function    IV Location/Drains/Wounds Patient Lines/Drains/Airways Status   Active Line/Drains/Airways    Name:   Placement date:   Placement time:   Site:   Days:   Peripheral IV 02/13/18 Left Antecubital   02/13/18    -    Antecubital   less than 1          Labs/Imaging Results for orders placed or performed during the  hospital encounter of 02/13/18 (from the past 48 hour(s))  CBC with Differential     Status: Abnormal   Collection Time: 02/13/18 10:20 AM  Result Value Ref Range   WBC 6.9 4.0 - 10.5 K/uL   RBC 3.81 (L) 4.22 - 5.81 MIL/uL   Hemoglobin 13.3 13.0 - 17.0 g/dL   HCT 39.8 39.0 - 52.0 %   MCV 104.5 (H) 80.0 - 100.0 fL   MCH 34.9 (H) 26.0 - 34.0 pg   MCHC 33.4 30.0 - 36.0 g/dL    Comment: CORRECTED FOR INTERFERING SUBSTANCE PREWARMING TECHNIQUE USED    RDW 15.4 11.5 - 15.5 %   Platelets 129 (L) 150 - 400 K/uL    Comment: Immature Platelet Fraction may be clinically indicated, consider ordering this additional test [id number]    nRBC 0.0 0.0 - 0.2 %   Neutrophils Relative % 82 %   Neutro Abs 5.7 1.7 - 7.7 K/uL   Lymphocytes Relative 11 %   Lymphs Abs 0.8 0.7 - 4.0 K/uL   Monocytes Relative 6 %   Monocytes Absolute 0.4 0.1 - 1.0 K/uL   Eosinophils Relative 0 %   Eosinophils Absolute 0.0 0.0 - 0.5 K/uL   Basophils Relative 0 %   Basophils Absolute 0.0 0.0 - 0.1 K/uL   Immature Granulocytes 1 %  Abs Immature Granulocytes 0.05 0.00 - 0.07 K/uL    Comment: Performed at [facility], [place] [address], [place], [place] [address]  Comprehensive metabolic panel     Status: Abnormal   Collection Time: 02/13/18 10:20 AM  Result Value Ref Range   Sodium 133 (L) 135 - 145 mmol/L   Potassium 3.7 3.5 - 5.1 mmol/L   Chloride 93 (L) 98 - 111 mmol/L   CO2 24 22 - 32 mmol/L   Glucose, Bld 272 (H) 70 - 99 mg/dL   BUN 21 8 - 23 mg/dL   Creatinine, Ser 1.20 0.61 - 1.24 mg/dL   Calcium 9.5 8.9 - 10.3 mg/dL   Total Protein 7.4 6.5 - 8.1 g/dL   Albumin 4.4 3.5 - 5.0 g/dL   AST 23 15 - 41 U/L   ALT 19 0 - 44 U/L   Alkaline Phosphatase 78 38 - 126 U/L   Total Bilirubin 1.6 (H) 0.3 - 1.2 mg/dL   GFR calc non Af Amer >60 >60 mL/min   GFR calc Af Amer >60 >60 mL/min   Anion gap 16 (H) 5 - 15    Comment: Performed at [facility], [place] [address], [place],  [place] [address]  Brain natriuretic peptide     Status: Abnormal   Collection Time: 02/13/18 10:20 AM  Result Value Ref Range   B Natriuretic Peptide 358.9 (H) 0.0 - 100.0 pg/mL    Comment: Performed at [facility], [place] [address] [place], [place] [address]  D-dimer, quantitative (not at [facility])     Status: Abnormal   Collection Time: 02/13/18 10:20 AM  Result Value Ref Range   D-Dimer, Quant 1.32 (H) 0.00 - 0.50 ug/mL-FEU    Comment: (NOTE) At the manufacturer cut-off of 0.50 ug/mL FEU, this assay has been documented to exclude PE with a sensitivity and negative predictive value of 97 to 99%.  At this time, this assay has not been approved by the FDA to exclude DVT/VTE. Results should be correlated with clinical presentation. Performed at [facility], [place] [address], [place], [place] [address]   I-Stat Troponin, ED (not at [facility])     Status: None   Collection Time: 02/13/18 10:32 AM  Result Value Ref Range   Troponin i, poc 0.01 0.00 - 0.08 ng/mL   Comment 3            Comment: Due to the release kinetics of cTnI, a negative result within the first hours of the onset of symptoms does not rule out myocardial infarction with certainty. If myocardial infarction is still suspected, repeat the test at appropriate intervals.   CBG monitoring, ED     Status: Abnormal   Collection Time: 02/13/18 10:36 AM  Result Value Ref Range   Glucose-Capillary 243 (H) 70 - 99 mg/dL  Lactic acid, plasma     Status: Abnormal   Collection Time: 02/13/18  2:42 PM  Result Value Ref Range   Lactic Acid, Venous 5.6 (HH) 0.5 - 1.9 mmol/L    Comment: CRITICAL RESULT CALLED TO, READ BACK BY AND VERIFIED WITH: Alanson Aly. RN @1521  ON 01.31.2020 BY COHEN,K Performed at [facility], [place] [address], [place], [place] [address]    Dg Chest 2 View  Result Date: 02/13/2018 CLINICAL DATA:  Cough and chest congestion over the last week. EXAM: CHEST - 2 VIEW  COMPARISON:  12/20/2017 FINDINGS: Heart size is normal. Chronic aortic atherosclerosis. There may be central  bronchial thickening but there is no infiltrate, collapse or effusion. Ordinary degenerative changes affect the spine. IMPRESSION: Possible bronchitis. No consolidation or collapse. Electronically Signed   By: Nelson Chimes M.D.   On: 02/13/2018 11:05   None  Pending Labs Unresulted Labs (From admission, onward)    Start     Ordered   02/13/18 1457  Influenza panel by PCR (type A & B)  (Influenza PCR Panel)  Once,   R     02/13/18 1456   Signed and Held  Creatinine, serum  (enoxaparin (LOVENOX)    CrCl >/= 30 ml/min)  Weekly,   R    Comments:  while on enoxaparin therapy    Signed and Held          Vitals/Pain Today's Vitals   02/13/18 1330 02/13/18 1400 02/13/18 1430 02/13/18 1500  BP: 107/77 91/62 (!) 141/65 (!) 95/54  Pulse: (!) 111 (!) 110 (!) 116 (!) 116  Resp: 20 (!) 29 16 20   Temp:      TempSrc:      SpO2: (S) 99% 98% 98% 97%  Weight:      Height:        Isolation Precautions Droplet precaution  Medications Medications  sodium chloride (PF) 0.9 % injection (0 mLs  Hold 02/13/18 1357)  iopamidol (ISOVUE-370) 76 % injection (  Hold 02/13/18 1357)  iopamidol (ISOVUE-370) 76 % injection 100 mL (has no administration in time range)  guaiFENesin-codeine 100-10 MG/5ML solution 5 mL (5 mLs Oral Given 02/13/18 1517)  methylPREDNISolone sodium succinate (SOLU-MEDROL) 40 mg/mL injection 40 mg (40 mg Intravenous Given 02/13/18 1517)  insulin aspart (novoLOG) injection 0-9 Units (has no administration in time range)  ipratropium-albuterol (DUONEB) 0.5-2.5 (3) MG/3ML nebulizer solution 3 mL (0 mLs Nebulization Hold 02/13/18 1522)  technetium albumin aggregated (MAA) injection solution 4.4 millicurie (has no administration in time range)  ipratropium-albuterol (DUONEB) 0.5-2.5 (3) MG/3ML nebulizer solution 3 mL (3 mLs Nebulization Given 02/13/18 1100)  technetium TC 68M  diethylenetriame-pentaacetic acid (DTPA) injection 84.1 millicurie (32.4 millicuries Inhalation Given 02/13/18 1558)    Mobility walks

## 2018-02-13 NOTE — ED Notes (Signed)
Patient transported to CT 

## 2018-02-13 NOTE — H&P (Addendum)
History and Physical  JAYVAN MCSHAN GDJ:[id number] DOB: Sep 04, 1950 DOA: 02/13/2018  PCP: [name], MD Patient coming from: Home   I have personally briefly reviewed patient's old medical records in [place]   Chief Complaint: SOB, Cough   HPI: Adrian Andrews is a 68 y.o. male with past medical history significant for chronic kidney disease a stage III, diabetes, peripheral vascular disease status post bilateral stents, current smoker, possible history of COPD who presents complaining of shortness of breath, productive cough, chills that is started 2 days prior to admission.  He denies sick contact.  He is also complaining of chest pain after he cough. He denies leg pain, lower extremity edema.  Evaluation in the ED; sodium 133, potassium 3.7, chloride 93, troponin 0 0.01 BNP 358, hemoglobin 13, platelet 129.  D-dimer 1.3, glucose 227.  X-ray possible bronchitis, no infiltrates. CAT scan pending.  Lactic acid 5.6  Review of Systems: All systems reviewed and apart from history of presenting illness, are negative.  Past Medical History:  Diagnosis Date  . Anxiety   . Arthritis    back, right hip  . Chronic kidney disease    stage 3  . Diabetes mellitus without complication ([place])   . Dyspnea   . Heart murmur   . Hyperlipidemia   . Hypertension   . Neuropathy   . Peripheral vascular disease ([place])    Carotid artery stenosis, s/p bilat stents  . Skin cancer    basal cell right cheek, left ear  . Stroke [facility])    noted on MRI   Past Surgical History:  Procedure Laterality Date  . ENDARTERECTOMY Left 02/21/2017   Procedure: LEFT CAROTID ENDARTERECTOMY;  Surgeon: Rosetta Posner, MD;  Location: [facility] OR;  Service: Vascular;  Laterality: Left;  . lower extremity stenting    . PATCH ANGIOPLASTY Left 02/21/2017   Procedure: PATCH ANGIOPLASTY OF LEFT CAROTID ARTERY USING HEMASHIELD PLATINUM FINESSE PATCH;  Surgeon: Rosetta Posner, MD;  Location: [facility] OR;  Service: Vascular;   Laterality: Left;   Social History:  reports that he has been smoking cigarettes. He has smoked for the past 20.00 years. He has never used smokeless tobacco. He reports that he does not drink alcohol or use drugs.   Allergies  Allergen Reactions  . Clindamycin/Lincomycin     Throat tightens up  . Contrast Media [Iodinated Diagnostic Agents] Shortness Of Breath    11/20/2004 reaction with CTA CAP  . Metformin And Related Other (See Comments)    Effected kidney function   Family History;  Mother; died MI. Father MI. Sister; renal failure.    Prior to Admission medications   Medication Sig Start Date End Date Taking? Authorizing Provider  aspirin EC 81 MG tablet Take 81 mg by mouth daily.   Yes [provider]  aspirin-acetaminophen-caffeine (EXCEDRIN MIGRAINE) [phone] MG tablet Take by mouth every 6 (six) hours as needed for headache.   Yes [provider]  atenolol (TENORMIN) 50 MG tablet Take 50 mg by mouth 2 (two) times daily.   Yes [provider]  atorvastatin (LIPITOR) 40 MG tablet Take 20 mg by mouth at bedtime.    Yes [provider]  gabapentin (NEURONTIN) 300 MG capsule Take 300 mg by mouth at bedtime.    Yes [provider]  glipiZIDE (GLUCOTROL) 10 MG tablet Take 10 mg by mouth 2 (two) times daily. 12/28/16  Yes [provider]  pioglitazone (ACTOS) 15 MG tablet Take  15 mg by mouth daily. 12/25/17  Yes [provider]  sertraline (ZOLOFT) 50 MG tablet Take 50 mg by mouth daily.   Yes [provider]  traMADol (ULTRAM) 50 MG tablet Take 1 tablet (50 mg total) by mouth 3 (three) times daily as needed for moderate pain (arthritis). 02/21/17  Yes Rhyne, Hulen Shouts, PA-C  traZODone (DESYREL) 50 MG tablet Take 50 mg by mouth at bedtime. 01/29/18  Yes [provider]  azithromycin (ZITHROMAX) 250 MG tablet Take one tab daily Patient not taking: Reported on 02/13/2018 12/23/17   Charlynne Cousins, MD    predniSONE (DELTASONE) 10 MG tablet Takes 6 tablets for 2 days, then 5 tablets for 2 days, then 4 tablets for 2 days, then 3 tablets for 2 days, then 2 tabs for 2 days, then 1 tab for 2 days, and then stop. Patient not taking: Reported on 02/13/2018 12/23/17   Charlynne Cousins, MD   Physical Exam: Vitals:   02/13/18 1330 02/13/18 1400 02/13/18 1430 02/13/18 1500  BP: 107/77 91/62 (!) 141/65 (!) 95/54  Pulse: (!) 111 (!) 110 (!) 116 (!) 116  Resp: 20 (!) 29 16 20   Temp:      TempSrc:      SpO2: (S) 99% 98% 98% 97%  Weight:      Height:         General exam: Moderately built and nourished patient, lying comfortably supine on the gurney in no obvious distress.  Head, eyes and ENT: Nontraumatic and normocephalic. Pupils equally reacting to light and accommodation. Oral mucosa moist.  Neck: Supple. No JVD, carotid bruit or thyromegaly.  Lymphatics: No lymphadenopathy.  Respiratory system: Mildly increased work of breathing, bilateral wheezing, bilateral rhonchus.  Cardiovascular system: S1 and S2 heard, RRR. No JVD, murmurs, gallops, clicks or pedal edema.  Gastrointestinal system: Abdomen is nondistended, soft and nontender. Normal bowel sounds heard. No organomegaly or masses appreciated.  Central nervous system: Alert and oriented. No focal neurological deficits.  Extremities: Symmetric 5 x 5 power. Peripheral pulses symmetrically felt.   Skin: No rashes or acute findings.  Musculoskeletal system: Negative exam.  Psychiatry: Pleasant and cooperative.   Labs on Admission:  Basic Metabolic Panel: Recent Labs  Lab 02/13/18 1020  NA 133*  K 3.7  CL 93*  CO2 24  GLUCOSE 272*  BUN 21  CREATININE 1.20  CALCIUM 9.5   Liver Function Tests: Recent Labs  Lab 02/13/18 1020  AST 23  ALT 19  ALKPHOS 78  BILITOT 1.6*  PROT 7.4  ALBUMIN 4.4   No results for input(s): LIPASE, AMYLASE in the last 168 hours. No results for input(s): AMMONIA in the last 168  hours. CBC: Recent Labs  Lab 02/13/18 1020  WBC 6.9  NEUTROABS 5.7  HGB 13.3  HCT 39.8  MCV 104.5*  PLT 129*   Cardiac Enzymes: No results for input(s): CKTOTAL, CKMB, CKMBINDEX, TROPONINI in the last 168 hours.  BNP (last 3 results) No results for input(s): PROBNP in the last 8760 hours. CBG: Recent Labs  Lab 02/13/18 1036  GLUCAP 243*    Radiological Exams on Admission: Dg Chest 2 View  Result Date: 02/13/2018 CLINICAL DATA:  Cough and chest congestion over the last week. EXAM: CHEST - 2 VIEW COMPARISON:  12/20/2017 FINDINGS: Heart size is normal. Chronic aortic atherosclerosis. There may be central bronchial thickening but there is no infiltrate, collapse or effusion. Ordinary degenerative changes affect the spine. IMPRESSION: Possible bronchitis. No consolidation or collapse. Electronically  Signed   By: Nelson Chimes M.D.   On: 02/13/2018 11:05    EKG: Sinus tachycardia, PVC.  Assessment/Plan Active Problems:   COPD exacerbation (HCC)   COPD (chronic obstructive pulmonary disease) (HCC)   Lactic acidosis   1-Acute COPD exacerbation;  He is current smoker, present with dyspnea, wheezing. Need pulmonary function test. BNP mildly elevated but no peripheral edema.  Admit for to step down unit. Has also lactic acidosis.  IV solumedrol. IV antibiotics  ceftriaxone and azithro, Nebulizer treatments.  Influenza negative.   2-Lactic acidosis; IV bolus. IV fluids.  IV antibiotics.  Rule out infection. Check UA. Blood culture.  Cycle lactic acid.  Could be related to problem number one.    3-DM; continue with glipizide. SSI.   4-Tachycardia; check TSH.  5-Positive D dimer;  VQ scan ordered. Check doppler LE>      DVT Prophylaxis: Lovenox Code Status: Full code Family Communication: Care discussed with patient Disposition Plan: Admit observation for treatment of COPD exacerbation  Time spent: 75 minutes.   Elmarie Shiley MD [facility]   02/13/2018, 4:18 PM

## 2018-02-14 ENCOUNTER — Observation Stay (HOSPITAL_COMMUNITY): Payer: Medicare Other

## 2018-02-14 DIAGNOSIS — M4690 Unspecified inflammatory spondylopathy, site unspecified: Secondary | ICD-10-CM | POA: Diagnosis present

## 2018-02-14 DIAGNOSIS — Z881 Allergy status to other antibiotic agents status: Secondary | ICD-10-CM | POA: Diagnosis not present

## 2018-02-14 DIAGNOSIS — J9621 Acute and chronic respiratory failure with hypoxia: Secondary | ICD-10-CM | POA: Diagnosis present

## 2018-02-14 DIAGNOSIS — N183 Chronic kidney disease, stage 3 unspecified: Secondary | ICD-10-CM | POA: Diagnosis present

## 2018-02-14 DIAGNOSIS — M1611 Unilateral primary osteoarthritis, right hip: Secondary | ICD-10-CM | POA: Diagnosis present

## 2018-02-14 DIAGNOSIS — Z9981 Dependence on supplemental oxygen: Secondary | ICD-10-CM | POA: Diagnosis not present

## 2018-02-14 DIAGNOSIS — R7989 Other specified abnormal findings of blood chemistry: Secondary | ICD-10-CM

## 2018-02-14 DIAGNOSIS — Z7982 Long term (current) use of aspirin: Secondary | ICD-10-CM | POA: Diagnosis not present

## 2018-02-14 DIAGNOSIS — E1151 Type 2 diabetes mellitus with diabetic peripheral angiopathy without gangrene: Secondary | ICD-10-CM | POA: Diagnosis present

## 2018-02-14 DIAGNOSIS — R0603 Acute respiratory distress: Secondary | ICD-10-CM

## 2018-02-14 DIAGNOSIS — F17210 Nicotine dependence, cigarettes, uncomplicated: Secondary | ICD-10-CM | POA: Diagnosis present

## 2018-02-14 DIAGNOSIS — Z85828 Personal history of other malignant neoplasm of skin: Secondary | ICD-10-CM | POA: Diagnosis not present

## 2018-02-14 DIAGNOSIS — Z79899 Other long term (current) drug therapy: Secondary | ICD-10-CM | POA: Diagnosis not present

## 2018-02-14 DIAGNOSIS — J441 Chronic obstructive pulmonary disease with (acute) exacerbation: Secondary | ICD-10-CM | POA: Diagnosis present

## 2018-02-14 DIAGNOSIS — N182 Chronic kidney disease, stage 2 (mild): Secondary | ICD-10-CM

## 2018-02-14 DIAGNOSIS — Z91041 Radiographic dye allergy status: Secondary | ICD-10-CM | POA: Diagnosis not present

## 2018-02-14 DIAGNOSIS — E1122 Type 2 diabetes mellitus with diabetic chronic kidney disease: Secondary | ICD-10-CM | POA: Diagnosis present

## 2018-02-14 DIAGNOSIS — N189 Chronic kidney disease, unspecified: Secondary | ICD-10-CM

## 2018-02-14 DIAGNOSIS — E785 Hyperlipidemia, unspecified: Secondary | ICD-10-CM | POA: Diagnosis present

## 2018-02-14 DIAGNOSIS — Z8673 Personal history of transient ischemic attack (TIA), and cerebral infarction without residual deficits: Secondary | ICD-10-CM | POA: Diagnosis not present

## 2018-02-14 DIAGNOSIS — Z7984 Long term (current) use of oral hypoglycemic drugs: Secondary | ICD-10-CM | POA: Diagnosis not present

## 2018-02-14 DIAGNOSIS — Z888 Allergy status to other drugs, medicaments and biological substances status: Secondary | ICD-10-CM | POA: Diagnosis not present

## 2018-02-14 DIAGNOSIS — Z8701 Personal history of pneumonia (recurrent): Secondary | ICD-10-CM | POA: Diagnosis not present

## 2018-02-14 DIAGNOSIS — I129 Hypertensive chronic kidney disease with stage 1 through stage 4 chronic kidney disease, or unspecified chronic kidney disease: Secondary | ICD-10-CM | POA: Diagnosis present

## 2018-02-14 DIAGNOSIS — F17200 Nicotine dependence, unspecified, uncomplicated: Secondary | ICD-10-CM | POA: Diagnosis present

## 2018-02-14 DIAGNOSIS — Z9119 Patient's noncompliance with other medical treatment and regimen: Secondary | ICD-10-CM | POA: Diagnosis not present

## 2018-02-14 DIAGNOSIS — E1140 Type 2 diabetes mellitus with diabetic neuropathy, unspecified: Secondary | ICD-10-CM | POA: Diagnosis present

## 2018-02-14 DIAGNOSIS — E872 Acidosis: Secondary | ICD-10-CM | POA: Diagnosis present

## 2018-02-14 LAB — GLUCOSE, CAPILLARY
GLUCOSE-CAPILLARY: 320 mg/dL — AB (ref 70–99)
Glucose-Capillary: 249 mg/dL — ABNORMAL HIGH (ref 70–99)
Glucose-Capillary: 302 mg/dL — ABNORMAL HIGH (ref 70–99)
Glucose-Capillary: 235 mg/dL — ABNORMAL HIGH (ref 70–99)
Glucose-Capillary: 124 mg/dL — ABNORMAL HIGH (ref 70–99)

## 2018-02-14 LAB — CBC
HCT: 31.8 % — ABNORMAL LOW (ref 39.0–52.0)
Hemoglobin: 11.8 g/dL — ABNORMAL LOW (ref 13.0–17.0)
MCH: 39.6 pg — ABNORMAL HIGH (ref 26.0–34.0)
MCHC: 37.1 g/dL — ABNORMAL HIGH (ref 30.0–36.0)
MCV: 106.7 fL — ABNORMAL HIGH (ref 80.0–100.0)
Platelets: 121 10*3/uL — ABNORMAL LOW (ref 150–400)
RBC: 2.98 MIL/uL — ABNORMAL LOW (ref 4.22–5.81)
RDW: 16.3 % — ABNORMAL HIGH (ref 11.5–15.5)
WBC: 11.1 10*3/uL — ABNORMAL HIGH (ref 4.0–10.5)
nRBC: 0 % (ref 0.0–0.2)

## 2018-02-14 LAB — BASIC METABOLIC PANEL
Anion gap: 11 (ref 5–15)
BUN: 31 mg/dL — ABNORMAL HIGH (ref 8–23)
CO2: 23 mmol/L (ref 22–32)
Calcium: 8.8 mg/dL — ABNORMAL LOW (ref 8.9–10.3)
Chloride: 99 mmol/L (ref 98–111)
Creatinine, Ser: 1.32 mg/dL — ABNORMAL HIGH (ref 0.61–1.24)
GFR calc Af Amer: >60 mL/min (ref >60)
GFR calc non Af Amer: 55 mL/min — ABNORMAL LOW (ref >60)
Glucose, Bld: 307 mg/dL — ABNORMAL HIGH (ref 70–99)
Potassium: 5.1 mmol/L (ref 3.5–5.1)
SODIUM: 133 mmol/L — AB (ref 135–145)

## 2018-02-14 LAB — TSH: TSH: 0.34 u[IU]/mL — ABNORMAL LOW (ref 0.350–4.500)

## 2018-02-14 LAB — T4, FREE: FREE T4: 0.98 ng/dL (ref 0.82–1.77)

## 2018-02-14 LAB — LACTIC ACID, PLASMA: Lactic Acid, Venous: 2.8 mmol/L (ref 0.5–1.9)

## 2018-02-14 LAB — MAGNESIUM: MAGNESIUM: 1.9 mg/dL (ref 1.7–2.4)

## 2018-02-14 LAB — MRSA PCR SCREENING: MRSA BY PCR: NEGATIVE

## 2018-02-14 MED ORDER — INSULIN GLARGINE 100 UNIT/ML ~~LOC~~ SOLN
10.0000 [IU] | Freq: Every day | SUBCUTANEOUS | Status: DC
  Administered 2018-02-14 – 2018-02-16 (×3): 10 [IU] via SUBCUTANEOUS
  Filled 2018-02-14 (×3): qty 0.1

## 2018-02-14 MED ORDER — BENZONATATE 100 MG PO CAPS
200.0000 mg | ORAL_CAPSULE | Freq: Three times a day (TID) | ORAL | Status: DC | PRN
  Administered 2018-02-14 – 2018-02-15 (×2): 200 mg via ORAL
  Filled 2018-02-14 (×2): qty 2

## 2018-02-14 MED ORDER — INSULIN ASPART 100 UNIT/ML ~~LOC~~ SOLN
3.0000 [IU] | Freq: Three times a day (TID) | SUBCUTANEOUS | Status: DC
  Administered 2018-02-14 – 2018-02-16 (×5): 3 [IU] via SUBCUTANEOUS

## 2018-02-14 MED ORDER — IPRATROPIUM-ALBUTEROL 0.5-2.5 (3) MG/3ML IN SOLN
3.0000 mL | Freq: Three times a day (TID) | RESPIRATORY_TRACT | Status: DC
  Administered 2018-02-15 (×3): 3 mL via RESPIRATORY_TRACT
  Filled 2018-02-14 (×3): qty 3

## 2018-02-14 MED ORDER — ALUM & MAG HYDROXIDE-SIMETH 200-200-20 MG/5ML PO SUSP
30.0000 mL | ORAL | Status: DC | PRN
  Administered 2018-02-14 (×3): 30 mL via ORAL
  Filled 2018-02-14 (×3): qty 30

## 2018-02-14 MED ORDER — SODIUM CHLORIDE 0.9 % IV BOLUS
500.0000 mL | Freq: Once | INTRAVENOUS | Status: AC
  Administered 2018-02-14: 500 mL via INTRAVENOUS

## 2018-02-14 MED ORDER — INSULIN ASPART 100 UNIT/ML ~~LOC~~ SOLN
5.0000 [IU] | Freq: Once | SUBCUTANEOUS | Status: AC
  Administered 2018-02-14: 5 [IU] via SUBCUTANEOUS

## 2018-02-14 NOTE — Progress Notes (Signed)
CRITICAL VALUE ALERT  Critical Value:  Lat Acid 2.8  Date & Time Notied:  02/14/2018 2:15 AM   Provider Notified:  Bodenheimer NP  Orders Received/Actions taken: none at this time

## 2018-02-14 NOTE — Progress Notes (Signed)
[facility]                                                                              Patient Demographics  Adrian Andrews, is a 68 y.o. male, DOB - 1950/12/08, XBW:[id number]  Admit date - 02/13/2018   Admitting Physician Elmarie Shiley, MD  Outpatient Primary MD for the patient is [name], MD  Outpatient specialists:   LOS - 0  days   Medical records reviewed and are as summarized below:    Chief Complaint  Patient presents with  . Shortness of Breath       Brief summary   Patient is a 68 year old male with CKD stage II, diabetes, disease, smoker, history of COPD presented with shortness of breath, productive cough, chills for 2 days.   D-dimer 1.3, chest x-ray showed bronchitis VQ scan with low probability of PE  Assessment & Plan    Principal Problem:   Acute on chronic respiratory failure with hypoxia (HCC) likely has underlying COPD, now COPD exacerbation -At the time of admission, patient reported difficulty breathing, productive cough with chills for last 2 days, chest x-ray with bronchitis, hypoxic, patient was placed on 4 L O2 -Noncompliant with home O2 at home, O2 sats currently 98% on 3 L, wean as tolerated -Still wheezing bilaterally, continue scheduled nebs, IV Solu-Medrol, IV antibiotics, flutter valve -Continue IV Zithromax, Rocephin -Counseled strongly on nicotine cessation -VQ scan negative for PE, had 2D echo in 12/2017 showed EF of 60 to 65% with no regional wall motion abnormalities. -Elevated d-dimer, Doppler ultrasound negative for DVT  Active Problems:   Lactic acidosis -Improving    Type 2 diabetes mellitus with other specified complication ([place]), PVD, chronic kidney disease -Continue sliding scale insulin -CBGs uncontrolled secondary to steroids -Add Lantus 10 units daily, NovoLog sliding scale insulin 3 units 3 times daily AC, - follow hemoglobin A1c -Hold oral hypoglycemics     COPD with acute exacerbation  ([place]) -Will likely need outpatient PFTs once improved    Nicotine use disorder -Counseled strongly on nicotine cessation, placed on nicotine patch    Chronic kidney disease (CKD) stage II -Baseline creatinine 1.2-1.3, currently at baseline    Code Status: Full CODE STATUS DVT Prophylaxis:   SCD's Family Communication: Discussed in detail with the patient, all imaging results, lab results explained to the patient    Disposition Plan: Transfer to the floor  Time Spent in minutes 25 minutes  Procedures:  VQ scan  Consultants:   None  Antimicrobials:   Anti-infectives (From admission, onward)   Start     Dose/Rate Route Frequency Ordered Stop   02/13/18 1630  cefTRIAXone (ROCEPHIN) 1 g in sodium chloride 0.9 % 100 mL IVPB     1 g 200 mL/hr over 30 Minutes Intravenous Every 24 hours 02/13/18 1613     02/13/18 1615  azithromycin (ZITHROMAX) 500 mg in sodium chloride 0.9 % 250 mL IVPB     500 mg 250 mL/hr over 60 Minutes Intravenous Every 24 hours 02/13/18 1613           Medications  Scheduled Meds: . aspirin EC  81 mg  Oral Daily  . atenolol  50 mg Oral BID  . atorvastatin  20 mg Oral QHS  . enoxaparin (LOVENOX) injection  40 mg Subcutaneous Q24H  . gabapentin  300 mg Oral QHS  . glipiZIDE  10 mg Oral BID AC  . guaiFENesin  600 mg Oral BID  . insulin aspart  0-9 Units Subcutaneous TID WC  . ipratropium-albuterol  3 mL Nebulization Q6H  . methylPREDNISolone (SOLU-MEDROL) injection  40 mg Intravenous Q8H  . sertraline  50 mg Oral Daily  . sodium chloride flush  3 mL Intravenous Q12H  . traZODone  50 mg Oral QHS   Continuous Infusions: . sodium chloride    . azithromycin Stopped (02/13/18 1838)  . cefTRIAXone (ROCEPHIN)  IV Stopped (02/13/18 1837)   PRN Meds:.sodium chloride, acetaminophen **OR** acetaminophen, alum & mag hydroxide-simeth, aspirin-acetaminophen-caffeine, benzonatate, guaiFENesin-codeine, menthol-cetylpyridinium, ondansetron **OR** ondansetron  (ZOFRAN) IV, sodium chloride flush, traMADol      Subjective:   Adrian Andrews was seen and examined today.  Still wheezing bilaterally, no chest pain.  No fevers or chills. Patient denies dizziness,  abdominal pain, N/V/D/C, new weakness, numbess, tingling.   Objective:   Vitals:   02/14/18 0600 02/14/18 0700 02/14/18 0800 02/14/18 0857  BP: (!) 137/56 (!) 164/72 (!) 143/65   Pulse:  83 78   Resp:  (!) 9 13   Temp:    98 F (36.7 C)  TempSrc:    Oral  SpO2:  99% 98% 98%  Weight:      Height:        Intake/Output Summary (Last 24 hours) at 02/14/2018 1034 Last data filed at 02/13/2018 2028 Gross per 24 hour  Intake 1850 ml  Output -  Net 1850 ml     Wt Readings from Last 3 Encounters:  02/14/18 67.9 kg  12/23/17 67.4 kg  04/01/17 71.3 kg     Exam  General: Alert and oriented x 3, NAD  Eyes:   HEENT:  Atraumatic, normocephalic, normal oropharynx  Cardiovascular: S1 S2 auscultated, Regular rate and rhythm.  Respiratory: Bilateral expiratory wheezing  Gastrointestinal: Soft, nontender, nondistended, + bowel sounds  Ext: no pedal edema bilaterally  Neuro: No new deficits  Musculoskeletal: No digital cyanosis, clubbing  Skin: No rashes  Psych: Normal affect and demeanor, alert and oriented x3    Data Reviewed:  I have personally reviewed following labs and imaging studies  Micro Results Recent Results (from the past 240 hour(s))  Culture, blood (routine x 2)     Status: None (Preliminary result)   Collection Time: 02/13/18  4:51 PM  Result Value Ref Range Status   Specimen Description BLOOD LEFT WRIST  Final   Special Requests   Final    BOTTLES DRAWN AEROBIC AND ANAEROBIC Blood Culture adequate volume Performed at [facility] [address] [place], [place] [address]    Culture NO GROWTH < 12 HOURS  Final   Report Status PENDING  Incomplete  Culture, blood (routine x 2)     Status: None (Preliminary result)   Collection  Time: 02/13/18  5:05 PM  Result Value Ref Range Status   Specimen Description BLOOD RIGHT ANTECUBITAL  Final   Special Requests   Final    BOTTLES DRAWN AEROBIC AND ANAEROBIC Blood Culture adequate volume Performed at [facility] [address], [place], [place] [address]    Culture NO GROWTH < 12 HOURS  Final   Report Status PENDING  Incomplete  MRSA PCR Screening  Status: None   Collection Time: 02/13/18 11:39 PM  Result Value Ref Range Status   MRSA by PCR NEGATIVE NEGATIVE Final    Comment:        The GeneXpert MRSA Assay (FDA approved for NASAL specimens only), is one component of a comprehensive MRSA colonization surveillance program. It is not intended to diagnose MRSA infection nor to guide or monitor treatment for MRSA infections. Performed at [facility], [place] [address], [place], [place] [address]     Radiology Reports Dg Chest 2 View  Result Date: 02/13/2018 CLINICAL DATA:  Cough and chest congestion over the last week. EXAM: CHEST - 2 VIEW COMPARISON:  12/20/2017 FINDINGS: Heart size is normal. Chronic aortic atherosclerosis. There may be central bronchial thickening but there is no infiltrate, collapse or effusion. Ordinary degenerative changes affect the spine. IMPRESSION: Possible bronchitis. No consolidation or collapse. Electronically Signed   By: Nelson Chimes M.D.   On: 02/13/2018 11:05   Nm Pulmonary Vent And Perf (v/q Scan)  Result Date: 02/13/2018 CLINICAL DATA:  Shortness of breath for 2 days EXAM: NUCLEAR MEDICINE VENTILATION - PERFUSION LUNG SCAN TECHNIQUE: Ventilation images were obtained in multiple projections using inhaled aerosol Tc-31m DTPA. Perfusion images were obtained in multiple projections after intravenous injection of Tc-78m MAA. RADIOPHARMACEUTICALS:  31.5 mCi of Tc-63m DTPA aerosol inhalation and 4.4 mCi Tc64m MAA IV COMPARISON:  None Correlation: Chest radiograph 02/13/2018 FINDINGS: Ventilation:  Central airway deposition of aerosol. Marked patchy peripheral ventilation throughout both lungs, pattern most consistent with parenchymal lung disease/COPD. Multiple subsegmental perfusion defects are identified. Large subsegmental defects in the upper lobes bilaterally. Diminished ventilation at lateral LEFT lung base. Perfusion: Large subsegmental perfusion defect in lateral RIGHT upper lobe corresponding to a matching ventilatory finding. Matching diminished perfusion at lateral LEFT lung base. Inhomogeneous perfusion throughout the remaining lungs without additional segmental or subsegmental perfusion defects. Chest radiograph: Bronchitic and probable emphysematous changes. IMPRESSION: Severely patchy impaired ventilation throughout both lungs consistent with parenchymal lung disease/COPD. Large matching ventilation and perfusion defects in lateral RIGHT upper lobe with matching small ventilation perfusion defects at lateral LEFT lower lobe. Findings represent a low probability for pulmonary embolism. Electronically Signed   By: Lavonia Dana M.D.   On: 02/13/2018 16:56   Vas [place] Lower Extremity Venous (dvt)  Result Date: 02/14/2018  Lower Venous Study Indications: Edema.  Performing Technologist: Oliver Hum, J  Examination Guidelines: A complete evaluation includes B-mode imaging, spectral Doppler, color Doppler, and power Doppler as needed of all accessible portions of each vessel. Bilateral testing is considered an integral part of a complete examination. Limited examinations for reoccurring indications may be performed as noted.  Right Venous Findings: +---------+---------------+---------+-----------+----------+-------+          CompressibilityPhasicitySpontaneityPropertiesSummary +---------+---------------+---------+-----------+----------+-------+ CFV      Full           Yes      Yes                          +---------+---------------+---------+-----------+----------+-------+ SFJ       Full                                                 +---------+---------------+---------+-----------+----------+-------+ FV Prox  Full                                                 +---------+---------------+---------+-----------+----------+-------+  FV Mid   Full                                                 +---------+---------------+---------+-----------+----------+-------+ FV DistalFull                                                 +---------+---------------+---------+-----------+----------+-------+ PFV      Full                                                 +---------+---------------+---------+-----------+----------+-------+ POP      Full           Yes      Yes                          +---------+---------------+---------+-----------+----------+-------+ PTV      Full                                                 +---------+---------------+---------+-----------+----------+-------+ PERO     Full                                                 +---------+---------------+---------+-----------+----------+-------+  Left Venous Findings: +---------+---------------+---------+-----------+----------+-------+          CompressibilityPhasicitySpontaneityPropertiesSummary +---------+---------------+---------+-----------+----------+-------+ CFV      Full           Yes      Yes                          +---------+---------------+---------+-----------+----------+-------+ SFJ      Full                                                 +---------+---------------+---------+-----------+----------+-------+ FV Prox  Full                                                 +---------+---------------+---------+-----------+----------+-------+ FV Mid   Full                                                 +---------+---------------+---------+-----------+----------+-------+ FV DistalFull                                                  +---------+---------------+---------+-----------+----------+-------+  PFV      Full                                                 +---------+---------------+---------+-----------+----------+-------+ POP      Full           Yes      Yes                          +---------+---------------+---------+-----------+----------+-------+ PTV      Full                                                 +---------+---------------+---------+-----------+----------+-------+ PERO     Full                                                 +---------+---------------+---------+-----------+----------+-------+    Summary: Right: There is no evidence of deep vein thrombosis in the lower extremity. No cystic structure found in the popliteal fossa. Left: There is no evidence of deep vein thrombosis in the lower extremity. No cystic structure found in the popliteal fossa.  *See table(s) above for measurements and observations.    Preliminary     Lab Data:  CBC: Recent Labs  Lab 02/13/18 1020 02/14/18 0655  WBC 6.9 11.1*  NEUTROABS 5.7  --   HGB 13.3 11.8*  HCT 39.8 31.8*  MCV 104.5* 106.7*  PLT 129* 161*   Basic Metabolic Panel: Recent Labs  Lab 02/13/18 1020 02/14/18 0655  NA 133* 133*  K 3.7 5.1  CL 93* 99  CO2 24 23  GLUCOSE 272* 307*  BUN 21 31*  CREATININE 1.20 1.32*  CALCIUM 9.5 8.8*  MG  --  1.9   GFR: Estimated Creatinine Clearance: 50.7 mL/min (A) (by C-G formula based on SCr of 1.32 mg/dL (H)). Liver Function Tests: Recent Labs  Lab 02/13/18 1020  AST 23  ALT 19  ALKPHOS 78  BILITOT 1.6*  PROT 7.4  ALBUMIN 4.4   No results for input(s): LIPASE, AMYLASE in the last 168 hours. No results for input(s): AMMONIA in the last 168 hours. Coagulation Profile: No results for input(s): INR, PROTIME in the last 168 hours. Cardiac Enzymes: No results for input(s): CKTOTAL, CKMB, CKMBINDEX, TROPONINI in the last 168 hours. BNP (last 3 results) No results for input(s):  PROBNP in the last 8760 hours. HbA1C: No results for input(s): HGBA1C in the last 72 hours. CBG: Recent Labs  Lab 02/13/18 1036 02/13/18 1725 02/13/18 2201 02/13/18 2322 02/14/18 0807  GLUCAP 243* 417* 240* 249* 302*   Lipid Profile: No results for input(s): CHOL, HDL, LDLCALC, TRIG, CHOLHDL, LDLDIRECT in the last 72 hours. Thyroid Function Tests: Recent Labs    02/14/18 0308  TSH 0.340*   Anemia Panel: No results for input(s): VITAMINB12, FOLATE, FERRITIN, TIBC, IRON, RETICCTPCT in the last 72 hours. Urine analysis:    Component Value Date/Time   COLORURINE YELLOW 02/13/2018 1735   APPEARANCEUR CLEAR 02/13/2018 1735   LABSPEC 1.020 02/13/2018 1735   PHURINE 6.0 02/13/2018 1735   GLUCOSEU >=500 (  A) 02/13/2018 1735   HGBUR MODERATE (A) 02/13/2018 1735   BILIRUBINUR NEGATIVE 02/13/2018 1735   BILIRUBINUR neg 09/10/2013 1308   KETONESUR 5 (A) 02/13/2018 1735   PROTEINUR 100 (A) 02/13/2018 1735   UROBILINOGEN 0.2 09/10/2013 1308   NITRITE NEGATIVE 02/13/2018 1735   LEUKOCYTESUR NEGATIVE 02/13/2018 1735       M.D. [facility] 02/14/2018, 10:34 AM  Pager: [phone] Between 7am to 7pm - call Pager - [phone]  After 7pm go to [email or web address] - password [email or web address]  Call night coverage person covering after 7pm

## 2018-02-14 NOTE — Progress Notes (Signed)
Bilateral lower extremity venous duplex has been completed. Preliminary results can be found in CV Proc through chart review.   02/14/18 9:44 AM Adrian Andrews RVT

## 2018-02-15 LAB — T3: T3, Total: 77 ng/dL (ref 71–180)

## 2018-02-15 LAB — GLUCOSE, CAPILLARY
Glucose-Capillary: 191 mg/dL — ABNORMAL HIGH (ref 70–99)
Glucose-Capillary: 245 mg/dL — ABNORMAL HIGH (ref 70–99)
Glucose-Capillary: 173 mg/dL — ABNORMAL HIGH (ref 70–99)

## 2018-02-15 LAB — HEMOGLOBIN A1C
HEMOGLOBIN A1C: 7.4 % — AB (ref 4.8–5.6)
Mean Plasma Glucose: 165.68 mg/dL

## 2018-02-15 MED ORDER — METHYLPREDNISOLONE SODIUM SUCC 40 MG IJ SOLR
40.0000 mg | Freq: Three times a day (TID) | INTRAMUSCULAR | Status: AC
  Administered 2018-02-15 (×2): 40 mg via INTRAVENOUS
  Filled 2018-02-15 (×2): qty 1

## 2018-02-15 MED ORDER — PREDNISONE 20 MG PO TABS
40.0000 mg | ORAL_TABLET | Freq: Every day | ORAL | Status: DC
  Administered 2018-02-16: 40 mg via ORAL
  Filled 2018-02-15: qty 2

## 2018-02-15 NOTE — Progress Notes (Signed)
[facility]                                                                              Patient Demographics  Adrian Andrews, is a 68 y.o. male, DOB - [birth year], IWL:[id number]  Admit date - 02/13/2018   Admitting Physician Elmarie Shiley, MD  Outpatient Primary MD for the patient is [name], MD  Outpatient specialists:   LOS - 1  days   Medical records reviewed and are as summarized below:    Chief Complaint  Patient presents with  . Shortness of Breath       Brief summary   Patient is a 68 year old male with CKD stage II, diabetes, disease, smoker, history of COPD presented with shortness of breath, productive cough, chills for 2 days.   D-dimer 1.3, chest x-ray showed bronchitis VQ scan with low probability of PE  Assessment & Plan    Principal Problem:   Acute on chronic respiratory failure with hypoxia (HCC) likely has underlying COPD, now COPD exacerbation -At the time of admission, patient reported difficulty breathing, productive cough with chills for last 2 days, chest x-ray with bronchitis, hypoxic, patient was placed on 4 L O2 -Noncompliant with home O2 at home, O2 sats currently 98% on 3 L, wean as tolerated -Counseled strongly on nicotine cessation -VQ scan negative for PE, had 2D echo in 12/2017 showed EF of 60 to 65% with no regional wall motion abnormalities. -Elevated d-dimer, Doppler ultrasound negative for DVT -O2 sats 100% on 3 L, wean as tolerated, home O2 evaluation at the time of discharge -Wheezing improving, taper IV Solu-Medrol, continue scheduled nebs, antibiotics, flutter valve.-Continue IV Zithromax and Rocephin   Active Problems:   Lactic acidosis -Improving    Type 2 diabetes mellitus with other specified complication ([place]), PVD, chronic kidney disease --CBGs uncontrolled secondary to steroids, will taper Solu-Medrol, transition to oral prednisone in a.m. -Add Lantus 10 units daily, NovoLog sliding scale  insulin 3 units 3 times daily AC, -Hemoglobin A1c 7.4     COPD with acute exacerbation (HCC) -Will likely need outpatient PFTs once improved    Nicotine use disorder -Counseled strongly on nicotine cessation, placed on nicotine patch    Chronic kidney disease (CKD) stage II -Baseline creatinine 1.2-1.3, currently at baseline Creatinine currently at baseline   Code Status: Full CODE STATUS DVT Prophylaxis:   SCD's Family Communication: Discussed in detail with the patient, all imaging results, lab results explained to the patient    Disposition Plan: Transfer to the floor, hopefully DC home in a.m. if improving  Time Spent in minutes 25 minutes  Procedures:  VQ scan  Consultants:   None  Antimicrobials:   Anti-infectives (From admission, onward)   Start     Dose/Rate Route Frequency Ordered Stop   02/13/18 1630  cefTRIAXone (ROCEPHIN) 1 g in sodium chloride 0.9 % 100 mL IVPB     1 g 200 mL/hr over 30 Minutes Intravenous Every 24 hours 02/13/18 1613     02/13/18 1615  azithromycin (ZITHROMAX) 500 mg in sodium chloride 0.9 % 250 mL IVPB     500 mg 250 mL/hr over  60 Minutes Intravenous Every 24 hours 02/13/18 1613           Medications  Scheduled Meds: . aspirin EC  81 mg Oral Daily  . atenolol  50 mg Oral BID  . atorvastatin  20 mg Oral QHS  . enoxaparin (LOVENOX) injection  40 mg Subcutaneous Q24H  . gabapentin  300 mg Oral QHS  . guaiFENesin  600 mg Oral BID  . insulin aspart  0-9 Units Subcutaneous TID WC  . insulin aspart  3 Units Subcutaneous TID WC  . insulin glargine  10 Units Subcutaneous Daily  . ipratropium-albuterol  3 mL Nebulization TID  . methylPREDNISolone (SOLU-MEDROL) injection  40 mg Intravenous Q8H  . sertraline  50 mg Oral Daily  . sodium chloride flush  3 mL Intravenous Q12H  . traZODone  50 mg Oral QHS   Continuous Infusions: . sodium chloride    . azithromycin Stopped (02/14/18 1757)  . cefTRIAXone (ROCEPHIN)  IV Stopped (02/14/18  1836)   PRN Meds:.sodium chloride, acetaminophen **OR** acetaminophen, alum & mag hydroxide-simeth, aspirin-acetaminophen-caffeine, benzonatate, guaiFENesin-codeine, menthol-cetylpyridinium, ondansetron **OR** ondansetron (ZOFRAN) IV, sodium chloride flush, traMADol      Subjective:   Carney Saxton was seen and examined today.  Wheezing better, no acute complaints, states bringing up phlegm since yesterday.  No fevers.   Patient denies dizziness,  abdominal pain, N/V/D/C, new weakness, numbess, tingling.   Objective:   Vitals:   02/15/18 0600 02/15/18 0800 02/15/18 0900 02/15/18 1045  BP: (!) 140/47 (!) 170/75 137/62 (!) 142/69  Pulse: 61 69 80 77  Resp: 17 12 14 18   Temp:    97.8 F (36.6 C)  TempSrc:    Oral  SpO2: 100% 99% 97% 100%  Weight:      Height:        Intake/Output Summary (Last 24 hours) at 02/15/2018 1103 Last data filed at 02/14/2018 2139 Gross per 24 hour  Intake 3 ml  Output -  Net 3 ml     Wt Readings from Last 3 Encounters:  02/14/18 67.9 kg  12/23/17 67.4 kg  04/01/17 71.3 kg    Physical Exam  General: Alert and oriented x 3, NAD  Eyes: PERRLA, EOMI, Anicteric Sclera,  HEENT:    Cardiovascular: S1 S2 clear, RRR. No pedal edema b/l  Respiratory: Mild scattered wheezing, improving from yesterday  Gastrointestinal: Soft, nontender, nondistended, NBS  Ext: no pedal edema bilaterally  Neuro: no new deficits  Musculoskeletal: No cyanosis, clubbing  Skin: No rashes  Psych: Normal affect and demeanor, alert and oriented x3    Data Reviewed:  I have personally reviewed following labs and imaging studies  Micro Results Recent Results (from the past 240 hour(s))  Culture, blood (routine x 2)     Status: None (Preliminary result)   Collection Time: 02/13/18  4:51 PM  Result Value Ref Range Status   Specimen Description BLOOD LEFT WRIST  Final   Special Requests   Final    BOTTLES DRAWN AEROBIC AND ANAEROBIC Blood Culture adequate  volume Performed at [facility] [address], [place], [place] [address]    Culture NO GROWTH 2 DAYS  Final   Report Status PENDING  Incomplete  Culture, blood (routine x 2)     Status: None (Preliminary result)   Collection Time: 02/13/18  5:05 PM  Result Value Ref Range Status   Specimen Description BLOOD RIGHT ANTECUBITAL  Final   Special Requests   Final    BOTTLES DRAWN  AEROBIC AND ANAEROBIC Blood Culture adequate volume Performed at [facility] [address], [place], [place] [address]    Culture NO GROWTH 2 DAYS  Final   Report Status PENDING  Incomplete  MRSA PCR Screening     Status: None   Collection Time: 02/13/18 11:39 PM  Result Value Ref Range Status   MRSA by PCR NEGATIVE NEGATIVE Final    Comment:        The GeneXpert MRSA Assay (FDA approved for NASAL specimens only), is one component of a comprehensive MRSA colonization surveillance program. It is not intended to diagnose MRSA infection nor to guide or monitor treatment for MRSA infections. Performed at [facility], [place] [address], [place], [place] [address]     Radiology Reports Dg Chest 2 View  Result Date: 02/13/2018 CLINICAL DATA:  Cough and chest congestion over the last week. EXAM: CHEST - 2 VIEW COMPARISON:  12/20/2017 FINDINGS: Heart size is normal. Chronic aortic atherosclerosis. There may be central bronchial thickening but there is no infiltrate, collapse or effusion. Ordinary degenerative changes affect the spine. IMPRESSION: Possible bronchitis. No consolidation or collapse. Electronically Signed   By: Nelson Chimes M.D.   On: 02/13/2018 11:05   Nm Pulmonary Vent And Perf (v/q Scan)  Result Date: 02/13/2018 CLINICAL DATA:  Shortness of breath for 2 days EXAM: NUCLEAR MEDICINE VENTILATION - PERFUSION LUNG SCAN TECHNIQUE: Ventilation images were obtained in multiple projections using inhaled aerosol Tc-95m DTPA. Perfusion images were  obtained in multiple projections after intravenous injection of Tc-35m MAA. RADIOPHARMACEUTICALS:  31.5 mCi of Tc-57m DTPA aerosol inhalation and 4.4 mCi Tc66m MAA IV COMPARISON:  None Correlation: Chest radiograph 02/13/2018 FINDINGS: Ventilation: Central airway deposition of aerosol. Marked patchy peripheral ventilation throughout both lungs, pattern most consistent with parenchymal lung disease/COPD. Multiple subsegmental perfusion defects are identified. Large subsegmental defects in the upper lobes bilaterally. Diminished ventilation at lateral LEFT lung base. Perfusion: Large subsegmental perfusion defect in lateral RIGHT upper lobe corresponding to a matching ventilatory finding. Matching diminished perfusion at lateral LEFT lung base. Inhomogeneous perfusion throughout the remaining lungs without additional segmental or subsegmental perfusion defects. Chest radiograph: Bronchitic and probable emphysematous changes. IMPRESSION: Severely patchy impaired ventilation throughout both lungs consistent with parenchymal lung disease/COPD. Large matching ventilation and perfusion defects in lateral RIGHT upper lobe with matching small ventilation perfusion defects at lateral LEFT lower lobe. Findings represent a low probability for pulmonary embolism. Electronically Signed   By: Lavonia Dana M.D.   On: 02/13/2018 16:56   Vas [place] Lower Extremity Venous (dvt)  Result Date: 02/14/2018  Lower Venous Study Indications: Edema.  Performing Technologist: Oliver Hum, J  Examination Guidelines: A complete evaluation includes B-mode imaging, spectral Doppler, color Doppler, and power Doppler as needed of all accessible portions of each vessel. Bilateral testing is considered an integral part of a complete examination. Limited examinations for reoccurring indications may be performed as noted.  Right Venous Findings: +---------+---------------+---------+-----------+----------+-------+           CompressibilityPhasicitySpontaneityPropertiesSummary +---------+---------------+---------+-----------+----------+-------+ CFV      Full           Yes      Yes                          +---------+---------------+---------+-----------+----------+-------+ SFJ      Full                                                 +---------+---------------+---------+-----------+----------+-------+  FV Prox  Full                                                 +---------+---------------+---------+-----------+----------+-------+ FV Mid   Full                                                 +---------+---------------+---------+-----------+----------+-------+ FV DistalFull                                                 +---------+---------------+---------+-----------+----------+-------+ PFV      Full                                                 +---------+---------------+---------+-----------+----------+-------+ POP      Full           Yes      Yes                          +---------+---------------+---------+-----------+----------+-------+ PTV      Full                                                 +---------+---------------+---------+-----------+----------+-------+ PERO     Full                                                 +---------+---------------+---------+-----------+----------+-------+  Left Venous Findings: +---------+---------------+---------+-----------+----------+-------+          CompressibilityPhasicitySpontaneityPropertiesSummary +---------+---------------+---------+-----------+----------+-------+ CFV      Full           Yes      Yes                          +---------+---------------+---------+-----------+----------+-------+ SFJ      Full                                                 +---------+---------------+---------+-----------+----------+-------+ FV Prox  Full                                                  +---------+---------------+---------+-----------+----------+-------+ FV Mid   Full                                                 +---------+---------------+---------+-----------+----------+-------+  FV DistalFull                                                 +---------+---------------+---------+-----------+----------+-------+ PFV      Full                                                 +---------+---------------+---------+-----------+----------+-------+ POP      Full           Yes      Yes                          +---------+---------------+---------+-----------+----------+-------+ PTV      Full                                                 +---------+---------------+---------+-----------+----------+-------+ PERO     Full                                                 +---------+---------------+---------+-----------+----------+-------+    Summary: Right: There is no evidence of deep vein thrombosis in the lower extremity. No cystic structure found in the popliteal fossa. Left: There is no evidence of deep vein thrombosis in the lower extremity. No cystic structure found in the popliteal fossa.  *See table(s) above for measurements and observations. Electronically signed by Curt Jews MD on 02/14/2018 at 3:31:24 PM.    Final     Lab Data:  CBC: Recent Labs  Lab 02/13/18 1020 02/14/18 0655  WBC 6.9 11.1*  NEUTROABS 5.7  --   HGB 13.3 11.8*  HCT 39.8 31.8*  MCV 104.5* 106.7*  PLT 129* 354*   Basic Metabolic Panel: Recent Labs  Lab 02/13/18 1020 02/14/18 0655  NA 133* 133*  K 3.7 5.1  CL 93* 99  CO2 24 23  GLUCOSE 272* 307*  BUN 21 31*  CREATININE 1.20 1.32*  CALCIUM 9.5 8.8*  MG  --  1.9   GFR: Estimated Creatinine Clearance: 50.7 mL/min (A) (by C-G formula based on SCr of 1.32 mg/dL (H)). Liver Function Tests: Recent Labs  Lab 02/13/18 1020  AST 23  ALT 19  ALKPHOS 78  BILITOT 1.6*  PROT 7.4  ALBUMIN 4.4   No results for  input(s): LIPASE, AMYLASE in the last 168 hours. No results for input(s): AMMONIA in the last 168 hours. Coagulation Profile: No results for input(s): INR, PROTIME in the last 168 hours. Cardiac Enzymes: No results for input(s): CKTOTAL, CKMB, CKMBINDEX, TROPONINI in the last 168 hours. BNP (last 3 results) No results for input(s): PROBNP in the last 8760 hours. HbA1C: Recent Labs    02/15/18 0319  HGBA1C 7.4*   CBG: Recent Labs  Lab 02/14/18 0807 02/14/18 1211 02/14/18 1707 02/14/18 2121 02/15/18 0752  GLUCAP 302* 235* 124* 320* 191*   Lipid Profile: No results for input(s): CHOL, HDL, LDLCALC, TRIG, CHOLHDL, LDLDIRECT in the last 72 hours. Thyroid Function Tests: Recent  Labs    02/14/18 0308 02/14/18 0655  TSH 0.340*  --   FREET4  --  0.98   Anemia Panel: No results for input(s): VITAMINB12, FOLATE, FERRITIN, TIBC, IRON, RETICCTPCT in the last 72 hours. Urine analysis:    Component Value Date/Time   COLORURINE YELLOW 02/13/2018 1735   APPEARANCEUR CLEAR 02/13/2018 1735   LABSPEC 1.020 02/13/2018 1735   PHURINE 6.0 02/13/2018 1735   GLUCOSEU >=500 (A) 02/13/2018 1735   HGBUR MODERATE (A) 02/13/2018 1735   BILIRUBINUR NEGATIVE 02/13/2018 1735   BILIRUBINUR neg 09/10/2013 1308   KETONESUR 5 (A) 02/13/2018 1735   PROTEINUR 100 (A) 02/13/2018 1735   UROBILINOGEN 0.2 09/10/2013 1308   NITRITE NEGATIVE 02/13/2018 1735   LEUKOCYTESUR NEGATIVE 02/13/2018 1735     Kennede Lusk M.D. [facility] 02/15/2018, 11:03 AM  Pager: [phone] Between 7am to 7pm - call Pager - [phone]  After 7pm go to [email or web address] - password [email or web address]  Call night coverage person covering after 7pm

## 2018-02-16 DIAGNOSIS — E872 Acidosis: Secondary | ICD-10-CM

## 2018-02-16 LAB — GLUCOSE, CAPILLARY
Glucose-Capillary: 269 mg/dL — ABNORMAL HIGH (ref 70–99)
Glucose-Capillary: 210 mg/dL — ABNORMAL HIGH (ref 70–99)
Glucose-Capillary: 286 mg/dL — ABNORMAL HIGH (ref 70–99)

## 2018-02-16 MED ORDER — NICOTINE 21 MG/24HR TD PT24: 21.0000 mg | MEDICATED_PATCH | TRANSDERMAL | 1 refills | Status: AC

## 2018-02-16 MED ORDER — PIOGLITAZONE HCL 15 MG PO TABS: 15.0000 mg | ORAL_TABLET | Freq: Every day | ORAL | 3 refills | Status: DC

## 2018-02-16 MED ORDER — GUAIFENESIN ER 600 MG PO TB12: 600.0000 mg | ORAL_TABLET | Freq: Two times a day (BID) | ORAL | 0 refills | Status: AC

## 2018-02-16 MED ORDER — DOXYCYCLINE HYCLATE 100 MG PO TABS: 100.0000 mg | ORAL_TABLET | Freq: Two times a day (BID) | ORAL | 0 refills | Status: AC

## 2018-02-16 MED ORDER — BENZONATATE 200 MG PO CAPS: 200.0000 mg | ORAL_CAPSULE | Freq: Three times a day (TID) | ORAL | 0 refills | Status: DC | PRN

## 2018-02-16 MED ORDER — DOXYCYCLINE HYCLATE 100 MG PO TABS
100.0000 mg | ORAL_TABLET | Freq: Two times a day (BID) | ORAL | Status: DC
  Administered 2018-02-16: 100 mg via ORAL
  Filled 2018-02-16: qty 1

## 2018-02-16 MED ORDER — UMECLIDINIUM-VILANTEROL 62.5-25 MCG/INH IN AEPB: 1.0000 | INHALATION_SPRAY | Freq: Every day | RESPIRATORY_TRACT | 3 refills | Status: DC

## 2018-02-16 MED ORDER — ALBUTEROL SULFATE (2.5 MG/3ML) 0.083% IN NEBU
2.5000 mg | INHALATION_SOLUTION | Freq: Four times a day (QID) | RESPIRATORY_TRACT | Status: DC
  Administered 2018-02-16: 2.5 mg via RESPIRATORY_TRACT
  Filled 2018-02-16 (×2): qty 3

## 2018-02-16 MED ORDER — ALBUTEROL SULFATE HFA 108 (90 BASE) MCG/ACT IN AERS: 2.0000 | INHALATION_SPRAY | Freq: Four times a day (QID) | RESPIRATORY_TRACT | 2 refills | Status: DC | PRN

## 2018-02-16 MED ORDER — UMECLIDINIUM-VILANTEROL 62.5-25 MCG/INH IN AEPB
1.0000 | INHALATION_SPRAY | Freq: Every day | RESPIRATORY_TRACT | Status: DC
  Administered 2018-02-16: 1 via RESPIRATORY_TRACT
  Filled 2018-02-16: qty 14

## 2018-02-16 MED ORDER — PREDNISONE 20 MG PO TABS: 40.0000 mg | ORAL_TABLET | Freq: Every day | ORAL | 0 refills | Status: AC

## 2018-02-16 MED ORDER — GLIPIZIDE 10 MG PO TABS: 10.0000 mg | ORAL_TABLET | Freq: Two times a day (BID) | ORAL | 3 refills | Status: AC

## 2018-02-16 NOTE — Discharge Summary (Signed)
Physician Discharge Summary   Patient ID: Adrian Andrews MRN: [id number] DOB/AGE: [birth year] 68 y.o.  Admit date: 02/13/2018 Discharge date: 02/16/2018  Primary Care Physician:  [name], MD   Recommendations for Outpatient Follow-up:  1. Follow up with PCP in 1-2 weeks 2. Pulmonology follow-up scheduled on 02/25/2018 3. Patient qualified for home oxygen Patient Saturations on Room Air at Rest = 92 %  Patient Saturations on Room Air while Ambulating = 87%  Patient Saturations on 2 Liters of oxygen while Ambulating = 96%   Home Health: None Equipment/Devices: Home O2 2 L  Discharge Condition: stable  CODE STATUS: FULL   Diet recommendation: Heart healthy diet   Discharge Diagnoses:    . Acute on chronic respiratory failure with hypoxia ([place]) . COPD with acute exacerbation ([place]) . Nicotine use disorder . Chronic kidney disease (CKD) stage II Lactic acidosis Type 2 diabetes mellitus with complication including PVD, CKD   Consults: None    Allergies:   Allergies  Allergen Reactions  . Clindamycin/Lincomycin     Throat tightens up  . Contrast Media [Iodinated Diagnostic Agents] Shortness Of Breath    11/20/2004 reaction with CTA CAP  . Metformin And Related Other (See Comments)    Effected kidney function     DISCHARGE MEDICATIONS: Allergies as of 02/16/2018      Reactions   Clindamycin/lincomycin    Throat tightens up   Contrast Media [iodinated Diagnostic Agents] Shortness Of Breath   11/20/2004 reaction with CTA CAP   Metformin And Related Other (See Comments)   Effected kidney function      Medication List    STOP taking these medications   azithromycin 250 MG tablet Commonly known as:  ZITHROMAX     TAKE these medications   albuterol 108 (90 Base) MCG/ACT inhaler Commonly known as:  PROVENTIL HFA;VENTOLIN HFA Inhale 2 puffs into the lungs every 6 (six) hours as needed for wheezing or shortness of breath.   aspirin EC 81 MG tablet Take  81 mg by mouth daily.   aspirin-acetaminophen-caffeine 761-950-93 MG tablet Commonly known as:  EXCEDRIN MIGRAINE Take by mouth every 6 (six) hours as needed for headache.   atenolol 50 MG tablet Commonly known as:  TENORMIN Take 50 mg by mouth 2 (two) times daily.   atorvastatin 40 MG tablet Commonly known as:  LIPITOR Take 20 mg by mouth at bedtime.   benzonatate 200 MG capsule Commonly known as:  TESSALON Take 1 capsule (200 mg total) by mouth 3 (three) times daily as needed for cough (use 1st).   doxycycline 100 MG tablet Commonly known as:  VIBRA-TABS Take 1 tablet (100 mg total) by mouth 2 (two) times daily for 7 days.   gabapentin 300 MG capsule Commonly known as:  NEURONTIN Take 300 mg by mouth at bedtime.   glipiZIDE 10 MG tablet Commonly known as:  GLUCOTROL Take 1 tablet (10 mg total) by mouth 2 (two) times daily before a meal. What changed:  when to take this   guaiFENesin 600 MG 12 hr tablet Commonly known as:  MUCINEX Take 1 tablet (600 mg total) by mouth 2 (two) times daily.   nicotine 21 mg/24hr patch Commonly known as:  NICODERM CQ - dosed in mg/24 hours Place 1 patch (21 mg total) onto the skin daily.   pioglitazone 15 MG tablet Commonly known as:  ACTOS Take 1 tablet (15 mg total) by mouth daily.   predniSONE 20 MG tablet Commonly known as:  DELTASONE Take  2 tablets (40 mg total) by mouth daily before breakfast for 5 days. What changed:    medication strength  how much to take  how to take this  when to take this  additional instructions   sertraline 50 MG tablet Commonly known as:  ZOLOFT Take 50 mg by mouth daily.   traMADol 50 MG tablet Commonly known as:  ULTRAM Take 1 tablet (50 mg total) by mouth 3 (three) times daily as needed for moderate pain (arthritis).   traZODone 50 MG tablet Commonly known as:  DESYREL Take 50 mg by mouth at bedtime.   umeclidinium-vilanterol 62.5-25 MCG/INH Aepb Commonly known as:  ANORO  ELLIPTA Inhale 1 puff into the lungs daily.            Durable Medical Equipment  (From admission, onward)         Start     Ordered   02/16/18 0937  For home use only DME oxygen  Once    Question Answer Comment  Mode or (Route) Nasal cannula   Liters per Minute 2   Frequency Continuous (stationary and portable oxygen unit needed)   Oxygen conserving device Yes   Oxygen delivery system Gas      02/16/18 0936           Brief H and P: For complete details please refer to admission H and P, but in brief Patient is a 68 year old male with CKD stage II, diabetes, disease, smoker, history of COPD presented with shortness of breath, wheezing, productive cough, chills for 2 days.   D-dimer 1.3, chest x-ray showed bronchitis VQ scan with low probability of PE   Hospital Course:   Acute on chronic respiratory failure with hypoxia (HCC) likely has underlying COPD, now COPD exacerbation -At the time of admission, patient reported difficulty breathing, productive cough with chills for last 2 days, chest x-ray with bronchitis, hypoxic, patient was placed on 4 L O2 -Patient reported noncompliance with home O2 -Elevated d-dimer, VQ scan negative for PE, had 2D echo in 12/2017 showed EF of 60 to 65% with no regional wall motion abnormalities. -Doppler ultrasound negative for DVT -Patient was placed on IV Solu-Medrol, scheduled nebs, antibiotics, flutter valve, IV Zithromax and Rocephin -O2 has been weaned down, underwent home O2 evaluation, qualify for 2 L home O2 -Transition to oral prednisone, started on Anoro, albuterol inhaler as needed, doxycycline for 7 days, strongly counseled for smoking cessation Outpatient appointment with pulmonology scheduled    Lactic acidosis -Improved    Type 2 diabetes mellitus with other specified complication ([place]), PVD, chronic kidney disease --CBGs uncontrolled secondary to steroids, patient was placed on insulin regimen while inpatient   -Hemoglobin A1c 7.4 -Continue glipizide, Actos     COPD with acute exacerbation ([place]) -Will likely need outpatient PFTs once improved    Nicotine use disorder -Counseled strongly on nicotine cessation, placed on nicotine patch    Chronic kidney disease (CKD) stage II -Baseline creatinine 1.2-1.3, currently at baseline Creatinine currently at baseline  PT evaluation was done, no PT needs  Day of Discharge S: Feels a lot better today, wheezing has improved, no fevers.  BP (!) 144/74 (BP Location: Right Arm)   Pulse 76   Temp 97.6 F (36.4 C) (Oral)   Resp 17   Ht 5\' 9"  (1.753 m)   Wt 67.9 kg   SpO2 96%   BMI 22.11 kg/m   Physical Exam: General: Alert and awake oriented x3 not in any acute distress. HEENT:  anicteric sclera, pupils reactive to light and accommodation CVS: S1-S2 clear no murmur rubs or gallops Chest: clear to auscultation bilaterally, no wheezing rales or rhonchi Abdomen: soft nontender, nondistended, normal bowel sounds Extremities: no cyanosis, clubbing or edema noted bilaterally Neuro: Cranial nerves II-XII intact, no focal neurological deficits   The results of significant diagnostics from this hospitalization (including imaging, microbiology, ancillary and laboratory) are listed below for reference.      Procedures/Studies:  Dg Chest 2 View  Result Date: 02/13/2018 CLINICAL DATA:  Cough and chest congestion over the last week. EXAM: CHEST - 2 VIEW COMPARISON:  12/20/2017 FINDINGS: Heart size is normal. Chronic aortic atherosclerosis. There may be central bronchial thickening but there is no infiltrate, collapse or effusion. Ordinary degenerative changes affect the spine. IMPRESSION: Possible bronchitis. No consolidation or collapse. Electronically Signed   By: Nelson Chimes M.D.   On: 02/13/2018 11:05   Nm Pulmonary Vent And Perf (v/q Scan)  Result Date: 02/13/2018 CLINICAL DATA:  Shortness of breath for 2 days EXAM: NUCLEAR MEDICINE  VENTILATION - PERFUSION LUNG SCAN TECHNIQUE: Ventilation images were obtained in multiple projections using inhaled aerosol Tc-70m DTPA. Perfusion images were obtained in multiple projections after intravenous injection of Tc-1m MAA. RADIOPHARMACEUTICALS:  31.5 mCi of Tc-73m DTPA aerosol inhalation and 4.4 mCi Tc38m MAA IV COMPARISON:  None Correlation: Chest radiograph 02/13/2018 FINDINGS: Ventilation: Central airway deposition of aerosol. Marked patchy peripheral ventilation throughout both lungs, pattern most consistent with parenchymal lung disease/COPD. Multiple subsegmental perfusion defects are identified. Large subsegmental defects in the upper lobes bilaterally. Diminished ventilation at lateral LEFT lung base. Perfusion: Large subsegmental perfusion defect in lateral RIGHT upper lobe corresponding to a matching ventilatory finding. Matching diminished perfusion at lateral LEFT lung base. Inhomogeneous perfusion throughout the remaining lungs without additional segmental or subsegmental perfusion defects. Chest radiograph: Bronchitic and probable emphysematous changes. IMPRESSION: Severely patchy impaired ventilation throughout both lungs consistent with parenchymal lung disease/COPD. Large matching ventilation and perfusion defects in lateral RIGHT upper lobe with matching small ventilation perfusion defects at lateral LEFT lower lobe. Findings represent a low probability for pulmonary embolism. Electronically Signed   By: Lavonia Dana M.D.   On: 02/13/2018 16:56   Vas [place] Lower Extremity Venous (dvt)  Result Date: 02/14/2018  Lower Venous Study Indications: Edema.  Performing Technologist: Oliver Hum, J  Examination Guidelines: A complete evaluation includes B-mode imaging, spectral Doppler, color Doppler, and power Doppler as needed of all accessible portions of each vessel. Bilateral testing is considered an integral part of a complete examination. Limited examinations for reoccurring  indications may be performed as noted.  Right Venous Findings: +---------+---------------+---------+-----------+----------+-------+          CompressibilityPhasicitySpontaneityPropertiesSummary +---------+---------------+---------+-----------+----------+-------+ CFV      Full           Yes      Yes                          +---------+---------------+---------+-----------+----------+-------+ SFJ      Full                                                 +---------+---------------+---------+-----------+----------+-------+ FV Prox  Full                                                 +---------+---------------+---------+-----------+----------+-------+  FV Mid   Full                                                 +---------+---------------+---------+-----------+----------+-------+ FV DistalFull                                                 +---------+---------------+---------+-----------+----------+-------+ PFV      Full                                                 +---------+---------------+---------+-----------+----------+-------+ POP      Full           Yes      Yes                          +---------+---------------+---------+-----------+----------+-------+ PTV      Full                                                 +---------+---------------+---------+-----------+----------+-------+ PERO     Full                                                 +---------+---------------+---------+-----------+----------+-------+  Left Venous Findings: +---------+---------------+---------+-----------+----------+-------+          CompressibilityPhasicitySpontaneityPropertiesSummary +---------+---------------+---------+-----------+----------+-------+ CFV      Full           Yes      Yes                          +---------+---------------+---------+-----------+----------+-------+ SFJ      Full                                                  +---------+---------------+---------+-----------+----------+-------+ FV Prox  Full                                                 +---------+---------------+---------+-----------+----------+-------+ FV Mid   Full                                                 +---------+---------------+---------+-----------+----------+-------+ FV DistalFull                                                 +---------+---------------+---------+-----------+----------+-------+  PFV      Full                                                 +---------+---------------+---------+-----------+----------+-------+ POP      Full           Yes      Yes                          +---------+---------------+---------+-----------+----------+-------+ PTV      Full                                                 +---------+---------------+---------+-----------+----------+-------+ PERO     Full                                                 +---------+---------------+---------+-----------+----------+-------+    Summary: Right: There is no evidence of deep vein thrombosis in the lower extremity. No cystic structure found in the popliteal fossa. Left: There is no evidence of deep vein thrombosis in the lower extremity. No cystic structure found in the popliteal fossa.  *See table(s) above for measurements and observations. Electronically signed by Curt Jews MD on 02/14/2018 at 3:31:24 PM.    Final       LAB RESULTS: Basic Metabolic Panel: Recent Labs  Lab 02/13/18 1020 02/14/18 0655  NA 133* 133*  K 3.7 5.1  CL 93* 99  CO2 24 23  GLUCOSE 272* 307*  BUN 21 31*  CREATININE 1.20 1.32*  CALCIUM 9.5 8.8*  MG  --  1.9   Liver Function Tests: Recent Labs  Lab 02/13/18 1020  AST 23  ALT 19  ALKPHOS 78  BILITOT 1.6*  PROT 7.4  ALBUMIN 4.4   No results for input(s): LIPASE, AMYLASE in the last 168 hours. No results for input(s): AMMONIA in the last 168 hours. CBC: Recent Labs  Lab  02/13/18 1020 02/14/18 0655  WBC 6.9 11.1*  NEUTROABS 5.7  --   HGB 13.3 11.8*  HCT 39.8 31.8*  MCV 104.5* 106.7*  PLT 129* 121*   Cardiac Enzymes: No results for input(s): CKTOTAL, CKMB, CKMBINDEX, TROPONINI in the last 168 hours. BNP: Invalid input(s): POCBNP CBG: Recent Labs  Lab 02/16/18 0744 02/16/18 1122  GLUCAP 210* 286*      Disposition and Follow-up:    DISPOSITION: Home   DISCHARGE FOLLOW-UP Follow-up Information    [name], MD. Schedule an appointment as soon as possible for a visit in 2 week(s).   Specialty:  Family Medicine Contact information: [address] [place] [address] [place] Follow up.   Why:  For home oxygen Contact information: [address] [place] [address] [phone]        [email or web address] Follow up.   Why:  for prescription coupons       Laurin Coder, MD Follow up on 02/25/2018.   Specialty:  Pulmonary Disease Why:  at 10:30 am (lung doctor appointment). Please arrive  86mins before.  Contact information: [address] [place] [address] [phone]            Time coordinating discharge:  35 minutes  Signed:   Estill Cotta M.D. [facility] 02/16/2018, 12:04 PM Pager: [phone]

## 2018-02-16 NOTE — Care Management (Signed)
This CM spoke with pt at bedside for dc planning. Pt with new home 02 order. [facility] rep alerted of new need for home 02. Pt states he is having difficulty with all his medication copays. This CM looked up pt medications on [email or web address] and informed pt that some of his medications are cheaper with a coupon then what he is paying with his insurance. Pt thankful for information. Marney Doctor RN,BSN [phone]

## 2018-02-16 NOTE — Progress Notes (Signed)
SATURATION QUALIFICATIONS: (This note is used to comply with regulatory documentation for home oxygen)  Patient Saturations on Room Air at Rest = 92 %  Patient Saturations on Room Air while Ambulating = 87%  Patient Saturations on 2 Liters of oxygen while Ambulating = 96%  Please briefly explain why patient needs home oxygen: Patient has a history of COPD, in with an exacerbation. Patient gets extremely short of breath on exertion.

## 2018-02-16 NOTE — Evaluation (Signed)
Physical Therapy Evaluation Patient Details Name: Adrian Andrews MRN: [id number] DOB: [birth year] Today's Date: 02/16/2018   History of Present Illness  68 year old male with CKD stage II, diabetes, disease, smoker, history of COPD presented with shortness of breath, productive cough, chills for 2 days  Clinical Impression  Pt is independent with mobility and is ready to DC home from PT standpoint. He ambulated 220' without an assistive device, SaO2 91-93% on 2L O2 ambulating. PT signing off.     Follow Up Recommendations No PT follow up    Equipment Recommendations  None recommended by PT    Recommendations for Other Services       Precautions / Restrictions Precautions Precautions: Other (comment) Precaution Comments: monitor O2 Restrictions Weight Bearing Restrictions: No      Mobility  Bed Mobility               General bed mobility comments: up on edge of bed  Transfers Overall transfer level: Independent                  Ambulation/Gait Ambulation/Gait assistance: Independent Gait Distance (Feet): 220 Feet Assistive device: None Gait Pattern/deviations: WFL(Within Functional Limits) Gait velocity: WNL   General Gait Details: steady with RW, SaO2 91-93% on 2L O2 walking, HR 80s, 2/4 dyspnea, VCs pursed lip breathing  Stairs            Wheelchair Mobility    Modified Rankin (Stroke Patients Only)       Balance Overall balance assessment: Independent                                           Pertinent Vitals/Pain Pain Assessment: No/denies pain    Home Living Family/patient expects to be discharged to:: Private residence Living Arrangements: Alone Available Help at Discharge: Other (Comment)(ex wife lives across the street and can assist as needed)           Home Equipment: Shower seat      Prior Function Level of Independence: Independent         Comments: walks without AD, denies falls in past 1  year, drives     Hand Dominance        Extremity/Trunk Assessment   Upper Extremity Assessment Upper Extremity Assessment: Overall WFL for tasks assessed    Lower Extremity Assessment Lower Extremity Assessment: Overall WFL for tasks assessed    Cervical / Trunk Assessment Cervical / Trunk Assessment: Normal  Communication   Communication: No difficulties  Cognition Arousal/Alertness: Awake/alert Behavior During Therapy: WFL for tasks assessed/performed Overall Cognitive Status: Within Functional Limits for tasks assessed                                        General Comments      Exercises     Assessment/Plan    PT Assessment Patent does not need any further PT services  PT Problem List         PT Treatment Interventions      PT Goals (Current goals can be found in the Care Plan section)  Acute Rehab PT Goals PT Goal Formulation: All assessment and education complete, DC therapy    Frequency     Barriers to discharge        Co-evaluation  AM-PAC PT "6 Clicks" Mobility  Outcome Measure Help needed turning from your back to your side while in a flat bed without using bedrails?: None Help needed moving from lying on your back to sitting on the side of a flat bed without using bedrails?: None Help needed moving to and from a bed to a chair (including a wheelchair)?: None Help needed standing up from a chair using your arms (e.g., wheelchair or bedside chair)?: None Help needed to walk in hospital room?: None Help needed climbing 3-5 steps with a railing? : None 6 Click Score: 24    End of Session Equipment Utilized During Treatment: Gait belt;Oxygen Activity Tolerance: Patient tolerated treatment well Patient left: in chair;with call bell/phone within reach Nurse Communication: Mobility status      Time: 3254-9826 PT Time Calculation (min) (ACUTE ONLY): 15 min   Charges:   PT Evaluation $PT Eval Low  Complexity: 1 Low          Philomena Doheny PT 02/16/2018  Acute Rehabilitation Services Pager [phone] Office [phone]

## 2018-02-18 LAB — CULTURE, BLOOD (ROUTINE X 2)
CULTURE: NO GROWTH
Culture: NO GROWTH
SPECIAL REQUESTS: ADEQUATE
Special Requests: ADEQUATE

## 2018-02-24 DIAGNOSIS — N289 Disorder of kidney and ureter, unspecified: Secondary | ICD-10-CM | POA: Diagnosis not present

## 2018-02-24 DIAGNOSIS — J9601 Acute respiratory failure with hypoxia: Secondary | ICD-10-CM | POA: Diagnosis not present

## 2018-02-24 DIAGNOSIS — J441 Chronic obstructive pulmonary disease with (acute) exacerbation: Secondary | ICD-10-CM | POA: Diagnosis not present

## 2018-02-24 DIAGNOSIS — F17200 Nicotine dependence, unspecified, uncomplicated: Secondary | ICD-10-CM | POA: Diagnosis not present

## 2018-02-24 DIAGNOSIS — D649 Anemia, unspecified: Secondary | ICD-10-CM | POA: Diagnosis not present

## 2018-02-25 ENCOUNTER — Inpatient Hospital Stay: Payer: Medicare Other | Admitting: Pulmonary Disease

## 2018-03-07 IMAGING — MR MR MRA HEAD W/O CM
10 series · 41 of 48 positions shown · non-contrast
Comparison: Head CT without contrast 01/03/2017.

CLINICAL DATA: 66-year-old male with 2 months of abnormal speech.
TECHNIQUE: Multiplanar, multiecho pulse sequences of the brain and surrounding
structures were obtained without intravenous contrast. Angiographic
images of the head were obtained using MRA technique without
contrast.

[Series 2: T1 · sagittal · 5.0mm · 0.45mm/px · 2 of 21 slices shown]
[im 1/21]
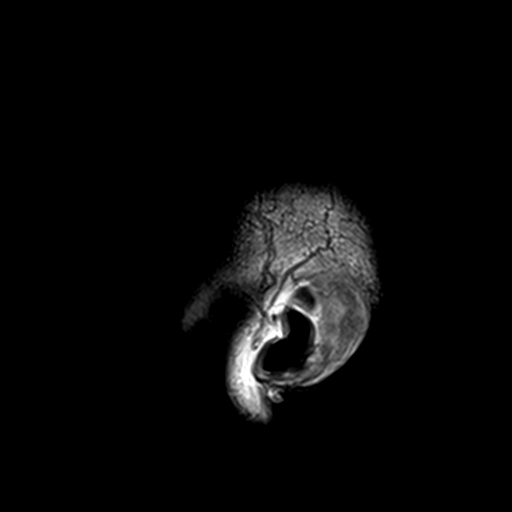
[im 21/21]
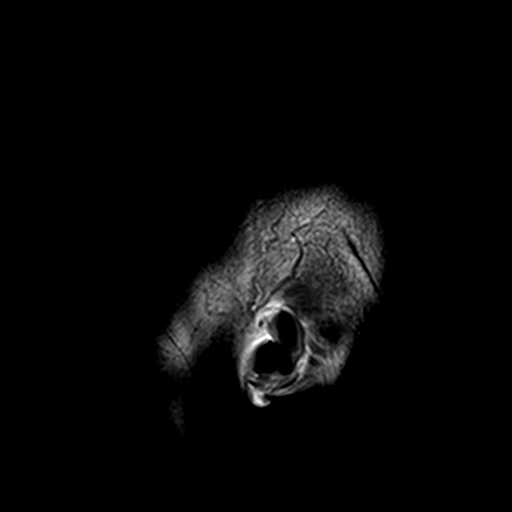

[Series 3: DWI · axial · 3.0mm · 1.80mm/px · z∈[-69,+73]mm · 9 of 100 slices shown (1 of 4)]
[im 1/100]
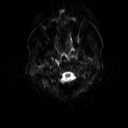
[im 13/100]
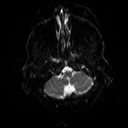
[im 25/100]
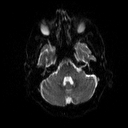
[im 38/100]
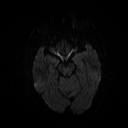
[im 50/100]
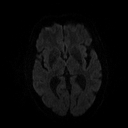
[im 62/100]
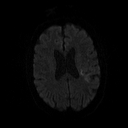
[im 75/100]
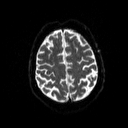
[im 87/100]
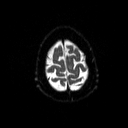
[im 100/100]
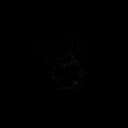

[Series 4: DWI · axial · 3.0mm · 1.80mm/px · z∈[-69,+73]mm · 4 of 47 slices shown (2 of 4)]
[im 1/47]
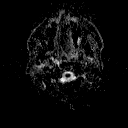
[im 16/47]
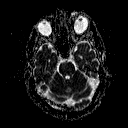
[im 31/47]
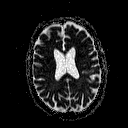
[im 47/47]
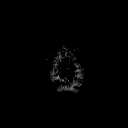

[Series 5: T2 · axial · 5.0mm · 0.51mm/px · z∈[-68,+74]mm · 2 of 22 slices shown (1 of 2)]
[im 1/22]
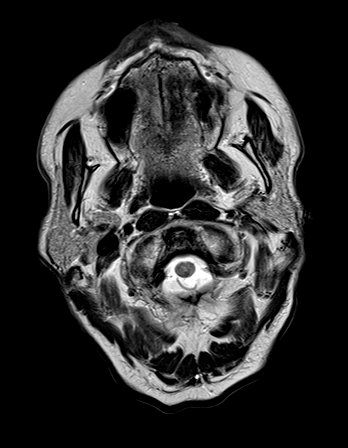
[im 22/22]
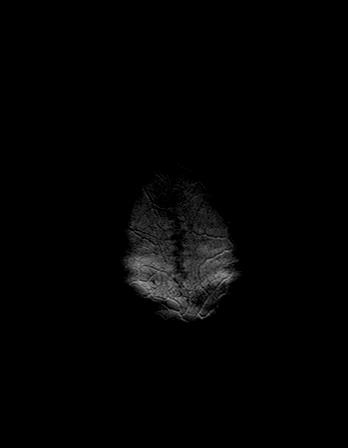

[Series 6: FLAIR · axial · 3.0mm · 0.45mm/px · z∈[-76,+73]mm · 3 of 34 slices shown]
[im 1/34]
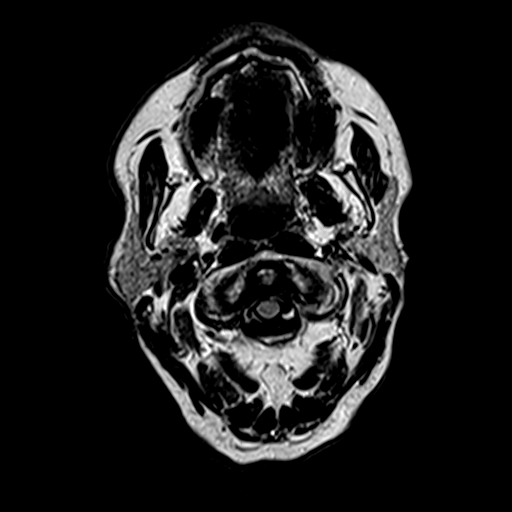
[im 17/34]
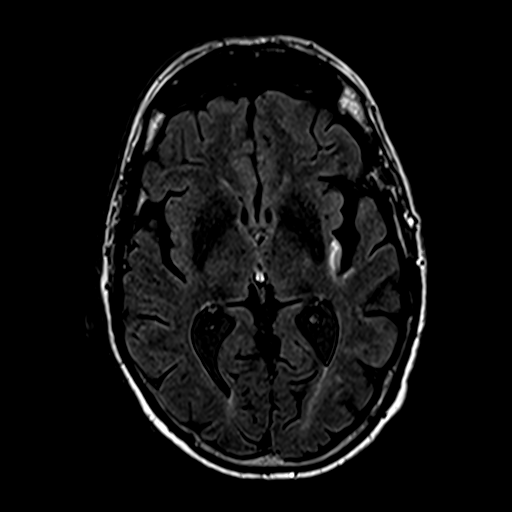
[im 34/34]
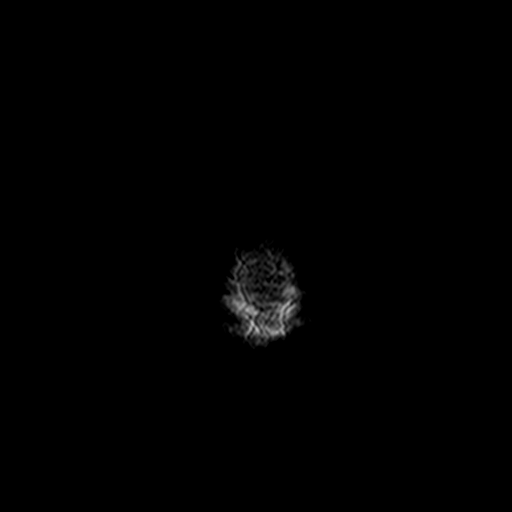

[Series 8: swi_images · axial · 5.0mm · 0.90mm/px · z∈[-69,+72]mm · 3 of 30 slices shown]
[im 1/30]
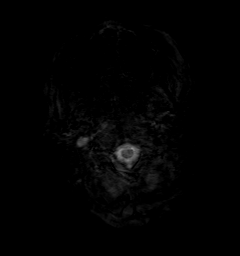
[im 15/30]
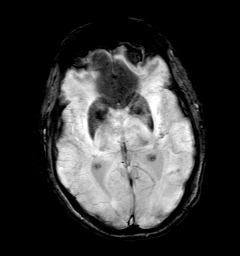
[im 30/30]
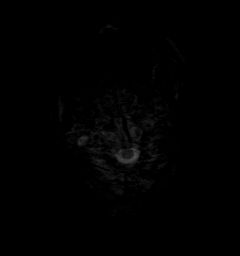

[Series 9: T2 · coronal · 5.0mm · 0.45mm/px · 3 of 28 slices shown (2 of 2)]
[im 1/28]
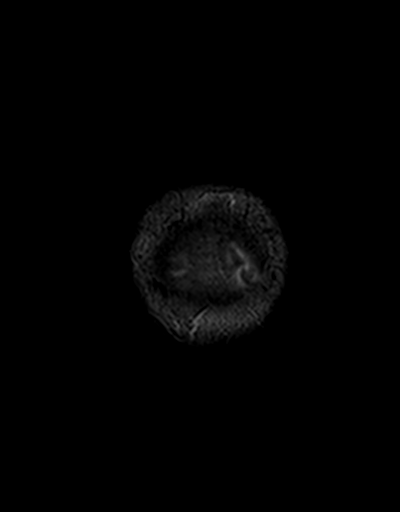
[im 14/28]
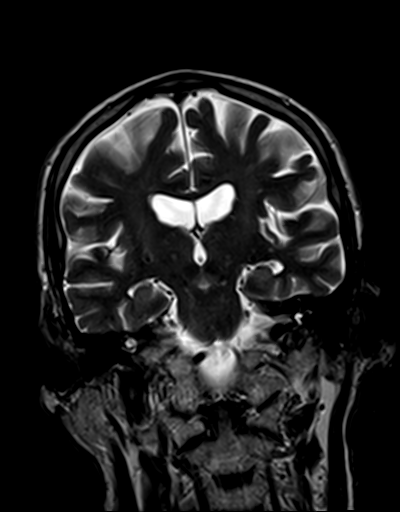
[im 28/28]
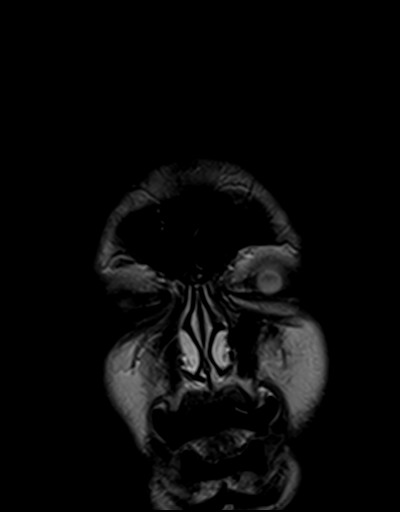

[Series 10: DWI · coronal · 5.0mm · 1.80mm/px · 6 of 68 slices shown (3 of 4)]
[im 1/68]
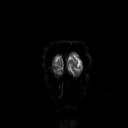
[im 14/68]
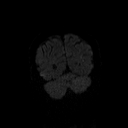
[im 27/68]
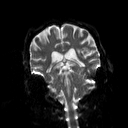
[im 41/68]
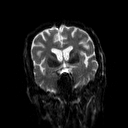
[im 54/68]
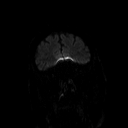
[im 68/68]
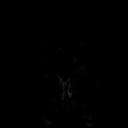

[Series 11: DWI · coronal · 5.0mm · 1.80mm/px · 3 of 33 slices shown (4 of 4)]
[im 1/33]
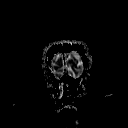
[im 17/33]
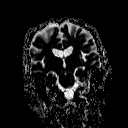
[im 33/33]
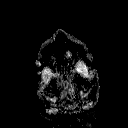

[Series 14: tof_3d_multi-slab · axial · 0.7mm · 0.35mm/px · z∈[-67,-10]mm · 6 of 136 slices shown]
[im 1/136]
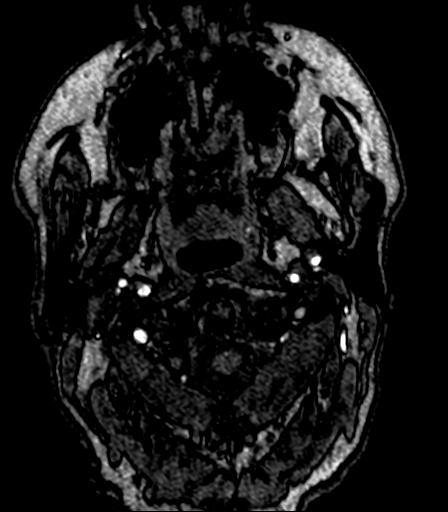
[im 23/136]
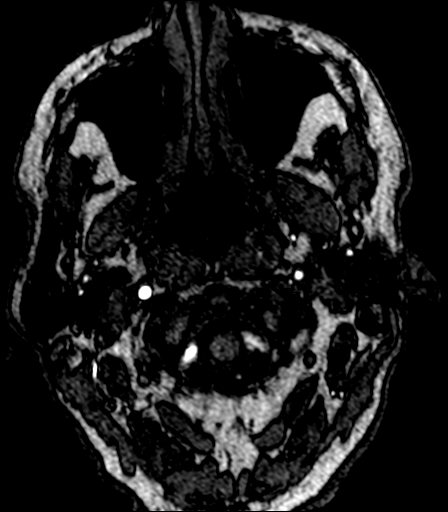
[im 46/136]
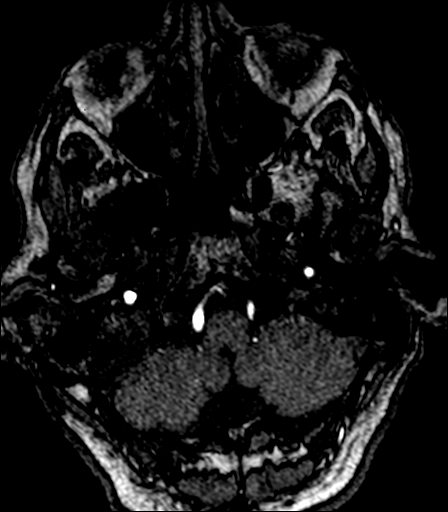
[im 57/136]
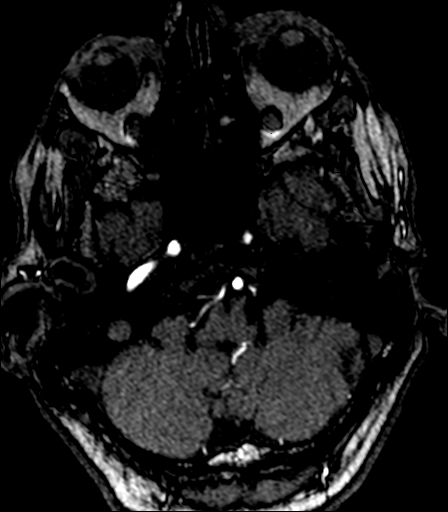
[im 79/136]
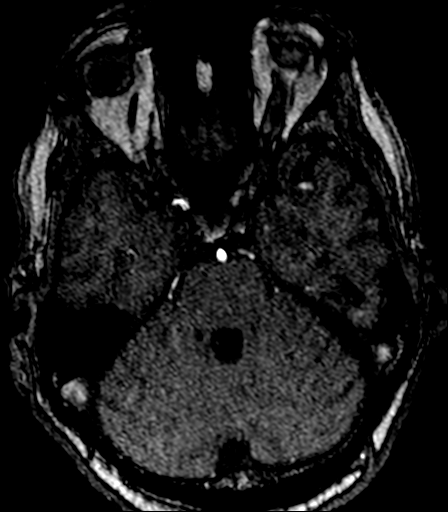
[im 91/136]
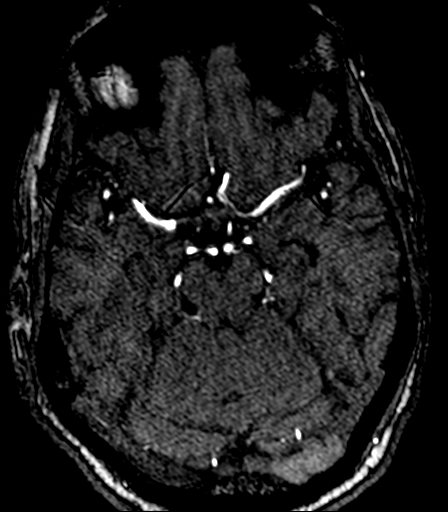

[41 of 48 positions shown; findings below may reference images not displayed]

Personal history of severe left cervical carotid (sonographic string
sign) and moderate to severe right cervical carotid stenosis on
Doppler ultrasound July 2016.

History also of chronic left subclavian origin occlusion and left
Subclavian Steal syndrome.

EXAM:
MRI HEAD WITHOUT CONTRAST

MRA HEAD WITHOUT CONTRAST
FINDINGS: MRI HEAD FINDINGS

Brain: Scattered abnormal cortical and subcortical white matter
diffusion, T2 and FLAIR hyperintensity along the left sylvian
fissure. Mild involvement also of the left periatrial white matter
and the left corona radiata (series 3, image 85). The areas
demonstrate a mix of facilitated and I so intense diffusion on DWI.
There is hemosiderin (series 8, image 19), and some developing
encephalomalacia (series 5, image 14). No associated mass effect.

No similar signal abnormality in the contralateral right hemisphere
or the posterior fossa. Gray and white matter signal in those areas
is normal for age.

No evidence of mass lesion, ventriculomegaly, extra-axial collection
or acute intracranial hemorrhage. Cervicomedullary junction and
pituitary are within normal limits.

Vascular: Major intracranial vascular flow voids are normal aside
from the left ICA siphon which appears diminished (series 5, image
5. See MRA findings below.

Skull and upper cervical spine: Negative visualized cervical spine.
Normal bone marrow signal.

Sinuses/Orbits: Normal orbits soft tissues. Paranasal Visualized
paranasal sinuses and mastoids are stable and well pneumatized.

Other: Visible internal auditory structures appear normal. Scalp and
face soft tissues appear negative.

MRA HEAD FINDINGS

Asymmetric decreased size of antegrade flow signal in the visible
distal cervical left ICA. Diminished antegrade flow signal in the
left ICA siphon, severe at the anterior genu. However, the
supraclinoid left ICA and left ICA terminus remain patent. The left
MCA and ACA origins are patent (the left A1 appears to be non
dominant). The left MCA M1 segment and left MCA bifurcation are
patent. No left MCA branch occlusion is identified.

Normal antegrade flow signal throughout the right ICA siphon
although with irregularity at the anterior genu raising the
possibility of mild stenosis. Normal right ophthalmic artery origin.
Normal bilateral posterior communicating artery origins. Normal
right ICA terminus. Normal right MCA and ACA origin. The right A[DATE] be dominant. Anterior communicating artery and visible ACA
branches are within normal limits. Right MCA M1 segment and visible
right MCA branches are within normal limits.

Antegrade flow in the posterior circulation, although diminished
flow signal in the distal left vertebral artery likely due to
chronic retrograde flow in that vessel in the setting of left
subclavian steal syndrome.

Normal flow signal in the distal right vertebral artery. Patent left
PICA and vertebrobasilar junction. Patent AICA origins. No basilar
stenosis. Patent SCA and PCA origins. Normal bilateral PCA branches.
IMPRESSION: 1. Scattered small subacute appearing infarcts in the middle and
posterior Left MCA territory. Associated hemosiderin. No mass
effect. No acute or chronic ischemic changes elsewhere in the brain.
2. MRA reveals decreased flow signal in the Left ICA siphon
compatible with a high-grade upstream stenosis (note a sonographic
string sign stenosis was documented in the neck 6 months ago) but
the Left ICA remains patent at this time.
3. MRA appearance of the distal left vertebral artery likely
reflects the chronic Left Subclavian Steal Syndrome.
4. Study discussed by telephone with Dr. ADRIS BONDOC on 02/13/2017

## 2018-03-13 ENCOUNTER — Ambulatory Visit (INDEPENDENT_AMBULATORY_CARE_PROVIDER_SITE_OTHER): Payer: Medicare Other | Admitting: Pulmonary Disease

## 2018-03-13 ENCOUNTER — Encounter: Payer: Self-pay | Admitting: Pulmonary Disease

## 2018-03-13 VITALS — BP 110/78 | HR 70 | Ht 69.0 in | Wt 148.0 lb

## 2018-03-13 DIAGNOSIS — J449 Chronic obstructive pulmonary disease, unspecified: Secondary | ICD-10-CM

## 2018-03-13 DIAGNOSIS — D649 Anemia, unspecified: Secondary | ICD-10-CM | POA: Diagnosis not present

## 2018-03-13 DIAGNOSIS — N289 Disorder of kidney and ureter, unspecified: Secondary | ICD-10-CM | POA: Diagnosis not present

## 2018-03-13 DIAGNOSIS — R0602 Shortness of breath: Secondary | ICD-10-CM

## 2018-03-13 MED ORDER — GLYCOPYRROLATE-FORMOTEROL 9-4.8 MCG/ACT IN AERO: 2.0000 | INHALATION_SPRAY | Freq: Two times a day (BID) | RESPIRATORY_TRACT | 0 refills | Status: DC

## 2018-03-13 NOTE — Progress Notes (Signed)
Adrian Andrews    [id number]    25-Apr-1950  Primary Care Physician:White, Caren Griffins, MD  Referring Physician: [name], MD [place], [place] [address]  Chief complaint:   Patient with a history of shortness of breath  HPI:  Was recently treated in the hospital for COPD exacerbation/pneumonia He is an active smoker-working on quitting He denies shortness of breath at rest but does get short of breath with activity Has had a CVA in the past which limited his activity level   He has multiple comorbidities including diabetes, hypertension, hyperlipidemia, peripheral vascular disease,  He is short of breath with mild activity  He was recently discharged home from the hospital with oxygen supplementation  Outpatient Encounter Medications as of 03/13/2018  Medication Sig  . albuterol (PROVENTIL HFA;VENTOLIN HFA) 108 (90 Base) MCG/ACT inhaler Inhale 2 puffs into the lungs every 6 (six) hours as needed for wheezing or shortness of breath.  Marland Kitchen aspirin EC 81 MG tablet Take 81 mg by mouth daily.  Marland Kitchen aspirin-acetaminophen-caffeine (EXCEDRIN MIGRAINE) 250-250-65 MG tablet Take by mouth every 6 (six) hours as needed for headache.  Marland Kitchen atenolol (TENORMIN) 50 MG tablet Take 50 mg by mouth 2 (two) times daily.  Marland Kitchen atorvastatin (LIPITOR) 40 MG tablet Take 20 mg by mouth at bedtime.   . benzonatate (TESSALON) 200 MG capsule Take 1 capsule (200 mg total) by mouth 3 (three) times daily as needed for cough (use 1st).  . gabapentin (NEURONTIN) 300 MG capsule Take 300 mg by mouth at bedtime.   Marland Kitchen glipiZIDE (GLUCOTROL) 10 MG tablet Take 1 tablet (10 mg total) by mouth 2 (two) times daily before a meal.  . guaiFENesin (MUCINEX) 600 MG 12 hr tablet Take 1 tablet (600 mg total) by mouth 2 (two) times daily.  . nicotine (NICODERM CQ - DOSED IN MG/24 HOURS) 21 mg/24hr patch Place 1 patch (21 mg total) onto the skin daily.  . pioglitazone (ACTOS) 15 MG tablet Take 1 tablet  (15 mg total) by mouth daily.  . sertraline (ZOLOFT) 50 MG tablet Take 50 mg by mouth daily.  . traMADol (ULTRAM) 50 MG tablet Take 1 tablet (50 mg total) by mouth 3 (three) times daily as needed for moderate pain (arthritis).  . traZODone (DESYREL) 50 MG tablet Take 50 mg by mouth at bedtime.  . [DISCONTINUED] umeclidinium-vilanterol (ANORO ELLIPTA) 62.5-25 MCG/INH AEPB Inhale 1 puff into the lungs daily.   No facility-administered encounter medications on file as of 03/13/2018.     Allergies as of 03/13/2018 - Review Complete 03/13/2018  Allergen Reaction Noted  . Clindamycin/lincomycin  07/28/2013  . Contrast media [iodinated diagnostic agents] Shortness Of Breath 02/13/2018  . Metformin and related Other (See Comments) 01/14/2015    Past Medical History:  Diagnosis Date  . Anxiety   . Arthritis    back, right hip  . Chronic kidney disease    stage 3  . Diabetes mellitus without complication ([place])   . Dyspnea   . Heart murmur   . Hyperlipidemia   . Hypertension   . Neuropathy   . Peripheral vascular disease ([place])    Carotid artery stenosis, s/p bilat stents  . Skin cancer    basal cell right cheek, left ear  . Stroke [facility])    noted on MRI    Past Surgical History:  Procedure Laterality Date  . ENDARTERECTOMY Left 02/21/2017   Procedure: LEFT CAROTID ENDARTERECTOMY;  Surgeon: Donnetta Hutching,  Arvilla Meres, MD;  Location: [place];  Service: Vascular;  Laterality: Left;  . lower extremity stenting    . PATCH ANGIOPLASTY Left 02/21/2017   Procedure: PATCH ANGIOPLASTY OF LEFT CAROTID ARTERY USING HEMASHIELD PLATINUM FINESSE PATCH;  Surgeon: Rosetta Posner, MD;  Location: [place];  Service: Vascular;  Laterality: Left;    No family history on file.  Social History   Socioeconomic History  . Marital status: Single    Spouse name: Not on file  . Number of children: 2  . Years of education: Not on file  . Highest education level: Not on file  Occupational History    Comment: retired  Photographer  . Financial resource strain: Patient refused  . Food insecurity:    Worry: Patient refused    Inability: Patient refused  . Transportation needs:    Medical: No    Non-medical: No  Tobacco Use  . Smoking status: Current Every Day Smoker    Years: 20.00    Types: Cigarettes  . Smokeless tobacco: Never Used  . Tobacco comment: Less than 1/2 pk per day  Substance and Sexual Activity  . Alcohol use: No    Comment: heavy drinker in past, none for 20 years  . Drug use: No  . Sexual activity: Not Currently  Lifestyle  . Physical activity:    Days per week: Patient refused    Minutes per session: Patient refused  . Stress: Only a little  Relationships  . Social connections:    Talks on phone: Patient refused    Gets together: Patient refused    Attends religious service: Patient refused    Active member of club or organization: Patient refused    Attends meetings of clubs or organizations: Patient refused    Relationship status: Patient refused  . Intimate partner violence:    Fear of current or ex partner: No    Emotionally abused: No    Physically abused: No    Forced sexual activity: No  Other Topics Concern  . Not on file  Social History Narrative  . Not on file    Review of Systems  Constitutional: Negative.   HENT: Negative.  Negative for postnasal drip.   Eyes: Negative.   Respiratory: Positive for shortness of breath. Negative for cough, choking and chest tightness.   Cardiovascular: Negative.  Negative for leg swelling.  Gastrointestinal: Negative.   All other systems reviewed and are negative.   Vitals:   03/13/18 1350  BP: 110/78  Pulse: 70  SpO2: 96%     Physical Exam  Constitutional: He appears well-developed and well-nourished.  HENT:  Head: Normocephalic and atraumatic.  Eyes: Pupils are equal, round, and reactive to light. Conjunctivae and EOM are normal. Right eye exhibits no discharge. Left eye exhibits no discharge.  Neck: Normal  range of motion. Neck supple. No tracheal deviation present. No thyromegaly present.  Cardiovascular: Normal rate and regular rhythm.  Pulmonary/Chest: Effort normal and breath sounds normal. No respiratory distress. He has no wheezes. He has no rales. He exhibits no tenderness.  Abdominal: Soft. Bowel sounds are normal. He exhibits no distension. There is no abdominal tenderness. There is no rebound.   Data Reviewed: Recent chest x-ray reviewed by myself showing increased markings, no acute infiltrate Hyperinflated lung fields  Assessment:  COPD with recent exacerbation Hypoxemic respiratory failure  Plan/Recommendations:  Obtain a pulmonary function study  Obtain an exercise oximetry to ascertain with the patient requires oxygen supplementation  We  will place him on Bevespi-preferred medication on his plan  Rescue inhaler use as needed  Anoro that was attempted is too expensive for him to afford at over $400 a month  The importance of quitting smoking discussed with the patient, currently on a nicotine patch encouraged to strongly work on quitting  Sherrilyn Rist MD [facility] 03/13/2018, 2:10 PM  CC: [name], MD

## 2018-03-13 NOTE — Patient Instructions (Addendum)
Inhalers as prescribed-we will try Bevespi as this is on formulary for your carrier  Encourage you to increase physical activity  We will get a breathing study, exercise oximetry  I will see you back in the office in about 6 weeks

## 2018-05-05 ENCOUNTER — Ambulatory Visit: Payer: Medicare Other | Admitting: Pulmonary Disease

## 2018-06-11 DIAGNOSIS — I10 Essential (primary) hypertension: Secondary | ICD-10-CM | POA: Diagnosis not present

## 2018-06-11 DIAGNOSIS — G4700 Insomnia, unspecified: Secondary | ICD-10-CM | POA: Diagnosis not present

## 2018-06-11 DIAGNOSIS — F321 Major depressive disorder, single episode, moderate: Secondary | ICD-10-CM | POA: Diagnosis not present

## 2018-06-11 DIAGNOSIS — G894 Chronic pain syndrome: Secondary | ICD-10-CM | POA: Diagnosis not present

## 2018-06-11 DIAGNOSIS — F411 Generalized anxiety disorder: Secondary | ICD-10-CM | POA: Diagnosis not present

## 2018-06-11 DIAGNOSIS — E1151 Type 2 diabetes mellitus with diabetic peripheral angiopathy without gangrene: Secondary | ICD-10-CM | POA: Diagnosis not present

## 2018-06-11 DIAGNOSIS — E1140 Type 2 diabetes mellitus with diabetic neuropathy, unspecified: Secondary | ICD-10-CM | POA: Diagnosis not present

## 2018-06-11 DIAGNOSIS — J449 Chronic obstructive pulmonary disease, unspecified: Secondary | ICD-10-CM | POA: Diagnosis not present

## 2018-06-11 DIAGNOSIS — N289 Disorder of kidney and ureter, unspecified: Secondary | ICD-10-CM | POA: Diagnosis not present

## 2018-06-11 DIAGNOSIS — E785 Hyperlipidemia, unspecified: Secondary | ICD-10-CM | POA: Diagnosis not present

## 2018-06-23 DIAGNOSIS — E1151 Type 2 diabetes mellitus with diabetic peripheral angiopathy without gangrene: Secondary | ICD-10-CM | POA: Diagnosis not present

## 2018-06-23 DIAGNOSIS — N289 Disorder of kidney and ureter, unspecified: Secondary | ICD-10-CM | POA: Diagnosis not present

## 2018-07-20 ENCOUNTER — Ambulatory Visit: Payer: Medicare Other | Admitting: Pulmonary Disease

## 2018-08-07 ENCOUNTER — Other Ambulatory Visit: Payer: Self-pay | Admitting: Pulmonary Disease

## 2018-08-10 ENCOUNTER — Other Ambulatory Visit: Payer: Self-pay

## 2018-08-10 ENCOUNTER — Ambulatory Visit (INDEPENDENT_AMBULATORY_CARE_PROVIDER_SITE_OTHER): Payer: Medicare Other | Admitting: Pulmonary Disease

## 2018-08-10 ENCOUNTER — Encounter: Payer: Self-pay | Admitting: Pulmonary Disease

## 2018-08-10 VITALS — BP 136/64 | HR 68 | Temp 98.0°F | Ht 69.0 in | Wt 177.4 lb

## 2018-08-10 DIAGNOSIS — J449 Chronic obstructive pulmonary disease, unspecified: Secondary | ICD-10-CM

## 2018-08-10 MED ORDER — ALBUTEROL SULFATE HFA 108 (90 BASE) MCG/ACT IN AERS: 2.0000 | INHALATION_SPRAY | Freq: Four times a day (QID) | RESPIRATORY_TRACT | 11 refills | Status: AC | PRN

## 2018-08-10 MED ORDER — BEVESPI AEROSPHERE 9-4.8 MCG/ACT IN AERO: 2.0000 | INHALATION_SPRAY | Freq: Two times a day (BID) | RESPIRATORY_TRACT | 0 refills | Status: AC

## 2018-08-10 NOTE — Patient Instructions (Signed)
COPD-stable  Bevespi as is being used currently Prescription for albuterol Regular graded exercises  I will see you back in the office in 6 months Call with significant concerns

## 2018-08-10 NOTE — Addendum Note (Signed)
Addended by: Elton Sin on: 08/10/2018 01:27 PM   Modules accepted: Orders

## 2018-08-10 NOTE — Progress Notes (Signed)
Adrian Andrews    [id number]    23-Feb-1950  Primary Care Physician:White, Caren Griffins, MD  Referring Physician: [name], MD [name],   [id number]  Chief complaint:   History of obstructive lung disease  HPI: Patient with COPD with recent exacerbation Recently quit smoking Shortness of breath is better  Uses albuterol as needed Bevespi as controller medication-has not been able to afford it-benefit from samples provided  He gets short of breath with activity Past history of CVA  He has multiple comorbidities including diabetes, hypertension, hyperlipidemia, peripheral vascular disease,  He is short of breath with mild activity  He was recently discharged home from the hospital with oxygen supplementation  Outpatient Encounter Medications as of 08/10/2018  Medication Sig  . albuterol (PROVENTIL HFA;VENTOLIN HFA) 108 (90 Base) MCG/ACT inhaler Inhale 2 puffs into the lungs every 6 (six) hours as needed for wheezing or shortness of breath.  Marland Kitchen aspirin EC 81 MG tablet Take 81 mg by mouth daily.  Marland Kitchen aspirin-acetaminophen-caffeine (EXCEDRIN MIGRAINE) 250-250-65 MG tablet Take by mouth every 6 (six) hours as needed for headache.  Marland Kitchen atenolol (TENORMIN) 50 MG tablet Take 50 mg by mouth 2 (two) times daily.  Marland Kitchen atorvastatin (LIPITOR) 40 MG tablet Take 20 mg by mouth at bedtime.   . gabapentin (NEURONTIN) 300 MG capsule Take 300 mg by mouth at bedtime.   Marland Kitchen glipiZIDE (GLUCOTROL) 10 MG tablet Take 1 tablet (10 mg total) by mouth 2 (two) times daily before a meal.  . guaiFENesin (MUCINEX) 600 MG 12 hr tablet Take 1 tablet (600 mg total) by mouth 2 (two) times daily.  . pioglitazone (ACTOS) 15 MG tablet Take 1 tablet (15 mg total) by mouth daily.  . sertraline (ZOLOFT) 50 MG tablet Take 50 mg by mouth daily.  . traMADol (ULTRAM) 50 MG tablet Take 1 tablet (50 mg total) by mouth 3 (three) times daily as needed for moderate pain (arthritis).  .  traZODone (DESYREL) 50 MG tablet Take 50 mg by mouth at bedtime.  . Glycopyrrolate-Formoterol (BEVESPI AEROSPHERE) 9-4.8 MCG/ACT AERO Inhale 2 puffs into the lungs 2 (two) times daily. (Patient not taking: Reported on 08/10/2018)  . [DISCONTINUED] benzonatate (TESSALON) 200 MG capsule Take 1 capsule (200 mg total) by mouth 3 (three) times daily as needed for cough (use 1st).   No facility-administered encounter medications on file as of 08/10/2018.     Allergies as of 08/10/2018 - Review Complete 08/10/2018  Allergen Reaction Noted  . Clindamycin/lincomycin  07/28/2013  . Contrast media [iodinated diagnostic agents] Shortness Of Breath 02/13/2018  . Metformin and related Other (See Comments) 01/14/2015    Past Medical History:  Diagnosis Date  . Anxiety   . Arthritis    back, right hip  . Chronic kidney disease    stage 3  . Diabetes mellitus without complication ([place])   . Dyspnea   . Heart murmur   . Hyperlipidemia   . Hypertension   . Neuropathy   . Peripheral vascular disease ([place])    Carotid artery stenosis, s/p bilat stents  . Skin cancer    basal cell right cheek, left ear  . Stroke [facility])    noted on MRI    Past Surgical History:  Procedure Laterality Date  . ENDARTERECTOMY Left 02/21/2017   Procedure: LEFT CAROTID ENDARTERECTOMY;  Surgeon: Rosetta Posner, MD;  Location: [place];  Service: Vascular;  Laterality: Left;  . lower extremity  stenting    . PATCH ANGIOPLASTY Left 02/21/2017   Procedure: PATCH ANGIOPLASTY OF LEFT CAROTID ARTERY USING HEMASHIELD PLATINUM FINESSE PATCH;  Surgeon: Rosetta Posner, MD;  Location: [place];  Service: Vascular;  Laterality: Left;    No family history on file.  Social History   Socioeconomic History  . Marital status: Single    Spouse name: Not on file  . Number of children: 2  . Years of education: Not on file  . Highest education level: Not on file  Occupational History    Comment: retired  Scientific laboratory technician  . Financial resource  strain: Patient refused  . Food insecurity    Worry: Patient refused    Inability: Patient refused  . Transportation needs    Medical: No    Non-medical: No  Tobacco Use  . Smoking status: Former Smoker    Packs/day: 2.00    Years: 20.00    Pack years: 40.00    Types: Cigarettes    Quit date: 04/2018    Years since quitting: 0.3  . Smokeless tobacco: Never Used  Substance and Sexual Activity  . Alcohol use: No    Comment: heavy drinker in past, none for 20 years  . Drug use: No  . Sexual activity: Not Currently  Lifestyle  . Physical activity    Days per week: Patient refused    Minutes per session: Patient refused  . Stress: Only a little  Relationships  . Social Herbalist on phone: Patient refused    Gets together: Patient refused    Attends religious service: Patient refused    Active member of club or organization: Patient refused    Attends meetings of clubs or organizations: Patient refused    Relationship status: Patient refused  . Intimate partner violence    Fear of current or ex partner: No    Emotionally abused: No    Physically abused: No    Forced sexual activity: No  Other Topics Concern  . Not on file  Social History Narrative  . Not on file    Review of Systems  Constitutional: Negative.   HENT: Negative.  Negative for postnasal drip.   Eyes: Negative.   Respiratory: Negative for cough, choking, chest tightness, wheezing and stridor.   Cardiovascular: Positive for leg swelling.  Gastrointestinal: Negative.   Musculoskeletal: Negative.   All other systems reviewed and are negative.   Vitals:   08/10/18 1157  BP: 136/64  Pulse: 68  Temp: 98 F (36.7 C)  SpO2: 93%     Physical Exam  Constitutional: He is oriented to person, place, and time. He appears well-developed and well-nourished.  HENT:  Head: Normocephalic and atraumatic.  Eyes: Pupils are equal, round, and reactive to light. Conjunctivae and EOM are normal. Right  eye exhibits no discharge. Left eye exhibits no discharge.  Neck: Normal range of motion. Neck supple. No tracheal deviation present. No thyromegaly present.  Cardiovascular: Normal rate and regular rhythm.  Pulmonary/Chest: Effort normal and breath sounds normal. No respiratory distress. He has no wheezes.  Abdominal: Soft. Bowel sounds are normal. He exhibits no distension. There is no abdominal tenderness. There is no rebound.  Musculoskeletal: Normal range of motion.        General: Edema present.  Neurological: He is alert and oriented to person, place, and time.  Skin: Skin is warm and dry.  Psychiatric: He has a normal mood and affect.   Data Reviewed: Chest x-ray reviewed showing  no acute infiltrate, hyperinflated lung fields  Assessment:   COPD  History of hypoxemic respiratory failure-currently off oxygen  Deconditioning  Plan/Recommendations:  . Encouraged to stay active  Graded exercises as tolerated   Continue Bevespi  Rescue inhaler as needed  Commended on quitting smoking  Encouraged to call with any significant concerns   Sherrilyn Rist MD [facility] 08/10/2018, 12:14 PM  CC: [name], MD

## 2018-08-13 ENCOUNTER — Other Ambulatory Visit: Payer: Self-pay

## 2018-08-13 ENCOUNTER — Other Ambulatory Visit (HOSPITAL_COMMUNITY)
Admission: RE | Admit: 2018-08-13 | Discharge: 2018-08-13 | Disposition: A | Discharge status: Home / Self Care | Payer: Medicare Other | Source: Ambulatory Visit | Attending: Pulmonary Disease | Admitting: Pulmonary Disease

## 2018-08-13 ENCOUNTER — Telehealth: Payer: Self-pay | Admitting: Pulmonary Disease

## 2018-08-13 DIAGNOSIS — Z20822 Contact with and (suspected) exposure to covid-19: Secondary | ICD-10-CM

## 2018-08-13 DIAGNOSIS — R6889 Other general symptoms and signs: Secondary | ICD-10-CM | POA: Diagnosis not present

## 2018-08-13 NOTE — Telephone Encounter (Signed)
DISREGUARD MSG.Hillery Hunter

## 2018-08-13 NOTE — Progress Notes (Signed)
I spoke with Mr Popp to inquire his arrival for Covid 19 testing today. He stated he tested at the community testing site at [facility] and is aware his results may not be complete before his procedure on 8/3. He stated he talked to the office about  his PFT and if results not complete, his PFT will be rescheduled for 8/5.

## 2018-08-14 ENCOUNTER — Other Ambulatory Visit (HOSPITAL_COMMUNITY)
Admission: RE | Admit: 2018-08-14 | Discharge: 2018-08-14 | Disposition: A | Discharge status: Home / Self Care | Payer: Medicare Other | Source: Ambulatory Visit | Attending: Pulmonary Disease | Admitting: Pulmonary Disease

## 2018-08-14 DIAGNOSIS — Z01812 Encounter for preprocedural laboratory examination: Secondary | ICD-10-CM | POA: Diagnosis not present

## 2018-08-14 DIAGNOSIS — Z20828 Contact with and (suspected) exposure to other viral communicable diseases: Secondary | ICD-10-CM | POA: Diagnosis not present

## 2018-08-14 LAB — SARS CORONAVIRUS 2 (TAT 6-24 HRS): SARS Coronavirus 2: NEGATIVE

## 2018-08-15 LAB — NOVEL CORONAVIRUS, NAA: SARS-CoV-2, NAA: NOT DETECTED

## 2018-08-17 ENCOUNTER — Encounter (HOSPITAL_COMMUNITY): Payer: Medicare Other

## 2018-08-18 ENCOUNTER — Other Ambulatory Visit: Payer: Self-pay | Admitting: Pulmonary Disease

## 2018-08-18 DIAGNOSIS — J449 Chronic obstructive pulmonary disease, unspecified: Secondary | ICD-10-CM

## 2018-08-19 ENCOUNTER — Other Ambulatory Visit: Payer: Self-pay

## 2018-08-19 ENCOUNTER — Ambulatory Visit (INDEPENDENT_AMBULATORY_CARE_PROVIDER_SITE_OTHER): Payer: Medicare Other | Admitting: Pulmonary Disease

## 2018-08-19 DIAGNOSIS — J449 Chronic obstructive pulmonary disease, unspecified: Secondary | ICD-10-CM | POA: Diagnosis not present

## 2018-08-19 LAB — PULMONARY FUNCTION TEST
DL/VA % pred: 32 %
DL/VA: 1.31 ml/min/mmHg/L
DLCO unc % pred: 31 %
DLCO unc: 8.04 ml/min/mmHg
FEF 25-75 Post: 0.83 L/sec
FEF 25-75 Pre: 0.54 L/sec
FEF2575-%Change-Post: 54 %
FEF2575-%Pred-Post: 34 %
FEF2575-%Pred-Pre: 22 %
FEV1-%Change-Post: 15 %
FEV1-%Pred-Post: 55 %
FEV1-%Pred-Pre: 47 %
FEV1-Post: 1.76 L
FEV1-Pre: 1.52 L
FEV1FVC-%Change-Post: 4 %
FEV1FVC-%Pred-Pre: 60 %
FEV6-%Change-Post: 16 %
FEV6-%Pred-Post: 89 %
FEV6-%Pred-Pre: 77 %
FEV6-Post: 3.63 L
FEV6-Pre: 3.12 L
FEV6FVC-%Change-Post: 4 %
FEV6FVC-%Pred-Post: 101 %
FEV6FVC-%Pred-Pre: 96 %
FVC-%Change-Post: 11 %
FVC-%Pred-Post: 88 %
FVC-%Pred-Pre: 79 %
FVC-Post: 3.81 L
FVC-Pre: 3.42 L
Post FEV1/FVC ratio: 46 %
Post FEV6/FVC ratio: 95 %
Pre FEV1/FVC ratio: 44 %
Pre FEV6/FVC Ratio: 91 %
RV % pred: 121 %
RV: 2.88 L
TLC % pred: 99 %
TLC: 6.81 L

## 2018-08-19 NOTE — Progress Notes (Signed)
PFT done today. 

## 2018-08-20 ENCOUNTER — Encounter (HOSPITAL_COMMUNITY): Payer: Medicare Other

## 2018-09-01 DIAGNOSIS — E1151 Type 2 diabetes mellitus with diabetic peripheral angiopathy without gangrene: Secondary | ICD-10-CM | POA: Diagnosis not present

## 2018-09-01 DIAGNOSIS — G894 Chronic pain syndrome: Secondary | ICD-10-CM | POA: Diagnosis not present

## 2018-09-01 DIAGNOSIS — G4700 Insomnia, unspecified: Secondary | ICD-10-CM | POA: Diagnosis not present

## 2018-09-01 DIAGNOSIS — I10 Essential (primary) hypertension: Secondary | ICD-10-CM | POA: Diagnosis not present

## 2018-09-01 DIAGNOSIS — E1140 Type 2 diabetes mellitus with diabetic neuropathy, unspecified: Secondary | ICD-10-CM | POA: Diagnosis not present

## 2018-09-01 DIAGNOSIS — E785 Hyperlipidemia, unspecified: Secondary | ICD-10-CM | POA: Diagnosis not present

## 2018-09-01 DIAGNOSIS — F411 Generalized anxiety disorder: Secondary | ICD-10-CM | POA: Diagnosis not present

## 2018-09-10 ENCOUNTER — Encounter (HOSPITAL_COMMUNITY): Payer: Self-pay | Admitting: Emergency Medicine

## 2018-09-10 ENCOUNTER — Emergency Department (HOSPITAL_COMMUNITY): Payer: Medicare Other

## 2018-09-10 ENCOUNTER — Other Ambulatory Visit: Payer: Self-pay

## 2018-09-10 ENCOUNTER — Inpatient Hospital Stay (HOSPITAL_COMMUNITY)
Admission: EM | Admit: 2018-09-10 | Discharge: 2018-09-28 | DRG: 871 | Disposition: A | Discharge status: Skilled Nursing Facility | Payer: Medicare Other | Attending: Internal Medicine | Admitting: Internal Medicine

## 2018-09-10 DIAGNOSIS — N179 Acute kidney failure, unspecified: Secondary | ICD-10-CM | POA: Diagnosis present

## 2018-09-10 DIAGNOSIS — E1169 Type 2 diabetes mellitus with other specified complication: Secondary | ICD-10-CM | POA: Diagnosis not present

## 2018-09-10 DIAGNOSIS — I10 Essential (primary) hypertension: Secondary | ICD-10-CM | POA: Diagnosis not present

## 2018-09-10 DIAGNOSIS — D539 Nutritional anemia, unspecified: Secondary | ICD-10-CM | POA: Diagnosis present

## 2018-09-10 DIAGNOSIS — E785 Hyperlipidemia, unspecified: Secondary | ICD-10-CM | POA: Diagnosis present

## 2018-09-10 DIAGNOSIS — R197 Diarrhea, unspecified: Secondary | ICD-10-CM | POA: Diagnosis present

## 2018-09-10 DIAGNOSIS — I959 Hypotension, unspecified: Secondary | ICD-10-CM | POA: Diagnosis not present

## 2018-09-10 DIAGNOSIS — I130 Hypertensive heart and chronic kidney disease with heart failure and stage 1 through stage 4 chronic kidney disease, or unspecified chronic kidney disease: Secondary | ICD-10-CM | POA: Diagnosis present

## 2018-09-10 DIAGNOSIS — Z888 Allergy status to other drugs, medicaments and biological substances status: Secondary | ICD-10-CM

## 2018-09-10 DIAGNOSIS — J9601 Acute respiratory failure with hypoxia: Secondary | ICD-10-CM | POA: Diagnosis not present

## 2018-09-10 DIAGNOSIS — R918 Other nonspecific abnormal finding of lung field: Secondary | ICD-10-CM | POA: Diagnosis not present

## 2018-09-10 DIAGNOSIS — I472 Ventricular tachycardia: Secondary | ICD-10-CM | POA: Diagnosis present

## 2018-09-10 DIAGNOSIS — J441 Chronic obstructive pulmonary disease with (acute) exacerbation: Secondary | ICD-10-CM | POA: Diagnosis present

## 2018-09-10 DIAGNOSIS — R0902 Hypoxemia: Secondary | ICD-10-CM

## 2018-09-10 DIAGNOSIS — E1122 Type 2 diabetes mellitus with diabetic chronic kidney disease: Secondary | ICD-10-CM | POA: Diagnosis not present

## 2018-09-10 DIAGNOSIS — F1411 Cocaine abuse, in remission: Secondary | ICD-10-CM | POA: Diagnosis present

## 2018-09-10 DIAGNOSIS — Z882 Allergy status to sulfonamides status: Secondary | ICD-10-CM

## 2018-09-10 DIAGNOSIS — J440 Chronic obstructive pulmonary disease with acute lower respiratory infection: Secondary | ICD-10-CM | POA: Diagnosis present

## 2018-09-10 DIAGNOSIS — M545 Low back pain: Secondary | ICD-10-CM | POA: Diagnosis present

## 2018-09-10 DIAGNOSIS — F419 Anxiety disorder, unspecified: Secondary | ICD-10-CM | POA: Diagnosis present

## 2018-09-10 DIAGNOSIS — J449 Chronic obstructive pulmonary disease, unspecified: Secondary | ICD-10-CM | POA: Diagnosis not present

## 2018-09-10 DIAGNOSIS — Z881 Allergy status to other antibiotic agents status: Secondary | ICD-10-CM | POA: Diagnosis not present

## 2018-09-10 DIAGNOSIS — E1151 Type 2 diabetes mellitus with diabetic peripheral angiopathy without gangrene: Secondary | ICD-10-CM | POA: Diagnosis present

## 2018-09-10 DIAGNOSIS — F411 Generalized anxiety disorder: Secondary | ICD-10-CM | POA: Diagnosis not present

## 2018-09-10 DIAGNOSIS — E1140 Type 2 diabetes mellitus with diabetic neuropathy, unspecified: Secondary | ICD-10-CM | POA: Diagnosis present

## 2018-09-10 DIAGNOSIS — R17 Unspecified jaundice: Secondary | ICD-10-CM | POA: Diagnosis not present

## 2018-09-10 DIAGNOSIS — K5900 Constipation, unspecified: Secondary | ICD-10-CM | POA: Diagnosis not present

## 2018-09-10 DIAGNOSIS — E1165 Type 2 diabetes mellitus with hyperglycemia: Secondary | ICD-10-CM | POA: Diagnosis not present

## 2018-09-10 DIAGNOSIS — A419 Sepsis, unspecified organism: Secondary | ICD-10-CM | POA: Diagnosis not present

## 2018-09-10 DIAGNOSIS — I82409 Acute embolism and thrombosis of unspecified deep veins of unspecified lower extremity: Secondary | ICD-10-CM | POA: Diagnosis not present

## 2018-09-10 DIAGNOSIS — I5033 Acute on chronic diastolic (congestive) heart failure: Secondary | ICD-10-CM | POA: Diagnosis not present

## 2018-09-10 DIAGNOSIS — J984 Other disorders of lung: Secondary | ICD-10-CM | POA: Diagnosis not present

## 2018-09-10 DIAGNOSIS — D638 Anemia in other chronic diseases classified elsewhere: Secondary | ICD-10-CM | POA: Diagnosis present

## 2018-09-10 DIAGNOSIS — J189 Pneumonia, unspecified organism: Secondary | ICD-10-CM | POA: Diagnosis not present

## 2018-09-10 DIAGNOSIS — R6520 Severe sepsis without septic shock: Secondary | ICD-10-CM | POA: Diagnosis not present

## 2018-09-10 DIAGNOSIS — Z66 Do not resuscitate: Secondary | ICD-10-CM | POA: Diagnosis present

## 2018-09-10 DIAGNOSIS — D696 Thrombocytopenia, unspecified: Secondary | ICD-10-CM | POA: Diagnosis present

## 2018-09-10 DIAGNOSIS — E861 Hypovolemia: Secondary | ICD-10-CM | POA: Diagnosis present

## 2018-09-10 DIAGNOSIS — J188 Other pneumonia, unspecified organism: Secondary | ICD-10-CM | POA: Diagnosis present

## 2018-09-10 DIAGNOSIS — I110 Hypertensive heart disease with heart failure: Secondary | ICD-10-CM | POA: Diagnosis not present

## 2018-09-10 DIAGNOSIS — M2550 Pain in unspecified joint: Secondary | ICD-10-CM | POA: Diagnosis not present

## 2018-09-10 DIAGNOSIS — J849 Interstitial pulmonary disease, unspecified: Secondary | ICD-10-CM | POA: Diagnosis not present

## 2018-09-10 DIAGNOSIS — R7989 Other specified abnormal findings of blood chemistry: Secondary | ICD-10-CM | POA: Diagnosis not present

## 2018-09-10 DIAGNOSIS — N183 Chronic kidney disease, stage 3 unspecified: Secondary | ICD-10-CM | POA: Diagnosis present

## 2018-09-10 DIAGNOSIS — Z7401 Bed confinement status: Secondary | ICD-10-CM | POA: Diagnosis not present

## 2018-09-10 DIAGNOSIS — D62 Acute posthemorrhagic anemia: Secondary | ICD-10-CM | POA: Diagnosis not present

## 2018-09-10 DIAGNOSIS — R6521 Severe sepsis with septic shock: Secondary | ICD-10-CM

## 2018-09-10 DIAGNOSIS — G8929 Other chronic pain: Secondary | ICD-10-CM | POA: Diagnosis present

## 2018-09-10 DIAGNOSIS — Z20828 Contact with and (suspected) exposure to other viral communicable diseases: Secondary | ICD-10-CM | POA: Diagnosis present

## 2018-09-10 DIAGNOSIS — Z85828 Personal history of other malignant neoplasm of skin: Secondary | ICD-10-CM

## 2018-09-10 DIAGNOSIS — J90 Pleural effusion, not elsewhere classified: Secondary | ICD-10-CM | POA: Diagnosis not present

## 2018-09-10 DIAGNOSIS — Z7984 Long term (current) use of oral hypoglycemic drugs: Secondary | ICD-10-CM

## 2018-09-10 DIAGNOSIS — Z209 Contact with and (suspected) exposure to unspecified communicable disease: Secondary | ICD-10-CM | POA: Diagnosis not present

## 2018-09-10 DIAGNOSIS — R05 Cough: Secondary | ICD-10-CM | POA: Diagnosis not present

## 2018-09-10 DIAGNOSIS — J9621 Acute and chronic respiratory failure with hypoxia: Secondary | ICD-10-CM | POA: Diagnosis not present

## 2018-09-10 DIAGNOSIS — Z87891 Personal history of nicotine dependence: Secondary | ICD-10-CM

## 2018-09-10 DIAGNOSIS — F329 Major depressive disorder, single episode, unspecified: Secondary | ICD-10-CM | POA: Diagnosis not present

## 2018-09-10 DIAGNOSIS — R41841 Cognitive communication deficit: Secondary | ICD-10-CM | POA: Diagnosis present

## 2018-09-10 DIAGNOSIS — E876 Hypokalemia: Secondary | ICD-10-CM | POA: Diagnosis not present

## 2018-09-10 DIAGNOSIS — R0789 Other chest pain: Secondary | ICD-10-CM | POA: Diagnosis not present

## 2018-09-10 DIAGNOSIS — Z8249 Family history of ischemic heart disease and other diseases of the circulatory system: Secondary | ICD-10-CM

## 2018-09-10 DIAGNOSIS — R262 Difficulty in walking, not elsewhere classified: Secondary | ICD-10-CM | POA: Diagnosis present

## 2018-09-10 DIAGNOSIS — I252 Old myocardial infarction: Secondary | ICD-10-CM

## 2018-09-10 DIAGNOSIS — Z79891 Long term (current) use of opiate analgesic: Secondary | ICD-10-CM

## 2018-09-10 DIAGNOSIS — Z8673 Personal history of transient ischemic attack (TIA), and cerebral infarction without residual deficits: Secondary | ICD-10-CM | POA: Diagnosis not present

## 2018-09-10 DIAGNOSIS — Z79899 Other long term (current) drug therapy: Secondary | ICD-10-CM

## 2018-09-10 DIAGNOSIS — E871 Hypo-osmolality and hyponatremia: Secondary | ICD-10-CM | POA: Diagnosis not present

## 2018-09-10 DIAGNOSIS — R0602 Shortness of breath: Secondary | ICD-10-CM | POA: Diagnosis not present

## 2018-09-10 DIAGNOSIS — Z9981 Dependence on supplemental oxygen: Secondary | ICD-10-CM

## 2018-09-10 DIAGNOSIS — J181 Lobar pneumonia, unspecified organism: Secondary | ICD-10-CM | POA: Diagnosis not present

## 2018-09-10 DIAGNOSIS — Z91041 Radiographic dye allergy status: Secondary | ICD-10-CM | POA: Diagnosis not present

## 2018-09-10 DIAGNOSIS — R5381 Other malaise: Secondary | ICD-10-CM | POA: Diagnosis not present

## 2018-09-10 DIAGNOSIS — R509 Fever, unspecified: Secondary | ICD-10-CM | POA: Diagnosis not present

## 2018-09-10 DIAGNOSIS — Z7982 Long term (current) use of aspirin: Secondary | ICD-10-CM

## 2018-09-10 DIAGNOSIS — Z751 Person awaiting admission to adequate facility elsewhere: Secondary | ICD-10-CM

## 2018-09-10 DIAGNOSIS — M6281 Muscle weakness (generalized): Secondary | ICD-10-CM | POA: Diagnosis present

## 2018-09-10 DIAGNOSIS — F17211 Nicotine dependence, cigarettes, in remission: Secondary | ICD-10-CM | POA: Diagnosis not present

## 2018-09-10 DIAGNOSIS — T380X5A Adverse effect of glucocorticoids and synthetic analogues, initial encounter: Secondary | ICD-10-CM | POA: Diagnosis present

## 2018-09-10 DIAGNOSIS — R000 Tachycardia, unspecified: Secondary | ICD-10-CM | POA: Diagnosis not present

## 2018-09-10 HISTORY — DX: Chronic obstructive pulmonary disease, unspecified: J44.9

## 2018-09-10 LAB — PROTIME-INR
INR: 1.3 — ABNORMAL HIGH (ref 0.8–1.2)
Prothrombin Time: 16 seconds — ABNORMAL HIGH (ref 11.4–15.2)

## 2018-09-10 LAB — COMPREHENSIVE METABOLIC PANEL
ALT: 19 U/L (ref 0–44)
AST: 22 U/L (ref 15–41)
Albumin: 3 g/dL — ABNORMAL LOW (ref 3.5–5.0)
Alkaline Phosphatase: 50 U/L (ref 38–126)
Anion gap: 16 — ABNORMAL HIGH (ref 5–15)
BUN: 36 mg/dL — ABNORMAL HIGH (ref 8–23)
CO2: 20 mmol/L — ABNORMAL LOW (ref 22–32)
Calcium: 8.9 mg/dL (ref 8.9–10.3)
Chloride: 94 mmol/L — ABNORMAL LOW (ref 98–111)
Creatinine, Ser: 1.83 mg/dL — ABNORMAL HIGH (ref 0.61–1.24)
GFR calc Af Amer: 43 mL/min — ABNORMAL LOW (ref >60)
GFR calc non Af Amer: 37 mL/min — ABNORMAL LOW (ref >60)
Glucose, Bld: 276 mg/dL — ABNORMAL HIGH (ref 70–99)
Potassium: 4.7 mmol/L (ref 3.5–5.1)
Sodium: 130 mmol/L — ABNORMAL LOW (ref 135–145)
Total Bilirubin: 2.5 mg/dL — ABNORMAL HIGH (ref 0.3–1.2)
Total Protein: 6.3 g/dL — ABNORMAL LOW (ref 6.5–8.1)

## 2018-09-10 LAB — CBC WITH DIFFERENTIAL/PLATELET
Abs Immature Granulocytes: 0.04 10*3/uL (ref 0.00–0.07)
Basophils Absolute: 0 10*3/uL (ref 0.0–0.1)
Basophils Relative: 0 %
Eosinophils Absolute: 0 10*3/uL (ref 0.0–0.5)
Eosinophils Relative: 0 %
HCT: 33.3 % — ABNORMAL LOW (ref 39.0–52.0)
Hemoglobin: 11.2 g/dL — ABNORMAL LOW (ref 13.0–17.0)
Immature Granulocytes: 0 %
Lymphocytes Relative: 6 %
Lymphs Abs: 0.6 10*3/uL — ABNORMAL LOW (ref 0.7–4.0)
MCH: 32.8 pg (ref 26.0–34.0)
MCHC: 33.6 g/dL (ref 30.0–36.0)
MCV: 97.7 fL (ref 80.0–100.0)
Monocytes Absolute: 0.3 10*3/uL (ref 0.1–1.0)
Monocytes Relative: 3 %
Neutro Abs: 8.4 10*3/uL — ABNORMAL HIGH (ref 1.7–7.7)
Neutrophils Relative %: 91 %
Platelets: 111 10*3/uL — ABNORMAL LOW (ref 150–400)
RBC: 3.41 MIL/uL — ABNORMAL LOW (ref 4.22–5.81)
RDW: 14.6 % (ref 11.5–15.5)
WBC: 9.3 10*3/uL (ref 4.0–10.5)
nRBC: 0 % (ref 0.0–0.2)

## 2018-09-10 LAB — RESPIRATORY PANEL BY PCR

## 2018-09-10 LAB — PROCALCITONIN: Procalcitonin: 7.44 ng/mL

## 2018-09-10 LAB — LACTIC ACID, PLASMA
Lactic Acid, Venous: 2.8 mmol/L (ref 0.5–1.9)
Lactic Acid, Venous: 4 mmol/L (ref 0.5–1.9)
Lactic Acid, Venous: 4.5 mmol/L (ref 0.5–1.9)
Lactic Acid, Venous: 3.3 mmol/L (ref 0.5–1.9)

## 2018-09-10 LAB — GLUCOSE, CAPILLARY
Glucose-Capillary: 375 mg/dL — ABNORMAL HIGH (ref 70–99)
Glucose-Capillary: 281 mg/dL — ABNORMAL HIGH (ref 70–99)

## 2018-09-10 LAB — APTT: aPTT: 34 seconds (ref 24–36)

## 2018-09-10 LAB — HEMOGLOBIN A1C
Hgb A1c MFr Bld: 7.6 % — ABNORMAL HIGH (ref 4.8–5.6)
Mean Plasma Glucose: 171.42 mg/dL

## 2018-09-10 LAB — SARS CORONAVIRUS 2 BY RT PCR (HOSPITAL ORDER, PERFORMED IN ~~LOC~~ HOSPITAL LAB): SARS Coronavirus 2: NEGATIVE

## 2018-09-10 MED ORDER — TRAZODONE HCL 50 MG PO TABS
50.0000 mg | ORAL_TABLET | Freq: Every day | ORAL | Status: DC
  Administered 2018-09-10 – 2018-09-27 (×18): 50 mg via ORAL
  Filled 2018-09-10 (×18): qty 1

## 2018-09-10 MED ORDER — SODIUM CHLORIDE 0.9 % IV SOLN
2.0000 g | INTRAVENOUS | Status: DC
  Administered 2018-09-10 – 2018-09-12 (×3): 2 g via INTRAVENOUS
  Filled 2018-09-10 (×2): qty 2
  Filled 2018-09-10: qty 20

## 2018-09-10 MED ORDER — AEROCHAMBER PLUS FLO-VU LARGE MISC
Status: AC
  Administered 2018-09-10: 1
  Filled 2018-09-10: qty 1

## 2018-09-10 MED ORDER — IPRATROPIUM-ALBUTEROL 0.5-2.5 (3) MG/3ML IN SOLN
3.0000 mL | Freq: Four times a day (QID) | RESPIRATORY_TRACT | Status: DC
  Administered 2018-09-10 – 2018-09-12 (×7): 3 mL via RESPIRATORY_TRACT
  Filled 2018-09-10 (×9): qty 3

## 2018-09-10 MED ORDER — ATORVASTATIN CALCIUM 10 MG PO TABS
20.0000 mg | ORAL_TABLET | Freq: Every day | ORAL | Status: DC
  Administered 2018-09-10 – 2018-09-21 (×12): 20 mg via ORAL
  Filled 2018-09-10 (×7): qty 2
  Filled 2018-09-10: qty 1
  Filled 2018-09-10 (×4): qty 2

## 2018-09-10 MED ORDER — IPRATROPIUM BROMIDE HFA 17 MCG/ACT IN AERS
2.0000 | INHALATION_SPRAY | Freq: Once | RESPIRATORY_TRACT | Status: AC
  Administered 2018-09-10: 10:00:00 2 via RESPIRATORY_TRACT
  Filled 2018-09-10: qty 12.9

## 2018-09-10 MED ORDER — SODIUM CHLORIDE 0.9 % IV BOLUS
500.0000 mL | Freq: Once | INTRAVENOUS | Status: AC
  Administered 2018-09-10: 500 mL via INTRAVENOUS

## 2018-09-10 MED ORDER — GABAPENTIN 300 MG PO CAPS
300.0000 mg | ORAL_CAPSULE | Freq: Every day | ORAL | Status: DC
  Administered 2018-09-10: 300 mg via ORAL
  Filled 2018-09-10: qty 1

## 2018-09-10 MED ORDER — SODIUM CHLORIDE 0.9 % IV SOLN
500.0000 mg | INTRAVENOUS | Status: DC
  Administered 2018-09-10 – 2018-09-17 (×8): 500 mg via INTRAVENOUS
  Filled 2018-09-10 (×9): qty 500

## 2018-09-10 MED ORDER — ASPIRIN EC 81 MG PO TBEC
81.0000 mg | DELAYED_RELEASE_TABLET | Freq: Every day | ORAL | Status: DC
  Administered 2018-09-10 – 2018-09-28 (×19): 81 mg via ORAL
  Filled 2018-09-10 (×19): qty 1

## 2018-09-10 MED ORDER — SODIUM CHLORIDE 0.9 % IV BOLUS
1000.0000 mL | Freq: Once | INTRAVENOUS | Status: AC
  Administered 2018-09-10: 11:00:00 1000 mL via INTRAVENOUS

## 2018-09-10 MED ORDER — METHYLPREDNISOLONE SODIUM SUCC 125 MG IJ SOLR
60.0000 mg | Freq: Two times a day (BID) | INTRAMUSCULAR | Status: AC
  Administered 2018-09-10 – 2018-09-14 (×8): 60 mg via INTRAVENOUS
  Filled 2018-09-10 (×8): qty 2

## 2018-09-10 MED ORDER — AEROCHAMBER PLUS FLO-VU MISC
1.0000 | Freq: Once | Status: AC
  Administered 2018-09-10: 10:00:00 1
  Filled 2018-09-10: qty 1

## 2018-09-10 MED ORDER — SODIUM CHLORIDE 0.9 % IV BOLUS
1000.0000 mL | Freq: Once | INTRAVENOUS | Status: AC
  Administered 2018-09-10: 1000 mL via INTRAVENOUS

## 2018-09-10 MED ORDER — SODIUM CHLORIDE 0.9 % IV SOLN
INTRAVENOUS | Status: DC
  Administered 2018-09-10 – 2018-09-11 (×2): via INTRAVENOUS

## 2018-09-10 MED ORDER — TRAMADOL HCL 50 MG PO TABS
50.0000 mg | ORAL_TABLET | Freq: Three times a day (TID) | ORAL | Status: DC | PRN
  Administered 2018-09-10 – 2018-09-28 (×22): 50 mg via ORAL
  Filled 2018-09-10 (×24): qty 1

## 2018-09-10 MED ORDER — ALBUTEROL SULFATE HFA 108 (90 BASE) MCG/ACT IN AERS
8.0000 | INHALATION_SPRAY | Freq: Once | RESPIRATORY_TRACT | Status: AC
  Administered 2018-09-10: 8 via RESPIRATORY_TRACT
  Filled 2018-09-10: qty 6.7

## 2018-09-10 MED ORDER — INSULIN ASPART 100 UNIT/ML ~~LOC~~ SOLN
0.0000 [IU] | Freq: Every day | SUBCUTANEOUS | Status: DC
  Administered 2018-09-10: 3 [IU] via SUBCUTANEOUS
  Administered 2018-09-11: 4 [IU] via SUBCUTANEOUS
  Administered 2018-09-13: 3 [IU] via SUBCUTANEOUS
  Administered 2018-09-20 – 2018-09-22 (×2): 2 [IU] via SUBCUTANEOUS
  Administered 2018-09-23 – 2018-09-24 (×2): 3 [IU] via SUBCUTANEOUS
  Administered 2018-09-25: 22:00:00 2 [IU] via SUBCUTANEOUS
  Administered 2018-09-26 – 2018-09-27 (×2): 3 [IU] via SUBCUTANEOUS

## 2018-09-10 MED ORDER — ALBUTEROL SULFATE (2.5 MG/3ML) 0.083% IN NEBU
2.5000 mg | INHALATION_SOLUTION | RESPIRATORY_TRACT | Status: DC | PRN
  Administered 2018-09-11 – 2018-09-12 (×2): 2.5 mg via RESPIRATORY_TRACT
  Filled 2018-09-10 (×2): qty 3

## 2018-09-10 MED ORDER — LOPERAMIDE HCL 2 MG PO CAPS
2.0000 mg | ORAL_CAPSULE | ORAL | Status: DC | PRN
  Filled 2018-09-10: qty 1

## 2018-09-10 MED ORDER — MORPHINE SULFATE (PF) 2 MG/ML IV SOLN
2.0000 mg | Freq: Once | INTRAVENOUS | Status: AC
  Administered 2018-09-10: 2 mg via INTRAVENOUS
  Filled 2018-09-10: qty 1

## 2018-09-10 MED ORDER — ALBUTEROL SULFATE (2.5 MG/3ML) 0.083% IN NEBU: 3.0000 mL | INHALATION_SOLUTION | Freq: Four times a day (QID) | RESPIRATORY_TRACT | Status: DC | PRN

## 2018-09-10 MED ORDER — ENOXAPARIN SODIUM 40 MG/0.4ML ~~LOC~~ SOLN
40.0000 mg | SUBCUTANEOUS | Status: DC
  Administered 2018-09-10: 40 mg via SUBCUTANEOUS
  Filled 2018-09-10: qty 0.4

## 2018-09-10 MED ORDER — ACETAMINOPHEN 325 MG PO TABS
650.0000 mg | ORAL_TABLET | Freq: Once | ORAL | Status: AC
  Administered 2018-09-10: 09:00:00 650 mg via ORAL

## 2018-09-10 MED ORDER — ALUM & MAG HYDROXIDE-SIMETH 200-200-20 MG/5ML PO SUSP
30.0000 mL | Freq: Four times a day (QID) | ORAL | Status: DC | PRN
  Administered 2018-09-10 – 2018-09-18 (×6): 30 mL via ORAL
  Filled 2018-09-10 (×7): qty 30

## 2018-09-10 MED ORDER — GUAIFENESIN ER 600 MG PO TB12
600.0000 mg | ORAL_TABLET | Freq: Two times a day (BID) | ORAL | Status: DC
  Administered 2018-09-10 – 2018-09-28 (×37): 600 mg via ORAL
  Filled 2018-09-10 (×38): qty 1

## 2018-09-10 MED ORDER — SERTRALINE HCL 50 MG PO TABS
50.0000 mg | ORAL_TABLET | Freq: Every day | ORAL | Status: DC
  Administered 2018-09-10 – 2018-09-28 (×18): 50 mg via ORAL
  Filled 2018-09-10 (×19): qty 1

## 2018-09-10 MED ORDER — INSULIN ASPART 100 UNIT/ML ~~LOC~~ SOLN
0.0000 [IU] | Freq: Three times a day (TID) | SUBCUTANEOUS | Status: DC
  Administered 2018-09-10: 15 [IU] via SUBCUTANEOUS
  Administered 2018-09-11 (×2): 5 [IU] via SUBCUTANEOUS
  Administered 2018-09-11: 8 [IU] via SUBCUTANEOUS
  Administered 2018-09-12: 2 [IU] via SUBCUTANEOUS
  Administered 2018-09-12: 3 [IU] via SUBCUTANEOUS
  Administered 2018-09-12 – 2018-09-13 (×3): 2 [IU] via SUBCUTANEOUS
  Administered 2018-09-13 – 2018-09-14 (×2): 3 [IU] via SUBCUTANEOUS
  Administered 2018-09-14: 8 [IU] via SUBCUTANEOUS
  Administered 2018-09-14 – 2018-09-15 (×3): 3 [IU] via SUBCUTANEOUS
  Administered 2018-09-15: 5 [IU] via SUBCUTANEOUS
  Administered 2018-09-16: 2 [IU] via SUBCUTANEOUS
  Administered 2018-09-16: 5 [IU] via SUBCUTANEOUS
  Administered 2018-09-16: 2 [IU] via SUBCUTANEOUS
  Administered 2018-09-17 (×3): 3 [IU] via SUBCUTANEOUS
  Administered 2018-09-18: 2 [IU] via SUBCUTANEOUS
  Administered 2018-09-18: 3 [IU] via SUBCUTANEOUS
  Administered 2018-09-19 (×2): 5 [IU] via SUBCUTANEOUS
  Administered 2018-09-19 – 2018-09-20 (×3): 3 [IU] via SUBCUTANEOUS
  Administered 2018-09-20: 17:00:00 5 [IU] via SUBCUTANEOUS
  Administered 2018-09-21: 8 [IU] via SUBCUTANEOUS
  Administered 2018-09-21 – 2018-09-22 (×3): 3 [IU] via SUBCUTANEOUS
  Administered 2018-09-22: 08:00:00 2 [IU] via SUBCUTANEOUS
  Administered 2018-09-23: 5 [IU] via SUBCUTANEOUS
  Administered 2018-09-23: 3 [IU] via SUBCUTANEOUS

## 2018-09-10 NOTE — ED Triage Notes (Signed)
Pt arrives via EMS from home feeling bad for the last 2 days. Tried home inhaler use. Normally wears 2L of O2 at baseline for COPD. Has hx of pneumonia. Received 18g RFA, 0.3mg  epi, 2 grams mag sulfate, 125mg  solumedrol PTA. Pt alert, oriented x4. Pt reports nausea, no vomiting but had diarrhea. Fever to 101 at home.

## 2018-09-10 NOTE — ED Provider Notes (Addendum)
[facility] DEPARTMENT Provider Note   CSN: [id number] Arrival date & time: 09/10/18  [id number]     History   Chief Complaint Chief Complaint  Patient presents with  . Shortness of Breath  . Fever    HPI Adrian Andrews is a 68 y.o. male.     HPI  68 year old male presents with fever.  He has had diarrhea for a couple days.  That has slowed down since he started taking Imodium.  Temp at home was 101.  He has some cough and maybe a little shortness of breath though he does not feel that bad.  EMS noted he had O2 sats at 90% on room air.  Typically he wears 3 L.  He does not feel like he is wheezing.  He has some dysuria but states that is kind of chronic with his prostate issues.  No abdominal pain.  No specific COVID-19 contacts.  Past Medical History:  Diagnosis Date  . Anxiety   . Arthritis    back, right hip  . Chronic kidney disease    stage 3  . Diabetes mellitus without complication ([place])   . Dyspnea   . Heart murmur   . Hyperlipidemia   . Hypertension   . Neuropathy   . Peripheral vascular disease ([place])    Carotid artery stenosis, s/p bilat stents  . Skin cancer    basal cell right cheek, left ear  . Stroke [facility])    noted on MRI    Patient Active Problem List   Diagnosis Date Noted  . COPD with acute exacerbation ([place]) 02/14/2018  . Nicotine use disorder 02/14/2018  . Chronic kidney disease (CKD) stage II 02/14/2018  . COPD (chronic obstructive pulmonary disease) ([place]) 02/13/2018  . Lactic acidosis 02/13/2018  . Type 2 diabetes mellitus with other specified complication ([place]) [id number]  . Acute on chronic respiratory failure with hypoxia ([place]) 12/21/2017  . CAP (community acquired pneumonia) 12/21/2017  . AKI (acute kidney injury) ([place]) 12/21/2017  . COPD exacerbation ([place]) 12/20/2017  . Heart failure ([place]) 12/20/2017  . Carotid artery stenosis, symptomatic, left 02/21/2017    Past Surgical History:  Procedure Laterality Date   . ENDARTERECTOMY Left 02/21/2017   Procedure: LEFT CAROTID ENDARTERECTOMY;  Surgeon: Rosetta Posner, MD;  Location: [facility] OR;  Service: Vascular;  Laterality: Left;  . lower extremity stenting    . PATCH ANGIOPLASTY Left 02/21/2017   Procedure: PATCH ANGIOPLASTY OF LEFT CAROTID ARTERY USING HEMASHIELD PLATINUM FINESSE PATCH;  Surgeon: Rosetta Posner, MD;  Location: [facility] OR;  Service: Vascular;  Laterality: Left;        Home Medications    Prior to Admission medications   Medication Sig Start Date End Date Taking? Authorizing Provider  albuterol (VENTOLIN HFA) 108 (90 Base) MCG/ACT inhaler Inhale 2 puffs into the lungs every 6 (six) hours as needed for wheezing or shortness of breath. 08/10/18  Yes Olalere, Adewale A, MD  aspirin EC 81 MG tablet Take 81 mg by mouth daily.   Yes [provider]  aspirin-acetaminophen-caffeine (EXCEDRIN MIGRAINE) [phone] MG tablet Take by mouth every 6 (six) hours as needed for headache.   Yes [provider]  atenolol (TENORMIN) 50 MG tablet Take 50 mg by mouth 2 (two) times daily.   Yes [provider]  atorvastatin (LIPITOR) 40 MG tablet Take 20 mg by mouth at bedtime.    Yes [provider]  gabapentin (NEURONTIN) 300 MG capsule Take 300 mg by  mouth at bedtime.    Yes [provider]  glipiZIDE (GLUCOTROL) 10 MG tablet Take 1 tablet (10 mg total) by mouth 2 (two) times daily before a meal. 02/16/18  Yes Rai, Ripudeep K, MD  Glycopyrrolate-Formoterol (BEVESPI AEROSPHERE) 9-4.8 MCG/ACT AERO Inhale 2 puffs into the lungs 2 (two) times daily. 03/13/18  Yes Olalere, Adewale A, MD  Glycopyrrolate-Formoterol (BEVESPI AEROSPHERE) 9-4.8 MCG/ACT AERO Inhale 2 puffs into the lungs 2 (two) times daily. 08/10/18  Yes Olalere, Adewale A, MD  guaiFENesin (MUCINEX) 600 MG 12 hr tablet Take 1 tablet (600 mg total) by mouth 2 (two) times daily. 02/16/18  Yes Rai, Ripudeep K, MD  pioglitazone (ACTOS) 15 MG tablet Take 1 tablet (15 mg total) by  mouth daily. 02/16/18  Yes Rai, Ripudeep K, MD  sertraline (ZOLOFT) 50 MG tablet Take 50 mg by mouth daily.   Yes [provider]  traMADol (ULTRAM) 50 MG tablet Take 1 tablet (50 mg total) by mouth 3 (three) times daily as needed for moderate pain (arthritis). 02/21/17  Yes Rhyne, Hulen Shouts, PA-C  traZODone (DESYREL) 50 MG tablet Take 50 mg by mouth at bedtime. 01/29/18  Yes [provider]    Family History History reviewed. No pertinent family history.  Social History Social History   Tobacco Use  . Smoking status: Former Smoker    Packs/day: 2.00    Years: 20.00    Pack years: 40.00    Types: Cigarettes    Quit date: 04/2018    Years since quitting: 0.4  . Smokeless tobacco: Never Used  Substance Use Topics  . Alcohol use: No    Comment: heavy drinker in past, none for 20 years  . Drug use: No     Allergies   Clindamycin/lincomycin, Contrast media [iodinated diagnostic agents], and Metformin and related   Review of Systems Review of Systems  Constitutional: Positive for fever.  Respiratory: Positive for cough and shortness of breath.   Cardiovascular: Negative for chest pain.  Gastrointestinal: Positive for diarrhea. Negative for abdominal pain and vomiting.  Genitourinary: Positive for dysuria.  All other systems reviewed and are negative.    Physical Exam Updated Vital Signs BP (!) 106/58 (BP Location: Right Arm)   Pulse (!) 111   Temp 98.7 F (37.1 C) (Oral)   Resp 17   Ht 5\' 9"  (1.753 m)   Wt 79.4 kg   SpO2 95%   BMI 25.84 kg/m   Physical Exam Vitals signs and nursing note reviewed.  Constitutional:      Appearance: He is well-developed. He is not diaphoretic.  HENT:     Head: Normocephalic and atraumatic.     Right Ear: External ear normal.     Left Ear: External ear normal.     Nose: Nose normal.  Eyes:     General:        Right eye: No discharge.        Left eye: No discharge.  Neck:     Musculoskeletal: Neck supple.   Cardiovascular:     Rate and Rhythm: Regular rhythm. Tachycardia present.     Heart sounds: Normal heart sounds.  Pulmonary:     Effort: Tachypnea present. No accessory muscle usage.     Breath sounds: Decreased breath sounds and wheezing present.  Abdominal:     Palpations: Abdomen is soft.     Tenderness: There is no abdominal tenderness.  Musculoskeletal:     Right lower leg: Edema present.  Left lower leg: Edema present.     Comments: Trace pedal edema bilaterally  Skin:    General: Skin is warm and dry.  Neurological:     Mental Status: He is alert.  Psychiatric:        Mood and Affect: Mood is not anxious.      ED Treatments / Results  Labs (all labs ordered are listed, but only abnormal results are displayed) Labs Reviewed  LACTIC ACID, PLASMA - Abnormal; Notable for the following components:      Result Value   Lactic Acid, Venous 2.8 (*)    All other components within normal limits  COMPREHENSIVE METABOLIC PANEL - Abnormal; Notable for the following components:   Sodium 130 (*)    Chloride 94 (*)    CO2 20 (*)    Glucose, Bld 276 (*)    BUN 36 (*)    Creatinine, Ser 1.83 (*)    Total Protein 6.3 (*)    Albumin 3.0 (*)    Total Bilirubin 2.5 (*)    GFR calc non Af Amer 37 (*)    GFR calc Af Amer 43 (*)    Anion gap 16 (*)    All other components within normal limits  CBC WITH DIFFERENTIAL/PLATELET - Abnormal; Notable for the following components:   RBC 3.41 (*)    Hemoglobin 11.2 (*)    HCT 33.3 (*)    Platelets 111 (*)    Neutro Abs 8.4 (*)    Lymphs Abs 0.6 (*)    All other components within normal limits  PROTIME-INR - Abnormal; Notable for the following components:   Prothrombin Time 16.0 (*)    INR 1.3 (*)    All other components within normal limits  SARS CORONAVIRUS 2 (HOSPITAL ORDER, [facility])  CULTURE, BLOOD (ROUTINE X 2)  CULTURE, BLOOD (ROUTINE X 2)  URINE CULTURE  APTT  LACTIC ACID, PLASMA  URINALYSIS,  ROUTINE W REFLEX MICROSCOPIC    EKG EKG Interpretation  Date/Time:  Thursday September 10 2018 09:39:47 EDT Ventricular Rate:  92 PR Interval:    QRS Duration: 97 QT Interval:  381 QTC Calculation: 472 R Axis:   67 Text Interpretation:  Sinus rhythm Minimal ST depression, inferior leads HR a little slower, otherwise similar to Jan 2020 Confirmed by Sherwood Gambler [phone]) on 09/10/2018 10:22:37 AM   Radiology Dg Chest Port 1 View  Result Date: 09/10/2018 CLINICAL DATA:  68 year old male with cough and fever EXAM: PORTABLE CHEST 1 VIEW COMPARISON:  February 13, 2018 FINDINGS: Cardiomediastinal silhouette unchanged in size and contour. Interval development of airspace opacity at the right lung base, partially obscuring the right hemidiaphragm and the right heart border. No pneumothorax. No large pleural effusion. IMPRESSION: Right lobar pneumonia Electronically Signed   By: Corrie Mckusick D.O.   On: 09/10/2018 09:32    Procedures .Critical Care Performed by: Sherwood Gambler, MD Authorized by: Sherwood Gambler, MD   Critical care provider statement:    Critical care time (minutes):  30   Critical care time was exclusive of:  Separately billable procedures and treating other patients   Critical care was necessary to treat or prevent imminent or life-threatening deterioration of the following conditions:  Sepsis   Critical care was time spent personally by me on the following activities:  Discussions with consultants, evaluation of patient's response to treatment, examination of patient, ordering and performing treatments and interventions, ordering and review of laboratory studies, ordering and review of  radiographic studies, pulse oximetry, re-evaluation of patient's condition, obtaining history from patient or surrogate and review of old charts   (including critical care time)  Medications Ordered in ED Medications  cefTRIAXone (ROCEPHIN) 2 g in sodium chloride 0.9 % 100 mL IVPB (0 g  Intravenous Stopped 09/10/18 1014)  azithromycin (ZITHROMAX) 500 mg in sodium chloride 0.9 % 250 mL IVPB (0 mg Intravenous Stopped 09/10/18 1053)  acetaminophen (TYLENOL) tablet 650 mg (650 mg Oral Given 09/10/18 0904)  albuterol (VENTOLIN HFA) 108 (90 Base) MCG/ACT inhaler 8 puff (8 puffs Inhalation Given 09/10/18 0940)  ipratropium (ATROVENT HFA) inhaler 2 puff (2 puffs Inhalation Given 09/10/18 0953)  aerochamber plus with mask device 1 each (1 each Other Given 09/10/18 0946)  sodium chloride 0.9 % bolus 1,000 mL (0 mLs Intravenous Stopped 09/10/18 1053)  sodium chloride 0.9 % bolus 1,000 mL (1,000 mLs Intravenous New Bag/Given 09/10/18 1120)     Initial Impression / Assessment and Plan / ED Course  I have reviewed the triage vital signs and the nursing notes.  Pertinent labs & imaging results that were available during my care of the patient were reviewed by me and considered in my medical decision making (see chart for details).       Chest x-ray shows lobar pneumonia.  Mild AKI.  Work-up consistent with severe sepsis with lactate of 2.8.  No new hypoxia though he has mildly increased work of breathing.  No need for BiPAP or intubation at this time.  Testing for the novel coronavirus is negative.  Admit to the hospitalist service. Given fluids, antibiotics.   JAMARKUS HYMER was evaluated in Emergency Department on 09/10/2018 for the symptoms described in the history of present illness. He was evaluated in the context of the global COVID-19 pandemic, which necessitated consideration that the patient might be at risk for infection with the SARS-CoV-2 virus that causes COVID-19. Institutional protocols and algorithms that pertain to the evaluation of patients at risk for COVID-19 are in a state of rapid change based on information released by regulatory bodies including the CDC and federal and state organizations. These policies and algorithms were followed during the patient's care in the ED.    Final Clinical Impressions(s) / ED Diagnoses   Final diagnoses:  Severe sepsis ([place])  Community acquired pneumonia of right lower lobe of lung [facility])    ED Discharge Orders    None       Sherwood Gambler, MD 09/10/18 1127    Sherwood Gambler, MD 09/24/18 [phone]

## 2018-09-10 NOTE — Consult Note (Signed)
NAME:  Adrian Andrews, MRN:  [id number], DOB:  Dec 26, 1950, LOS: 0 ADMISSION DATE:  09/10/2018, CONSULTATION DATE:  09/10/2018 REFERRING MD:  [facility], CHIEF COMPLAINT:  SOB   Brief History   68 yo M admitted for PNA. Borderline low BP, interval increase in Lactic acid. Admitted to hospitalist service. PCCM to consult   History of present illness   68 yo M PMH prior CVA, carotid stenosis s/p bilaterals stents, COPD on home 2-3LNC (followed by Dr. Gala Murdoch at [facility]), DM2, CKD III, HLD, chronic pain who presented to ED 8/27 due to fever and SOB. Patient reports that SOB began about 3 days ago, and patient has had associated yellow/green productive cough. Patient endorses associated Tmax 101F at home. Patient also endorses concomitant diarrhea x 3 days which has improved some with immodium, denies presence of blood in stool, and endorses poor PO intake x 1 week.   In ED, patient COVID-19 test results negative. Lactic acid increased to 4.0 from 2.8. Na 130, Glu 276, BUN 36 Cr 1.83.  The patient's CXR reveals lobar opacities suggestive of PNA. The patient was started on abx and fluids.   Past Medical History  COPD Carotid stenosis CVA HTN HLD DM2 Basal cell carcinoma CKD III Actinic keratosis Tobacco use disorder PVD Significant Hospital Events   8/27 admitted to hospitalist service. Started on ceftriaxone and azithromycin, IVF started   Consults:  PCCM  Procedures:    Significant Diagnostic Tests:  CXR 8/27> RLL opacity  Micro Data:  8/27 SARS Cov2> negative  8/27 BCx >>>   Antimicrobials:  Azithromycin 8/27>> Ceftriaxone 8/27>>   Interim history/subjective:  Admitted to progressive. BP borderline low however patient mentation remains appropriate.  Objective   Blood pressure 93/70, pulse (!) 106, temperature 98.7 F (37.1 C), temperature source Oral, resp. rate 20, height 5\' 9"  (1.753 m), weight 81.1 kg, SpO2 100 %.        Intake/Output Summary (Last 24 hours) at  09/10/2018 1300 Last data filed at 09/10/2018 1053 Gross per 24 hour  Intake 1350 ml  Output -  Net 1350 ml   Filed Weights   09/10/18 0858 09/10/18 1251  Weight: 79.4 kg 81.1 kg    Examination: General: Chronically ill older adult M reclined in bed NAD HENT: NCAT, patent nares, 3LNC in place, pink tacky mm, trachea midline, anicteric sclera Lungs: RML RLL rhonchi. L side cta. Symmetrical chest expansion, no accessory muscle use Cardiovascular: RRR s1s2. Cap refill < 3 sec. 2+ radial pulse Abdomen: Protuberant, soft, nontender. + bowel sounds x4 Extremities: Symmetrical bulk and tone, no cyanosis or clubbing Neuro: AAOx4. Following commands, PERRL GU: defer Skin: clean, dry, warm, without rash   Resolved Hospital Problem list     Assessment & Plan:   Sepsis due to suspected PNA -RLL opacity on CXR -started on azith/cefepime in ED -Covid-19 negative -BP borderline low, likely component of hypovolemia due to poor PO intake + diarrhea in addition to SIRS response. I am very assured by the patient's mental status  P Admitted to progressive, hospitalist service Follow up RVP, sputum cx, bcx Agree with abx per primary Agree with continuing volume resuscitation. Will order additional IVF bolus  Agree with mucinex  CPT  HOB > 30 degrees  PCT CXR PRN   COPD -on home [facility]  -does not have clinical feature of COPD exacerbation at this time P Continue Nelson for goal 88-92% Albuterol PRN Duoneb q6hr IS, flutter  IV steroids ordered by primary. If  improves, likely ok to de-escalate.   AKI on CKD III P Volume resuscitation as above Trend UOP Consider pharmacy to dose abx as needed  Trend UOP via strict I/O   DM II P SSI   Hx HTN P Continue tele Would recommend holding antihypertensives in setting of borderline-low BP   Hx CVA -patient states "sometimes it is still hard for me to find the right words just so you know... but that's just how it can be!" P Supportive  care Continue lipitor, ASA   Chronic pain P Gabapentin, ultram  Recent Hx diarrhea P Consider PRN imodium   Depression P Zoloft  Goals of Care DNR/DNI    Rest per primary   Thank you for consulting critical care. At this time, the patient is hemodynamically appropriate for progressive care and is not showing s/sx respiratory distress. A degree of the patient's We will sign off at this time. Please re-engage PCCM if needed.     Best practice:  Diet: carb control  Pain/Anxiety/Delirium protocol (if indicated): gaba/ultram VAP protocol (if indicated): na DVT prophylaxis: lovenox  GI prophylaxis: na  Glucose control: SSI  Mobility: PT/OT  Code Status: DNR  Family Communication: patient updated Disposition: Progressive   Labs   CBC: Recent Labs  Lab 09/10/18 0912  WBC 9.3  NEUTROABS 8.4*  HGB 11.2*  HCT 33.3*  MCV 97.7  PLT 111*    Basic Metabolic Panel: Recent Labs  Lab 09/10/18 0912  NA 130*  K 4.7  CL 94*  CO2 20*  GLUCOSE 276*  BUN 36*  CREATININE 1.83*  CALCIUM 8.9   GFR: Estimated Creatinine Clearance: 38.1 mL/min (A) (by C-G formula based on SCr of 1.83 mg/dL (H)). Recent Labs  Lab 09/10/18 0912 09/10/18 1103  WBC 9.3  --   LATICACIDVEN 2.8* 4.0*    Liver Function Tests: Recent Labs  Lab 09/10/18 0912  AST 22  ALT 19  ALKPHOS 50  BILITOT 2.5*  PROT 6.3*  ALBUMIN 3.0*   No results for input(s): LIPASE, AMYLASE in the last 168 hours. No results for input(s): AMMONIA in the last 168 hours.  ABG No results found for: PHART, PCO2ART, PO2ART, HCO3, TCO2, ACIDBASEDEF, O2SAT   Coagulation Profile: Recent Labs  Lab 09/10/18 0912  INR 1.3*    Cardiac Enzymes: No results for input(s): CKTOTAL, CKMB, CKMBINDEX, TROPONINI in the last 168 hours.  HbA1C: Hgb A1c MFr Bld  Date/Time Value Ref Range Status  02/15/2018 03:19 AM 7.4 (H) 4.8 - 5.6 % Final    Comment:    (NOTE) Pre diabetes:          5.7%-6.4% Diabetes:               >6.4% Glycemic control for   <7.0% adults with diabetes   02/19/2017 11:40 AM 7.4 (H) 4.8 - 5.6 % Final    Comment:    (NOTE) Pre diabetes:          5.7%-6.4% Diabetes:              >6.4% Glycemic control for   <7.0% adults with diabetes     CBG: No results for input(s): GLUCAP in the last 168 hours.  Review of Systems:   As noted in HPI   Past Medical History  He,  has a past medical history of Anxiety, Arthritis, Chronic kidney disease, COPD (chronic obstructive pulmonary disease) ([place]), Diabetes mellitus without complication ([place]), Dyspnea, Heart murmur, Hyperlipidemia, Hypertension, Neuropathy, Peripheral vascular disease ([place]), Skin cancer,  and Stroke [facility]).   Surgical History    Past Surgical History:  Procedure Laterality Date  . ENDARTERECTOMY Left 02/21/2017   Procedure: LEFT CAROTID ENDARTERECTOMY;  Surgeon: Rosetta Posner, MD;  Location: [facility] OR;  Service: Vascular;  Laterality: Left;  . lower extremity stenting    . PATCH ANGIOPLASTY Left 02/21/2017   Procedure: PATCH ANGIOPLASTY OF LEFT CAROTID ARTERY USING HEMASHIELD PLATINUM FINESSE PATCH;  Surgeon: Rosetta Posner, MD;  Location: [facility] OR;  Service: Vascular;  Laterality: Left;     Social History   reports that he quit smoking about 4 months ago. His smoking use included cigarettes. He has a 120.00 pack-year smoking history. He has never used smokeless tobacco. He reports that he does not drink alcohol or use drugs.   Family History   His family history is not on file.   Allergies Allergies  Allergen Reactions  . Clindamycin/Lincomycin     Throat tightens up  . Contrast Media [Iodinated Diagnostic Agents] Shortness Of Breath    11/20/2004 reaction with CTA CAP  . Metformin And Related Other (See Comments)    Effected kidney function     Home Medications  Prior to Admission medications   Medication Sig Start Date End Date Taking? Authorizing Provider  albuterol (VENTOLIN HFA) 108 (90 Base) MCG/ACT inhaler  Inhale 2 puffs into the lungs every 6 (six) hours as needed for wheezing or shortness of breath. 08/10/18  Yes Olalere, Adewale A, MD  aspirin EC 81 MG tablet Take 81 mg by mouth daily.   Yes [provider]  aspirin-acetaminophen-caffeine (EXCEDRIN MIGRAINE) [phone] MG tablet Take by mouth every 6 (six) hours as needed for headache.   Yes [provider]  atenolol (TENORMIN) 50 MG tablet Take 50 mg by mouth 2 (two) times daily.   Yes [provider]  atorvastatin (LIPITOR) 40 MG tablet Take 20 mg by mouth at bedtime.    Yes [provider]  gabapentin (NEURONTIN) 300 MG capsule Take 300 mg by mouth at bedtime.    Yes [provider]  glipiZIDE (GLUCOTROL) 10 MG tablet Take 1 tablet (10 mg total) by mouth 2 (two) times daily before a meal. 02/16/18  Yes Rai, Ripudeep K, MD  Glycopyrrolate-Formoterol (BEVESPI AEROSPHERE) 9-4.8 MCG/ACT AERO Inhale 2 puffs into the lungs 2 (two) times daily. 08/10/18  Yes Olalere, Adewale A, MD  guaiFENesin (MUCINEX) 600 MG 12 hr tablet Take 1 tablet (600 mg total) by mouth 2 (two) times daily. 02/16/18  Yes Rai, Ripudeep K, MD  pioglitazone (ACTOS) 15 MG tablet Take 1 tablet (15 mg total) by mouth daily. 02/16/18  Yes Rai, Ripudeep K, MD  sertraline (ZOLOFT) 50 MG tablet Take 50 mg by mouth daily.   Yes [provider]  traMADol (ULTRAM) 50 MG tablet Take 1 tablet (50 mg total) by mouth 3 (three) times daily as needed for moderate pain (arthritis). 02/21/17  Yes Rhyne, Hulen Shouts, PA-C  traZODone (DESYREL) 50 MG tablet Take 50 mg by mouth at bedtime. 01/29/18  Yes [provider]    Face-to-face time: 25 minutes   Eliseo Gum MSN, AGACNP-BC [place] [phone] If no answer, [phone] 09/10/2018, 1:00 PM

## 2018-09-10 NOTE — ED Notes (Signed)
Report given to [facility] on 2W

## 2018-09-10 NOTE — H&P (Signed)
History and Physical    Adrian Andrews [id number] DOB: December 03, 1950 DOA: 09/10/2018  PCP: [name], MD Consultants:  Ander Slade - pulmonology Patient coming from:   Home - lives alone; NOK:  Monna Fam [place], [place]  Chief Complaint: Fever  HPI: Adrian Andrews is a 68 y.o. male with medical history significant of CVA; carotid stenosis s/p B stents; HTN; HLD; DM; COPD on 2L home O2; and stage 3 CKD presenting with fever. He reports that his back is hurting, has chronic pain; he has not had his home meds yet.  He developed a fever a few days ago.  +cough - productive of a small amount of light colored mucus.  +SOB for several days.  He wears 3L home O2 and did not increase that.  No wheezing.  He has also had diarrhea for a couple of days; he has had this in the past with PNA.  He took some Imodium AD and hasn't had diarrhea since, but was having 3-4 stools per day prior.  He was last admitted in 01/2018 for acute COPD exacerbation.  ED Course:  Baseline COPD, presenting with RLLL CAP.  COVID negative.  Fever, cough, diarrhea x 2 days, lactate 2.8, 1 BP 90/78 and all otherwise ok.  Given Rocephin/Azithromycin.  Review of Systems: As per HPI; otherwise review of systems reviewed and negative.   Ambulatory Status:  Ambulates without assistance  Past Medical History:  Diagnosis Date  . Anxiety   . Arthritis    back, right hip  . Chronic kidney disease    stage 3  . COPD (chronic obstructive pulmonary disease) ([place])    on 3L home O2  . Diabetes mellitus without complication ([place])   . Dyspnea   . Heart murmur   . Hyperlipidemia   . Hypertension   . Neuropathy   . Peripheral vascular disease ([place])    Carotid artery stenosis, s/p bilat stents  . Skin cancer    basal cell right cheek, left ear  . Stroke [facility])    noted on MRI    Past Surgical History:  Procedure Laterality Date  . ENDARTERECTOMY Left 02/21/2017   Procedure: LEFT CAROTID ENDARTERECTOMY;  Surgeon: Rosetta Posner, MD;  Location: [facility] OR;  Service: Vascular;  Laterality: Left;  . lower extremity stenting    . PATCH ANGIOPLASTY Left 02/21/2017   Procedure: PATCH ANGIOPLASTY OF LEFT CAROTID ARTERY USING HEMASHIELD PLATINUM FINESSE PATCH;  Surgeon: Rosetta Posner, MD;  Location: [facility] OR;  Service: Vascular;  Laterality: Left;    Social History   Socioeconomic History  . Marital status: Single    Spouse name: Not on file  . Number of children: 2  . Years of education: Not on file  . Highest education level: Not on file  Occupational History    Comment: retired  Scientific laboratory technician  . Financial resource strain: Patient refused  . Food insecurity    Worry: Patient refused    Inability: Patient refused  . Transportation needs    Medical: No    Non-medical: No  Tobacco Use  . Smoking status: Former Smoker    Packs/day: 2.00    Years: 60.00    Pack years: 120.00    Types: Cigarettes    Quit date: 04/2018    Years since quitting: 0.4  . Smokeless tobacco: Never Used  Substance and Sexual Activity  . Alcohol use: No    Comment: heavy drinker in past, none for 20 years  . Drug  use: No  . Sexual activity: Not Currently  Lifestyle  . Physical activity    Days per week: Patient refused    Minutes per session: Patient refused  . Stress: Only a little  Relationships  . Social Herbalist on phone: Patient refused    Gets together: Patient refused    Attends religious service: Patient refused    Active member of club or organization: Patient refused    Attends meetings of clubs or organizations: Patient refused    Relationship status: Patient refused  . Intimate partner violence    Fear of current or ex partner: No    Emotionally abused: No    Physically abused: No    Forced sexual activity: No  Other Topics Concern  . Not on file  Social History Narrative  . Not on file    Allergies  Allergen Reactions  . Clindamycin/Lincomycin     Throat tightens up  . Contrast Media  [Iodinated Diagnostic Agents] Shortness Of Breath    11/20/2004 reaction with CTA CAP  . Metformin And Related Other (See Comments)    Effected kidney function    History reviewed. No pertinent family history.  Prior to Admission medications   Medication Sig Start Date End Date Taking? Authorizing Provider  albuterol (VENTOLIN HFA) 108 (90 Base) MCG/ACT inhaler Inhale 2 puffs into the lungs every 6 (six) hours as needed for wheezing or shortness of breath. 08/10/18   Olalere, Adewale A, MD  aspirin EC 81 MG tablet Take 81 mg by mouth daily.    [provider]  aspirin-acetaminophen-caffeine (EXCEDRIN MIGRAINE) [phone] MG tablet Take by mouth every 6 (six) hours as needed for headache.    [provider]  atenolol (TENORMIN) 50 MG tablet Take 50 mg by mouth 2 (two) times daily.    [provider]  atorvastatin (LIPITOR) 40 MG tablet Take 20 mg by mouth at bedtime.     [provider]  gabapentin (NEURONTIN) 300 MG capsule Take 300 mg by mouth at bedtime.     [provider]  glipiZIDE (GLUCOTROL) 10 MG tablet Take 1 tablet (10 mg total) by mouth 2 (two) times daily before a meal. 02/16/18   Rai, Ripudeep K, MD  Glycopyrrolate-Formoterol (BEVESPI AEROSPHERE) 9-4.8 MCG/ACT AERO Inhale 2 puffs into the lungs 2 (two) times daily. Patient not taking: Reported on 08/10/2018 03/13/18   Laurin Coder, MD  Glycopyrrolate-Formoterol (BEVESPI AEROSPHERE) 9-4.8 MCG/ACT AERO Inhale 2 puffs into the lungs 2 (two) times daily. 08/10/18   Olalere, Cicero Duck A, MD  guaiFENesin (MUCINEX) 600 MG 12 hr tablet Take 1 tablet (600 mg total) by mouth 2 (two) times daily. 02/16/18   Rai, Ripudeep K, MD  pioglitazone (ACTOS) 15 MG tablet Take 1 tablet (15 mg total) by mouth daily. 02/16/18   Rai, Ripudeep K, MD  sertraline (ZOLOFT) 50 MG tablet Take 50 mg by mouth daily.    [provider]  traMADol (ULTRAM) 50 MG tablet Take 1 tablet (50 mg total) by mouth 3 (three)  times daily as needed for moderate pain (arthritis). 02/21/17   Rhyne, Hulen Shouts, PA-C  traZODone (DESYREL) 50 MG tablet Take 50 mg by mouth at bedtime. 01/29/18   [provider]    Physical Exam: Vitals:   09/10/18 1100 09/10/18 1130 09/10/18 1200 09/10/18 1251  BP: (!) 94/55 (!) 98/51 (!) 100/57 93/70  Pulse:  (!) 108 (!) 107 (!) 106  Resp: 15 18 (!) 23 20  Temp:      TempSrc:      SpO2:  98% 99% 100%  Weight:    81.1 kg  Height:         . General:  Appears ill, with increased WOB . Eyes:  PERRL, EOMI, normal lids, iris . ENT:  grossly normal hearing, lips & tongue, mmm; artificial dentition . Neck:  no LAD, masses or thyromegaly . Cardiovascular:  RRR, no m/r/g. No LE edema.  Marland Kitchen Respiratory:  RLL rhonchi.  Mildly to moderately increased respiratory effort. . Abdomen:  soft, NT, ND, NABS; significant but stable ventral hernia . Back:   normal alignment, no CVAT . Skin:  no rash or induration seen on limited exam . Musculoskeletal:  grossly normal tone BUE/BLE, good ROM, no bony abnormality . Psychiatric:  blunted mood and affect, speech fluent and appropriate, AOx3 . Neurologic:  CN 2-12 grossly intact, moves all extremities in coordinated fashion, sensation intact    Radiological Exams on Admission: Dg Chest Port 1 View  Result Date: 09/10/2018 CLINICAL DATA:  68 year old male with cough and fever EXAM: PORTABLE CHEST 1 VIEW COMPARISON:  February 13, 2018 FINDINGS: Cardiomediastinal silhouette unchanged in size and contour. Interval development of airspace opacity at the right lung base, partially obscuring the right hemidiaphragm and the right heart border. No pneumothorax. No large pleural effusion. IMPRESSION: Right lobar pneumonia Electronically Signed   By: Corrie Mckusick D.O.   On: 09/10/2018 09:32    EKG: Independently reviewed.  NSR with rate 92; nonspecific ST changes with no evidence of acute ischemia   Labs on Admission: I have personally reviewed the  available labs and imaging studies at the time of the admission.  Pertinent labs:   Na++ 130 CO2 20 Glucose 276 Anion gap 16 Albumin 3.0 BUN 36/Creatinine 1.83/GFR 37; 31/1.32/55 in 2/20 Lactate 2.8 -> 4.0 WBC 9.3 Hgb 11.2 Platelets 111 INR 1.3 COVID negative today and also 7/31, 7/30   Assessment/Plan Principal Problem:   Sepsis due to pneumonia [facility]) Active Problems:   AKI (acute kidney injury) ([place])   COPD (chronic obstructive pulmonary disease) (HCC)   Type 2 diabetes mellitus with other specified complication (HCC)   CKD (chronic kidney disease) stage 3, GFR 30-59 ml/min (HCC)   RLL pneumonia (HCC)   Essential hypertension   Dyslipidemia   Sepsis due to PNA -SIRS criteria in this patient includes: fever, tachycardia, tachynpea  -Patient has evidence of acute organ failure with elevated lactate which is progressing upwards and is currently 4.0 as well as borderline hypotension -While awaiting blood cultures, this appears to be a preseptic condition. -Sepsis protocol initiated -Patient had lactate 4 and so has received the 30 cc/kg IVF bolus. -Suspected source is PNA -Given productive cough, fever to 102, mildly decreased oxygen saturation, and infiltrate in right lower lobe on chest x-ray, most likely community-acquired pneumonia.  -COVID negative. -CURB-65 score is 2 - will admit the patient to Progressive Care. -Pneumonia Severity Index (PSI) is Class 4, 9% mortality. -Corticosteroids have been to shown to low overall mortality rate; risk of ARDS; and need for mechanical ventilation.  This is particularly true in severe PNA (class 3+ PSI).  Will add BID Solumdedrol for now. -Will start Azithromycin 500 mg daily AND Rocephin due to no risk factors for MDR cause. -NS @ 75cc/hr -Fever control -Repeat CBC in am -Sputum cultures -Strep pneumo testing -Will order lower respiratory tract procalcitonin level.  Antibiotics would not be indicated for PCT <0.1 and probably  should  not be used for < 0.25.  >0.5 indicates infection and >>0.5 indicates more serious disease.  As the procalcitonin level normalizes, it will be reasonable to consider de-escalation of antibiotic coverage. -albuterol PRN -Standing Duonebs -Blood and urine cultures pending -Will admit due to: hemodynamic instability;  hypoxemia -Will trend lactate to ensure improvement -PCCM has been requested to see patient due to hemodynamic instability and the risk for clinical decompensation, although there does not appear to be need for intervention at this time -Mucinex as needed for cough  AKI on stage 3 CKD -Likely volume deficiency in the setting of infection as well as diarrhea -Repleted with sepsis bolus and will continue with IVF for now -Recheck BMP in AM  COPD -Patient with baseline COPD, on home O2 - so chronic respiratory failure -Start Duonebs, prn albuterol -Hold Bevespi, as this is not available on formulary  HTN -Hold Atenolol for now based on borderline hypotension  HLD -Continue Lipitor  DM -Will check A1c -hold Glucotrol, Actos -Cover with moderate-scale SSI  Chronic pain -Takes Ultram chronically -I have reviewed this patient in the [place] Controlled Substances Reporting System.  He is receiving medications from only one provider and appears to be taking them as prescribed. -He is not at particularly high risk of opioid misuse, diversion, or overdose. -He was given morphine in the ER just prior to transfer upstairs and his BP decreased, so he was given an additional bolus of 500 cc IVF.     Note: This patient has been tested and is negative for the novel coronavirus COVID-19.    DVT prophylaxis:  Lovenox Code Status:  DNR - confirmed with patient Family Communication: None present Disposition Plan:  Home once clinically improved Consults called: PCCM; CM, PT, OT, nutrition Admission status: Admit - It is my clinical opinion that admission to INPATIENT is  reasonable and necessary because of the expectation that this patient will require hospital care that crosses at least 2 midnights to treat this condition based on the medical complexity of the problems presented.  Given the aforementioned information, the predictability of an adverse outcome is felt to be significant.     Karmen Bongo MD [facility]   How to contact the [facility] Attending or Consulting provider [place] or covering provider during after hours [place], for this patient?  1. Check the care team in [facility] and look for a) attending/consulting TRH provider listed and b) the [facility] team listed 2. Log into [email or web address] and use [place]'s universal password to access. If you do not have the password, please contact the hospital operator. 3. Locate the [facility] provider you are looking for under [facility] and page to a number that you can be directly reached. 4. If you still have difficulty reaching the provider, please page the [facility] (Director on Call) for the Hospitalists listed on amion for assistance.   09/10/2018, 1:09 PM

## 2018-09-11 DIAGNOSIS — I10 Essential (primary) hypertension: Secondary | ICD-10-CM

## 2018-09-11 DIAGNOSIS — J441 Chronic obstructive pulmonary disease with (acute) exacerbation: Secondary | ICD-10-CM

## 2018-09-11 DIAGNOSIS — N179 Acute kidney failure, unspecified: Secondary | ICD-10-CM

## 2018-09-11 DIAGNOSIS — E1169 Type 2 diabetes mellitus with other specified complication: Secondary | ICD-10-CM

## 2018-09-11 LAB — LACTIC ACID, PLASMA: Lactic Acid, Venous: 1 mmol/L (ref 0.5–1.9)

## 2018-09-11 LAB — CBC WITH DIFFERENTIAL/PLATELET
Abs Immature Granulocytes: 0.01 K/uL (ref 0.00–0.07)
Basophils Absolute: 0 K/uL (ref 0.0–0.1)
Basophils Relative: 0 %
Eosinophils Absolute: 0 K/uL (ref 0.0–0.5)
Eosinophils Relative: 0 %
HCT: 28.3 % — ABNORMAL LOW (ref 39.0–52.0)
Hemoglobin: 9.2 g/dL — ABNORMAL LOW (ref 13.0–17.0)
Immature Granulocytes: 0 %
Lymphocytes Relative: 6 %
Lymphs Abs: 0.3 K/uL — ABNORMAL LOW (ref 0.7–4.0)
MCH: 32.2 pg (ref 26.0–34.0)
MCHC: 32.5 g/dL (ref 30.0–36.0)
MCV: 99 fL (ref 80.0–100.0)
Monocytes Absolute: 0.2 K/uL (ref 0.1–1.0)
Monocytes Relative: 3 %
Neutro Abs: 4.5 K/uL (ref 1.7–7.7)
Neutrophils Relative %: 91 %
Platelets: 96 K/uL — ABNORMAL LOW (ref 150–400)
RBC: 2.86 MIL/uL — ABNORMAL LOW (ref 4.22–5.81)
RDW: 14.5 % (ref 11.5–15.5)
WBC: 4.9 K/uL (ref 4.0–10.5)
nRBC: 0 % (ref 0.0–0.2)

## 2018-09-11 LAB — BASIC METABOLIC PANEL
Anion gap: 11 (ref 5–15)
BUN: 34 mg/dL — ABNORMAL HIGH (ref 8–23)
CO2: 18 mmol/L — ABNORMAL LOW (ref 22–32)
Calcium: 8.3 mg/dL — ABNORMAL LOW (ref 8.9–10.3)
Chloride: 104 mmol/L (ref 98–111)
Creatinine, Ser: 1.28 mg/dL — ABNORMAL HIGH (ref 0.61–1.24)
GFR calc Af Amer: >60 mL/min (ref >60)
GFR calc non Af Amer: 57 mL/min — ABNORMAL LOW (ref >60)
Glucose, Bld: 221 mg/dL — ABNORMAL HIGH (ref 70–99)
Potassium: 3.8 mmol/L (ref 3.5–5.1)
Sodium: 133 mmol/L — ABNORMAL LOW (ref 135–145)

## 2018-09-11 LAB — GLUCOSE, CAPILLARY
Glucose-Capillary: 216 mg/dL — ABNORMAL HIGH (ref 70–99)
Glucose-Capillary: 275 mg/dL — ABNORMAL HIGH (ref 70–99)
Glucose-Capillary: 246 mg/dL — ABNORMAL HIGH (ref 70–99)
Glucose-Capillary: 305 mg/dL — ABNORMAL HIGH (ref 70–99)

## 2018-09-11 MED ORDER — GABAPENTIN 300 MG PO CAPS
600.0000 mg | ORAL_CAPSULE | Freq: Every day | ORAL | Status: DC
  Administered 2018-09-11 – 2018-09-27 (×17): 600 mg via ORAL
  Filled 2018-09-11 (×19): qty 2

## 2018-09-11 MED ORDER — GABAPENTIN 300 MG PO CAPS
300.0000 mg | ORAL_CAPSULE | Freq: Every morning | ORAL | Status: DC
  Administered 2018-09-11 – 2018-09-28 (×18): 300 mg via ORAL
  Filled 2018-09-11 (×18): qty 1

## 2018-09-11 MED ORDER — ATENOLOL 25 MG PO TABS: 25.0000 mg | ORAL_TABLET | Freq: Every day | ORAL | Status: DC

## 2018-09-11 MED ORDER — INSULIN DETEMIR 100 UNIT/ML ~~LOC~~ SOLN
8.0000 [IU] | Freq: Two times a day (BID) | SUBCUTANEOUS | Status: DC
  Administered 2018-09-11 – 2018-09-18 (×15): 8 [IU] via SUBCUTANEOUS
  Filled 2018-09-11 (×17): qty 0.08

## 2018-09-11 MED ORDER — ATENOLOL 25 MG PO TABS
12.5000 mg | ORAL_TABLET | Freq: Every day | ORAL | Status: DC
  Administered 2018-09-12 – 2018-09-13 (×2): 12.5 mg via ORAL
  Filled 2018-09-11 (×3): qty 1

## 2018-09-11 NOTE — Evaluation (Signed)
Occupational Therapy Evaluation Patient Details Name: Adrian Andrews MRN: [id number] DOB: 15-Dec-1950 Today's Date: 09/11/2018    History of Present Illness Adrian Andrews is a 68 y.o. male with medical history significant of CVA; carotid stenosis s/p B stents; HTN; HLD; DM; COPD on 2L home O2; and stage 3 CKD presenting with fever, cough, and diarrhea. Found to have Sepsis due to pneumonia   Clinical Impression   This 68 yo male admitted with above presents to acute OT with decreased activity tolerance with increased work of breathing thus affecting his safety and independence with basic ADLs and pt lives alone. He will benefit from acute OT with follow up at OT at [facility], unless progresses quickly then [facility] may be appropriate.    Follow Up Recommendations  SNF;Supervision/Assistance - 24 hour;Other (comment)([facility] progresses [facility].)    Equipment Recommendations  None recommended by OT       Precautions / Restrictions Precautions Precautions: Fall Precaution Comments: monitor O2 (today his sats where in the 90's on 3 liters with increased work of breathing--several times O2 was not reading) Restrictions Weight Bearing Restrictions: No      Mobility Bed Mobility Overal bed mobility: Needs Assistance Bed Mobility: Supine to Sit     Supine to sit: Min guard     General bed mobility comments: increased time due to increased work of breathing with any mobility  Transfers Overall transfer level: Needs assistance Equipment used: 2 person hand held assist Transfers: Sit to/from [facility] Sit to Stand: Min assist;+2 physical assistance Stand pivot transfers: Min assist;+2 physical assistance            Balance Overall balance assessment: Needs assistance Sitting-balance support: No upper extremity supported;Feet supported Sitting balance-Leahy Scale: Fair     Standing balance support: Bilateral upper extremity  supported Standing balance-Leahy Scale: Poor                             ADL either performed or assessed with clinical judgement   ADL Overall ADL's : Needs assistance/impaired Eating/Feeding: Independent;Sitting   Grooming: Set up;Supervision/safety;Sitting Grooming Details (indicate cue type and reason): increased time due to increased WOB with any activity Upper Body Bathing: Set up;Supervision/ safety Upper Body Bathing Details (indicate cue type and reason): increased time due to increased WOB with any activity Lower Body Bathing: Moderate assistance Lower Body Bathing Details (indicate cue type and reason): +2 min A sit<>stand Upper Body Dressing : Set up;Supervision/safety;Sitting Upper Body Dressing Details (indicate cue type and reason): increased time due to increased WOB with any activity Lower Body Dressing: Maximal assistance Lower Body Dressing Details (indicate cue type and reason): +2 min A sit<>stand Toilet Transfer: Minimal assistance;+2 for physical assistance;Stand-pivot Toilet Transfer Details (indicate cue type and reason): Bil HHA Toileting- Clothing Manipulation and Hygiene: Maximal assistance Toileting - Clothing Manipulation Details (indicate cue type and reason): +2 min A sit<>stand             Vision Patient Visual Report: No change from baseline              Pertinent Vitals/Pain Pain Assessment: 0-10 Pain Score: 8  Pain Location: back and left leg (neuropathy) Pain Descriptors / Indicators: Aching;Sore Pain Intervention(s): Limited activity within patient's tolerance;Monitored during session;Patient requesting pain meds-RN notified     Hand Dominance  right   Extremity/Trunk Assessment Upper Extremity Assessment Upper Extremity Assessment: Overall WFL for tasks assessed  Communication Communication Communication: No difficulties   Cognition Arousal/Alertness: Awake/alert Behavior During Therapy: WFL for tasks  assessed/performed Overall Cognitive Status: Within Functional Limits for tasks assessed                                 General Comments: He was really fixated on getting his gabapentin. We told him we would check with RN and in the meantime of finding his RN (less than 1 minute) he also called her to ask same thing.              Home Living Family/patient expects to be discharged to:: Private residence Living Arrangements: Alone Available Help at Discharge: Other (Comment)(lives across the street from ex-wife, but she just had two back surgeries) Type of Home: House Home Access: Ramped entrance     Home Layout: One level     Bathroom Shower/Tub: Door;Walk-in shower         Home Equipment: Shower seat;Hand held shower head;Bedside commode          Prior Functioning/Environment Level of Independence: Independent                 OT Problem List: Decreased strength;Decreased activity tolerance;Impaired balance (sitting and/or standing);Cardiopulmonary status limiting activity      OT Treatment/Interventions: Self-care/ADL training;DME and/or AE instruction;Patient/family education;Balance training;Therapeutic activities;Energy conservation    OT Goals(Current goals can be found in the care plan section) Acute Rehab OT Goals Patient Stated Goal: not sure about rehab OT Goal Formulation: With patient Time For Goal Achievement: 09/25/18 Potential to Achieve Goals: Good  OT Frequency: Min 2X/week   Barriers to D/C: Decreased caregiver support          Co-evaluation PT/OT/SLP Co-Evaluation/Treatment: Yes Reason for Co-Treatment: For patient/therapist safety PT goals addressed during session: Mobility/safety with mobility;Balance;Strengthening/ROM OT goals addressed during session: Strengthening/ROM      AM-PAC OT "6 Clicks" Daily Activity     Outcome Measure Help from another person eating meals?: None Help from another person taking care of  personal grooming?: A Little Help from another person toileting, which includes using toliet, bedpan, or urinal?: A Lot Help from another person bathing (including washing, rinsing, drying)?: A Lot Help from another person to put on and taking off regular upper body clothing?: A Little Help from another person to put on and taking off regular lower body clothing?: A Lot 6 Click Score: 16   End of Session Nurse Communication: (pt requesting gabapentin)  Activity Tolerance: (pt limited by increased work of breathing with all activity) Patient left: in chair;with call bell/phone within reach;with chair alarm set  OT Visit Diagnosis: Unsteadiness on feet (R26.81);Other abnormalities of gait and mobility (R26.89);Muscle weakness (generalized) (M62.81);Pain Pain - Right/Left: Left Pain - part of body: Leg(and back)                Time: [id number] OT Time Calculation (min): 28 min Charges:  OT General Charges $OT Visit: 1 Visit OT Evaluation $OT Eval Moderate Complexity: 1 Mod  [address], OTR/L Acute [employer] Pager [phone] Office [phone]     Almon Register 09/11/2018, 12:21 PM

## 2018-09-11 NOTE — TOC Initial Note (Addendum)
Transition of Care [facility]) - Initial/Assessment Note    Patient Details  Name: Adrian Andrews MRN: [id number] Date of Birth: September 16, 1950  Transition of Care [facility]) CM/SW Contact:    Maryclare Labrador, RN Phone Number: 09/11/2018, 10:48 AM  Clinical Narrative:     PTA indpendent from home .  Pt is on 2 liters home oxygen supplied by [employer].  Pt is interested in [facility] for disease management - CM provided choice per medicare.gov HH list - pt chose [facility] -  tentative referral given to agency awaiting orders             Update:  Pt now recommended for SNF dependent upon progression.   CM contacted by therapy - pt may need SNF if he doesn't progress with mobility and respiratory tolerance once ready for discharge.  CM spoke with pt again regarding discharge disposition.  Pt acknowledges the recommendation for SNF however informed CM that he would like to wait and reassess the possibility of needing to go to SNF at later time. Pt is currently on 2 IV antibiotics 3 liters [place] and IV steroids, extremely SOB during conversations.  Pt told CM  " I may not need it if I get better".  CM explained the SNF process and at this time pt is declining for CM to begin process.   CM will request the weekend [facility] Liaison to follow up with pt dependent upon progression via TOC handoff.     Expected Discharge Plan: [place] Barriers to Discharge: Continued Medical Work up   Patient Goals and CMS Choice Patient states their goals for this hospitalization and ongoing recovery are:: Pt would like to be able perform normal activites without getting SOB CMS Medicare.gov Compare Post Acute Care list provided to:: Patient Choice offered to / list presented to : Patient  Expected Discharge Plan and Services Expected Discharge Plan: [place]       Living arrangements for the past 2 months: Single Family Home                           HH Arranged: RN   Date [place]:  09/11/18 Time HH Agency Contacted: 50 Representative spoke with at [place]: Tommi Rumps  Prior Living Arrangements/Services Living arrangements for the past 2 months: [place] with:: Self Patient language and need for interpreter reviewed:: Yes Do you feel safe going back to the place where you live?: Yes      Need for Family Participation in Patient Care: Yes (Comment) Care giver support system in place?: Yes (comment) Current home services: DME(home oxygen) Criminal Activity/Legal Involvement Pertinent to Current Situation/Hospitalization: No - Comment as needed  Activities of Daily Living Home Assistive Devices/Equipment: None ADL Screening (condition at time of admission) Patient's cognitive ability adequate to safely complete daily activities?: Yes Is the patient deaf or have difficulty hearing?: No Does the patient have difficulty seeing, even when wearing glasses/contacts?: No Does the patient have difficulty concentrating, remembering, or making decisions?: No Patient able to express need for assistance with ADLs?: Yes Does the patient have difficulty dressing or bathing?: No Independently performs ADLs?: Yes (appropriate for developmental age) Does the patient have difficulty walking or climbing stairs?: No Weakness of Legs: Both Weakness of Arms/Hands: Both  Permission Sought/Granted   Permission granted to share information with : Yes, Verbal Permission Granted     Permission granted to share info  w AGENCYAlvis Lemmings        Emotional Assessment     Affect (typically observed): Accepting, Adaptable Orientation: : Oriented to Self, Oriented to Place, Oriented to  Time, Oriented to Situation   Psych Involvement: No (comment)  Admission diagnosis:  Severe sepsis ([place]) [A41.9, R65.20] Community acquired pneumonia of right lower lobe of lung ([place]) [J18.1] RLL pneumonia ([place]) [J18.1] Patient Active Problem List   Diagnosis Date Noted  . RLL pneumonia  ([place]) 09/10/2018  . Sepsis due to pneumonia ([place]) 09/10/2018  . Essential hypertension 09/10/2018  . Dyslipidemia 09/10/2018  . COPD with acute exacerbation ([place]) 02/14/2018  . Nicotine use disorder 02/14/2018  . CKD (chronic kidney disease) stage 3, GFR 30-59 ml/min (HCC) 02/14/2018  . COPD (chronic obstructive pulmonary disease) ([place]) 02/13/2018  . Lactic acidosis 02/13/2018  . Type 2 diabetes mellitus with other specified complication ([place]) [id number]  . Acute on chronic respiratory failure with hypoxia ([place]) 12/21/2017  . CAP (community acquired pneumonia) 12/21/2017  . AKI (acute kidney injury) ([place]) 12/21/2017  . COPD exacerbation ([place]) 12/20/2017  . Heart failure ([place]) 12/20/2017  . Carotid artery stenosis, symptomatic, left 02/21/2017   PCP:  [name], MD Pharmacy:   [facility] [address] [place]), [place] - [id number] [place] [address] Phone: [phone] Fax: [phone]     Social Determinants of Health (SDOH) Interventions    Readmission Risk Interventions No flowsheet data found.

## 2018-09-11 NOTE — Progress Notes (Signed)
   Vital Signs MEWS/VS Documentation      09/11/2018 0822 09/11/2018 0831 09/11/2018 1055 09/11/2018 1209   MEWS Score:  1  -  -  2   MEWS Score Color:  Green  -  -  Yellow   BP:  -  -  -  95/75   Temp:  98 F (36.7 C)  -  -  -   O2 Device:  -  Nasal Cannula  Nasal Cannula  -   O2 Flow Rate (L/min):  -  3 L/min  3 L/min  -   Level of Consciousness:  -  -  -  Alert     No acute change, pt resting comfortably, easy to arouse and in no distress. Will continue to monitor pt.      Gamble Enderle 09/11/2018,3:04 PM

## 2018-09-11 NOTE — Progress Notes (Signed)
PROGRESS NOTE  Adrian Andrews [id number] DOB: May 03, 1950   PCP: [name], MD  Patient is from: Home  DOA: 09/10/2018 LOS: 1  Brief Narrative / Interim history: 68 year old male with history of CVA, carotid artery stenosis/stent, DM-2, CKD 3, HTN, HLD, COPD-3L and former smoker presenting with fever, productive cough, shortness of breath and diarrhea and admitted for sepsis due to RLL pneumonia, and AKI.  In ED, soft blood pressures.  Lactic acidosis.  CXR revealed RLL pneumonia.  COVID-19 negative. Started on ceftriaxone, azithromycin and a steroid.  PCCM consulted given soft blood pressure and uptrending lactic acidosis and recommended hydration.  Subjective: No major events overnight of this morning.  Asking to have his gabapentin this morning.  Continues to endorse some shortness of breath and productive cough with whitish phlegm..  Denies chest pain or hemoptysis..  Denies nausea, vomiting or abdominal pain.  Objective: Vitals:   09/11/18 0822 09/11/18 0831 09/11/18 1055 09/11/18 1209  BP:    95/75  Pulse:      Resp:      Temp: 98 F (36.7 C)     TempSrc: Oral     SpO2:  98% 99%   Weight:      Height:        Intake/Output Summary (Last 24 hours) at 09/11/2018 1327 Last data filed at 09/11/2018 0500 Gross per 24 hour  Intake 2317.5 ml  Output 1370 ml  Net 947.5 ml   Filed Weights   09/10/18 0858 09/10/18 1251  Weight: 79.4 kg 81.1 kg    Examination:  GENERAL: Mild increased work of breathing.  Looks anxious. HEENT: MMM.  Vision and hearing grossly intact.  NECK: Supple.  No apparent JVD.  RESP: Mild increased work of breathing.  Crackles over RLL. CVS:  RRR. Heart sounds normal.  ABD/GI/GU: Bowel sounds present. Soft. Non tender.  MSK/EXT:  Moves extremities. No apparent deformity or edema.  SKIN: no apparent skin lesion or wound NEURO: Awake, alert and oriented appropriately.  No gross deficit.  PSYCH: Looks anxious.  Assessment & Plan: Sepsis due  to RLL pneumonia/CAP: Improved but still with significant work of breathing. -CXR with RLL pneumonia.  COVID-19 negative. -Procalcitonin elevated-continue trending. -Lactic acidosis resolved. -Continue current regimen with ceftriaxone, azithromycin and steroid. -Continue COPD medications, incentive spirometry and flutter valve. -RVP negative.  Follow cultures, strep pneumo and Legionella tests. -Appreciate PCCM.   Lactic acidosis: Likely due to the above.  Resolved.  AKI on CKD 3: Likely prerenal in the setting of diarrhea and sepsis.  AKI appears to be resolved. -Avoid nephrotoxic meds -Continue monitoring.  COPD exacerbation: On 3 L at home.  Patient has cardinal symptoms.  Likely due to pneumonia. -Continue steroid and breathing treatments.  Hypertension: Still with soft blood pressures but improved. -Continue IV fluids -Resume home atenolol at low-dose.  Uncontrolled NIDDM-2 with hyperglycemia renal complication: [id number].  CBG elevated likely due to steroid. -Add Levemir 8 units twice daily -SSI-moderate -Continue home Lipitor and gabapentin  History of CVA: Stable. -Continue home statin  Mood disorder: Looks anxious on exam.  Denies drinking alcohol. -Continue home Zoloft, trazodone and gabapentin.  DVT prophylaxis: Subcu Lovenox Code Status: DNR/DNI Family Communication: Patient and/or RN. Available if any question. Disposition Plan: Remains inpatient.  Still with increased work of breathing and soft blood pressures. Consultants: PCCM  Procedures:  None  Microbiology summarized: [id number] negative. RVP negative. Blood and urine cultures pending.  Antimicrobials: Anti-infectives (From admission, onward)   Start  Dose/Rate Route Frequency Ordered Stop   09/10/18 0915  cefTRIAXone (ROCEPHIN) 2 g in sodium chloride 0.9 % 100 mL IVPB     2 g 200 mL/hr over 30 Minutes Intravenous Every 24 hours 09/10/18 0901     09/10/18 0915  azithromycin (ZITHROMAX) 500 mg in  sodium chloride 0.9 % 250 mL IVPB     500 mg 250 mL/hr over 60 Minutes Intravenous Every 24 hours 09/10/18 0901        Sch Meds:  Scheduled Meds:  aspirin EC  81 mg Oral Daily   atenolol  12.5 mg Oral Daily   atorvastatin  20 mg Oral QHS   enoxaparin (LOVENOX) injection  40 mg Subcutaneous Q24H   gabapentin  300 mg Oral q morning - 10a   gabapentin  600 mg Oral QHS   guaiFENesin  600 mg Oral BID   insulin aspart  0-15 Units Subcutaneous TID WC   insulin aspart  0-5 Units Subcutaneous QHS   insulin detemir  8 Units Subcutaneous BID   ipratropium-albuterol  3 mL Nebulization Q6H   methylPREDNISolone (SOLU-MEDROL) injection  60 mg Intravenous Q12H   sertraline  50 mg Oral Daily   traZODone  50 mg Oral QHS   Continuous Infusions:  sodium chloride 75 mL/hr at 09/10/18 1458   azithromycin 500 mg (09/11/18 0934)   cefTRIAXone (ROCEPHIN)  IV 2 g (09/11/18 0910)   PRN Meds:.albuterol, alum & mag hydroxide-simeth, loperamide, traMADol   I have personally reviewed the following labs and images: CBC: Recent Labs  Lab 09/10/18 0912 09/11/18 0708  WBC 9.3 4.9  NEUTROABS 8.4* 4.5  HGB 11.2* 9.2*  HCT 33.3* 28.3*  MCV 97.7 99.0  PLT 111* 96*   BMP &GFR Recent Labs  Lab 09/10/18 0912 09/11/18 0708  NA 130* 133*  K 4.7 3.8  CL 94* 104  CO2 20* 18*  GLUCOSE 276* 221*  BUN 36* 34*  CREATININE 1.83* 1.28*  CALCIUM 8.9 8.3*   Estimated Creatinine Clearance: 54.5 mL/min (A) (by C-G formula based on SCr of 1.28 mg/dL (H)). Liver & Pancreas: Recent Labs  Lab 09/10/18 0912  AST 22  ALT 19  ALKPHOS 50  BILITOT 2.5*  PROT 6.3*  ALBUMIN 3.0*   No results for input(s): LIPASE, AMYLASE in the last 168 hours. No results for input(s): AMMONIA in the last 168 hours. Diabetic: Recent Labs    09/10/18 1528  HGBA1C 7.6*   Recent Labs  Lab 09/10/18 1720 09/10/18 2050 09/11/18 0824 09/11/18 1208  GLUCAP 375* 281* 216* 275*   Cardiac Enzymes: No results  for input(s): CKTOTAL, CKMB, CKMBINDEX, TROPONINI in the last 168 hours. No results for input(s): PROBNP in the last 8760 hours. Coagulation Profile: Recent Labs  Lab 09/10/18 0912  INR 1.3*   Thyroid Function Tests: No results for input(s): TSH, T4TOTAL, FREET4, T3FREE, THYROIDAB in the last 72 hours. Lipid Profile: No results for input(s): CHOL, HDL, LDLCALC, TRIG, CHOLHDL, LDLDIRECT in the last 72 hours. Anemia Panel: No results for input(s): VITAMINB12, FOLATE, FERRITIN, TIBC, IRON, RETICCTPCT in the last 72 hours. Urine analysis:    Component Value Date/Time   COLORURINE YELLOW 02/13/2018 1735   APPEARANCEUR CLEAR 02/13/2018 1735   LABSPEC 1.020 02/13/2018 1735   PHURINE 6.0 02/13/2018 1735   GLUCOSEU >=500 (A) 02/13/2018 1735   HGBUR MODERATE (A) 02/13/2018 1735   BILIRUBINUR NEGATIVE 02/13/2018 1735   BILIRUBINUR neg 09/10/2013 1308   KETONESUR 5 (A) 02/13/2018 1735   PROTEINUR 100 (A) 02/13/2018  1735   UROBILINOGEN 0.2 09/10/2013 1308   NITRITE NEGATIVE 02/13/2018 1735   LEUKOCYTESUR NEGATIVE 02/13/2018 1735   Sepsis Labs: Invalid input(s): PROCALCITONIN, [place]  Microbiology: Recent Results (from the past 240 hour(s))  SARS Coronavirus 2 [facility] order, Performed in [facility] hospital lab) Nasopharyngeal Nasopharyngeal Swab     Status: None   Collection Time: 09/10/18  9:12 AM   Specimen: Nasopharyngeal Swab  Result Value Ref Range Status   SARS Coronavirus 2 NEGATIVE NEGATIVE Final    Comment: (NOTE) If result is NEGATIVE SARS-CoV-2 target nucleic acids are NOT DETECTED. The SARS-CoV-2 RNA is generally detectable in upper and lower  respiratory specimens during the acute phase of infection. The lowest  concentration of SARS-CoV-2 viral copies this assay can detect is 250  copies / mL. A negative result does not preclude SARS-CoV-2 infection  and should not be used as the sole basis for treatment or other  patient management decisions.  A negative  result may occur with  improper specimen collection / handling, submission of specimen other  than nasopharyngeal swab, presence of viral mutation(s) within the  areas targeted by this assay, and inadequate number of viral copies  (<250 copies / mL). A negative result must be combined with clinical  observations, patient history, and epidemiological information. If result is POSITIVE SARS-CoV-2 target nucleic acids are DETECTED. The SARS-CoV-2 RNA is generally detectable in upper and lower  respiratory specimens dur ing the acute phase of infection.  Positive  results are indicative of active infection with SARS-CoV-2.  Clinical  correlation with patient history and other diagnostic information is  necessary to determine patient infection status.  Positive results do  not rule out bacterial infection or co-infection with other viruses. If result is PRESUMPTIVE POSTIVE SARS-CoV-2 nucleic acids MAY BE PRESENT.   A presumptive positive result was obtained on the submitted specimen  and confirmed on repeat testing.  While 2019 novel coronavirus  (SARS-CoV-2) nucleic acids may be present in the submitted sample  additional confirmatory testing may be necessary for epidemiological  and / or clinical management purposes  to differentiate between  SARS-CoV-2 and other Sarbecovirus currently known to infect humans.  If clinically indicated additional testing with an alternate test  methodology [phone]) is advised. The SARS-CoV-2 RNA is generally  detectable in upper and lower respiratory sp ecimens during the acute  phase of infection. The expected result is Negative. Fact Sheet for Patients:  StrictlyIdeas.no Fact Sheet for Healthcare Providers: BankingDealers.co.za This test is not yet approved or cleared by the [place] FDA and has been authorized for detection and/or diagnosis of SARS-CoV-2 by FDA under an Emergency Use Authorization  (EUA).  This EUA will remain in effect (meaning this test can be used) for the duration of the COVID-19 declaration under Section 564(b)(1) of the Act, 21 U.S.C. section 360bbb-3(b)(1), unless the authorization is terminated or revoked sooner. Performed at [facility], [place] [address] [place], [place] [address]   Respiratory Panel by PCR     Status: None   Collection Time: 09/10/18  1:20 PM   Specimen: Nasopharyngeal Swab; Respiratory  Result Value Ref Range Status   Adenovirus NOT DETECTED NOT DETECTED Final   Coronavirus 229E NOT DETECTED NOT DETECTED Final    Comment: (NOTE) The Coronavirus on the Respiratory Panel, DOES NOT test for the novel  Coronavirus (2019 nCoV)    Coronavirus HKU1 NOT DETECTED NOT DETECTED Final   Coronavirus NL63 NOT DETECTED NOT DETECTED Final  Coronavirus OC43 NOT DETECTED NOT DETECTED Final   Metapneumovirus NOT DETECTED NOT DETECTED Final   Rhinovirus / Enterovirus NOT DETECTED NOT DETECTED Final   Influenza A NOT DETECTED NOT DETECTED Final   Influenza B NOT DETECTED NOT DETECTED Final   Parainfluenza Virus 1 NOT DETECTED NOT DETECTED Final   Parainfluenza Virus 2 NOT DETECTED NOT DETECTED Final   Parainfluenza Virus 3 NOT DETECTED NOT DETECTED Final   Parainfluenza Virus 4 NOT DETECTED NOT DETECTED Final   Respiratory Syncytial Virus NOT DETECTED NOT DETECTED Final   Bordetella pertussis NOT DETECTED NOT DETECTED Final   Chlamydophila pneumoniae NOT DETECTED NOT DETECTED Final   Mycoplasma pneumoniae NOT DETECTED NOT DETECTED Final    Comment: Performed at [facility], [place] [address] [place], [place] [address]    Radiology Studies: No results found.  35 minutes with more than 50% spent in reviewing records, counseling patient and coordinating care.  Jewelz Kobus T. [place]  If 7PM-7AM, please contact night-coverage [email or web address] Password [email or web address] 09/11/2018, 1:27 PM

## 2018-09-11 NOTE — Progress Notes (Signed)
Inpatient Diabetes Program Recommendations  AACE/ADA: New Consensus Statement on Inpatient Glycemic Control (2015)  Target Ranges:  Prepandial:   less than 140 mg/dL      Peak postprandial:   less than 180 mg/dL (1-2 hours)      Critically ill patients:  140 - 180 mg/dL   Lab Results  Component Value Date   GLUCAP 275 (H) 09/11/2018   HGBA1C 7.6 (H) 09/10/2018    Review of Glycemic Control Results for Adrian Andrews, Adrian Andrews (MRN [id number]) as of 09/11/2018 12:39  Ref. Range 09/10/2018 17:20 09/10/2018 20:50 09/11/2018 08:24 09/11/2018 12:08  Glucose-Capillary Latest Ref Range: 70 - 99 mg/dL 375 (H) 281 (H) 216 (H) 275 (H)   Diabetes history: DM 2 Outpatient Diabetes medications: Glucotrol 10 mg bid, Actos 15 mg daily Current orders for Inpatient glycemic control:  Novolog moderate tid with meals and HS Solumedrol 60 mg IV q 12 hours  Inpatient Diabetes Program Recommendations:    Please consider adding Levemir 8 units bid while patient is in the hospital and on steroids.   Thanks,  Adah Perl, RN, BC-ADM Inpatient Diabetes Coordinator Pager [phone] (8a-5p)

## 2018-09-11 NOTE — Progress Notes (Addendum)
   Vital Signs MEWS/VS Documentation      09/11/2018 1500 09/11/2018 1543 09/11/2018 1634 09/11/2018 1649   MEWS Score:  2  -  2  (Pended)   2   MEWS Score Color:  Yellow  -  Yellow  (Pended)   Yellow   Resp:  14  -  -  -   Pulse:  (!) 111  -  -  -   BP:  (!) 91/59  -  -  91/71   Temp:  -  -  -  98.2 F (36.8 C)   O2 Device:  -  Nasal Cannula  -  Nasal Cannula   O2 Flow Rate (L/min):  -  3 L/min  -  3 L/min   Level of Consciousness:  -  -  Alert  (Pended)   -           Sakari Alkhatib 09/11/2018,5:50 PM   Fluids increased from 1ml to 110ml of normal saline. Physician notified. Atenelol held. Will continue to monitor.

## 2018-09-11 NOTE — Evaluation (Signed)
Physical Therapy Evaluation Patient Details Name: Adrian Andrews MRN: [id number] DOB: 06/08/1950 Today's Date: 09/11/2018   History of Present Illness  Adrian Andrews is a 68 y.o. male with medical history significant of CVA; carotid stenosis s/p B stents; HTN; HLD; DM; COPD on 2L home O2; and stage 3 CKD presenting with fever, cough, and diarrhea. Found to have Sepsis due to pneumonia  Clinical Impression  Patient presents with decreased independence and safety with mobility due to decreased activity tolerance, decreased balance, decreased strength and will benefit from skilled PT in the acute setting.  Currently could not go home to care for himself.  If improved clinically may tolerate more activity.  Feel currently appropriate for SNF level rehab.     Follow Up Recommendations SNF    Equipment Recommendations  Other (comment)(tba)    Recommendations for Other Services       Precautions / Restrictions Precautions Precautions: Fall Precaution Comments: monitor O2 (today his sats where in the 90's on 3 liters with increased work of breathing--several times O2 was not reading) Restrictions Weight Bearing Restrictions: No      Mobility  Bed Mobility Overal bed mobility: Needs Assistance Bed Mobility: Supine to Sit     Supine to sit: Min assist     General bed mobility comments: assisted to lift trunk due to pain in back  Transfers Overall transfer level: Needs assistance Equipment used: 2 person hand held assist Transfers: Sit to/from [employer] Transfers Sit to Stand: Min assist;+2 physical assistance Stand pivot transfers: Min assist;+2 physical assistance       General transfer comment: limited bed to chair only due to significant anxiety and increased WOB  Ambulation/Gait                Stairs            Wheelchair Mobility    Modified Rankin (Stroke Patients Only)       Balance Overall balance assessment: Needs  assistance Sitting-balance support: No upper extremity supported;Feet supported Sitting balance-Leahy Scale: Fair     Standing balance support: Bilateral upper extremity supported Standing balance-Leahy Scale: Poor                               Pertinent Vitals/Pain Pain Assessment: 0-10 Pain Score: 8  Pain Location: back and left leg (neuropathy) Pain Descriptors / Indicators: Aching;Sore Pain Intervention(s): Monitored during session;Repositioned;Patient requesting pain meds-RN notified    Home Living Family/patient expects to be discharged to:: Private residence Living Arrangements: Alone Available Help at Discharge: Other (Comment)(lives across the street from ex-wife, but she just had two back surgeries) Type of Home: House Home Access: Ramped entrance     Home Layout: One level Home Equipment: [place] held shower head;Bedside commode      Prior Function Level of Independence: Independent               Hand Dominance        Extremity/Trunk Assessment   Upper Extremity Assessment Upper Extremity Assessment: Overall WFL for tasks assessed    Lower Extremity Assessment Lower Extremity Assessment: Overall WFL for tasks assessed       Communication   Communication: No difficulties  Cognition Arousal/Alertness: Awake/alert Behavior During Therapy: Anxious Overall Cognitive Status: Within Functional Limits for tasks assessed  General Comments: He was really fixated on getting his gabapentin. We told him we would check with RN and in the meantime of finding his RN (less than 1 minute) he also called her to ask same thing.      General Comments      Exercises     Assessment/Plan    PT Assessment Patient needs continued PT services  PT Problem List Decreased strength;Decreased activity tolerance;Decreased mobility;Cardiopulmonary status limiting activity;Decreased balance       PT  Treatment Interventions DME instruction;Therapeutic activities;Gait training;Functional mobility training;Therapeutic exercise;Balance training;Patient/family education    PT Goals (Current goals can be found in the Care Plan section)  Acute Rehab PT Goals Patient Stated Goal: not sure about rehab PT Goal Formulation: With patient Time For Goal Achievement: 09/25/18 Potential to Achieve Goals: Good    Frequency Min 3X/week   Barriers to discharge Decreased caregiver support      Co-evaluation PT/OT/SLP Co-Evaluation/Treatment: Yes Reason for Co-Treatment: For patient/therapist safety PT goals addressed during session: Mobility/safety with mobility;Balance;Strengthening/ROM OT goals addressed during session: Strengthening/ROM       AM-PAC PT "6 Clicks" Mobility  Outcome Measure Help needed turning from your back to your side while in a flat bed without using bedrails?: A Little Help needed moving from lying on your back to sitting on the side of a flat bed without using bedrails?: A Little Help needed moving to and from a bed to a chair (including a wheelchair)?: A Lot Help needed standing up from a chair using your arms (e.g., wheelchair or bedside chair)?: A Lot Help needed to walk in hospital room?: Total Help needed climbing 3-5 steps with a railing? : Total 6 Click Score: 12    End of Session Equipment Utilized During Treatment: Oxygen Activity Tolerance: Patient limited by fatigue Patient left: in chair;with call bell/phone within reach;with chair alarm set Nurse Communication: Patient requests pain meds PT Visit Diagnosis: Other abnormalities of gait and mobility (R26.89);Muscle weakness (generalized) (M62.81)    Time: [id number] PT Time Calculation (min) (ACUTE ONLY): 28 min   Charges:   PT Evaluation $PT Eval Moderate Complexity: [place], [place] Acute Rehabilitation Services [phone] 09/11/2018   Reginia Naas 09/11/2018, 1:25 PM

## 2018-09-11 NOTE — Progress Notes (Signed)
Nutrition Brief Note  RD consulted for COPD gold protocol.   Patient with no weight loss, weights have been trending upward since 2019.  Wt Readings from Last 15 Encounters:  09/10/18 81.1 kg  08/10/18 80.5 kg  03/13/18 67.1 kg  02/14/18 67.9 kg  12/23/17 67.4 kg  04/01/17 71.3 kg  03/18/17 71.7 kg  02/21/17 68.5 kg  02/19/17 68.9 kg  02/14/17 68.5 kg  01/14/15 73.9 kg  09/10/13 73.5 kg  07/28/13 73.8 kg  04/05/13 78.3 kg  02/12/13 78 kg    Body mass index is 26.4 kg/m. Patient meets criteria for overweight based on current BMI.   Current diet order is CHO modified. Labs and medications reviewed.   No nutrition interventions warranted at this time. If nutrition issues arise, please consult RD.   Clayton Bibles, MS, RD, LDN  Inpatient Clinical Dietitian Pager: [phone] After Hours Pager: [phone]

## 2018-09-12 DIAGNOSIS — K5900 Constipation, unspecified: Secondary | ICD-10-CM

## 2018-09-12 LAB — BASIC METABOLIC PANEL
Anion gap: 10 (ref 5–15)
BUN: 41 mg/dL — ABNORMAL HIGH (ref 8–23)
CO2: 22 mmol/L (ref 22–32)
Calcium: 8.6 mg/dL — ABNORMAL LOW (ref 8.9–10.3)
Chloride: 102 mmol/L (ref 98–111)
Creatinine, Ser: 1.33 mg/dL — ABNORMAL HIGH (ref 0.61–1.24)
GFR calc Af Amer: >60 mL/min (ref >60)
GFR calc non Af Amer: 54 mL/min — ABNORMAL LOW (ref >60)
Glucose, Bld: 196 mg/dL — ABNORMAL HIGH (ref 70–99)
Potassium: 3.9 mmol/L (ref 3.5–5.1)
Sodium: 134 mmol/L — ABNORMAL LOW (ref 135–145)

## 2018-09-12 LAB — CBC
HCT: 28.9 % — ABNORMAL LOW (ref 39.0–52.0)
Hemoglobin: 9.7 g/dL — ABNORMAL LOW (ref 13.0–17.0)
MCH: 33.7 pg (ref 26.0–34.0)
MCHC: 33.6 g/dL (ref 30.0–36.0)
MCV: 100.3 fL — ABNORMAL HIGH (ref 80.0–100.0)
Platelets: 124 10*3/uL — ABNORMAL LOW (ref 150–400)
RBC: 2.88 MIL/uL — ABNORMAL LOW (ref 4.22–5.81)
RDW: 14.6 % (ref 11.5–15.5)
WBC: 8.1 10*3/uL (ref 4.0–10.5)
nRBC: 0.2 % (ref 0.0–0.2)

## 2018-09-12 LAB — MAGNESIUM: Magnesium: 2 mg/dL (ref 1.7–2.4)

## 2018-09-12 LAB — PROCALCITONIN: Procalcitonin: 5.25 ng/mL

## 2018-09-12 LAB — GLUCOSE, CAPILLARY
Glucose-Capillary: 175 mg/dL — ABNORMAL HIGH (ref 70–99)
Glucose-Capillary: 140 mg/dL — ABNORMAL HIGH (ref 70–99)
Glucose-Capillary: 123 mg/dL — ABNORMAL HIGH (ref 70–99)
Glucose-Capillary: 133 mg/dL — ABNORMAL HIGH (ref 70–99)

## 2018-09-12 MED ORDER — PANTOPRAZOLE SODIUM 40 MG PO TBEC
40.0000 mg | DELAYED_RELEASE_TABLET | Freq: Two times a day (BID) | ORAL | Status: DC
  Administered 2018-09-12 – 2018-09-28 (×32): 40 mg via ORAL
  Filled 2018-09-12 (×33): qty 1

## 2018-09-12 MED ORDER — SODIUM CHLORIDE 0.9 % IV SOLN
2.0000 g | Freq: Three times a day (TID) | INTRAVENOUS | Status: DC
  Administered 2018-09-12 – 2018-09-18 (×18): 2 g via INTRAVENOUS
  Filled 2018-09-12 (×21): qty 2

## 2018-09-12 MED ORDER — POLYETHYLENE GLYCOL 3350 17 G PO PACK
17.0000 g | PACK | Freq: Every day | ORAL | Status: DC | PRN
  Administered 2018-09-12: 17 g via ORAL
  Filled 2018-09-12 (×3): qty 1

## 2018-09-12 MED ORDER — SENNOSIDES-DOCUSATE SODIUM 8.6-50 MG PO TABS
1.0000 | ORAL_TABLET | Freq: Every day | ORAL | Status: DC
  Administered 2018-09-13 – 2018-09-22 (×9): 1 via ORAL
  Filled 2018-09-12 (×12): qty 1

## 2018-09-12 MED ORDER — IPRATROPIUM-ALBUTEROL 0.5-2.5 (3) MG/3ML IN SOLN
3.0000 mL | Freq: Three times a day (TID) | RESPIRATORY_TRACT | Status: DC
  Administered 2018-09-13: 3 mL via RESPIRATORY_TRACT
  Filled 2018-09-12: qty 3

## 2018-09-12 MED ORDER — ONDANSETRON HCL 4 MG/2ML IJ SOLN
4.0000 mg | Freq: Four times a day (QID) | INTRAMUSCULAR | Status: DC | PRN
  Administered 2018-09-12 – 2018-09-18 (×4): 4 mg via INTRAVENOUS
  Filled 2018-09-12 (×4): qty 2

## 2018-09-12 NOTE — Progress Notes (Signed)
PROGRESS NOTE  Adrian Andrews [id number] DOB: 04-15-1950   PCP: [name], MD  Patient is from: Home  DOA: 09/10/2018 LOS: 2  Brief Narrative / Interim history: 68 year old male with history of CVA, carotid artery stenosis/stent, DM-2, CKD 3, HTN, HLD, COPD-3L and former smoker presenting with fever, productive cough, shortness of breath and diarrhea and admitted for sepsis due to RLL pneumonia, and AKI.  In ED, soft blood pressures.  Lactic acidosis.  CXR revealed RLL pneumonia.  COVID-19 negative. Started on ceftriaxone, azithromycin and a steroid.  PCCM consulted given soft blood pressure and uptrending lactic acidosis and recommended hydration.  Subjective: Oxygen increased to 5 L by [place] overnight.  Saturation in low 90s.  Reports some improvement in his breathing.  Has productive cough with dark phlegm.  He denies bright red blood.  Also reports some abdominal distention.  He says he has not a bowel movement in the last 2 days.  He is afraid of taking bowel regimen as he cannot get out of bed.  He denies chest pain.  Objective: Vitals:   09/12/18 0758 09/12/18 0807 09/12/18 1000 09/12/18 1100  BP: 125/82 (!) 145/56  115/83  Pulse:  75  (!) 108  Resp:    20  Temp: 98.4 F (36.9 C) 98.7 F (37.1 C) 98.6 F (37 C) 98.6 F (37 C)  TempSrc: Oral Oral Oral Oral  SpO2:  93%  (!) 84%  Weight:      Height:        Intake/Output Summary (Last 24 hours) at 09/12/2018 1215 Last data filed at 09/12/2018 1100 Gross per 24 hour  Intake -  Output 2550 ml  Net -2550 ml   Filed Weights   09/10/18 0858 09/10/18 1251  Weight: 79.4 kg 81.1 kg    Examination:  GENERAL: No acute distress. HEENT: MMM.  Vision and hearing grossly intact.  NECK: Supple.  No apparent JVD.  RESP: On 5 L by [place].  No IWOB.  Crackles over RLL. CVS:  RRR . Heart sounds normal.  ABD/GI/GU: Bowel sounds present. Soft. Non tender.  MSK/EXT:  Moves extremities. No apparent deformity or edema.  SKIN: no  apparent skin lesion or wound NEURO: Awake, alert and oriented appropriately.  No gross deficit.  PSYCH: Calm. Normal affect.   Assessment & Plan: Sepsis due to RLL pneumonia/CAP: requiring 5 L to maintain appropriate saturation.  Productive cough. -CXR with RLL pneumonia.  COVID-19 negative. -Procalcitonin elevated-continue trending. -Lactic acidosis resolved. -Change ceftriaxone to cefepime.  Doubt need for GNR.  He denies emesis. -Continue azithromycin and steroid 8/27>> -Continue COPD medications, incentive spirometry and flutter valve. -RVP and blood cultures negative. -Follow respiratory cultures. -Appreciate PCCM.   Lactic acidosis: Likely due to the above.  Resolved.  AKI on CKD 3: Likely prerenal in the setting of diarrhea and sepsis.  AKI appears to have resolved. -Avoid nephrotoxic meds -Continue monitoring.  COPD exacerbation: On 3 L at home.  Patient has cardinal symptoms.  Likely due to pneumonia. -Continue steroid and breathing treatments. -Wean oxygen as able.  Hypertension: Was hypotensive on presentation.  No normotensive. -Discontinue IV fluid -Resume home atenolol at low-dose.  Uncontrolled NIDDM-2 with hyperglycemia renal complication: [id number].  CBG improved after adjusting insulin. -Continue Levemir 8 units twice daily -SSI-moderate -Continue home Lipitor and gabapentin  History of CVA: Stable. -Continue home statin  Mood disorder: Looks anxious on exam.  Denies drinking alcohol. -Continue home Zoloft, trazodone and gabapentin.  Constipation -Bowel regimen (Senokot and  MiraLAX)  DVT prophylaxis: Subcu Lovenox Code Status: DNR/DNI Family Communication: Patient and/or RN. Available if any question. Disposition Plan: Remains inpatient on IV antibiotics.  Continues to require higher oxygen than baseline.  Final disposition SNF when he is medically ready. Consultants: PCCM  Procedures:  None  Microbiology summarized: [id number] negative. RVP  negative. Blood cultures negative so far Respiratory and urine culture pending.  Antimicrobials: Anti-infectives (From admission, onward)   Start     Dose/Rate Route Frequency Ordered Stop   09/12/18 1400  ceFEPIme (MAXIPIME) 2 g in sodium chloride 0.9 % 100 mL IVPB     2 g 200 mL/hr over 30 Minutes Intravenous Every 8 hours 09/12/18 1036     09/10/18 0915  cefTRIAXone (ROCEPHIN) 2 g in sodium chloride 0.9 % 100 mL IVPB  Status:  Discontinued     2 g 200 mL/hr over 30 Minutes Intravenous Every 24 hours 09/10/18 0901 09/12/18 1036   09/10/18 0915  azithromycin (ZITHROMAX) 500 mg in sodium chloride 0.9 % 250 mL IVPB     500 mg 250 mL/hr over 60 Minutes Intravenous Every 24 hours 09/10/18 0901        Sch Meds:  Scheduled Meds: . aspirin EC  81 mg Oral Daily  . atenolol  12.5 mg Oral Daily  . atorvastatin  20 mg Oral QHS  . gabapentin  300 mg Oral q morning - 10a  . gabapentin  600 mg Oral QHS  . guaiFENesin  600 mg Oral BID  . insulin aspart  0-15 Units Subcutaneous TID WC  . insulin aspart  0-5 Units Subcutaneous QHS  . insulin detemir  8 Units Subcutaneous BID  . ipratropium-albuterol  3 mL Nebulization Q6H  . methylPREDNISolone (SOLU-MEDROL) injection  60 mg Intravenous Q12H  . pantoprazole  40 mg Oral BID  . senna-docusate  1 tablet Oral QHS  . sertraline  50 mg Oral Daily  . traZODone  50 mg Oral QHS   Continuous Infusions: . sodium chloride 100 mL/hr at 09/11/18 2138  . azithromycin 500 mg (09/12/18 1146)  . ceFEPime (MAXIPIME) IV     PRN Meds:.albuterol, alum & mag hydroxide-simeth, loperamide, ondansetron (ZOFRAN) IV, polyethylene glycol, traMADol   I have personally reviewed the following labs and images: CBC: Recent Labs  Lab 09/10/18 0912 09/11/18 0708 09/12/18 0646  WBC 9.3 4.9 8.1  NEUTROABS 8.4* 4.5  --   HGB 11.2* 9.2* 9.7*  HCT 33.3* 28.3* 28.9*  MCV 97.7 99.0 100.3*  PLT 111* 96* 124*   BMP &GFR Recent Labs  Lab 09/10/18 0912 09/11/18 0708  09/12/18 0646  NA 130* 133* 134*  K 4.7 3.8 3.9  CL 94* 104 102  CO2 20* 18* 22  GLUCOSE 276* 221* 196*  BUN 36* 34* 41*  CREATININE 1.83* 1.28* 1.33*  CALCIUM 8.9 8.3* 8.6*  MG  --   --  2.0   Estimated Creatinine Clearance: 52.4 mL/min (A) (by C-G formula based on SCr of 1.33 mg/dL (H)). Liver & Pancreas: Recent Labs  Lab 09/10/18 0912  AST 22  ALT 19  ALKPHOS 50  BILITOT 2.5*  PROT 6.3*  ALBUMIN 3.0*   No results for input(s): LIPASE, AMYLASE in the last 168 hours. No results for input(s): AMMONIA in the last 168 hours. Diabetic: Recent Labs    09/10/18 1528  HGBA1C 7.6*   Recent Labs  Lab 09/11/18 1208 09/11/18 1652 09/11/18 2042 09/12/18 0757 09/12/18 1135  GLUCAP 275* 246* 305* 175* 140*   Cardiac Enzymes:  No results for input(s): CKTOTAL, CKMB, CKMBINDEX, TROPONINI in the last 168 hours. No results for input(s): PROBNP in the last 8760 hours. Coagulation Profile: Recent Labs  Lab 09/10/18 0912  INR 1.3*   Thyroid Function Tests: No results for input(s): TSH, T4TOTAL, FREET4, T3FREE, THYROIDAB in the last 72 hours. Lipid Profile: No results for input(s): CHOL, HDL, LDLCALC, TRIG, CHOLHDL, LDLDIRECT in the last 72 hours. Anemia Panel: No results for input(s): VITAMINB12, FOLATE, FERRITIN, TIBC, IRON, RETICCTPCT in the last 72 hours. Urine analysis:    Component Value Date/Time   COLORURINE YELLOW 02/13/2018 [place] 02/13/2018 1735   LABSPEC 1.020 02/13/2018 1735   PHURINE 6.0 02/13/2018 1735   GLUCOSEU >=500 (A) 02/13/2018 1735   HGBUR MODERATE (A) 02/13/2018 1735   BILIRUBINUR NEGATIVE 02/13/2018 1735   BILIRUBINUR neg 09/10/2013 1308   KETONESUR 5 (A) 02/13/2018 1735   PROTEINUR 100 (A) 02/13/2018 1735   UROBILINOGEN 0.2 09/10/2013 1308   NITRITE NEGATIVE 02/13/2018 1735   LEUKOCYTESUR NEGATIVE 02/13/2018 1735   Sepsis Labs: Invalid input(s): PROCALCITONIN, [place]  Microbiology: Recent Results (from the past  240 hour(s))  SARS Coronavirus 2 [facility] order, Performed in [facility] hospital lab) Nasopharyngeal Nasopharyngeal Swab     Status: None   Collection Time: 09/10/18  9:12 AM   Specimen: Nasopharyngeal Swab  Result Value Ref Range Status   SARS Coronavirus 2 NEGATIVE NEGATIVE Final    Comment: (NOTE) If result is NEGATIVE SARS-CoV-2 target nucleic acids are NOT DETECTED. The SARS-CoV-2 RNA is generally detectable in upper and lower  respiratory specimens during the acute phase of infection. The lowest  concentration of SARS-CoV-2 viral copies this assay can detect is 250  copies / mL. A negative result does not preclude SARS-CoV-2 infection  and should not be used as the sole basis for treatment or other  patient management decisions.  A negative result may occur with  improper specimen collection / handling, submission of specimen other  than nasopharyngeal swab, presence of viral mutation(s) within the  areas targeted by this assay, and inadequate number of viral copies  (<250 copies / mL). A negative result must be combined with clinical  observations, patient history, and epidemiological information. If result is POSITIVE SARS-CoV-2 target nucleic acids are DETECTED. The SARS-CoV-2 RNA is generally detectable in upper and lower  respiratory specimens dur ing the acute phase of infection.  Positive  results are indicative of active infection with SARS-CoV-2.  Clinical  correlation with patient history and other diagnostic information is  necessary to determine patient infection status.  Positive results do  not rule out bacterial infection or co-infection with other viruses. If result is PRESUMPTIVE POSTIVE SARS-CoV-2 nucleic acids MAY BE PRESENT.   A presumptive positive result was obtained on the submitted specimen  and confirmed on repeat testing.  While 2019 novel coronavirus  (SARS-CoV-2) nucleic acids may be present in the submitted sample  additional confirmatory  testing may be necessary for epidemiological  and / or clinical management purposes  to differentiate between  SARS-CoV-2 and other Sarbecovirus currently known to infect humans.  If clinically indicated additional testing with an alternate test  methodology [phone]) is advised. The SARS-CoV-2 RNA is generally  detectable in upper and lower respiratory sp ecimens during the acute  phase of infection. The expected result is Negative. Fact Sheet for Patients:  StrictlyIdeas.no Fact Sheet for Healthcare Providers: BankingDealers.co.za This test is not yet approved or cleared by the [place] FDA and has  been authorized for detection and/or diagnosis of SARS-CoV-2 by FDA under an Emergency Use Authorization (EUA).  This EUA will remain in effect (meaning this test can be used) for the duration of the COVID-19 declaration under Section 564(b)(1) of the Act, 21 U.S.C. section 360bbb-3(b)(1), unless the authorization is terminated or revoked sooner. Performed at [facility], [place] [address] [place], [place] [address]   Blood Culture (routine x 2)     Status: None (Preliminary result)   Collection Time: 09/10/18 10:04 AM   Specimen: BLOOD  Result Value Ref Range Status   Specimen Description BLOOD SITE NOT SPECIFIED  Final   Special Requests   Final    BOTTLES DRAWN AEROBIC AND ANAEROBIC Blood Culture adequate volume   Culture   Final    NO GROWTH 1 DAY Performed at [facility], [place] [address] [place], [place] [address]    Report Status PENDING  Incomplete  Blood Culture (routine x 2)     Status: None (Preliminary result)   Collection Time: 09/10/18 10:10 AM   Specimen: BLOOD LEFT ARM  Result Value Ref Range Status   Specimen Description BLOOD LEFT ARM  Final   Special Requests   Final    BOTTLES DRAWN AEROBIC AND ANAEROBIC Blood Culture adequate volume   Culture   Final    NO GROWTH 1 DAY Performed at [facility], [place] [address] [place], [place] [address]    Report Status PENDING  Incomplete  Respiratory Panel by PCR     Status: None   Collection Time: 09/10/18  1:20 PM   Specimen: Nasopharyngeal Swab; Respiratory  Result Value Ref Range Status   Adenovirus NOT DETECTED NOT DETECTED Final   Coronavirus 229E NOT DETECTED NOT DETECTED Final    Comment: (NOTE) The Coronavirus on the Respiratory Panel, DOES NOT test for the novel  Coronavirus (2019 nCoV)    Coronavirus HKU1 NOT DETECTED NOT DETECTED Final   Coronavirus NL63 NOT DETECTED NOT DETECTED Final   Coronavirus OC43 NOT DETECTED NOT DETECTED Final   Metapneumovirus NOT DETECTED NOT DETECTED Final   Rhinovirus / Enterovirus NOT DETECTED NOT DETECTED Final   Influenza A NOT DETECTED NOT DETECTED Final   Influenza B NOT DETECTED NOT DETECTED Final   Parainfluenza Virus 1 NOT DETECTED NOT DETECTED Final   Parainfluenza Virus 2 NOT DETECTED NOT DETECTED Final   Parainfluenza Virus 3 NOT DETECTED NOT DETECTED Final   Parainfluenza Virus 4 NOT DETECTED NOT DETECTED Final   Respiratory Syncytial Virus NOT DETECTED NOT DETECTED Final   Bordetella pertussis NOT DETECTED NOT DETECTED Final   Chlamydophila pneumoniae NOT DETECTED NOT DETECTED Final   Mycoplasma pneumoniae NOT DETECTED NOT DETECTED Final    Comment: Performed at [facility], [place]. [address], [place],  [address]    Radiology Studies: No results found.  35 minutes with more than 50% spent in reviewing records, counseling patient and coordinating care.   T. [place]  If 7PM-7AM, please contact night-coverage [email or web address] Password [facility] 09/12/2018, 12:15 PM

## 2018-09-12 NOTE — Progress Notes (Signed)
Pt reporting nausea, spitting up, heartburn, chills. Pt is normotensive taken orally and axillary. Vitals are within normal limits. Administered maalox and provided cool cloth for comfort. Will continue to monitor pt.

## 2018-09-13 ENCOUNTER — Inpatient Hospital Stay (HOSPITAL_COMMUNITY): Payer: Medicare Other

## 2018-09-13 DIAGNOSIS — I82409 Acute embolism and thrombosis of unspecified deep veins of unspecified lower extremity: Secondary | ICD-10-CM

## 2018-09-13 LAB — BASIC METABOLIC PANEL
Anion gap: 11 (ref 5–15)
BUN: 40 mg/dL — ABNORMAL HIGH (ref 8–23)
CO2: 17 mmol/L — ABNORMAL LOW (ref 22–32)
Calcium: 8.1 mg/dL — ABNORMAL LOW (ref 8.9–10.3)
Chloride: 103 mmol/L (ref 98–111)
Creatinine, Ser: 1.33 mg/dL — ABNORMAL HIGH (ref 0.61–1.24)
GFR calc Af Amer: >60 mL/min (ref >60)
GFR calc non Af Amer: 54 mL/min — ABNORMAL LOW (ref >60)
Glucose, Bld: 145 mg/dL — ABNORMAL HIGH (ref 70–99)
Potassium: 4.6 mmol/L (ref 3.5–5.1)
Sodium: 131 mmol/L — ABNORMAL LOW (ref 135–145)

## 2018-09-13 LAB — RESPIRATORY PANEL BY PCR

## 2018-09-13 LAB — BLOOD GAS, ARTERIAL
Acid-base deficit: 1.7 mmol/L (ref 0.0–2.0)
Bicarbonate: 21.7 mmol/L (ref 20.0–28.0)
O2 Content: 8 L/min
O2 Saturation: 95.1 %
Patient temperature: 98.3
pCO2 arterial: 31.3 mmHg — ABNORMAL LOW (ref 32.0–48.0)
pH, Arterial: 7.454 — ABNORMAL HIGH (ref 7.350–7.450)
pO2, Arterial: 72.2 mmHg — ABNORMAL LOW (ref 83.0–108.0)

## 2018-09-13 LAB — LACTATE DEHYDROGENASE: LDH: 288 U/L — ABNORMAL HIGH (ref 98–192)

## 2018-09-13 LAB — CBC
HCT: 27.3 % — ABNORMAL LOW (ref 39.0–52.0)
Hemoglobin: 8.9 g/dL — ABNORMAL LOW (ref 13.0–17.0)
MCH: 32.7 pg (ref 26.0–34.0)
MCHC: 32.6 g/dL (ref 30.0–36.0)
MCV: 100.4 fL — ABNORMAL HIGH (ref 80.0–100.0)
Platelets: 136 10*3/uL — ABNORMAL LOW (ref 150–400)
RBC: 2.72 MIL/uL — ABNORMAL LOW (ref 4.22–5.81)
RDW: 14.6 % (ref 11.5–15.5)
WBC: 9.5 10*3/uL (ref 4.0–10.5)
nRBC: 0.2 % (ref 0.0–0.2)

## 2018-09-13 LAB — D-DIMER, QUANTITATIVE: D-Dimer, Quant: 11.2 ug/mL-FEU — ABNORMAL HIGH (ref 0.00–0.50)

## 2018-09-13 LAB — GLUCOSE, CAPILLARY
Glucose-Capillary: 139 mg/dL — ABNORMAL HIGH (ref 70–99)
Glucose-Capillary: 145 mg/dL — ABNORMAL HIGH (ref 70–99)
Glucose-Capillary: 160 mg/dL — ABNORMAL HIGH (ref 70–99)
Glucose-Capillary: 177 mg/dL — ABNORMAL HIGH (ref 70–99)

## 2018-09-13 LAB — SEDIMENTATION RATE: Sed Rate: 121 mm/hr — ABNORMAL HIGH (ref 0–16)

## 2018-09-13 LAB — MAGNESIUM: Magnesium: 1.9 mg/dL (ref 1.7–2.4)

## 2018-09-13 LAB — FIBRINOGEN: Fibrinogen: >800 mg/dL — ABNORMAL HIGH (ref 210–475)

## 2018-09-13 LAB — MRSA PCR SCREENING: MRSA by PCR: NEGATIVE

## 2018-09-13 LAB — C-REACTIVE PROTEIN: CRP: 29.8 mg/dL — ABNORMAL HIGH (ref <1.0)

## 2018-09-13 LAB — BRAIN NATRIURETIC PEPTIDE: B Natriuretic Peptide: 2329.7 pg/mL — ABNORMAL HIGH (ref 0.0–100.0)

## 2018-09-13 LAB — FERRITIN: Ferritin: 593 ng/mL — ABNORMAL HIGH (ref 24–336)

## 2018-09-13 LAB — SARS CORONAVIRUS 2 BY RT PCR (HOSPITAL ORDER, PERFORMED IN ~~LOC~~ HOSPITAL LAB): SARS Coronavirus 2: NEGATIVE

## 2018-09-13 MED ORDER — ENOXAPARIN SODIUM 40 MG/0.4ML ~~LOC~~ SOLN
40.0000 mg | Freq: Every day | SUBCUTANEOUS | Status: DC
  Administered 2018-09-13 – 2018-09-28 (×16): 40 mg via SUBCUTANEOUS
  Filled 2018-09-13 (×16): qty 0.4

## 2018-09-13 MED ORDER — LORAZEPAM 2 MG/ML IJ SOLN
0.5000 mg | Freq: Four times a day (QID) | INTRAMUSCULAR | Status: DC | PRN
  Administered 2018-09-13 – 2018-09-27 (×20): 0.5 mg via INTRAVENOUS
  Filled 2018-09-13 (×24): qty 1

## 2018-09-13 MED ORDER — LEVALBUTEROL HCL 0.63 MG/3ML IN NEBU
0.6300 mg | INHALATION_SOLUTION | Freq: Three times a day (TID) | RESPIRATORY_TRACT | Status: DC
  Administered 2018-09-13 (×2): 0.63 mg via RESPIRATORY_TRACT
  Filled 2018-09-13 (×2): qty 3

## 2018-09-13 MED ORDER — IPRATROPIUM BROMIDE 0.02 % IN SOLN
0.5000 mg | Freq: Three times a day (TID) | RESPIRATORY_TRACT | Status: DC
  Administered 2018-09-13 (×2): 0.5 mg via RESPIRATORY_TRACT
  Filled 2018-09-13 (×2): qty 2.5

## 2018-09-13 MED ORDER — SALINE SPRAY 0.65 % NA SOLN
1.0000 | NASAL | Status: DC | PRN
  Administered 2018-09-13 – 2018-09-14 (×2): 1 via NASAL
  Filled 2018-09-13: qty 44

## 2018-09-13 MED ORDER — FLUTICASONE PROPIONATE 50 MCG/ACT NA SUSP
2.0000 | Freq: Every day | NASAL | Status: DC
  Administered 2018-09-13 – 2018-09-28 (×13): 2 via NASAL
  Filled 2018-09-13: qty 16

## 2018-09-13 MED ORDER — FUROSEMIDE 10 MG/ML IJ SOLN
40.0000 mg | Freq: Every day | INTRAMUSCULAR | Status: DC
  Administered 2018-09-13 – 2018-09-16 (×4): 40 mg via INTRAVENOUS
  Filled 2018-09-13 (×4): qty 4

## 2018-09-13 NOTE — Plan of Care (Signed)
  Problem: Education: Goal: Knowledge of General Education information will improve Description: Including pain rating scale, medication(s)/side effects and non-pharmacologic comfort measures Outcome: Progressing   Problem: Clinical Measurements: Goal: Ability to maintain clinical measurements within normal limits will improve Outcome: Progressing Goal: Will remain free from infection Outcome: Progressing   

## 2018-09-13 NOTE — Significant Event (Addendum)
Rapid Response Event Note  Overview: Respiratory - Increased WOB and Oxygen Demand  Initial Focused Assessment: Called by primary nurse about the patient having increased work of breathing. Per nurse, patient has baseline anxiety and currently is quite anxious which is exacerbating his respiratory drive. Upon arrival, Adrian Andrews was awake, able to talk in full sentences, speech was slurred (per him, he had some slurred speech since his stroke), lung sounds - rhonchi throughout R > L, oral airway - secretions present, I get the impression that the patient might be swallowing his secretions instead of coughing them up and suctioning them out. 97% on 8L HFNC, RR was in the 22-26, labored but not in distress, mild accessory muscle as times. HR/BP are stable.  Patient was quite anxious, when I asked him there is anything I can do for him, he explained to me that his concerns about insurance payments and car payments. I asked the nursing staff to consult CSW/NCM as well to help Adrian Andrews with his concerns.   Interventions: -- STAT CXR - worsening PNA -- STAT ABG - 7.45/31.3/72.2/21.7/ - O2 Sat 95 -- Xopenox/Atrovent Nebs per RT   Plan of Care: -- Perhaps a small dose of anxiolytic to help with anxiety -- Encourage FV/IS - patient has not been coughing up his secretions but instead swallowing them it seems like  -- OOB to chair as patient tolerates  -- Aspiration Precautions -- HOB > 30, suction at bedside -- Delirium Precautions -- promote sleep at night  -- Dysphagia ? Monitor for signs of dysphagia, given that patient has some slurred speech, perhaps making sure his swallowing function is okay.  -- Monitor VS, wean oxygen as patient tolerates   Event Summary:  Call Time Adrian Andrews  Adrian Andrews R

## 2018-09-13 NOTE — Progress Notes (Signed)
SLP Cancellation Note  Patient Details Name: Adrian Andrews MRN: [id number] DOB: [birth year]   Cancelled treatment:       Reason Eval/Treat Not Completed: Patient at procedure or test/unavailable. BSE attempted, however, pt off unit for testing. Per DO, pt is very SOB today. No prior SLP intervention found, however pt has had a previous CVA. Will continue efforts to evaluate swallow function and safety. Pt currently on regular texture diet (carb mod restrictions) and thin liquids. Nursing informed.  Floy Angert B. Quentin [facility], CCC-SLP Speech Language Pathologist [phone]  Shonna Chock 09/13/2018, 2:55 PM

## 2018-09-13 NOTE — Progress Notes (Signed)
PROGRESS NOTE  Adrian Andrews [id number] DOB: Apr 19, 1950 DOA: 09/10/2018 PCP: [name], MD  HPI/Recap of past 66 hours: 68 year old male with history of CVA, carotid artery stenosis/stent, DM-2, CKD 3, HTN, HLD, COPD-3L and former smoker presenting with fever, productive cough, shortness of breath and diarrhea and admitted for sepsis due to RLL pneumonia, and AKI.  In ED, soft blood pressures.  Lactic acidosis.  CXR revealed RLL pneumonia.  COVID-19 negative. Started on ceftriaxone, azithromycin and a steroid.  PCCM consulted given soft blood pressure and uptrending lactic acidosis and recommended hydration.  09/13/18: Patient seen and examined at bedside this morning.  Worsening hypoxia with increased oxygen requirement.  Now on 8 L high flow nasal cannula.  Will obtain stat ABG and stat chest x-ray.  He denies chest pain.  Mild to moderate increase work of breathing.  Admits to persistent productive cough.   Assessment/Plan: Principal Problem:   Sepsis due to pneumonia [facility]) Active Problems:   AKI (acute kidney injury) ([place])   COPD (chronic obstructive pulmonary disease) (HCC)   Type 2 diabetes mellitus with other specified complication (HCC)   CKD (chronic kidney disease) stage 3, GFR 30-59 ml/min (HCC)   RLL pneumonia (HCC)   Essential hypertension   Dyslipidemia   Sepsis secondary to right lower lobe community-acquired pneumonia Presented with tachypnea and tachycardia with right lower lobe infiltrates on chest x-ray done on 09/10/2018. Procalcitonin improvement but still elevated 5 (09/12/2018) from 7 Continue cefepime and azithromycin Monitor fever curve and WBC T-max 99.8 this morning  Community-acquired pneumonia, POA Management as stated above Mucinex for cough  Worsening acute on chronic hypoxic respiratory failure On 2 L of oxygen as needed at baseline Now requiring 8 L to maintain saturation greater than 92% Obtain stat ABG and repeat chest x-ray  Personally reviewed chest x-ray done on 09/10/2018 which showed right lower lobe infiltrates Maintain O2 saturation greater than 92% Continue bronchodilators Continue Solu-Medrol 60 mg twice daily Continue close monitoring on telemetry  CKD 3 Appears to be at his baseline creatinine 1.3 with GFR 54 Avoid nephrotoxins Monitor urine output  Anemia of chronic disease/macrocytic anemia Hemoglobin dropped this morning from 9.7-8.9 MCV of 100.4 No sign of overt bleeding Obtain iron studies, add on  Mild thrombocytopenia Presented with platelet count of 111 Platelet count is improving up to 136K this morning  Prior polysubstance abuse with benzodiazepine and cocaine Last UDS on 05/04/2016+ for benzodiazepine and cocaine No repeated UDS during this admission  Chronic anxiety/depression Continue Zoloft, trazodone, Neurontin  COPD Continue inhalers  Hypertension Continue to hold off antihypertensive as blood pressure is currently soft Maintain map greater than 65 Continue to closely monitor vital signs  Type 2 diabetes with hyperglycemia Hemoglobin A1c 7.6 on 09/10/2018 Continue insulin sliding scale Continue Levemir Avoid hypoglycemia    Code Status: DNR  Family Communication: None at bedside  Disposition Plan: Undetermined at this time due to worsening hypoxia requiring further evaluation and treatment.   Consultants:  PCCM  Procedures:  None  Antimicrobials:  Cefepime and azithromycin  DVT prophylaxis: Subcu Lovenox daily.   Objective: Vitals:   09/13/18 0300 09/13/18 0709 09/13/18 0800 09/13/18 0925  BP: 105/75  110/71   Pulse:   93   Resp:   16   Temp: 99.8 F (37.7 C)  98.9 F (37.2 C)   TempSrc:   Oral   SpO2:  98% 95% 90%  Weight:      Height:  Intake/Output Summary (Last 24 hours) at 09/13/2018 1001 Last data filed at 09/13/2018 0449 Gross per 24 hour  Intake -  Output 1750 ml  Net -1750 ml   Filed Weights   09/10/18 0858  09/10/18 1251  Weight: 79.4 kg 81.1 kg    Exam:  . General: 68 y.o. year-old male well developed well nourished in no acute distress.  Alert and oriented x3. . Cardiovascular: Regular rate and rhythm with no rubs or gallops.  No thyromegaly or JVD noted.   Marland Kitchen Respiratory: Mild rales at bases with mild diffuse wheezes.  Good inspiratory effort. . Abdomen: Soft nontender nondistended with normal bowel sounds x4 quadrants. . Musculoskeletal: No lower extremity edema. 2/4 pulses in all 4 extremities. . Skin: No ulcerative lesions noted or rashes, . Psychiatry: Mood is appropriate for condition and setting   Data Reviewed: CBC: Recent Labs  Lab 09/10/18 0912 09/11/18 0708 09/12/18 0646 09/13/18 0734  WBC 9.3 4.9 8.1 9.5  NEUTROABS 8.4* 4.5  --   --   HGB 11.2* 9.2* 9.7* 8.9*  HCT 33.3* 28.3* 28.9* 27.3*  MCV 97.7 99.0 100.3* 100.4*  PLT 111* 96* 124* [id number]*   Basic Metabolic Panel: Recent Labs  Lab 09/10/18 0912 09/11/18 0708 09/12/18 0646 09/13/18 0734  NA 130* 133* 134* 131*  K 4.7 3.8 3.9 4.6  CL 94* 104 102 103  CO2 20* 18* 22 17*  GLUCOSE 276* 221* 196* 145*  BUN 36* 34* 41* 40*  CREATININE 1.83* 1.28* 1.33* 1.33*  CALCIUM 8.9 8.3* 8.6* 8.1*  MG  --   --  2.0 1.9   GFR: Estimated Creatinine Clearance: 52.4 mL/min (A) (by C-G formula based on SCr of 1.33 mg/dL (H)). Liver Function Tests: Recent Labs  Lab 09/10/18 0912  AST 22  ALT 19  ALKPHOS 50  BILITOT 2.5*  PROT 6.3*  ALBUMIN 3.0*   No results for input(s): LIPASE, AMYLASE in the last 168 hours. No results for input(s): AMMONIA in the last 168 hours. Coagulation Profile: Recent Labs  Lab 09/10/18 0912  INR 1.3*   Cardiac Enzymes: No results for input(s): CKTOTAL, CKMB, CKMBINDEX, TROPONINI in the last 168 hours. BNP (last 3 results) No results for input(s): PROBNP in the last 8760 hours. HbA1C: Recent Labs    09/10/18 1528  HGBA1C 7.6*   CBG: Recent Labs  Lab 09/12/18 0757 09/12/18 1135  09/12/18 1653 09/12/18 2136 09/13/18 0808  GLUCAP 175* 140* 123* 133* 139*   Lipid Profile: No results for input(s): CHOL, HDL, LDLCALC, TRIG, CHOLHDL, LDLDIRECT in the last 72 hours. Thyroid Function Tests: No results for input(s): TSH, T4TOTAL, FREET4, T3FREE, THYROIDAB in the last 72 hours. Anemia Panel: No results for input(s): VITAMINB12, FOLATE, FERRITIN, TIBC, IRON, RETICCTPCT in the last 72 hours. Urine analysis:    Component Value Date/Time   COLORURINE YELLOW 02/13/2018 [place] 02/13/2018 1735   LABSPEC 1.020 02/13/2018 1735   PHURINE 6.0 02/13/2018 1735   GLUCOSEU >=500 (A) 02/13/2018 1735   HGBUR MODERATE (A) 02/13/2018 1735   BILIRUBINUR NEGATIVE 02/13/2018 1735   BILIRUBINUR neg 09/10/2013 1308   KETONESUR 5 (A) 02/13/2018 1735   PROTEINUR 100 (A) 02/13/2018 1735   UROBILINOGEN 0.2 09/10/2013 1308   NITRITE NEGATIVE 02/13/2018 1735   LEUKOCYTESUR NEGATIVE 02/13/2018 1735   Sepsis Labs: @LABRCNTIP (procalcitonin:4,lacticidven:4)  ) Recent Results (from the past 240 hour(s))  SARS Coronavirus 2 [facility] order, Performed in [facility] hospital lab) Nasopharyngeal Nasopharyngeal Swab     Status: None  Collection Time: 09/10/18  9:12 AM   Specimen: Nasopharyngeal Swab  Result Value Ref Range Status   SARS Coronavirus 2 NEGATIVE NEGATIVE Final    Comment: (NOTE) If result is NEGATIVE SARS-CoV-2 target nucleic acids are NOT DETECTED. The SARS-CoV-2 RNA is generally detectable in upper and lower  respiratory specimens during the acute phase of infection. The lowest  concentration of SARS-CoV-2 viral copies this assay can detect is 250  copies / mL. A negative result does not preclude SARS-CoV-2 infection  and should not be used as the sole basis for treatment or other  patient management decisions.  A negative result may occur with  improper specimen collection / handling, submission of specimen other  than nasopharyngeal swab, presence of  viral mutation(s) within the  areas targeted by this assay, and inadequate number of viral copies  (<250 copies / mL). A negative result must be combined with clinical  observations, patient history, and epidemiological information. If result is POSITIVE SARS-CoV-2 target nucleic acids are DETECTED. The SARS-CoV-2 RNA is generally detectable in upper and lower  respiratory specimens dur ing the acute phase of infection.  Positive  results are indicative of active infection with SARS-CoV-2.  Clinical  correlation with patient history and other diagnostic information is  necessary to determine patient infection status.  Positive results do  not rule out bacterial infection or co-infection with other viruses. If result is PRESUMPTIVE POSTIVE SARS-CoV-2 nucleic acids MAY BE PRESENT.   A presumptive positive result was obtained on the submitted specimen  and confirmed on repeat testing.  While 2019 novel coronavirus  (SARS-CoV-2) nucleic acids may be present in the submitted sample  additional confirmatory testing may be necessary for epidemiological  and / or clinical management purposes  to differentiate between  SARS-CoV-2 and other Sarbecovirus currently known to infect humans.  If clinically indicated additional testing with an alternate test  methodology [phone]) is advised. The SARS-CoV-2 RNA is generally  detectable in upper and lower respiratory sp ecimens during the acute  phase of infection. The expected result is Negative. Fact Sheet for Patients:  StrictlyIdeas.no Fact Sheet for Healthcare Providers: BankingDealers.co.za This test is not yet approved or cleared by the [place] FDA and has been authorized for detection and/or diagnosis of SARS-CoV-2 by FDA under an Emergency Use Authorization (EUA).  This EUA will remain in effect (meaning this test can be used) for the duration of the COVID-19 declaration under Section  564(b)(1) of the Act, 21 U.S.C. section 360bbb-3(b)(1), unless the authorization is terminated or revoked sooner. Performed at [facility], [place] [address], [place], [place] [address]   Blood Culture (routine x 2)     Status: None (Preliminary result)   Collection Time: 09/10/18 10:04 AM   Specimen: BLOOD  Result Value Ref Range Status   Specimen Description BLOOD SITE NOT SPECIFIED  Final   Special Requests   Final    BOTTLES DRAWN AEROBIC AND ANAEROBIC Blood Culture adequate volume   Culture   Final    NO GROWTH 3 DAYS Performed at [facility], [address] [place], [place] [address]    Report Status PENDING  Incomplete  Blood Culture (routine x 2)     Status: None (Preliminary result)   Collection Time: 09/10/18 10:10 AM   Specimen: BLOOD LEFT ARM  Result Value Ref Range Status   Specimen Description BLOOD LEFT ARM  Final   Special Requests   Final    BOTTLES DRAWN AEROBIC  AND ANAEROBIC Blood Culture adequate volume   Culture   Final    NO GROWTH 3 DAYS Performed at [facility], [place] [address], [place], [place] [address]    Report Status PENDING  Incomplete  Respiratory Panel by PCR     Status: None   Collection Time: 09/10/18  1:20 PM   Specimen: Nasopharyngeal Swab; Respiratory  Result Value Ref Range Status   Adenovirus NOT DETECTED NOT DETECTED Final   Coronavirus 229E NOT DETECTED NOT DETECTED Final    Comment: (NOTE) The Coronavirus on the Respiratory Panel, DOES NOT test for the novel  Coronavirus (2019 nCoV)    Coronavirus HKU1 NOT DETECTED NOT DETECTED Final   Coronavirus NL63 NOT DETECTED NOT DETECTED Final   Coronavirus OC43 NOT DETECTED NOT DETECTED Final   Metapneumovirus NOT DETECTED NOT DETECTED Final   Rhinovirus / Enterovirus NOT DETECTED NOT DETECTED Final   Influenza A NOT DETECTED NOT DETECTED Final   Influenza B NOT DETECTED NOT DETECTED Final   Parainfluenza Virus 1 NOT DETECTED NOT DETECTED Final   Parainfluenza Virus 2  NOT DETECTED NOT DETECTED Final   Parainfluenza Virus 3 NOT DETECTED NOT DETECTED Final   Parainfluenza Virus 4 NOT DETECTED NOT DETECTED Final   Respiratory Syncytial Virus NOT DETECTED NOT DETECTED Final   Bordetella pertussis NOT DETECTED NOT DETECTED Final   Chlamydophila pneumoniae NOT DETECTED NOT DETECTED Final   Mycoplasma pneumoniae NOT DETECTED NOT DETECTED Final    Comment: Performed at [facility], [place]. [address], [place], [place] [address]      Studies: No results found.  Scheduled Meds: . aspirin EC  81 mg Oral Daily  . atenolol  12.5 mg Oral Daily  . atorvastatin  20 mg Oral QHS  . gabapentin  300 mg Oral q morning - 10a  . gabapentin  600 mg Oral QHS  . guaiFENesin  600 mg Oral BID  . insulin aspart  0-15 Units Subcutaneous TID WC  . insulin aspart  0-5 Units Subcutaneous QHS  . insulin detemir  8 Units Subcutaneous BID  . ipratropium-albuterol  3 mL Nebulization TID  . methylPREDNISolone (SOLU-MEDROL) injection  60 mg Intravenous Q12H  . pantoprazole  40 mg Oral BID  . senna-docusate  1 tablet Oral QHS  . sertraline  50 mg Oral Daily  . traZODone  50 mg Oral QHS    Continuous Infusions: . azithromycin 500 mg (09/13/18 0852)  . ceFEPime (MAXIPIME) IV 2 g (09/13/18 0522)     LOS: 3 days     Kayleen Memos, MD [facility] Pager [phone]  If 7PM-7AM, please contact night-coverage [email or web address] Password [email or web address] 09/13/2018, 10:01 AM

## 2018-09-13 NOTE — Progress Notes (Signed)
Repeat covid test completed due to elevated Covid markers and worsening PNA per imaging and increase in O2 requirements. Test resulted negative. Discussed transferring pt to Covid side of 2W with Hall DO. Per [place] Crane DO, Infectious Disease was consulted and recommended against moving pt to isolation wing given second negative covid test. Pt will remain on 2W pulmonary care non-covid side with standard isolation precautions only. Universal masking and mandatory face shield policy will be respected.

## 2018-09-13 NOTE — Progress Notes (Signed)
VASCULAR LAB PRELIMINARY  PRELIMINARY  PRELIMINARY  PRELIMINARY  Bilateral lower extremity venous duplex completed.    Preliminary report:  See CV proc for preliminary results.   Mason Burleigh, RVT 09/13/2018, 6:56 PM

## 2018-09-13 NOTE — Progress Notes (Signed)
Resting comfortably throughout shift. No resp distress, continues on 4L Oxygen to maintain sats greater than 90. Noted to have some what intermittent moist cough-non productive. IV ABT continues via peripheral access-well tolerated.

## 2018-09-13 NOTE — Plan of Care (Signed)
  Problem: Education: Goal: Knowledge of General Education information will improve Description: Including pain rating scale, medication(s)/side effects and non-pharmacologic comfort measures Outcome: Progressing   Problem: Activity: Goal: Risk for activity intolerance will decrease Outcome: Progressing   Problem: Nutrition: Goal: Adequate nutrition will be maintained Outcome: Progressing   

## 2018-09-14 ENCOUNTER — Inpatient Hospital Stay (HOSPITAL_COMMUNITY): Payer: Medicare Other

## 2018-09-14 DIAGNOSIS — R0602 Shortness of breath: Secondary | ICD-10-CM

## 2018-09-14 LAB — PHOSPHORUS: Phosphorus: 3 mg/dL (ref 2.5–4.6)

## 2018-09-14 LAB — GLUCOSE, CAPILLARY
Glucose-Capillary: 190 mg/dL — ABNORMAL HIGH (ref 70–99)
Glucose-Capillary: 254 mg/dL — ABNORMAL HIGH (ref 70–99)
Glucose-Capillary: 170 mg/dL — ABNORMAL HIGH (ref 70–99)
Glucose-Capillary: 129 mg/dL — ABNORMAL HIGH (ref 70–99)

## 2018-09-14 LAB — MAGNESIUM: Magnesium: 2.1 mg/dL (ref 1.7–2.4)

## 2018-09-14 LAB — ECHOCARDIOGRAM COMPLETE
Height: 69 in
Weight: 2860.8 oz

## 2018-09-14 MED ORDER — IPRATROPIUM BROMIDE 0.02 % IN SOLN
0.5000 mg | Freq: Four times a day (QID) | RESPIRATORY_TRACT | Status: DC
  Administered 2018-09-14 – 2018-09-19 (×21): 0.5 mg via RESPIRATORY_TRACT
  Filled 2018-09-14 (×23): qty 2.5

## 2018-09-14 MED ORDER — ATENOLOL 25 MG PO TABS
12.5000 mg | ORAL_TABLET | Freq: Every day | ORAL | Status: DC
  Administered 2018-09-15 – 2018-09-17 (×3): 12.5 mg via ORAL
  Filled 2018-09-14 (×3): qty 1

## 2018-09-14 MED ORDER — LEVALBUTEROL HCL 0.63 MG/3ML IN NEBU
0.6300 mg | INHALATION_SOLUTION | Freq: Four times a day (QID) | RESPIRATORY_TRACT | Status: DC
  Administered 2018-09-14 – 2018-09-19 (×21): 0.63 mg via RESPIRATORY_TRACT
  Filled 2018-09-14 (×23): qty 3

## 2018-09-14 NOTE — Progress Notes (Signed)
PROGRESS NOTE  Adrian Andrews [id number] DOB: 1950-06-04 DOA: 09/10/2018 PCP: [name], MD  HPI/Recap of past 33 hours: 68 year old male with history of CVA, carotid artery stenosis/stent, DM-2, CKD 3, HTN, HLD, COPD-3L and former smoker presenting with fever, productive cough, shortness of breath and diarrhea and admitted for sepsis due to RLL pneumonia, and AKI.  In ED, soft blood pressures.  Lactic acidosis.  CXR revealed RLL pneumonia.  COVID-19 negative. Started on ceftriaxone, azithromycin and a steroid.  PCCM consulted given soft blood pressure and uptrending lactic acidosis and recommended hydration.  09/13/18: Patient seen and examined at bedside this morning.  Worsening hypoxia with increased oxygen requirement.  Now on 8 L high flow nasal cannula.  Will obtain stat ABG and stat chest x-ray.  He denies chest pain.  Mild to moderate increase work of breathing.  Admits to persistent productive cough.  09/14/18: Patient was seen and examined at his bedside this morning.  Reports improvement of his breathing this morning with diuretics.  States his cough is also improved.  Denies chest pain.  Significant change and increase in his BNP from 358 on 02/13/18 to greater than 2300 on 09/13/2018.  Obtained 2D echo, pending results.   Assessment/Plan: Principal Problem:   Sepsis due to pneumonia [facility]) Active Problems:   AKI (acute kidney injury) ([place])   COPD (chronic obstructive pulmonary disease) (HCC)   Type 2 diabetes mellitus with other specified complication (HCC)   CKD (chronic kidney disease) stage 3, GFR 30-59 ml/min (HCC)   RLL pneumonia (HCC)   Essential hypertension   Dyslipidemia   Sepsis secondary to right lower lobe community-acquired pneumonia Presented with tachypnea and tachycardia with right lower lobe infiltrates on chest x-ray done on 09/10/2018. T-max 99.6 overnight Procalcitonin improvement but still elevated 5 (09/12/2018) from 7 Continue cefepime and  azithromycin, day #4 Repeat procalcitonin in the morning Continue to monitor fever curve and WBC Repeat CBC with differential in the morning Blood cultures negative x3 days MRSA negative Respiratory panel negative COVID-19 negative x2 during this admission  Community-acquired pneumonia, POA Management as stated above Mucinex for cough as needed  Elevated d-dimer D-dimer 11 on 09/13/2018 Negative for PE Negative DVT in lower extremities bilaterally COVID 19-x2 during this admission  Significantly elevated BNP with concern for acute on chronic diastolic CHF BNP from [id number] on 02/13/2018 to greater than 2300 on 09/13/2018 2D echo ordered and pending Started on IV Lasix 40 mg daily Net I&O -4.6 L since admission Continue strict I's and O's and daily weight Closely monitor blood pressure, electrolytes and renal function while on diuretics  Acute on chronic hypoxic respiratory failure suspect multifactorial secondary to CAP Vs suspected acute on chronic diastolic CHF On 2 L of oxygen as needed at baseline Oxygen requirement is decreasing while on diuretics Continue to maintain O2 saturation greater than 92% Continue bronchodilators, Solu-Medrol 60 mg twice daily Continue IV Lasix 40 mg daily  CKD 3 Appears to be at his baseline creatinine 1.3 with GFR 54 Avoid nephrotoxins Monitor urine output Repeat BMP in the morning  Anemia of chronic disease/macrocytic anemia Hemoglobin dropped this morning from 9.7-8.9 MCV of 100.4 No sign of overt bleeding Obtain iron studies, add on Repeat CBC in the morning  Mild thrombocytopenia Presented with platelet count of 111 Platelet count is improving up to 136K this morning  Prior polysubstance abuse with benzodiazepine and cocaine Last UDS on 05/04/2016+ for benzodiazepine and cocaine No repeated UDS during this admission  Chronic anxiety/depression  Continue Zoloft, trazodone, Neurontin  COPD Continue inhalers  Hypertension Blood  pressure is currently soft Continue to hold off atenolol Continue IV diuretics due to pulmonary edema impairing respiratory stability Maintain map greater than 65 and continue to monitor vital signs closely  Type 2 diabetes with hyperglycemia Hemoglobin A1c 7.6 on 09/10/2018 Continue insulin sliding scale Continue Levemir Avoid hypoglycemia    Code Status: DNR  Family Communication: None at bedside  Disposition Plan: Undetermined at this time.  2D echo pending.  Plan to DC once respiratory status is close to baseline.   Consultants:  PCCM  Procedures:  None  Antimicrobials:  Cefepime and azithromycin  DVT prophylaxis: Subcu Lovenox daily.   Objective: Vitals:   09/14/18 0300 09/14/18 0759 09/14/18 0814 09/14/18 1000  BP: 101/65  119/79   Pulse: 85     Resp: 16     Temp: 98.8 F (37.1 C)  97.8 F (36.6 C)   TempSrc: Axillary  Oral   SpO2: 96% 100%  95%  Weight:      Height:        Intake/Output Summary (Last 24 hours) at 09/14/2018 1127 Last data filed at 09/14/2018 1011 Gross per 24 hour  Intake 240 ml  Output 3800 ml  Net -3560 ml   Filed Weights   09/10/18 0858 09/10/18 1251  Weight: 79.4 kg 81.1 kg    Exam:   General: 68 y.o. year-old male well-developed well-nourished in no acute distress.  Alert and oriented x4.    Cardiovascular: Regular rate and rhythm no rubs or gallops.  No JVD or thyromegaly noted.    Respiratory: Very mild rales noted at bases.  No wheezes noted.  Poor respiratory effort.    Abdomen: Soft nontender nondistended normal bowel sounds present.   Musculoskeletal: Trace lower extremity edema.  2 out of 4 pulses in all 4 extremities.  Psychiatry: Mood is appropriate for condition and setting.   Data Reviewed: CBC: Recent Labs  Lab 09/10/18 0912 09/11/18 0708 09/12/18 0646 09/13/18 0734  WBC 9.3 4.9 8.1 9.5  NEUTROABS 8.4* 4.5  --   --   HGB 11.2* 9.2* 9.7* 8.9*  HCT 33.3* 28.3* 28.9* 27.3*  MCV 97.7 99.0  100.3* 100.4*  PLT 111* 96* 124* [id number]*   Basic Metabolic Panel: Recent Labs  Lab 09/10/18 0912 09/11/18 0708 09/12/18 0646 09/13/18 0734 09/14/18 0647  NA 130* 133* 134* 131*  --   K 4.7 3.8 3.9 4.6  --   CL 94* 104 102 103  --   CO2 20* 18* 22 17*  --   GLUCOSE 276* 221* 196* 145*  --   BUN 36* 34* 41* 40*  --   CREATININE 1.83* 1.28* 1.33* 1.33*  --   CALCIUM 8.9 8.3* 8.6* 8.1*  --   MG  --   --  2.0 1.9 2.1  PHOS  --   --   --   --  3.0   GFR: Estimated Creatinine Clearance: 52.4 mL/min (A) (by C-G formula based on SCr of 1.33 mg/dL (H)). Liver Function Tests: Recent Labs  Lab 09/10/18 0912  AST 22  ALT 19  ALKPHOS 50  BILITOT 2.5*  PROT 6.3*  ALBUMIN 3.0*   No results for input(s): LIPASE, AMYLASE in the last 168 hours. No results for input(s): AMMONIA in the last 168 hours. Coagulation Profile: Recent Labs  Lab 09/10/18 0912  INR 1.3*   Cardiac Enzymes: No results for input(s): CKTOTAL, CKMB, CKMBINDEX, TROPONINI in the last 168  hours. BNP (last 3 results) No results for input(s): PROBNP in the last 8760 hours. HbA1C: No results for input(s): HGBA1C in the last 72 hours. CBG: Recent Labs  Lab 09/13/18 0808 09/13/18 1159 09/13/18 1658 09/13/18 2124 09/14/18 0816  GLUCAP 139* 145* 160* 177* 190*   Lipid Profile: No results for input(s): CHOL, HDL, LDLCALC, TRIG, CHOLHDL, LDLDIRECT in the last 72 hours. Thyroid Function Tests: No results for input(s): TSH, T4TOTAL, FREET4, T3FREE, THYROIDAB in the last 72 hours. Anemia Panel: Recent Labs    09/13/18 1522  FERRITIN 593*   Urine analysis:    Component Value Date/Time   COLORURINE YELLOW 02/13/2018 [place] 02/13/2018 1735   LABSPEC 1.020 02/13/2018 1735   PHURINE 6.0 02/13/2018 1735   GLUCOSEU >=500 (A) 02/13/2018 1735   HGBUR MODERATE (A) 02/13/2018 1735   BILIRUBINUR NEGATIVE 02/13/2018 1735   BILIRUBINUR neg 09/10/2013 1308   KETONESUR 5 (A) 02/13/2018 1735    PROTEINUR 100 (A) 02/13/2018 1735   UROBILINOGEN 0.2 09/10/2013 1308   NITRITE NEGATIVE 02/13/2018 1735   LEUKOCYTESUR NEGATIVE 02/13/2018 1735   Sepsis Labs: @LABRCNTIP (procalcitonin:4,lacticidven:4)  ) Recent Results (from the past 240 hour(s))  SARS Coronavirus 2 [facility] order, Performed in [facility] hospital lab) Nasopharyngeal Nasopharyngeal Swab     Status: None   Collection Time: 09/10/18  9:12 AM   Specimen: Nasopharyngeal Swab  Result Value Ref Range Status   SARS Coronavirus 2 NEGATIVE NEGATIVE Final    Comment: (NOTE) If result is NEGATIVE SARS-CoV-2 target nucleic acids are NOT DETECTED. The SARS-CoV-2 RNA is generally detectable in upper and lower  respiratory specimens during the acute phase of infection. The lowest  concentration of SARS-CoV-2 viral copies this assay can detect is 250  copies / mL. A negative result does not preclude SARS-CoV-2 infection  and should not be used as the sole basis for treatment or other  patient management decisions.  A negative result may occur with  improper specimen collection / handling, submission of specimen other  than nasopharyngeal swab, presence of viral mutation(s) within the  areas targeted by this assay, and inadequate number of viral copies  (<250 copies / mL). A negative result must be combined with clinical  observations, patient history, and epidemiological information. If result is POSITIVE SARS-CoV-2 target nucleic acids are DETECTED. The SARS-CoV-2 RNA is generally detectable in upper and lower  respiratory specimens dur ing the acute phase of infection.  Positive  results are indicative of active infection with SARS-CoV-2.  Clinical  correlation with patient history and other diagnostic information is  necessary to determine patient infection status.  Positive results do  not rule out bacterial infection or co-infection with other viruses. If result is PRESUMPTIVE POSTIVE SARS-CoV-2 nucleic acids MAY BE  PRESENT.   A presumptive positive result was obtained on the submitted specimen  and confirmed on repeat testing.  While 2019 novel coronavirus  (SARS-CoV-2) nucleic acids may be present in the submitted sample  additional confirmatory testing may be necessary for epidemiological  and / or clinical management purposes  to differentiate between  SARS-CoV-2 and other Sarbecovirus currently known to infect humans.  If clinically indicated additional testing with an alternate test  methodology [phone]) is advised. The SARS-CoV-2 RNA is generally  detectable in upper and lower respiratory sp ecimens during the acute  phase of infection. The expected result is Negative. Fact Sheet for Patients:  StrictlyIdeas.no Fact Sheet for Healthcare Providers: BankingDealers.co.za This test is not yet approved or  cleared by the [place] and has been authorized for detection and/or diagnosis of SARS-CoV-2 by FDA under an Emergency Use Authorization (EUA).  This EUA will remain in effect (meaning this test can be used) for the duration of the COVID-19 declaration under Section 564(b)(1) of the Act, 21 U.S.C. section 360bbb-3(b)(1), unless the authorization is terminated or revoked sooner. Performed at [facility], [place] [address], [place], [place] [address]   Blood Culture (routine x 2)     Status: None (Preliminary result)   Collection Time: 09/10/18 10:04 AM   Specimen: BLOOD  Result Value Ref Range Status   Specimen Description BLOOD SITE NOT SPECIFIED  Final   Special Requests   Final    BOTTLES DRAWN AEROBIC AND ANAEROBIC Blood Culture adequate volume   Culture   Final    NO GROWTH 4 DAYS Performed at [facility], [place] [address] [place], [place] [address]    Report Status PENDING  Incomplete  Blood Culture (routine x 2)     Status: None (Preliminary result)   Collection Time: 09/10/18 10:10 AM   Specimen: BLOOD LEFT ARM    Result Value Ref Range Status   Specimen Description BLOOD LEFT ARM  Final   Special Requests   Final    BOTTLES DRAWN AEROBIC AND ANAEROBIC Blood Culture adequate volume   Culture   Final    NO GROWTH 4 DAYS Performed at [facility], [place] [address] [place], [place] [address]    Report Status PENDING  Incomplete  Respiratory Panel by PCR     Status: None   Collection Time: 09/10/18  1:20 PM   Specimen: Nasopharyngeal Swab; Respiratory  Result Value Ref Range Status   Adenovirus NOT DETECTED NOT DETECTED Final   Coronavirus 229E NOT DETECTED NOT DETECTED Final    Comment: (NOTE) The Coronavirus on the Respiratory Panel, DOES NOT test for the novel  Coronavirus (2019 nCoV)    Coronavirus HKU1 NOT DETECTED NOT DETECTED Final   Coronavirus NL63 NOT DETECTED NOT DETECTED Final   Coronavirus OC43 NOT DETECTED NOT DETECTED Final   Metapneumovirus NOT DETECTED NOT DETECTED Final   Rhinovirus / Enterovirus NOT DETECTED NOT DETECTED Final   Influenza A NOT DETECTED NOT DETECTED Final   Influenza B NOT DETECTED NOT DETECTED Final   Parainfluenza Virus 1 NOT DETECTED NOT DETECTED Final   Parainfluenza Virus 2 NOT DETECTED NOT DETECTED Final   Parainfluenza Virus 3 NOT DETECTED NOT DETECTED Final   Parainfluenza Virus 4 NOT DETECTED NOT DETECTED Final   Respiratory Syncytial Virus NOT DETECTED NOT DETECTED Final   Bordetella pertussis NOT DETECTED NOT DETECTED Final   Chlamydophila pneumoniae NOT DETECTED NOT DETECTED Final   Mycoplasma pneumoniae NOT DETECTED NOT DETECTED Final    Comment: Performed at [facility], [place]. [address] [place], [place] [address]  SARS Coronavirus 2 [facility] order, Performed in [facility] hospital lab)     Status: None   Collection Time: 09/13/18  3:43 PM  Result Value Ref Range Status   SARS Coronavirus 2 NEGATIVE NEGATIVE Final    Comment: (NOTE) If result is NEGATIVE SARS-CoV-2 target nucleic acids are NOT DETECTED. The SARS-CoV-2 RNA is  generally detectable in upper and lower  respiratory specimens during the acute phase of infection. The lowest  concentration of SARS-CoV-2 viral copies this assay can detect is 250  copies / mL. A negative result does not preclude SARS-CoV-2 infection  and should not  be used as the sole basis for treatment or other  patient management decisions.  A negative result may occur with  improper specimen collection / handling, submission of specimen other  than nasopharyngeal swab, presence of viral mutation(s) within the  areas targeted by this assay, and inadequate number of viral copies  (<250 copies / mL). A negative result must be combined with clinical  observations, patient history, and epidemiological information. If result is POSITIVE SARS-CoV-2 target nucleic acids are DETECTED. The SARS-CoV-2 RNA is generally detectable in upper and lower  respiratory specimens dur ing the acute phase of infection.  Positive  results are indicative of active infection with SARS-CoV-2.  Clinical  correlation with patient history and other diagnostic information is  necessary to determine patient infection status.  Positive results do  not rule out bacterial infection or co-infection with other viruses. If result is PRESUMPTIVE POSTIVE SARS-CoV-2 nucleic acids MAY BE PRESENT.   A presumptive positive result was obtained on the submitted specimen  and confirmed on repeat testing.  While 2019 novel coronavirus  (SARS-CoV-2) nucleic acids may be present in the submitted sample  additional confirmatory testing may be necessary for epidemiological  and / or clinical management purposes  to differentiate between  SARS-CoV-2 and other Sarbecovirus currently known to infect humans.  If clinically indicated additional testing with an alternate test  methodology [phone]) is advised. The SARS-CoV-2 RNA is generally  detectable in upper and lower respiratory sp ecimens during the acute  phase of  infection. The expected result is Negative. Fact Sheet for Patients:  StrictlyIdeas.no Fact Sheet for Healthcare Providers: BankingDealers.co.za This test is not yet approved or cleared by the [place] FDA and has been authorized for detection and/or diagnosis of SARS-CoV-2 by FDA under an Emergency Use Authorization (EUA).  This EUA will remain in effect (meaning this test can be used) for the duration of the COVID-19 declaration under Section 564(b)(1) of the Act, 21 U.S.C. section 360bbb-3(b)(1), unless the authorization is terminated or revoked sooner. Performed at [facility], [place] [address], [place], [place] [address]   MRSA PCR Screening     Status: None   Collection Time: 09/13/18  6:11 PM   Specimen: Nasal Mucosa; Nasopharyngeal  Result Value Ref Range Status   MRSA by PCR NEGATIVE NEGATIVE Final    Comment:        The GeneXpert MRSA Assay (FDA approved for NASAL specimens only), is one component of a comprehensive MRSA colonization surveillance program. It is not intended to diagnose MRSA infection nor to guide or monitor treatment for MRSA infections. Performed at [facility], [place] [address], [place],  [address]   Respiratory Panel by PCR     Status: None   Collection Time: 09/13/18  6:12 PM   Specimen: Nasopharyngeal Swab; Respiratory  Result Value Ref Range Status   Adenovirus NOT DETECTED NOT DETECTED Final   Coronavirus 229E NOT DETECTED NOT DETECTED Final    Comment: (NOTE) The Coronavirus on the Respiratory Panel, DOES NOT test for the novel  Coronavirus (2019 nCoV)    Coronavirus HKU1 NOT DETECTED NOT DETECTED Final   Coronavirus NL63 NOT DETECTED NOT DETECTED Final   Coronavirus OC43 NOT DETECTED NOT DETECTED Final   Metapneumovirus NOT DETECTED NOT DETECTED Final   Rhinovirus / Enterovirus NOT DETECTED NOT DETECTED Final   Influenza A NOT DETECTED NOT DETECTED Final   Influenza  B NOT DETECTED NOT DETECTED Final   Parainfluenza Virus 1 NOT  DETECTED NOT DETECTED Final   Parainfluenza Virus 2 NOT DETECTED NOT DETECTED Final   Parainfluenza Virus 3 NOT DETECTED NOT DETECTED Final   Parainfluenza Virus 4 NOT DETECTED NOT DETECTED Final   Respiratory Syncytial Virus NOT DETECTED NOT DETECTED Final   Bordetella pertussis NOT DETECTED NOT DETECTED Final   Chlamydophila pneumoniae NOT DETECTED NOT DETECTED Final   Mycoplasma pneumoniae NOT DETECTED NOT DETECTED Final    Comment: Performed at [facility], [place] [address] [place], [place] [address]      Studies: Ct Chest Wo Contrast  Result Date: 09/13/2018 CLINICAL DATA:  Interstitial lung disease. EXAM: CT CHEST WITHOUT CONTRAST TECHNIQUE: Multidetector CT imaging of the chest was performed following the standard protocol without IV contrast. COMPARISON:  Chest x-ray September 13, 2018. FINDINGS: Cardiovascular: The thoracic aorta is nonaneurysmal with atherosclerotic change. Central pulmonary arteries are normal in caliber. Coronary artery calcifications affect the left and right coronary arteries. The heart size borderline. Mediastinum/Nodes: The thyroid and esophagus are unremarkable. Shotty nodes in the mediastinum without gross adenopathy are likely reactive. Small pleural effusions are seen, right greater than left. The chest wall is normal. Lungs/Pleura: Mild debris dependently in the trachea. Central airways are otherwise normal. No pneumothorax. Emphysematous changes are seen in the lungs. Focal infiltrate is seen in the posterior left upper lobe adjacent to the fissure. More significant infiltrate is seen in the right middle and lower lobes with air bronchograms. No nodules or masses are noted. Upper Abdomen: No acute abnormality. Musculoskeletal: No chest wall mass or suspicious bone lesions identified. IMPRESSION: 1. Significant infiltrate in the right middle and lower lobes worrisome for pneumonia. More mild  infiltrate is seen in the posterior left upper lobe. Recommend short-term follow-up imaging to ensure resolution. 2. Atherosclerotic change in the thoracic aorta. Coronary artery calcifications. Emphysema. 3. Emphysema. Aortic Atherosclerosis (ICD10-I70.0) and Emphysema (ICD10-J43.9). Electronically Signed   By: Dorise Bullion III M.D   On: 09/13/2018 15:57   Vas [place] Lower Extremity Venous (dvt)  Result Date: 09/13/2018  Lower Venous Study Indications: Edema, and Elevated D-Dimer.  Comparison Study: Prior study from 02/14/18 is available for comparison. Performing Technologist: Sharion Dove RVS  Examination Guidelines: A complete evaluation includes B-mode imaging, spectral Doppler, color Doppler, and power Doppler as needed of all accessible portions of each vessel. Bilateral testing is considered an integral part of a complete examination. Limited examinations for reoccurring indications may be performed as noted.  +---------+---------------+---------+-----------+----------+--------------+  RIGHT     Compressibility Phasicity Spontaneity Properties Thrombus Aging  +---------+---------------+---------+-----------+----------+--------------+  CFV       Full                                             Pulsatile       +---------+---------------+---------+-----------+----------+--------------+  SFJ       Full                                                             +---------+---------------+---------+-----------+----------+--------------+  FV Prox   Full                                                             +---------+---------------+---------+-----------+----------+--------------+  FV Mid    Full                                                             +---------+---------------+---------+-----------+----------+--------------+  FV Distal Full                                                             +---------+---------------+---------+-----------+----------+--------------+  PFV       Full                                                              +---------+---------------+---------+-----------+----------+--------------+  POP       Full                                             Pulsatile       +---------+---------------+---------+-----------+----------+--------------+  PTV       Full                                                             +---------+---------------+---------+-----------+----------+--------------+  PERO      Full                                                             +---------+---------------+---------+-----------+----------+--------------+   +---------+---------------+---------+-----------+----------+--------------+  LEFT      Compressibility Phasicity Spontaneity Properties Thrombus Aging  +---------+---------------+---------+-----------+----------+--------------+  CFV       Full                                             Pulsatile       +---------+---------------+---------+-----------+----------+--------------+  SFJ       Full                                                             +---------+---------------+---------+-----------+----------+--------------+  FV Prox   Full                                                             +---------+---------------+---------+-----------+----------+--------------+  FV Mid    Full                                                             +---------+---------------+---------+-----------+----------+--------------+  FV Distal Full                                                             +---------+---------------+---------+-----------+----------+--------------+  PFV       Full                                                             +---------+---------------+---------+-----------+----------+--------------+  POP       Full                                             Pulsatile       +---------+---------------+---------+-----------+----------+--------------+  PTV       Full                                                              +---------+---------------+---------+-----------+----------+--------------+  PERO      Full                                                             +---------+---------------+---------+-----------+----------+--------------+     Summary: Right: Findings appear essentially unchanged compared to previous examination. There is no evidence of deep vein thrombosis in the lower extremity. Left: Findings appear essentially unchanged compared to previous examination. There is no evidence of deep vein thrombosis in the lower extremity.  *See table(s) above for measurements and observations. Electronically signed by Deitra Mayo MD on 09/13/2018 at 7:22:57 PM.    Final     Scheduled Meds:  aspirin EC  81 mg Oral Daily   [START ON 09/15/2018] atenolol  12.5 mg Oral Daily   atorvastatin  20 mg Oral QHS   enoxaparin (LOVENOX) injection  40 mg Subcutaneous Daily   fluticasone  2 spray Each Nare Daily   furosemide  40 mg Intravenous Daily   gabapentin  300 mg Oral q morning - 10a   gabapentin  600 mg Oral QHS   guaiFENesin  600 mg Oral BID   insulin aspart  0-15 Units Subcutaneous TID WC   insulin aspart  0-5 Units Subcutaneous QHS   insulin detemir  8 Units Subcutaneous BID   ipratropium  0.5 mg Inhalation Q6H   levalbuterol  0.63 mg Inhalation Q6H   pantoprazole  40 mg Oral BID   senna-docusate  1 tablet Oral QHS   sertraline  50 mg Oral Daily   traZODone  50 mg Oral QHS    Continuous Infusions:  azithromycin 500 mg (09/14/18 1011)   ceFEPime (MAXIPIME) IV 2 g (09/14/18 0543)     LOS: 4 days     Kayleen Memos, MD [facility] Pager [phone]  If 7PM-7AM, please contact night-coverage [email or web address] Password [facility] 09/14/2018, 11:27 AM

## 2018-09-14 NOTE — Progress Notes (Signed)
Encourage to keep [place] in place secondary to desaturation, patient verbalized understading.

## 2018-09-14 NOTE — Progress Notes (Signed)
No respiratory distress, Oxygen saturation maintained greater than 90 most of shift thus far on 4L oxygen via [place]. Continues to verbailze concern over social/finacial situation , made made aware social worker consult in place for patient in AM.

## 2018-09-14 NOTE — Progress Notes (Signed)
  Echocardiogram 2D Echocardiogram has been performed.  Bobbye Charleston 09/14/2018, 11:32 AM

## 2018-09-14 NOTE — Care Management Important Message (Signed)
Important Message  Patient Details  Name: Adrian Andrews MRN: [id number] Date of Birth: 1950/05/14   Medicare Important Message Given:  Yes     [name] 09/14/2018, 1:35 PM

## 2018-09-14 NOTE — Evaluation (Signed)
Clinical/Bedside Swallow Evaluation Patient Details  Name: Adrian Andrews MRN: [id number] Date of Birth: September 10, 1950  Today's Date: 09/14/2018 Time: SLP Start Time (ACUTE ONLY): 0930 SLP Stop Time (ACUTE ONLY): 0945 SLP Time Calculation (min) (ACUTE ONLY): 15 min  Past Medical History:  Past Medical History:  Diagnosis Date  . Anxiety   . Arthritis    back, right hip  . Chronic kidney disease    stage 3  . COPD (chronic obstructive pulmonary disease) ([place])    on 3L home O2  . Diabetes mellitus without complication ([place])   . Dyspnea   . Heart murmur   . Hyperlipidemia   . Hypertension   . Neuropathy   . Peripheral vascular disease ([place])    Carotid artery stenosis, s/p bilat stents  . Skin cancer    basal cell right cheek, left ear  . Stroke [facility])    noted on MRI   Past Surgical History:  Past Surgical History:  Procedure Laterality Date  . ENDARTERECTOMY Left 02/21/2017   Procedure: LEFT CAROTID ENDARTERECTOMY;  Surgeon: Rosetta Posner, MD;  Location: [facility] OR;  Service: Vascular;  Laterality: Left;  . lower extremity stenting    . PATCH ANGIOPLASTY Left 02/21/2017   Procedure: PATCH ANGIOPLASTY OF LEFT CAROTID ARTERY USING HEMASHIELD PLATINUM FINESSE PATCH;  Surgeon: Rosetta Posner, MD;  Location: [place];  Service: Vascular;  Laterality: Left;   HPI:  Adrian Andrews is a 69 y.o. male admitted 09/10/2018 with fever, SOB, and productive cough. PMH: CVA, carotid stenosis s/p B stents; HTN; HLD; DM; COPD on 3L home O2; stage 3 CKD, chronic back pain; No prior SLP intervention found. CXR = RLL PNA   Assessment / Plan / Recommendation Clinical Impression  Pt has baseline coughing, noted also throughout PO trials. He has only a mild throat clear after consuming three ounces of water consecutively, suggestive of lower aspiration risk. He reports that he has only had PNA once before (many years ago) and that he has not had any h/o dysphagia since his stroke, which appears to have been last  year. Recommend continuing regular solids and thin liquids with education provided on the importance of using aspration precautions and energy conservation strategies during periods of respiratory compromise. SLP f/u warranted in the setting of RLL PNA and baseline risk factors.  SLP Visit Diagnosis: Dysphagia, unspecified (R13.10)    Aspiration Risk  Mild aspiration risk    Diet Recommendation Regular;Thin liquid   Liquid Administration via: Cup;Straw Medication Administration: Whole meds with liquid Supervision: Patient able to self feed;Intermittent supervision to cue for compensatory strategies Compensations: Slow rate;Small sips/bites;Other (Comment)(take breaks PRN) Postural Changes: Seated upright at 90 degrees    Other  Recommendations Oral Care Recommendations: Oral care BID   Follow up Recommendations None      Frequency and Duration min 2x/week  1 week       Prognosis Prognosis for Safe Diet Advancement: Good      Swallow Study   General HPI: Adrian Andrews is a 68 y.o. male admitted 09/10/2018 with fever, SOB, and productive cough. PMH: CVA, carotid stenosis s/p B stents; HTN; HLD; DM; COPD on 3L home O2; stage 3 CKD, chronic back pain; No prior SLP intervention found. CXR = RLL PNA Type of Study: Bedside Swallow Evaluation Previous Swallow Assessment: none found Diet Prior to this Study: Regular;Thin liquids Temperature Spikes Noted: Yes(100.3) Respiratory Status: Nasal cannula History of Recent Intubation: No Behavior/Cognition: Alert;Cooperative Oral Cavity Assessment: Within Functional  Limits Oral Care Completed by SLP: No Oral Cavity - Dentition: Dentures, top;Dentures, bottom Vision: Functional for self-feeding Self-Feeding Abilities: Able to feed self Patient Positioning: Upright in bed Baseline Vocal Quality: Normal Volitional Cough: Strong Volitional Swallow: Able to elicit    Oral/Motor/Sensory Function Overall Oral Motor/Sensory Function: Within  functional limits   Ice Chips Ice chips: Not tested   Thin Liquid Thin Liquid: Within functional limits Presentation: Cup;Self Fed;Straw    Nectar Thick Nectar Thick Liquid: Not tested   Honey Thick Honey Thick Liquid: Not tested   Puree Puree: Within functional limits Presentation: Self Fed;Spoon   Solid     Solid: Within functional limits Presentation: [place] 09/14/2018,10:23 AM  Pollyann Glen, M.A. [place] Acute Environmental education officer [phone] Office [phone]

## 2018-09-14 NOTE — Progress Notes (Signed)
Physical Therapy Treatment Patient Details Name: Adrian Andrews MRN: [id number] DOB: Jun 05, 1950 Today's Date: 09/14/2018    History of Present Illness Adrian Andrews is a 68 y.o. male with medical history significant of CVA; carotid stenosis s/p B stents; HTN; HLD; DM; COPD on 2L home O2; and stage 3 CKD presenting with fever, cough, and diarrhea. Found to have Sepsis due to pneumonia    PT Comments    Patient progressing slowly due to continued dyspnea and anxiety with exertion.  SpO2 WNL and lower O2 requirement.  Still very limited with activity tolerance but able to participate in standing marching and OOB to [facility].  Patient remains appropriate for SNF level rehab at d/c.   Follow Up Recommendations  SNF     Equipment Recommendations  Other (comment)(TBA)    Recommendations for Other Services       Precautions / Restrictions Precautions Precautions: Fall Precaution Comments: watch O2 Restrictions Weight Bearing Restrictions: No    Mobility  Bed Mobility Overal bed mobility: Needs Assistance Bed Mobility: Sit to Supine     Supine to sit: Min assist Sit to supine: Min guard   General bed mobility comments: pulled up with HHA to sit, to supine assist for positioning  Transfers Overall transfer level: Needs assistance Equipment used: Rolling walker (2 wheeled);None Transfers: Sit to/from [employer] to Stand: Min assist Stand pivot transfers: Min assist       General transfer comment: up to [facility] without device, stood and used RW while standing for hygiene and to take steps up toward [facility]  Ambulation/Gait Ambulation/Gait assistance: Min assist Gait Distance (Feet): 2 Feet Assistive device: Rolling walker (2 wheeled) Gait Pattern/deviations: Step-to pattern;Shuffle     General Gait Details: side stepping toward HOB with RW and A for balance   Stairs             Wheelchair Mobility    Modified Rankin (Stroke Patients Only)       Balance Overall balance assessment: Needs assistance Sitting-balance support: No upper extremity supported;Feet supported Sitting balance-Leahy Scale: Good     Standing balance support: Bilateral upper extremity supported Standing balance-Leahy Scale: Poor Standing balance comment: min a or bilat UE support for balance                            Cognition Arousal/Alertness: Awake/alert Behavior During Therapy: Anxious Overall Cognitive Status: Within Functional Limits for tasks assessed                                        Exercises General Exercises - Lower Extremity Hip Flexion/Marching: Standing;10 reps;Both;Strengthening Heel Raises: 10 reps    General Comments General comments (skin integrity, edema, etc.): on 1L O2 with SpO2 WNL throughout session, lowest reading 89%, HR WNL      Pertinent Vitals/Pain Pain Assessment: Faces Faces Pain Scale: Hurts a little bit Pain Location: rectal area Pain Descriptors / Indicators: Sore Pain Intervention(s): Monitored during session(cream applied)    Home Living                      Prior Function            PT Goals (current goals can now be found in the care plan section) Progress towards PT goals: Progressing toward goals    Frequency  Min 3X/week      PT Plan Current plan remains appropriate    Co-evaluation              AM-PAC PT "6 Clicks" Mobility   Outcome Measure  Help needed turning from your back to your side while in a flat bed without using bedrails?: A Little Help needed moving from lying on your back to sitting on the side of a flat bed without using bedrails?: A Little Help needed moving to and from a bed to a chair (including a wheelchair)?: A Little Help needed standing up from a chair using your arms (e.g., wheelchair or bedside chair)?: A Little Help needed to walk in hospital room?: A Lot Help needed climbing 3-5 steps with a railing? : Total 6  Click Score: 15    End of Session Equipment Utilized During Treatment: Oxygen Activity Tolerance: Patient limited by fatigue Patient left: in bed;with call bell/phone within reach;with bed alarm set   PT Visit Diagnosis: Other abnormalities of gait and mobility (R26.89);Muscle weakness (generalized) (M62.81)     Time: [id number] PT Time Calculation (min) (ACUTE ONLY): 30 min  Charges:  $Therapeutic Activity: 23-37 mins                     Magda Kiel, [place] [facility] [phone] 09/14/2018    Reginia Naas 09/14/2018, 1:14 PM

## 2018-09-15 LAB — COMPREHENSIVE METABOLIC PANEL
ALT: 36 U/L (ref 0–44)
AST: 26 U/L (ref 15–41)
Albumin: 1.9 g/dL — ABNORMAL LOW (ref 3.5–5.0)
Alkaline Phosphatase: 63 U/L (ref 38–126)
Anion gap: 10 (ref 5–15)
BUN: 39 mg/dL — ABNORMAL HIGH (ref 8–23)
CO2: 25 mmol/L (ref 22–32)
Calcium: 8 mg/dL — ABNORMAL LOW (ref 8.9–10.3)
Chloride: 95 mmol/L — ABNORMAL LOW (ref 98–111)
Creatinine, Ser: 1.3 mg/dL — ABNORMAL HIGH (ref 0.61–1.24)
GFR calc Af Amer: >60 mL/min (ref >60)
GFR calc non Af Amer: 56 mL/min — ABNORMAL LOW (ref >60)
Glucose, Bld: 194 mg/dL — ABNORMAL HIGH (ref 70–99)
Potassium: 3.4 mmol/L — ABNORMAL LOW (ref 3.5–5.1)
Sodium: 130 mmol/L — ABNORMAL LOW (ref 135–145)
Total Bilirubin: 2.3 mg/dL — ABNORMAL HIGH (ref 0.3–1.2)
Total Protein: 4.9 g/dL — ABNORMAL LOW (ref 6.5–8.1)

## 2018-09-15 LAB — GLUCOSE, CAPILLARY
Glucose-Capillary: 171 mg/dL — ABNORMAL HIGH (ref 70–99)
Glucose-Capillary: 211 mg/dL — ABNORMAL HIGH (ref 70–99)
Glucose-Capillary: 163 mg/dL — ABNORMAL HIGH (ref 70–99)
Glucose-Capillary: 125 mg/dL — ABNORMAL HIGH (ref 70–99)

## 2018-09-15 LAB — CBC
HCT: 26.9 % — ABNORMAL LOW (ref 39.0–52.0)
Hemoglobin: 9.2 g/dL — ABNORMAL LOW (ref 13.0–17.0)
MCH: 32.3 pg (ref 26.0–34.0)
MCHC: 34.2 g/dL (ref 30.0–36.0)
MCV: 94.4 fL (ref 80.0–100.0)
Platelets: 241 10*3/uL (ref 150–400)
RBC: 2.85 MIL/uL — ABNORMAL LOW (ref 4.22–5.81)
RDW: 14.3 % (ref 11.5–15.5)
WBC: 11.4 10*3/uL — ABNORMAL HIGH (ref 4.0–10.5)
nRBC: 0.3 % — ABNORMAL HIGH (ref 0.0–0.2)

## 2018-09-15 LAB — CULTURE, BLOOD (ROUTINE X 2)
Culture: NO GROWTH
Culture: NO GROWTH
Special Requests: ADEQUATE
Special Requests: ADEQUATE

## 2018-09-15 LAB — PROCALCITONIN: Procalcitonin: 2.19 ng/mL

## 2018-09-15 LAB — PHOSPHORUS: Phosphorus: 2.1 mg/dL — ABNORMAL LOW (ref 2.5–4.6)

## 2018-09-15 LAB — MAGNESIUM: Magnesium: 1.8 mg/dL (ref 1.7–2.4)

## 2018-09-15 MED ORDER — POTASSIUM PHOSPHATES 15 MMOLE/5ML IV SOLN
40.0000 meq | Freq: Once | INTRAVENOUS | Status: AC
  Administered 2018-09-15: 40 meq via INTRAVENOUS
  Filled 2018-09-15: qty 9.09

## 2018-09-15 MED ORDER — SIMETHICONE 80 MG PO CHEW
80.0000 mg | CHEWABLE_TABLET | Freq: Three times a day (TID) | ORAL | Status: AC
  Administered 2018-09-15 (×3): 80 mg via ORAL
  Filled 2018-09-15 (×3): qty 1

## 2018-09-15 MED ORDER — MAGNESIUM SULFATE 2 GM/50ML IV SOLN
2.0000 g | Freq: Once | INTRAVENOUS | Status: AC
  Administered 2018-09-15: 2 g via INTRAVENOUS
  Filled 2018-09-15: qty 50

## 2018-09-15 NOTE — Progress Notes (Addendum)
PROGRESS NOTE  Adrian Andrews [id number] DOB: 09-10-1950 DOA: 09/10/2018 PCP: [name], MD  HPI/Recap of past 41 hours: 68 year old male with history of CVA, carotid artery stenosis/stent, DM-2, CKD 3, HTN, HLD, COPD-3L and former smoker presenting with fever, productive cough, shortness of breath and diarrhea and admitted for sepsis due to RLL pneumonia, and AKI.  In ED, soft blood pressures.  Lactic acidosis.  CXR revealed RLL pneumonia.  COVID-19 negative. Started on ceftriaxone, azithromycin and a steroid.  PCCM consulted given soft blood pressure and uptrending lactic acidosis and recommended hydration.  Hospital course complicated by severe hypoxia requiring high flow nasal cannula.  CT chest done without contrast due to allergy showed no clear evidence of pulmonary embolism.  Revealed bilateral pulmonary infiltrates with increase in pulmonary vascularity.  Started on IV Lasix with improvement of hypoxia.  Elevated inflammatory markers with suspicion for COVID-19.  Repeated in-house COVID-19 negative.  2D echo done on 09/14/2018 unrevealing.  Showing normal LVEF 55 to 60%.   Assessment/Plan: Principal Problem:   Sepsis due to pneumonia [facility]) Active Problems:   AKI (acute kidney injury) ([place])   COPD (chronic obstructive pulmonary disease) ([place])   Type 2 diabetes mellitus with other specified complication (HCC)   CKD (chronic kidney disease) stage 3, GFR 30-59 ml/min (HCC)   RLL pneumonia (HCC)   Essential hypertension   Dyslipidemia   Resolving sepsis secondary to right lower lobe community-acquired pneumonia Presented with tachypnea and tachycardia with right lower lobe infiltrates on chest x-ray done on 09/10/2018. Procalcitonin improved but still elevated 2.1 on 09/15/18 from 5.2 on (09/12/2018) from 7.4 on 09/10/18 Continue cefepime and azithromycin, day #5/7 Blood cultures negative x3 days MRSA negative Respiratory panel negative COVID-19 negative x2 during this  admission  Acute on chronic hypoxic respiratory failure suspect multifactorial secondary to CAP Vs suspected acute on chronic diastolic CHF On 2 L of oxygen as needed at baseline Oxygen requirement is decreasing while on diuretics Continue to maintain O2 saturation greater than 92% Continue bronchodilators, Solu-Medrol 60 mg twice daily Continue IV Lasix 40 mg daily  Hypokalemia/hypophosphatemia/hypomagnesemia Repleted  Isolated hyperbilirubinemia Total bilirubin 2.3 on 09/15/18 Repeat CMP in the morning  Euvolemic hyponatremia Sodium 130 from 131 Continue to monitor Asymptomatic  Community-acquired pneumonia, POA Management as stated above Mucinex for cough as needed Continue flutter valve  Elevated d-dimer D-dimer 11 on 09/13/2018 Negative for PE Negative DVT in lower extremities bilaterally COVID 19-x2 during this admission  Significantly elevated BNP with concern for acute on chronic diastolic CHF BNP from 123456 on 02/13/2018 to greater than 2300 on 09/13/2018 2D echo done on 09/14/2018 showed normal LVEF 55 to 60%.  IV Lasix 40 mg daily Net I&O -8.1 L since admission Continue strict I's and O's and daily weight Closely monitor blood pressure, electrolytes and renal function while on diuretics  CKD 3 Appears to be at his baseline creatinine 1.3 with GFR 54 Avoid nephrotoxins Monitor urine output Repeat BMP in the morning  Anemia of chronic disease/macrocytic anemia Hemoglobin dropped this morning from 9.7-8.9 MCV of 100.4 No sign of overt bleeding Obtain iron studies, add on Repeat CBC in the morning  Mild thrombocytopenia Presented with platelet count of 111 Platelet count is improving up to 136K this morning  Prior polysubstance abuse with benzodiazepine and cocaine Last UDS on 05/04/2016+ for benzodiazepine and cocaine No repeated UDS during this admission  Chronic anxiety/depression Continue Zoloft, trazodone, Neurontin  COPD Continue  inhalers  Hypertension Blood pressure is currently soft  Continue to hold off atenolol Continue IV diuretics due to pulmonary edema impairing respiratory stability Maintain map greater than 65 and continue to monitor vital signs closely  Type 2 diabetes with hyperglycemia Hemoglobin A1c 7.6 on 09/10/2018 Continue insulin sliding scale Continue Levemir Avoid hypoglycemia    Code Status: DNR  Family Communication: None at bedside  Disposition Plan: Undetermined at this time.  Plan to DC once respiratory status is close to baseline.   Consultants:  PCCM  Procedures:  None  Antimicrobials:  Cefepime and azithromycin  DVT prophylaxis: Subcu Lovenox daily.   Objective: Vitals:   09/15/18 0737 09/15/18 0750 09/15/18 1153 09/15/18 1416  BP: 134/67  (!) 83/62   Pulse: 98  84   Resp: 16  20   Temp: 98.2 F (36.8 C)  98.6 F (37 C)   TempSrc: Oral  Oral   SpO2: 94% 96% (!) 89% 94%  Weight:      Height:        Intake/Output Summary (Last 24 hours) at 09/15/2018 1620 Last data filed at 09/15/2018 1500 Gross per 24 hour  Intake 2240 ml  Output 4050 ml  Net -1810 ml   Filed Weights   09/10/18 0858 09/10/18 1251 09/15/18 0317  Weight: 79.4 kg 81.1 kg 86.7 kg    Exam:   General: 68 y.o. year-old male Well-Developed Well-Nourished in No Acute Distress.  Alert and interactive.  Cardiovascular: Regular rate and rhythm no rubs or gallops.  No JVD or thyromegaly noted.    Respiratory: Very mild rales at bases.  No wheezes noted.  Poor inspiratory effort.    Abdomen: Soft nontender nondistended normal bowel sounds present.  Musculoskeletal: Trace lower extremity edema.  2 out of 4 pulses in all 4 extremities.    Psychiatry: Mood is appropriate for condition and setting..   Data Reviewed: CBC: Recent Labs  Lab 09/10/18 0912 09/11/18 0708 09/12/18 0646 09/13/18 0734 09/15/18 0401  WBC 9.3 4.9 8.1 9.5 11.4*  NEUTROABS 8.4* 4.5  --   --   --   HGB 11.2*  9.2* 9.7* 8.9* 9.2*  HCT 33.3* 28.3* 28.9* 27.3* 26.9*  MCV 97.7 99.0 100.3* 100.4* 94.4  PLT 111* 96* 124* 136* [id number]   Basic Metabolic Panel: Recent Labs  Lab 09/10/18 0912 09/11/18 0708 09/12/18 0646 09/13/18 0734 09/14/18 0647 09/15/18 0401  NA 130* 133* 134* 131*  --  130*  K 4.7 3.8 3.9 4.6  --  3.4*  CL 94* 104 102 103  --  95*  CO2 20* 18* 22 17*  --  25  GLUCOSE 276* 221* 196* 145*  --  194*  BUN 36* 34* 41* 40*  --  39*  CREATININE 1.83* 1.28* 1.33* 1.33*  --  1.30*  CALCIUM 8.9 8.3* 8.6* 8.1*  --  8.0*  MG  --   --  2.0 1.9 2.1 1.8  PHOS  --   --   --   --  3.0 2.1*   GFR: Estimated Creatinine Clearance: 58.5 mL/min (A) (by C-G formula based on SCr of 1.3 mg/dL (H)). Liver Function Tests: Recent Labs  Lab 09/10/18 0912 09/15/18 0401  AST 22 26  ALT 19 36  ALKPHOS 50 63  BILITOT 2.5* 2.3*  PROT 6.3* 4.9*  ALBUMIN 3.0* 1.9*   No results for input(s): LIPASE, AMYLASE in the last 168 hours. No results for input(s): AMMONIA in the last 168 hours. Coagulation Profile: Recent Labs  Lab 09/10/18 0912  INR 1.3*   Cardiac  Enzymes: No results for input(s): CKTOTAL, CKMB, CKMBINDEX, TROPONINI in the last 168 hours. BNP (last 3 results) No results for input(s): PROBNP in the last 8760 hours. HbA1C: No results for input(s): HGBA1C in the last 72 hours. CBG: Recent Labs  Lab 09/14/18 1236 09/14/18 1752 09/14/18 2043 09/15/18 0734 09/15/18 1146  GLUCAP 254* 170* 129* 171* 211*   Lipid Profile: No results for input(s): CHOL, HDL, LDLCALC, TRIG, CHOLHDL, LDLDIRECT in the last 72 hours. Thyroid Function Tests: No results for input(s): TSH, T4TOTAL, FREET4, T3FREE, THYROIDAB in the last 72 hours. Anemia Panel: Recent Labs    09/13/18 1522  FERRITIN 593*   Urine analysis:    Component Value Date/Time   COLORURINE YELLOW 02/13/2018 Lupus 02/13/2018 1735   LABSPEC 1.020 02/13/2018 1735   PHURINE 6.0 02/13/2018 1735   GLUCOSEU >=500  (A) 02/13/2018 1735   HGBUR MODERATE (A) 02/13/2018 1735   BILIRUBINUR NEGATIVE 02/13/2018 1735   BILIRUBINUR neg 09/10/2013 1308   KETONESUR 5 (A) 02/13/2018 1735   PROTEINUR 100 (A) 02/13/2018 1735   UROBILINOGEN 0.2 09/10/2013 1308   NITRITE NEGATIVE 02/13/2018 1735   LEUKOCYTESUR NEGATIVE 02/13/2018 1735   Sepsis Labs: @LABRCNTIP (procalcitonin:4,lacticidven:4)  ) Recent Results (from the past 240 hour(s))  SARS Coronavirus 2 [facility] order, Performed in [facility] hospital lab) Nasopharyngeal Nasopharyngeal Swab     Status: None   Collection Time: 09/10/18  9:12 AM   Specimen: Nasopharyngeal Swab  Result Value Ref Range Status   SARS Coronavirus 2 NEGATIVE NEGATIVE Final    Comment: (NOTE) If result is NEGATIVE SARS-CoV-2 target nucleic acids are NOT DETECTED. The SARS-CoV-2 RNA is generally detectable in upper and lower  respiratory specimens during the acute phase of infection. The lowest  concentration of SARS-CoV-2 viral copies this assay can detect is 250  copies / mL. A negative result does not preclude SARS-CoV-2 infection  and should not be used as the sole basis for treatment or other  patient management decisions.  A negative result may occur with  improper specimen collection / handling, submission of specimen other  than nasopharyngeal swab, presence of viral mutation(s) within the  areas targeted by this assay, and inadequate number of viral copies  (<250 copies / mL). A negative result must be combined with clinical  observations, patient history, and epidemiological information. If result is POSITIVE SARS-CoV-2 target nucleic acids are DETECTED. The SARS-CoV-2 RNA is generally detectable in upper and lower  respiratory specimens dur ing the acute phase of infection.  Positive  results are indicative of active infection with SARS-CoV-2.  Clinical  correlation with patient history and other diagnostic information is  necessary to determine patient infection  status.  Positive results do  not rule out bacterial infection or co-infection with other viruses. If result is PRESUMPTIVE POSTIVE SARS-CoV-2 nucleic acids MAY BE PRESENT.   A presumptive positive result was obtained on the submitted specimen  and confirmed on repeat testing.  While 2019 novel coronavirus  (SARS-CoV-2) nucleic acids may be present in the submitted sample  additional confirmatory testing may be necessary for epidemiological  and / or clinical management purposes  to differentiate between  SARS-CoV-2 and other Sarbecovirus currently known to infect humans.  If clinically indicated additional testing with an alternate test  methodology [phone]) is advised. The SARS-CoV-2 RNA is generally  detectable in upper and lower respiratory sp ecimens during the acute  phase of infection. The expected result is Negative. Fact Sheet for Patients:  StrictlyIdeas.no  Fact Sheet for Healthcare Providers: BankingDealers.co.za This test is not yet approved or cleared by the [place] FDA and has been authorized for detection and/or diagnosis of SARS-CoV-2 by FDA under an Emergency Use Authorization (EUA).  This EUA will remain in effect (meaning this test can be used) for the duration of the COVID-19 declaration under Section 564(b)(1) of the Act, 21 U.S.C. section 360bbb-3(b)(1), unless the authorization is terminated or revoked sooner. Performed at [facility], [place] [address], [place], [place] [address]   Blood Culture (routine x 2)     Status: None   Collection Time: 09/10/18 10:04 AM   Specimen: BLOOD  Result Value Ref Range Status   Specimen Description BLOOD SITE NOT SPECIFIED  Final   Special Requests   Final    BOTTLES DRAWN AEROBIC AND ANAEROBIC Blood Culture adequate volume   Culture   Final    NO GROWTH 5 DAYS Performed at [facility], [address] [place], [place] [address]    Report Status 09/15/2018  FINAL  Final  Blood Culture (routine x 2)     Status: None   Collection Time: 09/10/18 10:10 AM   Specimen: BLOOD LEFT ARM  Result Value Ref Range Status   Specimen Description BLOOD LEFT ARM  Final   Special Requests   Final    BOTTLES DRAWN AEROBIC AND ANAEROBIC Blood Culture adequate volume   Culture   Final    NO GROWTH 5 DAYS Performed at [facility], [place] [address], [place], [place] [address]    Report Status 09/15/2018 FINAL  Final  Respiratory Panel by PCR     Status: None   Collection Time: 09/10/18  1:20 PM   Specimen: Nasopharyngeal Swab; Respiratory  Result Value Ref Range Status   Adenovirus NOT DETECTED NOT DETECTED Final   Coronavirus 229E NOT DETECTED NOT DETECTED Final    Comment: (NOTE) The Coronavirus on the Respiratory Panel, DOES NOT test for the novel  Coronavirus (2019 nCoV)    Coronavirus HKU1 NOT DETECTED NOT DETECTED Final   Coronavirus NL63 NOT DETECTED NOT DETECTED Final   Coronavirus OC43 NOT DETECTED NOT DETECTED Final   Metapneumovirus NOT DETECTED NOT DETECTED Final   Rhinovirus / Enterovirus NOT DETECTED NOT DETECTED Final   Influenza A NOT DETECTED NOT DETECTED Final   Influenza B NOT DETECTED NOT DETECTED Final   Parainfluenza Virus 1 NOT DETECTED NOT DETECTED Final   Parainfluenza Virus 2 NOT DETECTED NOT DETECTED Final   Parainfluenza Virus 3 NOT DETECTED NOT DETECTED Final   Parainfluenza Virus 4 NOT DETECTED NOT DETECTED Final   Respiratory Syncytial Virus NOT DETECTED NOT DETECTED Final   Bordetella pertussis NOT DETECTED NOT DETECTED Final   Chlamydophila pneumoniae NOT DETECTED NOT DETECTED Final   Mycoplasma pneumoniae NOT DETECTED NOT DETECTED Final    Comment: Performed at [facility], [place] [address], [place],  [address]  SARS Coronavirus 2 [facility] order, Performed in [facility] hospital lab)     Status: None   Collection Time: 09/13/18  3:43 PM  Result Value Ref Range Status   SARS Coronavirus 2 NEGATIVE  NEGATIVE Final    Comment: (NOTE) If result is NEGATIVE SARS-CoV-2 target nucleic acids are NOT DETECTED. The SARS-CoV-2 RNA is generally detectable in upper and lower  respiratory specimens during the acute phase of infection. The lowest  concentration of SARS-CoV-2 viral copies this assay can detect is 250  copies / mL. A negative  result does not preclude SARS-CoV-2 infection  and should not be used as the sole basis for treatment or other  patient management decisions.  A negative result may occur with  improper specimen collection / handling, submission of specimen other  than nasopharyngeal swab, presence of viral mutation(s) within the  areas targeted by this assay, and inadequate number of viral copies  (<250 copies / mL). A negative result must be combined with clinical  observations, patient history, and epidemiological information. If result is POSITIVE SARS-CoV-2 target nucleic acids are DETECTED. The SARS-CoV-2 RNA is generally detectable in upper and lower  respiratory specimens dur ing the acute phase of infection.  Positive  results are indicative of active infection with SARS-CoV-2.  Clinical  correlation with patient history and other diagnostic information is  necessary to determine patient infection status.  Positive results do  not rule out bacterial infection or co-infection with other viruses. If result is PRESUMPTIVE POSTIVE SARS-CoV-2 nucleic acids MAY BE PRESENT.   A presumptive positive result was obtained on the submitted specimen  and confirmed on repeat testing.  While 2019 novel coronavirus  (SARS-CoV-2) nucleic acids may be present in the submitted sample  additional confirmatory testing may be necessary for epidemiological  and / or clinical management purposes  to differentiate between  SARS-CoV-2 and other Sarbecovirus currently known to infect humans.  If clinically indicated additional testing with an alternate test  methodology [phone]) is  advised. The SARS-CoV-2 RNA is generally  detectable in upper and lower respiratory sp ecimens during the acute  phase of infection. The expected result is Negative. Fact Sheet for Patients:  StrictlyIdeas.no Fact Sheet for Healthcare Providers: BankingDealers.co.za This test is not yet approved or cleared by the [place] FDA and has been authorized for detection and/or diagnosis of SARS-CoV-2 by FDA under an Emergency Use Authorization (EUA).  This EUA will remain in effect (meaning this test can be used) for the duration of the COVID-19 declaration under Section 564(b)(1) of the Act, 21 U.S.C. section 360bbb-3(b)(1), unless the authorization is terminated or revoked sooner. Performed at [facility], [place] [address] [place], [place] [address]   MRSA PCR Screening     Status: None   Collection Time: 09/13/18  6:11 PM   Specimen: Nasal Mucosa; Nasopharyngeal  Result Value Ref Range Status   MRSA by PCR NEGATIVE NEGATIVE Final    Comment:        The GeneXpert MRSA Assay (FDA approved for NASAL specimens only), is one component of a comprehensive MRSA colonization surveillance program. It is not intended to diagnose MRSA infection nor to guide or monitor treatment for MRSA infections. Performed at [facility], [place] [address] [place], [place] [address]   Respiratory Panel by PCR     Status: None   Collection Time: 09/13/18  6:12 PM   Specimen: Nasopharyngeal Swab; Respiratory  Result Value Ref Range Status   Adenovirus NOT DETECTED NOT DETECTED Final   Coronavirus 229E NOT DETECTED NOT DETECTED Final    Comment: (NOTE) The Coronavirus on the Respiratory Panel, DOES NOT test for the novel  Coronavirus (2019 nCoV)    Coronavirus HKU1 NOT DETECTED NOT DETECTED Final   Coronavirus NL63 NOT DETECTED NOT DETECTED Final   Coronavirus OC43 NOT DETECTED NOT DETECTED Final   Metapneumovirus NOT DETECTED NOT DETECTED  Final   Rhinovirus / Enterovirus NOT DETECTED NOT DETECTED Final   Influenza A NOT DETECTED NOT DETECTED Final   Influenza B NOT  DETECTED NOT DETECTED Final   Parainfluenza Virus 1 NOT DETECTED NOT DETECTED Final   Parainfluenza Virus 2 NOT DETECTED NOT DETECTED Final   Parainfluenza Virus 3 NOT DETECTED NOT DETECTED Final   Parainfluenza Virus 4 NOT DETECTED NOT DETECTED Final   Respiratory Syncytial Virus NOT DETECTED NOT DETECTED Final   Bordetella pertussis NOT DETECTED NOT DETECTED Final   Chlamydophila pneumoniae NOT DETECTED NOT DETECTED Final   Mycoplasma pneumoniae NOT DETECTED NOT DETECTED Final    Comment: Performed at [facility], [place] [address], [place], [place] [address]      Studies: No results found.  Scheduled Meds:  aspirin EC  81 mg Oral Daily   atenolol  12.5 mg Oral Daily   atorvastatin  20 mg Oral QHS   enoxaparin (LOVENOX) injection  40 mg Subcutaneous Daily   fluticasone  2 spray Each Nare Daily   furosemide  40 mg Intravenous Daily   gabapentin  300 mg Oral q morning - 10a   gabapentin  600 mg Oral QHS   guaiFENesin  600 mg Oral BID   insulin aspart  0-15 Units Subcutaneous TID WC   insulin aspart  0-5 Units Subcutaneous QHS   insulin detemir  8 Units Subcutaneous BID   ipratropium  0.5 mg Inhalation Q6H   levalbuterol  0.63 mg Inhalation Q6H   pantoprazole  40 mg Oral BID   senna-docusate  1 tablet Oral QHS   sertraline  50 mg Oral Daily   simethicone  80 mg Oral TID   traZODone  50 mg Oral QHS    Continuous Infusions:  azithromycin 500 mg (09/15/18 0819)   ceFEPime (MAXIPIME) IV 2 g (09/15/18 1259)     LOS: 5 days     Kayleen Memos, MD [facility] Pager [phone]  If 7PM-7AM, please contact night-coverage [email or web address] Password [email or web address] 09/15/2018, 4:20 PM

## 2018-09-15 NOTE — Progress Notes (Signed)
  Speech Language Pathology Treatment: Dysphagia  Patient Details Name: ILIJA CHEZEM MRN: [id number] DOB: 19-Feb-1950 Today's Date: 09/15/2018 Time: [id number] SLP Time Calculation (min) (ACUTE ONLY): 16 min  Assessment / Plan / Recommendation Clinical Impression  Pt had an immediate cough after his initial sip of water, at which time his [facility] was elevated partially. SLP brought the [facility] up further with no overt coughing noted, but he continues to have throat clearing associated with intake. He completed the three ounce water screen again with a throat clear but no coughing elicited, and he denies any coughing during his meals. Question if this could also be related to an esophageal component. Recommend continuing current diet, although SLP f/u is still warranted for additional monitoring and consideration of MBS if additional symptoms of dysphagia are present.    HPI HPI: KCI BAES is a 68 y.o. male admitted 09/10/2018 with fever, SOB, and productive cough. PMH: CVA, carotid stenosis s/p B stents; HTN; HLD; DM; COPD on 3L home O2; stage 3 CKD, chronic back pain; No prior SLP intervention found. CXR = RLL PNA      SLP Plan  Continue with current plan of care       Recommendations  Diet recommendations: Regular;Thin liquid Liquids provided via: Cup;Straw Medication Administration: Whole meds with liquid Supervision: Patient able to self feed;Intermittent supervision to cue for compensatory strategies Compensations: Slow rate;Small sips/bites;Other (Comment)(take breaks PRN) Postural Changes and/or Swallow Maneuvers: Seated upright 90 degrees;Upright 30-60 min after meal                Oral Care Recommendations: Oral care BID Follow up Recommendations: None SLP Visit Diagnosis: Dysphagia, unspecified (R13.10) Plan: Continue with current plan of care       GO                Venita Sheffield Fredrich Cory 09/15/2018, 3:12 PM  Pollyann Glen, M.A. [place] Acute Environmental education officer  [phone] Office [phone]

## 2018-09-16 DIAGNOSIS — I5033 Acute on chronic diastolic (congestive) heart failure: Secondary | ICD-10-CM

## 2018-09-16 LAB — COMPREHENSIVE METABOLIC PANEL
ALT: 26 U/L (ref 0–44)
AST: 17 U/L (ref 15–41)
Albumin: 1.6 g/dL — ABNORMAL LOW (ref 3.5–5.0)
Alkaline Phosphatase: 63 U/L (ref 38–126)
Anion gap: 9 (ref 5–15)
BUN: 33 mg/dL — ABNORMAL HIGH (ref 8–23)
CO2: 27 mmol/L (ref 22–32)
Calcium: 7.8 mg/dL — ABNORMAL LOW (ref 8.9–10.3)
Chloride: 94 mmol/L — ABNORMAL LOW (ref 98–111)
Creatinine, Ser: 1.32 mg/dL — ABNORMAL HIGH (ref 0.61–1.24)
GFR calc Af Amer: >60 mL/min (ref >60)
GFR calc non Af Amer: 55 mL/min — ABNORMAL LOW (ref >60)
Glucose, Bld: 161 mg/dL — ABNORMAL HIGH (ref 70–99)
Potassium: 3.5 mmol/L (ref 3.5–5.1)
Sodium: 130 mmol/L — ABNORMAL LOW (ref 135–145)
Total Bilirubin: 3.1 mg/dL — ABNORMAL HIGH (ref 0.3–1.2)
Total Protein: 4.7 g/dL — ABNORMAL LOW (ref 6.5–8.1)

## 2018-09-16 LAB — URINALYSIS, ROUTINE W REFLEX MICROSCOPIC
Bacteria, UA: NONE SEEN
Bilirubin Urine: NEGATIVE
Glucose, UA: NEGATIVE mg/dL
Ketones, ur: NEGATIVE mg/dL
Leukocytes,Ua: NEGATIVE
Nitrite: NEGATIVE
Protein, ur: NEGATIVE mg/dL
Specific Gravity, Urine: 1.006 (ref 1.005–1.030)
pH: 6 (ref 5.0–8.0)

## 2018-09-16 LAB — GLUCOSE, CAPILLARY
Glucose-Capillary: 145 mg/dL — ABNORMAL HIGH (ref 70–99)
Glucose-Capillary: 137 mg/dL — ABNORMAL HIGH (ref 70–99)
Glucose-Capillary: 231 mg/dL — ABNORMAL HIGH (ref 70–99)
Glucose-Capillary: 188 mg/dL — ABNORMAL HIGH (ref 70–99)

## 2018-09-16 LAB — STREP PNEUMONIAE URINARY ANTIGEN: Strep Pneumo Urinary Antigen: NEGATIVE

## 2018-09-16 LAB — BILIRUBIN, DIRECT: Bilirubin, Direct: 0.9 mg/dL — ABNORMAL HIGH (ref 0.0–0.2)

## 2018-09-16 MED ORDER — LIP MEDEX EX OINT
TOPICAL_OINTMENT | CUTANEOUS | Status: DC | PRN
  Administered 2018-09-16: 1 via TOPICAL
  Filled 2018-09-16 (×2): qty 7

## 2018-09-16 NOTE — Plan of Care (Signed)

## 2018-09-16 NOTE — Progress Notes (Signed)
Occupational Therapy Treatment Patient Details Name: Adrian Andrews MRN: [id number] DOB: 1950/05/16 Today's Date: 09/16/2018    History of present illness Adrian Andrews is a 68 y.o. male with medical history significant of CVA; carotid stenosis s/p B stents; HTN; HLD; DM; COPD on 2L home O2; and stage 3 CKD presenting with fever, cough, and diarrhea. Found to have Sepsis due to pneumonia   OT comments  Pt progressing with OT, but pt fatigues so quickly with any exertion. Pt on 4L O2 requiring 7L O2 to increase >90%. Pt sitting EOB for light grooming and stood x2 times taking a few steps toward HOB. Pt performing pursed lip breathing technique. Pt unable to properly care for self- has condom cath on and unable to stand >1 min at a time without requiring seated rest breaks. Pt minA overall for bed mobility and transfers. Pt unsteady with initial standing balance. Pt unable to properly care for self in a timely manner and without taking long seated breaks with increase pursed lip breathing. Pt requires continued OT. OT following acutely.  Pt appeared to agree with OTR about going to SNF.    Follow Up Recommendations  SNF;Supervision/Assistance - 24 hour    Equipment Recommendations  None recommended by OT    Recommendations for Other Services      Precautions / Restrictions Precautions Precautions: Fall;Other (comment) Precaution Comments: watch O2 Restrictions Weight Bearing Restrictions: No       Mobility Bed Mobility Overal bed mobility: Needs Assistance Bed Mobility: Sit to Supine     Supine to sit: Min assist Sit to supine: Min guard   General bed mobility comments: trunk elevation with HHA  Transfers Overall transfer level: Needs assistance Equipment used: Rolling walker (2 wheeled);None Transfers: Sit to/from [employer] to Stand: Min assist         General transfer comment: minA for power up, but mostly for initial standing balance     Balance Overall balance assessment: Needs assistance Sitting-balance support: No upper extremity supported;Feet supported Sitting balance-Leahy Scale: Good       Standing balance-Leahy Scale: Poor Standing balance comment: min a or bilat UE support for balance               High Level Balance Comments: Pt side stepping toward [facility]           ADL either performed or assessed with clinical judgement   ADL Overall ADL's : Needs assistance/impaired     Grooming: Sitting;Wash/dry face;Supervision/safety                   Toilet Transfer: Minimal assistance;BSC;Cueing for safety;Cueing for sequencing           Functional mobility during ADLs: Minimal assistance;Rolling walker;Cueing for safety;Cueing for sequencing General ADL Comments: Pt fatigues so quickly with any exertion. Pt on 4L O2 requiring 7L O2 to increase >90%. Pt sitting EOB for light grooming and stood x2 times taking a few steps toward HOB. Pt performing pursed lip breathing technique. Pt unable to properly care for self- has condom cath on and unable to stand >1 min at a time without requiring seated rest breaks.     Vision   Vision Assessment?: No apparent visual deficits   Perception     Praxis      Cognition Arousal/Alertness: Awake/alert Behavior During Therapy: WFL for tasks assessed/performed Overall Cognitive Status: Within Functional Limits for tasks assessed  Exercises     Shoulder Instructions       General Comments requiring 7L O2 to increase  >90%; now requires 4.5 L O2 to stay above 90% after exertion.    Pertinent Vitals/ Pain       Pain Assessment: Faces Faces Pain Scale: No hurt  Home Living                                          Prior Functioning/Environment              Frequency  Min 2X/week        Progress Toward Goals  OT Goals(current goals can now be found in the care  plan section)  Progress towards OT goals: Progressing toward goals  Acute Rehab OT Goals Patient Stated Goal: thinking about rehab OT Goal Formulation: With patient Time For Goal Achievement: 09/25/18 Potential to Achieve Goals: Good ADL Goals Pt Will Perform Grooming: with modified independence;sitting;standing Pt Will Perform Upper Body Bathing: with modified independence;sitting;standing Pt Will Perform Lower Body Bathing: with modified independence;sit to/from stand Pt Will Perform Upper Body Dressing: with modified independence;standing;sitting Pt Will Perform Lower Body Dressing: with modified independence;sit to/from stand Pt Will Transfer to Toilet: with modified independence;ambulating;bedside commode Pt Will Perform Toileting - Clothing Manipulation and hygiene: with modified independence;sit to/from stand Additional ADL Goal #1: Pt will be aware of energy conservation strategies from handout that may be of benefit to him  Plan Discharge plan remains appropriate    Co-evaluation                 AM-PAC OT "6 Clicks" Daily Activity     Outcome Measure   Help from another person eating meals?: None Help from another person taking care of personal grooming?: A Little Help from another person toileting, which includes using toliet, bedpan, or urinal?: A Lot Help from another person bathing (including washing, rinsing, drying)?: A Lot Help from another person to put on and taking off regular upper body clothing?: A Little Help from another person to put on and taking off regular lower body clothing?: A Lot 6 Click Score: 16    End of Session Equipment Utilized During Treatment: Gait belt;Rolling walker  OT Visit Diagnosis: Unsteadiness on feet (R26.81);Other abnormalities of gait and mobility (R26.89);Muscle weakness (generalized) (M62.81)   Activity Tolerance Patient tolerated treatment well   Patient Left in chair;with call bell/phone within reach;with chair alarm  set   Nurse Communication Mobility status        Time: 1050-1110 OT Time Calculation (min): 20 min  Charges: OT General Charges $OT Visit: 1 Visit OT Treatments $Self Care/Home Management : 8-22 mins  Darryl Nestle) Marsa Aris OTR/L Acute Rehabilitation Services Pager: [phone] Office: [phone]    Audie Pinto 09/16/2018, 3:46 PM

## 2018-09-16 NOTE — Care Management Important Message (Signed)
Important Message  Patient Details  Name: Adrian Andrews MRN: [id number] Date of Birth: 02-03-1950   Medicare Important Message Given:  Yes     Orbie Pyo 09/16/2018, 1:44 PM

## 2018-09-16 NOTE — Progress Notes (Signed)
  Speech Language Pathology Treatment: Dysphagia  Patient Details Name: ZAIDIN SKIVER MRN: [id number] DOB: 10-31-1950 Today's Date: 09/16/2018 Time: [id number] SLP Time Calculation (min) (ACUTE ONLY): 14 min  Assessment / Plan / Recommendation Clinical Impression  Pt had a cough with his first sip of water as the straw started to slip out of his mouth during drinking. No further coughing was noted, but he did have occasional throat clearing regardless of consistency, including thin liquids as well as what looked to be at least nectar thick liquid (thick, creamy soup with pieces of soft solids). His respiratory status appears to be improving with less SOB and WOB today. He denies any subjective difficulties. Will continue to follow along. Education was reinforced today about the risks of aspiration and strategies to help reduce this risk.   HPI HPI: IMER ANTONETTI is a 68 y.o. male admitted 09/10/2018 with fever, SOB, and productive cough. PMH: CVA, carotid stenosis s/p B stents; HTN; HLD; DM; COPD on 3L home O2; stage 3 CKD, chronic back pain; No prior SLP intervention found. CXR = RLL PNA      SLP Plan  Continue with current plan of care       Recommendations  Diet recommendations: Regular;Thin liquid Liquids provided via: Cup;Straw Medication Administration: Whole meds with liquid Supervision: Patient able to self feed;Intermittent supervision to cue for compensatory strategies Compensations: Slow rate;Small sips/bites;Other (Comment)(take breaks PRN) Postural Changes and/or Swallow Maneuvers: Seated upright 90 degrees;Upright 30-60 min after meal                Oral Care Recommendations: Oral care BID Follow up Recommendations: None SLP Visit Diagnosis: Dysphagia, unspecified (R13.10) Plan: Continue with current plan of care       GO                Venita Sheffield Jachob Mcclean 09/16/2018, 2:57 PM  Pollyann Glen, M.A. [place] Acute Environmental education officer [phone] Office  [phone]

## 2018-09-16 NOTE — Progress Notes (Signed)
PROGRESS NOTE  Adrian Andrews [id number] DOB: August 12, 1950 DOA: 09/10/2018 PCP: [name], MD  Brief History   68 year old man presented with sepsis secondary to right lower lobe pneumonia and AKI.  Hospital course complicated by severe hypoxia requiring high flow nasal cannula.*  A & P  Sepsis secondary to right lower lobe pneumonia --COVID negative x2 --Afebrile, hemodynamics stable --Sepsis symptomatology appears resolved --Continues to require high flow nasal cannula at 5 L, above baseline and remains short of breath.  Chest x-ray 8/30 showed significant right lower lobe infiltrate. --Continue treatment with azithromycin and cefepime  Acute on chronic hypoxic respiratory failure secondary to pneumonia, possible acute on chronic diastolic CHF, COPD. Baseline O2 requirement is 2 L as needed --Still on high flow nasal cannula 5 L.  Wean as tolerated.  Acute on chronic diastolic CHF --Urine output 3300.  -2.5 L overnight.  -9.9 L since admission. --Appears to be stabilizing.  No significant lower extremity edema noted.  Not on outpatient diuretic.  Monitor fluid balance.  Diabetes mellitus type 2 --CBG stable.  Continue Levemir, sliding scale insulin.  CKD stage III --Appears to be at baseline.  Anemia of chronic disease --Hemoglobin stable.  Isolated hyperbilirubinemia --Etiology unclear.  Was modestly elevated February 13, 2018. --Fractionated bilirubin in a.m.  Chronic hyponatremia, dating back to December 2018, seen also February 2019, December 2019, January and February 2020 --No further inpatient evaluation suggested.  Marland Kitchen   Resolved Hospital Problem list       DVT prophylaxis: DNR Code Status: Full Family Communication: none Disposition Plan: home    Murray Hodgkins, MD  [facility] Direct contact: see [email or web address] (further directions at bottom of note if needed) 7PM-7AM contact night coverage as at bottom of note 09/16/2018, 3:17 PM  LOS: 6 days    Significant Hospital Events   .    Consults:  .    Procedures:  .   Significant Diagnostic Tests:  Marland Kitchen    Micro Data:  .    Antimicrobials:  .   Interval History/Subjective  Still short of breath but breathing somewhat better.  Objective   Vitals:  Vitals:   09/16/18 1130 09/16/18 1405  BP: 117/63   Pulse: 100 92  Resp: 15 (!) 24  Temp: 100 F (37.8 C)   SpO2: 91% 95%    Exam:  Constitutional:  . Appears calm and comfortable Respiratory:  . CTA bilaterally, no w/r/r.  Decreased breath sounds bilaterally. . Mild increased respiratory effort. Cardiovascular:  . RRR, no m/r/g . Telemetry sinus rhythm . No LE extremity edema   Psychiatric:  . Mental status o Mood, affect appropriate  I have personally reviewed the following:   Today's Data  . Sodium 130, stable.  Potassium 3.5.  BUN trending down, 33.  Creatinine stable at 1.32. Marland Kitchen Total bilirubin 3.1, remainder hepatic function panel unremarkable.  Scheduled Meds: . aspirin EC  81 mg Oral Daily  . atenolol  12.5 mg Oral Daily  . atorvastatin  20 mg Oral QHS  . enoxaparin (LOVENOX) injection  40 mg Subcutaneous Daily  . fluticasone  2 spray Each Nare Daily  . gabapentin  300 mg Oral q morning - 10a  . gabapentin  600 mg Oral QHS  . guaiFENesin  600 mg Oral BID  . insulin aspart  0-15 Units Subcutaneous TID WC  . insulin aspart  0-5 Units Subcutaneous QHS  . insulin detemir  8 Units Subcutaneous BID  . ipratropium  0.5 mg Inhalation Q6H  .  levalbuterol  0.63 mg Inhalation Q6H  . pantoprazole  40 mg Oral BID  . senna-docusate  1 tablet Oral QHS  . sertraline  50 mg Oral Daily  . traZODone  50 mg Oral QHS   Continuous Infusions: . azithromycin 500 mg (09/16/18 1158)  . ceFEPime (MAXIPIME) IV 2 g (09/16/18 1456)    Principal Problem:   Sepsis due to pneumonia [facility]) Active Problems:   Acute on chronic respiratory failure with hypoxia (HCC)   COPD (chronic obstructive pulmonary disease) (HCC)    Type 2 diabetes mellitus with other specified complication (HCC)   CKD (chronic kidney disease) stage 3, GFR 30-59 ml/min (HCC)   RLL pneumonia (HCC)   Essential hypertension   Acute on chronic diastolic CHF (congestive heart failure) ([place])   Hyperbilirubinemia   LOS: 6 days   How to contact the [facility] Attending or Consulting provider [place] or covering provider during after hours [place], for this patient?  1. Check the care team in [facility] and look for a) attending/consulting TRH provider listed and b) the [facility] team listed 2. Log into [email or web address] and use [place]'s universal password to access. If you do not have the password, please contact the hospital operator. 3. Locate the [facility] provider you are looking for under [facility] and page to a number that you can be directly reached. 4. If you still have difficulty reaching the provider, please page the [facility] (Director on Call) for the Hospitalists listed on amion for assistance.

## 2018-09-17 LAB — HEPATIC FUNCTION PANEL
ALT: 23 U/L (ref 0–44)
AST: 14 U/L — ABNORMAL LOW (ref 15–41)
Albumin: 1.9 g/dL — ABNORMAL LOW (ref 3.5–5.0)
Alkaline Phosphatase: 66 U/L (ref 38–126)
Bilirubin, Direct: 0.8 mg/dL — ABNORMAL HIGH (ref 0.0–0.2)
Indirect Bilirubin: 2.2 mg/dL — ABNORMAL HIGH (ref 0.3–0.9)
Total Bilirubin: 3 mg/dL — ABNORMAL HIGH (ref 0.3–1.2)
Total Protein: 4.8 g/dL — ABNORMAL LOW (ref 6.5–8.1)

## 2018-09-17 LAB — URINE CULTURE: Culture: NO GROWTH

## 2018-09-17 LAB — GLUCOSE, CAPILLARY
Glucose-Capillary: 152 mg/dL — ABNORMAL HIGH (ref 70–99)
Glucose-Capillary: 180 mg/dL — ABNORMAL HIGH (ref 70–99)
Glucose-Capillary: 182 mg/dL — ABNORMAL HIGH (ref 70–99)
Glucose-Capillary: 155 mg/dL — ABNORMAL HIGH (ref 70–99)
Glucose-Capillary: 146 mg/dL — ABNORMAL HIGH (ref 70–99)

## 2018-09-17 NOTE — TOC Progression Note (Signed)
Transition of Care (TOC) - Progression Note    Patient Details  Name: Adrian Andrews MRN: [id number] Date of Birth: 03/06/1950  Transition of Care (TOC) CM/SW Contact  Elizabeth M Paisley, LCSW Phone Number: 09/17/2018, 4:22 PM  Clinical Narrative:  CSW met with patient to discuss discharge plan. Patient says he would really like to be able to go home, but acknowledges that it's probably not a good idea. He is home alone and his ex-wife is his only support; she lives across the road but just had her fourth surgery and is in no shape to assist him. He knows that he is weak and struggling, and he said that he wishes that he had just moved into a home before all of this happened. Patient gave permission for CSW to fax out referral and follow up with more information on facility choices tomorrow.    Expected Discharge Plan: Skilled Nursing Facility Barriers to Discharge: Continued Medical Work up  Expected Discharge Plan and Services Expected Discharge Plan: Skilled Nursing Facility       Living arrangements for the past 2 months: Single Family Home                           HH Arranged: RN   Date HH Agency Contacted: 09/11/18 Time HH Agency Contacted: 1041 Representative spoke with at HH Agency: Cory   Social Determinants of Health (SDOH) Interventions    Readmission Risk Interventions Readmission Risk Prevention Plan 09/11/2018  Transportation Screening Complete  HRI or Home Care Consult Complete  Some recent data might be hidden   

## 2018-09-17 NOTE — Progress Notes (Signed)
PROGRESS NOTE  Adrian Andrews [id number] DOB: 09-Jul-1950 DOA: 09/10/2018 PCP: [name], MD  HPI/Recap of past 57 hours: 68 year old male with history of CVA, carotid artery stenosis/stent, DM-2, CKD 3, HTN, HLD, COPD-3L and former smoker presenting with fever, productive cough, shortness of breath and diarrhea and admitted for sepsis due to RLL pneumonia, and AKI.  In ED, soft blood pressures.  Lactic acidosis.  CXR revealed RLL pneumonia.  COVID-19 negative. Started on ceftriaxone, azithromycin and a steroid.  PCCM consulted given soft blood pressure and uptrending lactic acidosis and recommended hydration.  Hospital course complicated by severe hypoxia requiring high flow nasal cannula.  CT chest done without contrast due to allergy showed no clear evidence of pulmonary embolism.  Revealed bilateral pulmonary infiltrates with increase in pulmonary vascularity.  Started on IV Lasix with improvement of hypoxia.  Elevated inflammatory markers with suspicion for COVID-19.  Repeated in-house COVID-19 negative.  2D echo done on 09/14/2018 unrevealing.  Showing normal LVEF 55 to 60%.   09/17/18: Patient was seen and examined at his bedside.  Somnolent but easily arousable to voices.  Currently on 4 L of O2 [place]  Assessment/Plan: Principal Problem:   Sepsis due to pneumonia [facility]) Active Problems:   Acute on chronic respiratory failure with hypoxia (HCC)   COPD (chronic obstructive pulmonary disease) (HCC)   Type 2 diabetes mellitus with other specified complication (HCC)   CKD (chronic kidney disease) stage 3, GFR 30-59 ml/min (HCC)   RLL pneumonia (HCC)   Essential hypertension   Acute on chronic diastolic CHF (congestive heart failure) ([place])   Hyperbilirubinemia   Sepsis secondary to right lower lobe community-acquired pneumonia Presented with tachypnea and tachycardia with right lower lobe infiltrates on chest x-ray done on 09/10/2018. Procalcitonin improved but still elevated  2.1 on 09/15/18 from 5.2 on (09/12/2018) from 7.4 on 09/10/18 Continue cefepime and azithromycin, day #6/7 Blood cultures negative x3 days MRSA negative Respiratory panel negative COVID-19 negative x2 during this admission   Persistent fevers, unclear etiology Obtain a procalcitonin level in the morning Currently on cefepime and azithromycin Continue to monitor fever curve and WBC Obtain CBC with differential in the morning  Persistent acute on chronic hypoxic respiratory failure suspect multifactorial secondary to CAP Vs suspected acute on chronic diastolic CHF On 2 L of oxygen as needed at baseline Oxygen requirement is decreasing while on diuretics Continue to maintain O2 saturation greater than 92% Continue bronchodilators Off Lasix and IV steroids  Hypokalemia/hypophosphatemia/hypomagnesemia Repleted  Persistent isolated hyperbilirubinemia Total bilirubin 2.3 on 09/15/18>>3.0 Repeat CMP in the morning  Euvolemic hyponatremia Sodium 130 from 131 Continue to monitor Asymptomatic  Community-acquired pneumonia, POA Management as stated above Mucinex for cough as needed Continue flutter valve  Elevated d-dimer D-dimer 11 on 09/13/2018 Negative for PE Negative DVT in lower extremities bilaterally COVID 19-x2 during this admission  Significantly elevated BNP with concern for acute on chronic diastolic CHF BNP from [id number] on 02/13/2018 to greater than 2300 on 09/13/2018 2D echo done on 09/14/2018 showed normal LVEF 55 to 60%.  IV Lasix 40 mg daily Net I&O -8.1 L since admission Continue strict I's and O's and daily weight Closely monitor blood pressure, electrolytes and renal function while on diuretics  CKD 3 Appears to be at his baseline creatinine 1.3 with GFR 54 Avoid nephrotoxins Monitor urine output Repeat BMP in the morning  Anemia of chronic disease/macrocytic anemia Hemoglobin dropped this morning from 9.7-8.9 MCV of 100.4 No sign of overt bleeding Obtain iron  studies, add on Repeat CBC in the morning  Mild thrombocytopenia Presented with platelet count of 111 Platelet count is improving up to 136K this morning  Prior polysubstance abuse with benzodiazepine and cocaine Last UDS on 05/04/2016+ for benzodiazepine and cocaine No repeated UDS during this admission  Chronic anxiety/depression Continue Zoloft, trazodone, Neurontin  COPD Continue inhalers  Hypertension Blood pressure is currently soft Continue to hold off atenolol Continue IV diuretics due to pulmonary edema impairing respiratory stability Maintain map greater than 65 and continue to monitor vital signs closely  Type 2 diabetes with hyperglycemia Hemoglobin A1c 7.6 on 09/10/2018 Continue insulin sliding scale Continue Levemir Avoid hypoglycemia    Code Status: DNR  Family Communication: None at bedside  Disposition Plan: Undetermined at this time.  Plan to DC once respiratory status is close to baseline.   Consultants:  PCCM  Procedures:  None  Antimicrobials:  Cefepime and azithromycin  DVT prophylaxis: Subcu Lovenox daily.   Objective: Vitals:   09/17/18 0729 09/17/18 0822 09/17/18 1108 09/17/18 1339  BP: (!) 137/57  108/62   Pulse: 86   85  Resp: 18   18  Temp:  97.8 F (36.6 C) 97.8 F (36.6 C)   TempSrc:  Axillary Oral   SpO2: 99%   100%  Weight:      Height:        Intake/Output Summary (Last 24 hours) at 09/17/2018 1542 Last data filed at 09/17/2018 1140 Gross per 24 hour  Intake -  Output 4700 ml  Net -4700 ml   Filed Weights   09/15/18 0317 09/16/18 0333 09/17/18 0351  Weight: 86.7 kg 85.4 kg 80.8 kg    Exam:  . General: 68 y.o. year-old male well-developed well-nourished no acute distress.  Somnolent but easily arousable to voices.   . Cardiovascular: Regular rate and rhythm no rubs or gallops.  No JVD or thyromegaly noted.   Marland Kitchen Respiratory: Very mild rales at bases.  No wheezing.  Poor inspiratory effort.  . Abdomen: Obese  nontender nondistended normal bowel sounds present.. . Musculoskeletal: No lower extremity edema.  2 out of 4 pulses in all 4 extremities.   Marland Kitchen Psychiatry: Mood is appropriate for condition and setting.   Data Reviewed: CBC: Recent Labs  Lab 09/11/18 0708 09/12/18 0646 09/13/18 0734 09/15/18 0401  WBC 4.9 8.1 9.5 11.4*  NEUTROABS 4.5  --   --   --   HGB 9.2* 9.7* 8.9* 9.2*  HCT 28.3* 28.9* 27.3* 26.9*  MCV 99.0 100.3* 100.4* 94.4  PLT 96* 124* 136* [id number]   Basic Metabolic Panel: Recent Labs  Lab 09/11/18 0708 09/12/18 0646 09/13/18 0734 09/14/18 0647 09/15/18 0401 09/16/18 0416  NA 133* 134* 131*  --  130* 130*  K 3.8 3.9 4.6  --  3.4* 3.5  CL 104 102 103  --  95* 94*  CO2 18* 22 17*  --  25 27  GLUCOSE 221* 196* 145*  --  194* 161*  BUN 34* 41* 40*  --  39* 33*  CREATININE 1.28* 1.33* 1.33*  --  1.30* 1.32*  CALCIUM 8.3* 8.6* 8.1*  --  8.0* 7.8*  MG  --  2.0 1.9 2.1 1.8  --   PHOS  --   --   --  3.0 2.1*  --    GFR: Estimated Creatinine Clearance: 52.8 mL/min (A) (by C-G formula based on SCr of 1.32 mg/dL (H)). Liver Function Tests: Recent Labs  Lab 09/15/18 0401 09/16/18 0416 09/17/18 [id number]  AST 26 17 14*  ALT 36 26 23  ALKPHOS 63 63 66  BILITOT 2.3* 3.1* 3.0*  PROT 4.9* 4.7* 4.8*  ALBUMIN 1.9* 1.6* 1.9*   No results for input(s): LIPASE, AMYLASE in the last 168 hours. No results for input(s): AMMONIA in the last 168 hours. Coagulation Profile: No results for input(s): INR, PROTIME in the last 168 hours. Cardiac Enzymes: No results for input(s): CKTOTAL, CKMB, CKMBINDEX, TROPONINI in the last 168 hours. BNP (last 3 results) No results for input(s): PROBNP in the last 8760 hours. HbA1C: No results for input(s): HGBA1C in the last 72 hours. CBG: Recent Labs  Lab 09/16/18 1132 09/16/18 1633 09/16/18 2052 09/17/18 0819 09/17/18 1110  GLUCAP 137* 231* 188* 152* 180*   Lipid Profile: No results for input(s): CHOL, HDL, LDLCALC, TRIG, CHOLHDL,  LDLDIRECT in the last 72 hours. Thyroid Function Tests: No results for input(s): TSH, T4TOTAL, FREET4, T3FREE, THYROIDAB in the last 72 hours. Anemia Panel: No results for input(s): VITAMINB12, FOLATE, FERRITIN, TIBC, IRON, RETICCTPCT in the last 72 hours. Urine analysis:    Component Value Date/Time   COLORURINE YELLOW 09/16/2018 [place] 09/16/2018 1715   LABSPEC 1.006 09/16/2018 1715   PHURINE 6.0 09/16/2018 1715   GLUCOSEU NEGATIVE 09/16/2018 1715   HGBUR MODERATE (A) 09/16/2018 1715   BILIRUBINUR NEGATIVE 09/16/2018 1715   BILIRUBINUR neg 09/10/2013 1308   KETONESUR NEGATIVE 09/16/2018 1715   PROTEINUR NEGATIVE 09/16/2018 1715   UROBILINOGEN 0.2 09/10/2013 1308   NITRITE NEGATIVE 09/16/2018 1715   LEUKOCYTESUR NEGATIVE 09/16/2018 1715   Sepsis Labs: @LABRCNTIP (procalcitonin:4,lacticidven:4)  ) Recent Results (from the past 240 hour(s))  SARS Coronavirus 2 [facility] order, Performed in [facility] hospital lab) Nasopharyngeal Nasopharyngeal Swab     Status: None   Collection Time: 09/10/18  9:12 AM   Specimen: Nasopharyngeal Swab  Result Value Ref Range Status   SARS Coronavirus 2 NEGATIVE NEGATIVE Final    Comment: (NOTE) If result is NEGATIVE SARS-CoV-2 target nucleic acids are NOT DETECTED. The SARS-CoV-2 RNA is generally detectable in upper and lower  respiratory specimens during the acute phase of infection. The lowest  concentration of SARS-CoV-2 viral copies this assay can detect is 250  copies / mL. A negative result does not preclude SARS-CoV-2 infection  and should not be used as the sole basis for treatment or other  patient management decisions.  A negative result may occur with  improper specimen collection / handling, submission of specimen other  than nasopharyngeal swab, presence of viral mutation(s) within the  areas targeted by this assay, and inadequate number of viral copies  (<250 copies / mL). A negative result must be combined  with clinical  observations, patient history, and epidemiological information. If result is POSITIVE SARS-CoV-2 target nucleic acids are DETECTED. The SARS-CoV-2 RNA is generally detectable in upper and lower  respiratory specimens dur ing the acute phase of infection.  Positive  results are indicative of active infection with SARS-CoV-2.  Clinical  correlation with patient history and other diagnostic information is  necessary to determine patient infection status.  Positive results do  not rule out bacterial infection or co-infection with other viruses. If result is PRESUMPTIVE POSTIVE SARS-CoV-2 nucleic acids MAY BE PRESENT.   A presumptive positive result was obtained on the submitted specimen  and confirmed on repeat testing.  While 2019 novel coronavirus  (SARS-CoV-2) nucleic acids may be present in the submitted sample  additional confirmatory testing may be necessary for epidemiological  and /  or clinical management purposes  to differentiate between  SARS-CoV-2 and other Sarbecovirus currently known to infect humans.  If clinically indicated additional testing with an alternate test  methodology [phone]) is advised. The SARS-CoV-2 RNA is generally  detectable in upper and lower respiratory sp ecimens during the acute  phase of infection. The expected result is Negative. Fact Sheet for Patients:  StrictlyIdeas.no Fact Sheet for Healthcare Providers: BankingDealers.co.za This test is not yet approved or cleared by the [place] FDA and has been authorized for detection and/or diagnosis of SARS-CoV-2 by FDA under an Emergency Use Authorization (EUA).  This EUA will remain in effect (meaning this test can be used) for the duration of the COVID-19 declaration under Section 564(b)(1) of the Act, 21 U.S.C. section 360bbb-3(b)(1), unless the authorization is terminated or revoked sooner. Performed at [facility], [place] [address] [place], [place] [address]   Blood Culture (routine x 2)     Status: None   Collection Time: 09/10/18 10:04 AM   Specimen: BLOOD  Result Value Ref Range Status   Specimen Description BLOOD SITE NOT SPECIFIED  Final   Special Requests   Final    BOTTLES DRAWN AEROBIC AND ANAEROBIC Blood Culture adequate volume   Culture   Final    NO GROWTH 5 DAYS Performed at [facility], [address], [place], [place] [address]    Report Status 09/15/2018 FINAL  Final  Blood Culture (routine x 2)     Status: None   Collection Time: 09/10/18 10:10 AM   Specimen: BLOOD LEFT ARM  Result Value Ref Range Status   Specimen Description BLOOD LEFT ARM  Final   Special Requests   Final    BOTTLES DRAWN AEROBIC AND ANAEROBIC Blood Culture adequate volume   Culture   Final    NO GROWTH 5 DAYS Performed at [facility], [place] [address] [place], [place] [address]    Report Status 09/15/2018 FINAL  Final  Respiratory Panel by PCR     Status: None   Collection Time: 09/10/18  1:20 PM   Specimen: Nasopharyngeal Swab; Respiratory  Result Value Ref Range Status   Adenovirus NOT DETECTED NOT DETECTED Final   Coronavirus 229E NOT DETECTED NOT DETECTED Final    Comment: (NOTE) The Coronavirus on the Respiratory Panel, DOES NOT test for the novel  Coronavirus (2019 nCoV)    Coronavirus HKU1 NOT DETECTED NOT DETECTED Final   Coronavirus NL63 NOT DETECTED NOT DETECTED Final   Coronavirus OC43 NOT DETECTED NOT DETECTED Final   Metapneumovirus NOT DETECTED NOT DETECTED Final   Rhinovirus / Enterovirus NOT DETECTED NOT DETECTED Final   Influenza A NOT DETECTED NOT DETECTED Final   Influenza B NOT DETECTED NOT DETECTED Final   Parainfluenza Virus 1 NOT DETECTED NOT DETECTED Final   Parainfluenza Virus 2 NOT DETECTED NOT DETECTED Final   Parainfluenza Virus 3 NOT DETECTED NOT DETECTED Final   Parainfluenza Virus 4 NOT DETECTED NOT DETECTED Final   Respiratory Syncytial Virus NOT DETECTED NOT  DETECTED Final   Bordetella pertussis NOT DETECTED NOT DETECTED Final   Chlamydophila pneumoniae NOT DETECTED NOT DETECTED Final   Mycoplasma pneumoniae NOT DETECTED NOT DETECTED Final    Comment: Performed at [facility], [place] [address], [place], [place] [address]  SARS Coronavirus 2 [facility] order, Performed in [facility] hospital lab)     Status: None   Collection Time: 09/13/18  3:43 PM  Result Value Ref  Range Status   SARS Coronavirus 2 NEGATIVE NEGATIVE Final    Comment: (NOTE) If result is NEGATIVE SARS-CoV-2 target nucleic acids are NOT DETECTED. The SARS-CoV-2 RNA is generally detectable in upper and lower  respiratory specimens during the acute phase of infection. The lowest  concentration of SARS-CoV-2 viral copies this assay can detect is 250  copies / mL. A negative result does not preclude SARS-CoV-2 infection  and should not be used as the sole basis for treatment or other  patient management decisions.  A negative result may occur with  improper specimen collection / handling, submission of specimen other  than nasopharyngeal swab, presence of viral mutation(s) within the  areas targeted by this assay, and inadequate number of viral copies  (<250 copies / mL). A negative result must be combined with clinical  observations, patient history, and epidemiological information. If result is POSITIVE SARS-CoV-2 target nucleic acids are DETECTED. The SARS-CoV-2 RNA is generally detectable in upper and lower  respiratory specimens dur ing the acute phase of infection.  Positive  results are indicative of active infection with SARS-CoV-2.  Clinical  correlation with patient history and other diagnostic information is  necessary to determine patient infection status.  Positive results do  not rule out bacterial infection or co-infection with other viruses. If result is PRESUMPTIVE POSTIVE SARS-CoV-2 nucleic acids MAY BE PRESENT.   A presumptive positive result was  obtained on the submitted specimen  and confirmed on repeat testing.  While 2019 novel coronavirus  (SARS-CoV-2) nucleic acids may be present in the submitted sample  additional confirmatory testing may be necessary for epidemiological  and / or clinical management purposes  to differentiate between  SARS-CoV-2 and other Sarbecovirus currently known to infect humans.  If clinically indicated additional testing with an alternate test  methodology [phone]) is advised. The SARS-CoV-2 RNA is generally  detectable in upper and lower respiratory sp ecimens during the acute  phase of infection. The expected result is Negative. Fact Sheet for Patients:  StrictlyIdeas.no Fact Sheet for Healthcare Providers: BankingDealers.co.za This test is not yet approved or cleared by the [place] FDA and has been authorized for detection and/or diagnosis of SARS-CoV-2 by FDA under an Emergency Use Authorization (EUA).  This EUA will remain in effect (meaning this test can be used) for the duration of the COVID-19 declaration under Section 564(b)(1) of the Act, 21 U.S.C. section 360bbb-3(b)(1), unless the authorization is terminated or revoked sooner. Performed at [facility], [place] [address], [place], [place] [address]   MRSA PCR Screening     Status: None   Collection Time: 09/13/18  6:11 PM   Specimen: Nasal Mucosa; Nasopharyngeal  Result Value Ref Range Status   MRSA by PCR NEGATIVE NEGATIVE Final    Comment:        The GeneXpert MRSA Assay (FDA approved for NASAL specimens only), is one component of a comprehensive MRSA colonization surveillance program. It is not intended to diagnose MRSA infection nor to guide or monitor treatment for MRSA infections. Performed at [facility], [place] [address], [place], [place] [address]   Respiratory Panel by PCR     Status: None   Collection Time: 09/13/18  6:12 PM   Specimen: Nasopharyngeal  Swab; Respiratory  Result Value Ref Range Status   Adenovirus NOT DETECTED NOT DETECTED Final   Coronavirus 229E NOT DETECTED NOT DETECTED Final    Comment: (NOTE) The Coronavirus on the Respiratory Panel, DOES NOT test for the  novel  Coronavirus (2019 nCoV)    Coronavirus HKU1 NOT DETECTED NOT DETECTED Final   Coronavirus NL63 NOT DETECTED NOT DETECTED Final   Coronavirus OC43 NOT DETECTED NOT DETECTED Final   Metapneumovirus NOT DETECTED NOT DETECTED Final   Rhinovirus / Enterovirus NOT DETECTED NOT DETECTED Final   Influenza A NOT DETECTED NOT DETECTED Final   Influenza B NOT DETECTED NOT DETECTED Final   Parainfluenza Virus 1 NOT DETECTED NOT DETECTED Final   Parainfluenza Virus 2 NOT DETECTED NOT DETECTED Final   Parainfluenza Virus 3 NOT DETECTED NOT DETECTED Final   Parainfluenza Virus 4 NOT DETECTED NOT DETECTED Final   Respiratory Syncytial Virus NOT DETECTED NOT DETECTED Final   Bordetella pertussis NOT DETECTED NOT DETECTED Final   Chlamydophila pneumoniae NOT DETECTED NOT DETECTED Final   Mycoplasma pneumoniae NOT DETECTED NOT DETECTED Final    Comment: Performed at [facility], [place] [address] [place], [place] [address]  Culture, Urine     Status: None   Collection Time: 09/16/18  5:49 PM   Specimen: Urine, Clean Catch  Result Value Ref Range Status   Specimen Description URINE, CLEAN CATCH  Final   Special Requests NONE  Final   Culture   Final    NO GROWTH Performed at [facility], [place] [address], [place], [place] [address]    Report Status 09/17/2018 FINAL  Final      Studies: No results found.  Scheduled Meds: . aspirin EC  81 mg Oral Daily  . atenolol  12.5 mg Oral Daily  . atorvastatin  20 mg Oral QHS  . enoxaparin (LOVENOX) injection  40 mg Subcutaneous Daily  . fluticasone  2 spray Each Nare Daily  . gabapentin  300 mg Oral q morning - 10a  . gabapentin  600 mg Oral QHS  . guaiFENesin  600 mg Oral BID  . insulin aspart  0-15 Units  Subcutaneous TID WC  . insulin aspart  0-5 Units Subcutaneous QHS  . insulin detemir  8 Units Subcutaneous BID  . ipratropium  0.5 mg Inhalation Q6H  . levalbuterol  0.63 mg Inhalation Q6H  . pantoprazole  40 mg Oral BID  . senna-docusate  1 tablet Oral QHS  . sertraline  50 mg Oral Daily  . traZODone  50 mg Oral QHS    Continuous Infusions: . azithromycin 500 mg (09/17/18 0958)  . ceFEPime (MAXIPIME) IV 2 g (09/17/18 [id number])     LOS: 7 days     Kayleen Memos, MD [facility] Pager [phone]  If 7PM-7AM, please contact night-coverage [email or web address] Password [facility] 09/17/2018, 3:42 PM

## 2018-09-17 NOTE — NC FL2 (Signed)
[place] LEVEL OF CARE SCREENING TOOL     IDENTIFICATION  Patient Name: Adrian Andrews Birthdate: 1950-01-31 Sex: male Admission Date (Current Location): 09/10/2018  [facility] and [place] Number:  Herbalist and Address:  [facility] [place], [place] [address] [place], [place] [address]      Provider Number: [id number]  Attending Physician Name and Address:  Kayleen Memos, DO  Relative Name and Phone Number:       Current Level of Care: Hospital Recommended Level of Care: [place] Prior Approval Number:    Date Approved/Denied:   PASRR Number: Manual review  Discharge Plan: SNF    Current Diagnoses: Patient Active Problem List   Diagnosis Date Noted  . Acute on chronic diastolic CHF (congestive heart failure) ([place]) 09/16/2018  . Hyperbilirubinemia 09/16/2018  . RLL pneumonia ([place]) 09/10/2018  . Sepsis due to pneumonia ([place]) 09/10/2018  . Essential hypertension 09/10/2018  . Dyslipidemia 09/10/2018  . COPD with acute exacerbation ([place]) 02/14/2018  . Nicotine use disorder 02/14/2018  . CKD (chronic kidney disease) stage 3, GFR 30-59 ml/min (HCC) 02/14/2018  . COPD (chronic obstructive pulmonary disease) ([place]) 02/13/2018  . Lactic acidosis 02/13/2018  . Type 2 diabetes mellitus with other specified complication ([place]) [id number]  . Acute on chronic respiratory failure with hypoxia ([place]) 12/21/2017  . CAP (community acquired pneumonia) 12/21/2017  . AKI (acute kidney injury) ([place]) 12/21/2017  . COPD exacerbation ([place]) 12/20/2017  . Heart failure ([place]) 12/20/2017  . Carotid artery stenosis, symptomatic, left 02/21/2017    Orientation RESPIRATION BLADDER Height & Weight     Self, Situation, Time, Place  O2( 4L) Incontinent Weight: 178 lb 2.1 oz (80.8 kg) Height:  5\' 9"  (175.3 cm)  BEHAVIORAL SYMPTOMS/MOOD NEUROLOGICAL BOWEL NUTRITION STATUS      Incontinent Diet(see DC summary)  AMBULATORY STATUS  COMMUNICATION OF NEEDS Skin   Extensive Assist Verbally Normal                       Personal Care Assistance Level of Assistance  Bathing, Dressing, Feeding Bathing Assistance: Limited assistance Feeding assistance: Independent Dressing Assistance: Limited assistance     Functional Limitations Info  Sight, Speech, Hearing Sight Info: Adequate Hearing Info: Adequate Speech Info: Adequate    SPECIAL CARE FACTORS FREQUENCY  PT (By licensed PT), OT (By licensed OT), Speech therapy     PT Frequency: 5x/wk OT Frequency: 5x/wk     Speech Therapy Frequency: 5x/wk      Contractures Contractures Info: Not present    Additional Factors Info  Code Status, Allergies, Psychotropic, Insulin Sliding Scale Code Status Info: DNR Allergies Info: Clindamycin/lincomycin, Contrast Media Iodinated Diagnostic Agents, Metformin And Related Psychotropic Info: Zoloft 50mg  daily Insulin Sliding Scale Info: 0-15 units 3x/day with meals; 0-5 units daily at bed; Levemir 8 units 2x/day       Current Medications (09/17/2018):  This is the current hospital active medication list Current Facility-Administered Medications  Medication Dose Route Frequency Provider Last Rate Last Dose  . albuterol (PROVENTIL) (2.5 MG/3ML) 0.083% nebulizer solution 2.5 mg  2.5 mg Nebulization Q2H PRN Karmen Bongo, MD   2.5 mg at 09/12/18 0342  . alum & mag hydroxide-simeth (MAALOX/MYLANTA) 200-200-20 MG/5ML suspension 30 mL  30 mL Oral Q6H PRN Bodenheimer, Charles A, NP   30 mL at 09/14/18 2242  . aspirin EC tablet 81 mg  81 mg Oral Daily Karmen Bongo, MD   81 mg at  09/17/18 0948  . atenolol (TENORMIN) tablet 12.5 mg  12.5 mg Oral Daily Irene Pap N, DO   12.5 mg at 09/17/18 0949  . atorvastatin (LIPITOR) tablet 20 mg  20 mg Oral QHS Karmen Bongo, MD   20 mg at 09/16/18 2234  . azithromycin (ZITHROMAX) 500 mg in sodium chloride 0.9 % 250 mL IVPB  500 mg Intravenous Q24H Karmen Bongo, MD 250 mL/hr at  09/17/18 0958 500 mg at 09/17/18 0958  . ceFEPIme (MAXIPIME) 2 g in sodium chloride 0.9 % 100 mL IVPB  2 g Intravenous Q8H Wendee Beavers T, MD 200 mL/hr at 09/17/18 0608 2 g at 09/17/18 0608  . enoxaparin (LOVENOX) injection 40 mg  40 mg Subcutaneous Daily Irene Pap N, DO   40 mg at 09/17/18 0949  . fluticasone (FLONASE) 50 MCG/ACT nasal spray 2 spray  2 spray Each Nare Daily Irene Pap N, DO   2 spray at 09/14/18 0857  . gabapentin (NEURONTIN) capsule 300 mg  300 mg Oral q morning - 10a Wendee Beavers T, MD   300 mg at 09/17/18 0948  . gabapentin (NEURONTIN) capsule 600 mg  600 mg Oral QHS Wendee Beavers T, MD   600 mg at 09/16/18 2234  . guaiFENesin (MUCINEX) 12 hr tablet 600 mg  600 mg Oral BID Karmen Bongo, MD   600 mg at 09/17/18 0948  . insulin aspart (novoLOG) injection 0-15 Units  0-15 Units Subcutaneous TID WC Karmen Bongo, MD   3 Units at 09/17/18 1147  . insulin aspart (novoLOG) injection 0-5 Units  0-5 Units Subcutaneous QHS Karmen Bongo, MD   3 Units at 09/13/18 2143  . insulin detemir (LEVEMIR) injection 8 Units  8 Units Subcutaneous BID Mercy Riding, MD   8 Units at 09/17/18 0948  . ipratropium (ATROVENT) nebulizer solution 0.5 mg  0.5 mg Inhalation Q6H Hall, Carole N, DO   0.5 mg at 09/17/18 1339  . levalbuterol (XOPENEX) nebulizer solution 0.63 mg  0.63 mg Inhalation Q6H Hall, Carole N, DO   0.63 mg at 09/17/18 1339  . lip balm (CARMEX) ointment   Topical PRN Samuella Cota, MD   1 application at [phone]  . loperamide (IMODIUM) capsule 2 mg  2 mg Oral PRN Bodenheimer, Charles A, NP      . LORazepam (ATIVAN) injection 0.5 mg  0.5 mg Intravenous Q6H PRN Irene Pap N, DO   0.5 mg at 09/17/18 1151  . ondansetron (ZOFRAN) injection 4 mg  4 mg Intravenous Q6H PRN Bodenheimer, Charles A, NP   4 mg at 09/12/18 1127  . pantoprazole (PROTONIX) EC tablet 40 mg  40 mg Oral BID Wendee Beavers T, MD   40 mg at 09/16/18 2235  . polyethylene glycol (MIRALAX / GLYCOLAX) packet 17 g   17 g Oral Daily PRN Wendee Beavers T, MD   17 g at 09/12/18 1127  . senna-docusate (Senokot-S) tablet 1 tablet  1 tablet Oral QHS Mercy Riding, MD   1 tablet at 09/16/18 2235  . sertraline (ZOLOFT) tablet 50 mg  50 mg Oral Daily Karmen Bongo, MD   50 mg at 09/16/18 0936  . sodium chloride (OCEAN) 0.65 % nasal spray 1 spray  1 spray Each Nare PRN Irene Pap N, DO   1 spray at 09/14/18 1805  . traMADol (ULTRAM) tablet 50 mg  50 mg Oral TID PRN Karmen Bongo, MD   50 mg at 09/15/18 1303  . traZODone (DESYREL) tablet 50 mg  50 mg Oral QHS Karmen Bongo, MD   50 mg at 09/16/18 2235     Discharge Medications: Please see discharge summary for a list of discharge medications.  Relevant Imaging Results:  Relevant Lab Results:   Additional Information SS#: [id number]  Geralynn Ochs, LCSW

## 2018-09-17 NOTE — Plan of Care (Signed)
  Problem: Education: Goal: Knowledge of General Education information will improve Description: Including pain rating scale, medication(s)/side effects and non-pharmacologic comfort measures Outcome: Progressing   Problem: Clinical Measurements: Goal: Will remain free from infection Outcome: Progressing   Problem: Nutrition: Goal: Adequate nutrition will be maintained Outcome: Progressing   Problem: Elimination: Goal: Will not experience complications related to urinary retention Outcome: Progressing   Problem: Pain Managment: Goal: General experience of comfort will improve Outcome: Progressing   Problem: Safety: Goal: Ability to remain free from injury will improve Outcome: Progressing

## 2018-09-17 NOTE — Progress Notes (Addendum)
Physical Therapy Treatment Patient Details Name: Adrian Andrews MRN: [id number] DOB: February 07, 1950 Today's Date: 09/17/2018    History of Present Illness Adrian Andrews is a 68 y.o. male with medical history significant of CVA; carotid stenosis s/p B stents; HTN; HLD; DM; COPD on 2L home O2; and stage 3 CKD presenting with fever, cough, and diarrhea. Found to have Sepsis due to pneumonia    PT Comments    Pt with poor activity tolerance and fatigue quickly requiring frequent rest breaks. Pt remains anxious and vigilant in monitoring SpO2.    Follow Up Recommendations  SNF     Equipment Recommendations  Other (comment)(TBD)    Recommendations for Other Services       Precautions / Restrictions Precautions Precautions: Fall;Other (comment) Precaution Comments: watch O2 Restrictions Weight Bearing Restrictions: No    Mobility  Bed Mobility Overal bed mobility: Needs Assistance Bed Mobility: Sit to Supine     Supine to sit: Min assist Sit to supine: Min guard   General bed mobility comments: Assist to elevate trunk into sitting   Transfers Overall transfer level: Needs assistance Equipment used: Rolling walker (2 wheeled);None Transfers: Sit to/from [facility] Sit to Stand: Min assist         General transfer comment: Assist to bring hips up and for balance. Stood x 4  Ambulation/Gait Ambulation/Gait assistance: Herbalist (Feet): 2 Feet Assistive device: Rolling walker (2 wheeled) Gait Pattern/deviations: Step-to pattern;Shuffle Gait velocity: decr Gait velocity interpretation: <1.31 ft/sec, indicative of household ambulator General Gait Details: side stepped up side of bed toward HOB. Pt on 4L with SpO2 >90%   Stairs             Wheelchair Mobility    Modified Rankin (Stroke Patients Only)       Balance Overall balance assessment: Needs assistance Sitting-balance support: No upper extremity supported;Feet  supported Sitting balance-Leahy Scale: Good     Standing balance support: Bilateral upper extremity supported Standing balance-Leahy Scale: Poor Standing balance comment: walker and min guard for static standing                            Cognition Arousal/Alertness: Awake/alert Behavior During Therapy: WFL for tasks assessed/performed Overall Cognitive Status: Within Functional Limits for tasks assessed                                        Exercises General Exercises - Lower Extremity Ankle Circles/Pumps: AROM;Both;5 reps;Supine Long Arc Quad: AROM;Both;10 reps;Seated    General Comments        Pertinent Vitals/Pain Pain Assessment: Faces Faces Pain Scale: Hurts little more Pain Location: rectal area Pain Descriptors / Indicators: Sore Pain Intervention(s): Limited activity within patient's tolerance;Monitored during session;Other (comment)(cream applied)    Home Living                      Prior Function            PT Goals (current goals can now be found in the care plan section) Progress towards PT goals: Progressing toward goals    Frequency    Min 3X/week      PT Plan Current plan remains appropriate    Co-evaluation              AM-PAC PT "6 Clicks" Mobility  Outcome Measure  Help needed turning from your back to your side while in a flat bed without using bedrails?: A Little Help needed moving from lying on your back to sitting on the side of a flat bed without using bedrails?: A Little Help needed moving to and from a bed to a chair (including a wheelchair)?: A Little Help needed standing up from a chair using your arms (e.g., wheelchair or bedside chair)?: A Little Help needed to walk in hospital room?: A Lot Help needed climbing 3-5 steps with a railing? : Total 6 Click Score: 15    End of Session Equipment Utilized During Treatment: Oxygen Activity Tolerance: Patient limited by fatigue Patient  left: in bed;with call bell/phone within reach Nurse Communication: Mobility status PT Visit Diagnosis: Other abnormalities of gait and mobility (R26.89);Muscle weakness (generalized) (M62.81)     Time: [id number] PT Time Calculation (min) (ACUTE ONLY): 20 min  Charges:  $Therapeutic Activity: 8-22 mins                     [place] Pager [phone] Office [place] 09/17/2018, 4:04 PM

## 2018-09-18 ENCOUNTER — Encounter (HOSPITAL_COMMUNITY): Payer: Medicare Other

## 2018-09-18 ENCOUNTER — Inpatient Hospital Stay (HOSPITAL_COMMUNITY): Payer: Medicare Other

## 2018-09-18 DIAGNOSIS — J9621 Acute and chronic respiratory failure with hypoxia: Secondary | ICD-10-CM

## 2018-09-18 LAB — GLUCOSE, CAPILLARY
Glucose-Capillary: 146 mg/dL — ABNORMAL HIGH (ref 70–99)
Glucose-Capillary: 166 mg/dL — ABNORMAL HIGH (ref 70–99)
Glucose-Capillary: 110 mg/dL — ABNORMAL HIGH (ref 70–99)
Glucose-Capillary: 143 mg/dL — ABNORMAL HIGH (ref 70–99)

## 2018-09-18 LAB — STREP PNEUMONIAE URINARY ANTIGEN: Strep Pneumo Urinary Antigen: NEGATIVE

## 2018-09-18 LAB — CBC
HCT: 21 % — ABNORMAL LOW (ref 39.0–52.0)
Hemoglobin: 7.1 g/dL — ABNORMAL LOW (ref 13.0–17.0)
MCH: 32.3 pg (ref 26.0–34.0)
MCHC: 33.8 g/dL (ref 30.0–36.0)
MCV: 95.5 fL (ref 80.0–100.0)
Platelets: 290 10*3/uL (ref 150–400)
RBC: 2.2 MIL/uL — ABNORMAL LOW (ref 4.22–5.81)
RDW: 14.9 % (ref 11.5–15.5)
WBC: 13.7 10*3/uL — ABNORMAL HIGH (ref 4.0–10.5)
nRBC: 0.4 % — ABNORMAL HIGH (ref 0.0–0.2)

## 2018-09-18 LAB — BLOOD GAS, ARTERIAL
Acid-Base Excess: 4.9 mmol/L — ABNORMAL HIGH (ref 0.0–2.0)
Bicarbonate: 28.3 mmol/L — ABNORMAL HIGH (ref 20.0–28.0)
Drawn by: 27553
O2 Content: 15 L/min
O2 Saturation: 82.5 %
Patient temperature: 98.6
pCO2 arterial: 37.8 mmHg (ref 32.0–48.0)
pH, Arterial: 7.487 — ABNORMAL HIGH (ref 7.350–7.450)
pO2, Arterial: 45 mmHg — ABNORMAL LOW (ref 83.0–108.0)

## 2018-09-18 LAB — COMPREHENSIVE METABOLIC PANEL
ALT: 22 U/L (ref 0–44)
AST: 17 U/L (ref 15–41)
Albumin: 1.8 g/dL — ABNORMAL LOW (ref 3.5–5.0)
Alkaline Phosphatase: 62 U/L (ref 38–126)
Anion gap: 7 (ref 5–15)
BUN: 20 mg/dL (ref 8–23)
CO2: 28 mmol/L (ref 22–32)
Calcium: 7.8 mg/dL — ABNORMAL LOW (ref 8.9–10.3)
Chloride: 99 mmol/L (ref 98–111)
Creatinine, Ser: 1.15 mg/dL (ref 0.61–1.24)
GFR calc Af Amer: >60 mL/min (ref >60)
GFR calc non Af Amer: >60 mL/min (ref >60)
Glucose, Bld: 141 mg/dL — ABNORMAL HIGH (ref 70–99)
Potassium: 3.7 mmol/L (ref 3.5–5.1)
Sodium: 134 mmol/L — ABNORMAL LOW (ref 135–145)
Total Bilirubin: 1.8 mg/dL — ABNORMAL HIGH (ref 0.3–1.2)
Total Protein: 4.6 g/dL — ABNORMAL LOW (ref 6.5–8.1)

## 2018-09-18 LAB — FERRITIN: Ferritin: 629 ng/mL — ABNORMAL HIGH (ref 24–336)

## 2018-09-18 LAB — HEMOGLOBIN AND HEMATOCRIT, BLOOD
HCT: 23.9 % — ABNORMAL LOW (ref 39.0–52.0)
Hemoglobin: 8.1 g/dL — ABNORMAL LOW (ref 13.0–17.0)

## 2018-09-18 LAB — PROCALCITONIN: Procalcitonin: 0.47 ng/mL

## 2018-09-18 LAB — IRON AND TIBC
Iron: 97 ug/dL (ref 45–182)
Saturation Ratios: 38 % (ref 17.9–39.5)
TIBC: 258 ug/dL (ref 250–450)
UIBC: 161 ug/dL

## 2018-09-18 LAB — RETICULOCYTES
Immature Retic Fract: 40.5 % — ABNORMAL HIGH (ref 2.3–15.9)
RBC.: 2.43 MIL/uL — ABNORMAL LOW (ref 4.22–5.81)
Retic Count, Absolute: 93.1 10*3/uL (ref 19.0–186.0)
Retic Ct Pct: 3.8 % — ABNORMAL HIGH (ref 0.4–3.1)

## 2018-09-18 LAB — VITAMIN B12: Vitamin B-12: 874 pg/mL (ref 180–914)

## 2018-09-18 LAB — PREPARE RBC (CROSSMATCH)

## 2018-09-18 LAB — D-DIMER, QUANTITATIVE: D-Dimer, Quant: 2.36 ug/mL-FEU — ABNORMAL HIGH (ref 0.00–0.50)

## 2018-09-18 MED ORDER — SODIUM CHLORIDE 0.9 % IV SOLN
1.0000 g | Freq: Three times a day (TID) | INTRAVENOUS | Status: DC
  Administered 2018-09-18 – 2018-09-21 (×7): 1 g via INTRAVENOUS
  Filled 2018-09-18 (×12): qty 1

## 2018-09-18 MED ORDER — SODIUM CHLORIDE 0.9% IV SOLUTION
Freq: Once | INTRAVENOUS | Status: AC
  Administered 2018-09-18: 12:00:00 via INTRAVENOUS

## 2018-09-18 MED ORDER — SODIUM CHLORIDE 0.9 % IV BOLUS
1000.0000 mL | Freq: Once | INTRAVENOUS | Status: AC
  Administered 2018-09-18: 1000 mL via INTRAVENOUS

## 2018-09-18 MED ORDER — LORAZEPAM 2 MG/ML IJ SOLN
0.5000 mg | Freq: Once | INTRAMUSCULAR | Status: AC
  Administered 2018-09-18: 0.5 mg via INTRAVENOUS

## 2018-09-18 MED ORDER — CHLORHEXIDINE GLUCONATE CLOTH 2 % EX PADS
6.0000 | MEDICATED_PAD | Freq: Every day | CUTANEOUS | Status: DC
  Administered 2018-09-18 – 2018-09-28 (×4): 6 via TOPICAL

## 2018-09-18 MED ORDER — ALBUMIN HUMAN 25 % IV SOLN
50.0000 g | Freq: Once | INTRAVENOUS | Status: AC
  Administered 2018-09-18: 50 g via INTRAVENOUS
  Filled 2018-09-18: qty 50

## 2018-09-18 MED ORDER — PHENYLEPHRINE HCL-NACL 10-0.9 MG/250ML-% IV SOLN
0.0000 ug/min | INTRAVENOUS | Status: DC
  Administered 2018-09-18: 18:00:00 10 ug/min via INTRAVENOUS
  Filled 2018-09-18 (×3): qty 250

## 2018-09-18 MED ORDER — TRAMADOL HCL 50 MG PO TABS: 50.0000 mg | ORAL_TABLET | Freq: Every day | ORAL | 0 refills | Status: DC | PRN

## 2018-09-18 NOTE — Progress Notes (Signed)
[place] Progress Note Patient Name: Adrian Andrews DOB: [birth year] MRN: [id number]   Date of Service  09/18/2018  HPI/Events of Note  Discussed with bedside RN Marya Amsler need for blood transfusion.    The patient was transferred to the ICU due to hypotension, and is currently on neosynephrine.  The patient is being treated for sepsis secondary pneumonia. Hgb this AM was 7.1 and was ordered for blood transfusion but patient never received the blood.  Repeat H&H showed hgb was 8.1/24 <-- 7.1/21.  eICU Interventions  Hold off on blood transfusion.  Continue neosynephrine.  Continue O2 supplementation.     Intervention Category Minor Interventions: Other:  Elsie Lincoln 09/18/2018, 9:37 PM

## 2018-09-18 NOTE — Discharge Instructions (Signed)
Community-Acquired Pneumonia, Adult °Pneumonia is an infection of the lungs. It causes swelling in the airways of the lungs. Mucus and fluid may also build up inside the airways. °One type of pneumonia can happen while a person is in a hospital. A different type can happen when a person is not in a hospital (community-acquired pneumonia).  °What are the causes? ° °This condition is caused by germs (viruses, bacteria, or fungi). Some types of germs can be passed from one person to another. This can happen when you breathe in droplets from the cough or sneeze of an infected person. °What increases the risk? °You are more likely to develop this condition if you: °· Have a long-term (chronic) disease, such as: °? Chronic obstructive pulmonary disease (COPD). °? Asthma. °? Cystic fibrosis. °? Congestive heart failure. °? Diabetes. °? Kidney disease. °· Have HIV. °· Have sickle cell disease. °· Have had your spleen removed. °· Do not take good care of your teeth and mouth (poor dental hygiene). °· Have a medical condition that increases the risk of breathing in droplets from your own mouth and nose. °· Have a weakened body defense system (immune system). °· Are a smoker. °· Travel to areas where the germs that cause this illness are common. °· Are around certain animals or the places they live. °What are the signs or symptoms? °· A dry cough. °· A wet (productive) cough. °· Fever. °· Sweating. °· Chest pain. This often happens when breathing deeply or coughing. °· Fast breathing or trouble breathing. °· Shortness of breath. °· Shaking chills. °· Feeling tired (fatigue). °· Muscle aches. °How is this treated? °Treatment for this condition depends on many things. Most adults can be treated at home. In some cases, treatment must happen in a hospital. Treatment may include: °· Medicines given by mouth or through an IV tube. °· Being given extra oxygen. °· Respiratory therapy. °In rare cases, treatment for very bad pneumonia  may include: °· Using a machine to help you breathe. °· Having a procedure to remove fluid from around your lungs. °Follow these instructions at home: °Medicines °· Take over-the-counter and prescription medicines only as told by your doctor. °? Only take cough medicine if you are losing sleep. °· If you were prescribed an antibiotic medicine, take it as told by your doctor. Do not stop taking the antibiotic even if you start to feel better. °General instructions ° °· Sleep with your head and neck raised (elevated). You can do this by sleeping in a recliner or by putting a few pillows under your head. °· Rest as needed. Get at least 8 hours of sleep each night. °· Drink enough water to keep your pee (urine) pale yellow. °· Eat a healthy diet that includes plenty of vegetables, fruits, whole grains, low-fat dairy products, and lean protein. °· Do not use any products that contain nicotine or tobacco. These include cigarettes, e-cigarettes, and chewing tobacco. If you need help quitting, ask your doctor. °· Keep all follow-up visits as told by your doctor. This is important. °How is this prevented? °A shot (vaccine) can help prevent pneumonia. Shots are often suggested for: °· People older than 70 years of age. °· People older than 70 years of age who: °? Are having cancer treatment. °? Have long-term (chronic) lung disease. °? Have problems with their body's defense system. °You may also prevent pneumonia if you take these actions: °· Get the flu (influenza) shot every year. °· Go to the dentist as   often as told.  Wash your hands often. If you cannot use soap and water, use hand sanitizer. Contact a doctor if:  You have a fever.  You lose sleep because your cough medicine does not help. Get help right away if:  You are short of breath and it gets worse.  You have more chest pain.  Your sickness gets worse. This is very serious if: ? You are an older adult. ? Your body's defense system is weak.  You  cough up blood. Summary  Pneumonia is an infection of the lungs.  Most adults can be treated at home. Some will need treatment in a hospital.  Drink enough water to keep your pee pale yellow.  Get at least 8 hours of sleep each night. This information is not intended to replace advice given to you by your health care provider. Make sure you discuss any questions you have with your health care provider. Document Released: 06/19/2007 Document Revised: 04/22/2018 Document Reviewed: 08/28/2017 Elsevier Patient Education  [employer].   Hypoxemia  Hypoxemia occurs when the blood does not contain enough oxygen. The body cannot work well when it does not have enough oxygen because every part of the body needs oxygen. Oxygen enters the lungs when we breathe in, then it travels to all parts of the body through the blood. Hypoxemia can develop suddenly or slowly. What are the causes? Common causes of this condition include:  Long-term (chronic) lung diseases, such as chronic obstructive pulmonary disease (COPD) or interstitial lung disease.  Disorders that affect breathing at night, such as sleep apnea.  Fluid buildup in the lungs (pulmonary edema).  Lung infection (pneumonia).  Lung or throat cancer.  Abnormal blood flow that bypasses the lungs (having a shunt).  Certain diseases that affect nerves or muscles.  A collapsed lung (pneumothorax).  A blood clot in the lungs (pulmonary embolus).  Certain types of heart disease.  Slow or shallow breathing (hypoventilation).  Certain medicines.  High altitudes.  Toxic chemicals, smoke, and gases. What are the signs or symptoms? In some cases, there may be no symptoms of this condition. If you do have symptoms, they may include:  Shortness of breath (dyspnea).  Bluish color of the skin, lips, or nail beds.  Breathing that is fast, noisy, or shallow.  A fast heartbeat.  Feeling tired or sleepy.  Feeling confused or  worried. If hypoxemia develops quickly, you will likely have dyspnea. If hypoxemia develops slowly over months or years, you may not notice any symptoms. How is this diagnosed? This condition is diagnosed by:  A physical exam.  Blood tests.  A test that measures the percentage of oxygen in your blood (pulse oximetry). This is done with a sensor that is placed on your finger, toe, or earlobe. How is this treated? Treatment for this condition depends on the underlying cause of your hypoxemia. You will likely be treated with oxygen therapy to restore your blood oxygen level. Depending on the cause of your hypoxemia, you may need oxygen therapy for a short time (weeks or months), or you may need it for the rest of your life. Your health care provider may also recommend other therapies to treat the underlying cause of your hypoxemia. Follow these instructions at home:   Take over-the-counter and prescription medicines only as told by your health care provider.  If you are on oxygen therapy, follow oxygen safety precautions as directed by your health care provider. These may include: ? Always having  a backup supply of oxygen. ? Not allowing anyone to smoke or have a fire around oxygen. ? Handling oxygen tanks carefully and as instructed.  Do not use any products that contain nicotine or tobacco, such as cigarettes and e-cigarettes. If you need help quitting, ask your health care provider. Stay away from people who smoke.  Keep all follow-up visits as told by your health care provider. This is important. Contact a health care provider if:  You have any concerns about your oxygen therapy.  You have trouble breathing, even during or after treatment.  You become short of breath when you exercise.  You are tired when you wake up.  You have a headache when you wake up. Get help right away if:  Your shortness of breath gets worse, especially with normal or minimal activity.  You have a  bluish color of the skin, lips, or nail beds.  You become confused or you cannot think properly.  You cough up dark mucus or blood.  You have chest pain.  You have a fever. Summary  Hypoxemia occurs when the blood does not contain enough oxygen.  Hypoxemia may or may not cause symptoms. Often, the main symptom is shortness of breath (dyspnea).  Depending on the cause of your hypoxemia, you may need oxygen therapy for a short time (weeks or months), or you may need it for the rest of your life.  If you are on oxygen therapy, follow oxygen safety precautions as directed by your health care provider. This information is not intended to replace advice given to you by your health care provider. Make sure you discuss any questions you have with your health care provider. Document Released: 07/16/2010 Document Revised: 10/21/2017 Document Reviewed: 12/05/2015 Elsevier Patient Education  2020 [employer].  Chronic Obstructive Pulmonary Disease Chronic obstructive pulmonary disease (COPD) is a long-term (chronic) lung problem. When you have COPD, it is hard for air to get in and out of your lungs. Usually the condition gets worse over time, and your lungs will never return to normal. There are things you can do to keep yourself as healthy as possible.  Your doctor may treat your condition with: ? Medicines. ? Oxygen. ? Lung surgery.  Your doctor may also recommend: ? Rehabilitation. This includes steps to make your body work better. It may involve a team of specialists. ? Quitting smoking, if you smoke. ? Exercise and changes to your diet. ? Comfort measures (palliative care). Follow these instructions at home: Medicines  Take over-the-counter and prescription medicines only as told by your doctor.  Talk to your doctor before taking any cough or allergy medicines. You may need to avoid medicines that cause your lungs to be dry. Lifestyle  If you smoke, stop. Smoking makes the  problem worse. If you need help quitting, ask your doctor.  Avoid being around things that make your breathing worse. This may include smoke, chemicals, and fumes.  Stay active, but remember to rest as well.  Learn and use tips on how to relax.  Make sure you get enough sleep. Most adults need at least 7 hours of sleep every night.  Eat healthy foods. Eat smaller meals more often. Rest before meals. Controlled breathing Learn and use tips on how to control your breathing as told by your doctor. Try:  Breathing in (inhaling) through your nose for 1 second. Then, pucker your lips and breath out (exhale) through your lips for 2 seconds.  Putting one hand on your belly (  abdomen). Breathe in slowly through your nose for 1 second. Your hand on your belly should move out. Pucker your lips and breathe out slowly through your lips. Your hand on your belly should move in as you breathe out.  Controlled coughing Learn and use controlled coughing to clear mucus from your lungs. Follow these steps: 1. Lean your head a little forward. 2. Breathe in deeply. 3. Try to hold your breath for 3 seconds. 4. Keep your mouth slightly open while coughing 2 times. 5. Spit any mucus out into a tissue. 6. Rest and do the steps again 1 or 2 times as needed. General instructions  Make sure you get all the shots (vaccines) that your doctor recommends. Ask your doctor about a flu shot and a pneumonia shot.  Use oxygen therapy and pulmonary rehabilitation if told by your doctor. If you need home oxygen therapy, ask your doctor if you should buy a tool to measure your oxygen level (oximeter).  Make a COPD action plan with your doctor. This helps you to know what to do if you feel worse than usual.  Manage any other conditions you have as told by your doctor.  Avoid going outside when it is very hot, cold, or humid.  Avoid people who have a sickness you can catch (contagious).  Keep all follow-up visits as  told by your doctor. This is important. Contact a doctor if:  You cough up more mucus than usual.  There is a change in the color or thickness of the mucus.  It is harder to breathe than usual.  Your breathing is faster than usual.  You have trouble sleeping.  You need to use your medicines more often than usual.  You have trouble doing your normal activities such as getting dressed or walking around the house. Get help right away if:  You have shortness of breath while resting.  You have shortness of breath that stops you from: ? Being able to talk. ? Doing normal activities.  Your chest hurts for longer than 5 minutes.  Your skin color is more blue than usual.  Your pulse oximeter shows that you have low oxygen for longer than 5 minutes.  You have a fever.  You feel too tired to breathe normally. Summary  Chronic obstructive pulmonary disease (COPD) is a long-term lung problem.  The way your lungs work will never return to normal. Usually the condition gets worse over time. There are things you can do to keep yourself as healthy as possible.  Take over-the-counter and prescription medicines only as told by your doctor.  If you smoke, stop. Smoking makes the problem worse. This information is not intended to replace advice given to you by your health care provider. Make sure you discuss any questions you have with your health care provider. Document Released: 06/19/2007 Document Revised: 12/13/2016 Document Reviewed: 02/05/2016 Elsevier Patient Education  2020 [employer].

## 2018-09-18 NOTE — Progress Notes (Signed)
Pharmacy Antibiotic Note  Adrian Andrews is a 68 y.o. male admitted on 09/10/2018 with cough and fever concerning for PNA. The patient was started on Rocephin + Azithro from 8/27-8/29, then transitioned to Cefepime + Azithro on 8/29. Imaging today concern for worsening opacities. Pharmacy has been consulted to transition the patient to Meropenem.   Plan: - Start Meropenem 1g IV every 8 hours -  Will continue to follow renal function, culture results, LOT, and antibiotic de-escalation plans   Height: 5\' 9"  (175.3 cm) Weight: 173 lb 4.5 oz (78.6 kg) IBW/kg (Calculated) : 70.7  Temp (24hrs), Avg:98.2 F (36.8 C), Min:97.5 F (36.4 C), Max:98.6 F (37 C)  Recent Labs  Lab 09/12/18 0646 09/13/18 0734 09/15/18 0401 09/16/18 0416 09/18/18 0536  WBC 8.1 9.5 11.4*  --  13.7*  CREATININE 1.33* 1.33* 1.30* 1.32* 1.15    Estimated Creatinine Clearance: 60.6 mL/min (by C-G formula based on SCr of 1.15 mg/dL).    Allergies  Allergen Reactions  . Clindamycin/Lincomycin     Throat tightens up  . Contrast Media [Iodinated Diagnostic Agents] Shortness Of Breath    11/20/2004 reaction with CTA CAP  . Metformin And Related Other (See Comments)    Effected kidney function    Antimicrobials this admission: CTX 8/27 >> 8/29 Azithro 8/27 >> 9/4 Cefepime 8/29 >> 9/4 Mero 9/4 >>  Dose adjustments this admission:   Microbiology results: 8/27 COVID >> neg 8/27 RVP >> neg 8/27 BCx >> NGf 8/30 COVID >> neg 8/30 MRSA PCR >> neg 9/2 UCx >> 9/2 RCx >>  Thank you for allowing pharmacy to be a part of this patient's care.  Alycia Rossetti, PharmD, BCPS Clinical Pharmacist Clinical phone for 09/18/2018: [phone] 09/18/2018 1:50 PM   **Pharmacist phone directory can now be found on [email or web address] ([email or web address]).  Listed under [place].

## 2018-09-18 NOTE — Progress Notes (Signed)
Saw order for Bipap, asked patient about it.  Patient does not use a CPAP or Bipap at home.  O2 sats are 99% at this time, patient is in no distress.  Patient has refused Bipap but will continue to monitor patient.

## 2018-09-18 NOTE — Progress Notes (Signed)
Received PIV consult: Discussed plan with Regino Schultze, RN: Recommended she start transfusion after antibiotic is complete to preserve pt's veins.

## 2018-09-18 NOTE — Progress Notes (Signed)
MD Irene Pap notified about low BP 65/57 (61). Received orders to administer a 1000 bolus give at 1549. Give orders to cycle pressure.

## 2018-09-18 NOTE — Progress Notes (Signed)
Patient has severe hypotension with MAP in the 50's. 1L NS bolus and 50 g IV albumin ordered to be administered stat. Also ordered phenylephrine drip to titrate and maintain MAP>65. BP Q1H until stable.   Transferring patient to ICU, higher level of care, for pressors.

## 2018-09-18 NOTE — Progress Notes (Signed)
1000 Patient called out reporting SOB. Patient Saturations were low 80s to high 70s. This RN went in and assessed the patient. Patient was encourage to take in deep breaths through his nose. Patient was unable to recover and O2 remained in 80s. Increased his oxygen on nasal canula to Nonrebreather.  Patient's O2 saturations recovered to mid 90s. Patient reports feeling better and had some labored breathing. He remains Alert and Oriented x4 and following commands. Reports no pain.   [place] called to come to bedside. RT also called at this time to come assess patient.   37 MD Irene Pap paged and alerted about patient increased O2 demand.   [place] Irene Pap returned paged. She ordered to maintain saturations above 90s and that she will consult with PCCM about patient status.   Will continue to monitor and assess patient.

## 2018-09-18 NOTE — TOC Progression Note (Signed)
Transition of Care [facility]) - Progression Note    Patient Details  Name: Adrian Andrews MRN: [id number] Date of Birth: 03/14/1950  Transition of Care [facility]) CM/SW [place], [place] Phone Number: 09/18/2018, 8:01 PM  Clinical Narrative:   CSW attempted to meet with patient to further discuss SNF, but informed by RN of critical status today, not medically stable to continue to pursue discharge plans at this time. CSW will hold off on discussions until patient stabilizes.    Expected Discharge Plan: [place] Barriers to Discharge: Continued Medical Work up  Expected Discharge Plan and Services Expected Discharge Plan: [place] arrangements for the past 2 months: [place]: RN   Date [place]: 09/11/18 Time [place]: [id number] Representative spoke with at [place]: [place]) Interventions    Readmission Risk Interventions Readmission Risk Prevention Plan 09/11/2018  Transportation Screening Complete  HRI or Home Care Consult Complete  Some recent data might be hidden

## 2018-09-18 NOTE — Progress Notes (Signed)
SLP Cancellation Note  Patient Details Name: Adrian Andrews MRN: [id number] DOB: 01/08/1951   Cancelled treatment:       Reason Eval/Treat Not Completed: Medical issues which prohibited therapy. SLP had ordered MBS to be completed today in light of worsening CXR (R>L). RN called to report that he was no longer stable to leave the floor for this study due to worsening respiratory status requiring NRB. She said that she is also keeping him NPO. Will f/u for potential to complete MBS prior to resuming POs.    Adrian Andrews 09/18/2018, 1:02 PM  Pollyann Glen, M.A. [place] Acute Environmental education officer [phone] Office [phone]

## 2018-09-18 NOTE — Progress Notes (Signed)
Tried patient on HFNC, patient started to desat.  Even took patient all the way to 11L flow and patient sat was in upper 80s.  Placed patient back on venturi, did however decrease FIO2 to 45% on 10L flow.  Patient sat is currently 95%.  Will continue to try and wean patient down more, no distress noted at this time, will continue to monitor.

## 2018-09-18 NOTE — Progress Notes (Addendum)
NAME:  SARITH WESTLY, MRN:  [id number], DOB:  [birth year], LOS: 8 ADMISSION DATE:  09/10/2018, CONSULTATION DATE:  09/10/2018 REFERRING MD:  Lorin [facility], CHIEF COMPLAINT:  SOB   Brief History   68 yo M admitted for PNA. Borderline low BP, interval increase in Lactic acid. Admitted to hospitalist service. PCCM to consult   History of present illness   68 yo M PMH prior CVA, carotid stenosis s/p bilaterals stents, COPD on home 2-3LNC (followed by Dr. Gala Murdoch at [facility]), DM2, CKD III, HLD, chronic pain who presented to ED 8/27 due to fever and SOB. Patient reports that SOB began about 3 days ago, and patient has had associated yellow/green productive cough. Patient endorses associated Tmax 101F at home. Patient also endorses concomitant diarrhea x 3 days which has improved some with immodium, denies presence of blood in stool, and endorses poor PO intake x 1 week.   In ED, patient COVID-19 test results negative. Lactic acid increased to 4.0 from 2.8. Na 130, Glu 276, BUN 36 Cr 1.83.  The patient's CXR reveals lobar opacities suggestive of PNA. The patient was started on abx and fluids.   Past Medical History  COPD Carotid stenosis CVA HTN HLD DM2 Basal cell carcinoma CKD III Actinic keratosis Tobacco use disorder PVD Significant Hospital Events   8/27 admitted to hospitalist service. Started on ceftriaxone and azithromycin, IVF started   Consults:  PCCM  Procedures:    Significant Diagnostic Tests:  8/27 CXR > RLL opacity  8/30 CT Chest  IMPRESSION: 1. Significant infiltrate in the right middle and lower lobes worrisome for pneumonia. More mild infiltrate is seen in the posterior left upper lobe. Recommend short-term follow-up imaging to ensure resolution. 2. Atherosclerotic change in the thoracic aorta. Coronary artery calcifications. Emphysema. 3. Emphysema.  8/31 Echo IMPRESSIONS  1. The left ventricle has normal systolic function, with an ejection fraction of 55-60%.  The cavity size was normal. Left ventricular diastolic Doppler parameters are consistent with pseudonormalization. No evidence of left ventricular regional wall  motion abnormalities.  2. Mild calcification of the mitral valve leaflet. There is moderate mitral annular calcification present. No evidence of mitral valve stenosis. No significant mitral regurgitation.  3. The aortic valve is tricuspid. Mild calcification of the aortic valve. No stenosis of the aortic valve.  4. The aortic root is normal in size and structure.  5. The right ventricle has mildly reduced systolic function. The cavity was mildly enlarged. There is no increase in right ventricular wall thickness. Mildly D-shaped interventricular septum suggests a degree of RV pressure/volume overload.  6. The inferior vena cava was dilated in size with >50% respiratory variability. No complete TR doppler jet so unable to estimate PA systolic pressure.  9/4 CXR> worsening RM&LL infiltrates with small R pleural effusion     Micro Data:  8/27 SARS Cov2> negative  8/30 SARS Cov2 repeat> negative  8/27 BCx > NG final  9/2 Urine> NG final  8/30 MRSA surveillance> negative   Antimicrobials:  Azithromycin 8/27>> Ceftriaxone 8/27>>   Interim history/subjective:  Since our last evaluation, COVID has been retested due to worsening clinical presentation. Subsequent was negative. This morning patient has escalated from nasal cannula to high flow cannula and nonrebreather in the past few hours.  He is afebrile. He did have resolved leukocytosis, now up trending.  He does have normalized procalcitonin.  He denies shortness of breath, chest pain, difficulty breathing, fever, chills, nausea, vomiting, diarrhea, constipation.  States he looked over  at the monitor noted that his oxygen levels were in the 70s.  This was as he was moving himself in bed.  He has had cough with purulent sputum, yellow/brownish tinged that he is able to self suction.  Staff  escalated oxygen as noted above.  Objective   Blood pressure 98/65, pulse 98, temperature 98.5 F (36.9 C), temperature source Oral, resp. rate 14, height 5\' 9"  (1.753 m), weight 78.6 kg, SpO2 99 %.        Intake/Output Summary (Last 24 hours) at 09/18/2018 1113 Last data filed at 09/18/2018 0700 Gross per 24 hour  Intake 934 ml  Output 3850 ml  Net -2916 ml   Filed Weights   09/16/18 0333 09/17/18 0351 09/18/18 0500  Weight: 85.4 kg 80.8 kg 78.6 kg    Examination: General: Thin, well-developed, well nourished. NAD HENT: Normocephalic, PERRL. Moist mucus membranes Neck: No JVD. Trachea midline. No thyromegaly, no lymphadenopathy CV: RRR. S1S2. 1/6 M, no RG. +2 distal pulses Lungs: BBS diminished at bases with slight pleural friction rub right lower lobe, no wheezing or rhonchi, FNL, symmetrical ABD: +BS x4. SNT/ND. No masses, guarding or rigidity GU: No Foley EXT: MAE well. No edema Skin: PWD. In tact. No rashes or lesions Neuro: A&Ox3. CN II-XII in tact. No focal deficits Psych: Patient states he feels anxious and "I have butterflies in my stomach" otherwise appropriate mood, insight and judgment for time and situation     Resolved Hospital Problem list     Assessment & Plan:   Sepsis due to suspected cavitary PNA The patient's blood pressure had stabilized however he has had episodes of hypotension in the past 24 hours. This may be sepsis related versus symptomatic anemia .  He has had a slight up trend in leukocytosis, procalcitonin has normalized.  Afebrile for 24 hours.  See anemia discussion below..  Extensive review of imaging raises suspicion  that this is now a cavitary pneumonia.  Despite improved fever, leukocytosis and procalcitonin, the patient has been on 8 days of antibiotics and now has purulent sputum with worsening hypoxia.  See hypoxia discussion below. P Recommend broadening antibiotics to include double pseudomonal coverage, pharmacy consulted for Levaquin.   Would continue cefepime and azithromycin at this time.  Culture sputum, Legionella, strep urine antigens, blood cultures.  This was ordered. Continue bronchial hygiene.  The patient has been performing same.  Acute on chronic hypoxic respiratory failure on baseline 2 L/min oxygen with history of COPD -does not have clinical feature of COPD exacerbation at this time -CT of chest reviewed.  Was unable to obtain CTA at that time to rule out PE. -Echo did show very mild D-shaped of RV and mildly reduced systolic function.  There could be an element of right heart failure possibly due to underlying PE versus result of pneumonia. P Wean for goal SPO2 greater than or equal to 90% Treat pneumonia as noted above Continue albuterol PRN Duoneb q6hr Continue IS, flutter  Review unable to obtain CTA earlier on due to CKD though renal function is normal today.  Check d-dimer and lower extremity duplex to assess for DVT then may consider proceeding with CTA to rule out PE. He is on DVT prophylaxis Lovenox Confirmed with patient DNR/DNI  AKI on CKD III This appears stable his creatinine is 1.15, the best this hospitalization so far. P Pharmacy to dose abx as needed  Trend UOP via strict I/O    Anemia-symptomatic at this time.  Patient's had a fairly  significant drop in his hemoglobin.  There has been no melena, hematochezia, hematemesis or clear source of bleeding.  Primary is currently working this up. P Agree with transfusion though would probably consider only 1 unit versus 2 due to potential volume overload in the setting of slight RV overload or consider diuresis with same. Transfusion may also help with his oxygenation.  DM II P SSI   Hx HTN P Continue tele Antihypertensives on hold per primary  Hx CVA P Supportive care Continue lipitor, ASA   Chronic pain P Gabapentin, ultram  Recent Hx diarrhea.  None since our last evaluation. P Monitor  Depression P Zoloft  Goals of  Care DNR/DNI confirmed with patient.      Best practice:  Diet: Primary Pain/Anxiety/Delirium protocol (if indicated): gaba/ultram VAP protocol (if indicated): na DVT prophylaxis: lovenox  GI prophylaxis: na  Glucose control: SSI  Mobility: PT/OT  Code Status: DNR  Family Communication: patient updated.  Patient states he has no family and does not have anyone he wishes [place] to contact. Disposition: Patient may stay in progressive care.  Labs and imaging reviewed in EMR.    We will continue to follow with you.  Francine Graven, MSN, AGACNP  Pager [phone] or if no answer [phone] [facility]   Independently examined pt, evaluated data & formulated above care plan with NP   68 year old smoker with emphysema admitted 8/27 with severe CAP.  Improved and was down to 3 L nasal cannula but hypoxia worsened today suddenly requiring nonrebreather, Exam-no accessory muscle use, crackles at right base, no rhonchi, no JVD or edema Chest x-ray shows slight increase in airspace opacity right mid and lower lung and mild left basilar opacity  He does not appear fluid overloaded and in fact is 16 L negative, not sure whether worsening hypoxia represents mucous plugging episode or superimposed H CAP, leukocytosis is only mildly increased Echo does show D-shaped septum suggesting some degree of RV dysfunction, venous duplex was -8/30 and he has been on prophylactic Lovenox since admit DNR noted, will follow along with you  Echo Propp V. Elsworth Soho MD

## 2018-09-18 NOTE — Significant Event (Signed)
Rapid Response Event Note  Overview: Time Called: [id number] Arrival Time: [id number] Event Type: Respiratory  Initial Focused Assessment: Patient acutely hypoxic.  Desat 79% on [place].  RN placed pt on HFNC with minimal improvement.  Placed on NRB O2 sats improved to 96%.  Patient states that he is not short of breath. Upon my arrival IS being done pt on 15L HFNC  O2 sats 83%  Patient is alert and oriented mild increase WOB. BP 110/59  HR 96  RR 18-21  Temp 98.5 oral He is alert and oriented  Pt with DNR status  Interventions: ABG done  7.48/38/45/28 O2 sat 83% Placed on NRB O2 sat improved to 99% Patient became very anxious with the NRB mask on his face.  MD considered BiPap, pt refused.    Patient currently NPO awaiting swallow study  0.5mg  Ativan given for anxiety Remains on NRB mask    Plan of Care (if not transferred): CCM to bedside to assess patient:  D dimer and dopplers  PRBC  Addendum: BP 70-90s/40-50s  HR 80-90s RR 14-18  Weaned to 55% Venturi O2 sats 97% Plan ICU care Awaiting PRBC to be cross matched  Event Summary: Name of Physician Notified: [place] Crane at 1035    at       Event End Time: [id number]

## 2018-09-18 NOTE — Progress Notes (Addendum)
PROGRESS NOTE  Adrian Andrews [id number] DOB: February 01, 1950 DOA: 09/10/2018 PCP: [name], MD  HPI/Recap of past 71 hours: 68 year old male with history of CVA, carotid artery stenosis/stent, DM-2, CKD 3, HTN, HLD, COPD-3L and former smoker presenting with fever, productive cough, shortness of breath and diarrhea and admitted for sepsis due to RLL pneumonia, and AKI.  In ED, soft blood pressures.  Lactic acidosis.  CXR revealed RLL pneumonia.  COVID-19 negative. Started on ceftriaxone, azithromycin and a steroid.  PCCM consulted given soft blood pressure and uptrending lactic acidosis and recommended hydration.  Hospital course complicated by severe hypoxia requiring high flow nasal cannula.  CT chest done without contrast due to allergy showed no clear evidence of pulmonary embolism.  Revealed bilateral pulmonary infiltrates with increase in pulmonary vascularity.  Started on IV Lasix with improvement of hypoxia.  Elevated inflammatory markers with suspicion for COVID-19.  Repeated in-house COVID-19 negative.  2D echo done on 09/14/2018 unrevealing.  Showing normal LVEF 55 to 60%.   09/18/18: Patient was seen and examined at his bedside this morning.  Reports his breathing has slightly improved.  During this visit, oxygen saturation in the low 90s on 3 L by nasal cannula on the monitor in the room.  Later this morning, bedside RN reports increasing oxygen requirement on high flow nasal cannula 15 L to maintain oxygen saturations greater than 90%.  Discussed with PCCM, will see in consultation.  Significantly hypoxic with PaO2 on ABG 45.  Placed on nonrebreather.  Patient is DNR.   Assessment/Plan: Principal Problem:   Sepsis due to pneumonia [facility]) Active Problems:   Acute on chronic respiratory failure with hypoxia (HCC)   COPD (chronic obstructive pulmonary disease) (HCC)   Type 2 diabetes mellitus with other specified complication (HCC)   CKD (chronic kidney disease) stage 3,  GFR 30-59 ml/min (HCC)   RLL pneumonia (HCC)   Essential hypertension   Acute on chronic diastolic CHF (congestive heart failure) ([place])   Hyperbilirubinemia   Sepsis secondary to right lower lobe community-acquired pneumonia Presented with tachypnea and tachycardia with right lower lobe infiltrates on chest x-ray done on 09/10/2018.  Procalcitonin 7.44>> 5.25>> 2.19>> 0.47 on 09/18/18 Completed 7 days of IV azithromycin Currently on IV cefepime. Blood cultures negative x3 days MRSA negative Respiratory panel negative COVID-19 negative x2 during this admission   Worsening acute on chronic hypoxic respiratory failure suspect multifactorial secondary to CAP Vs suspected acute on chronic diastolic CHF vs B/L pleural effusions On 2 L of oxygen as needed at baseline Increase in oxygen requirement on 09/18/18 HFNC >> NRB Pulmonology reconsulted Personally reviewed chest x-ray done on 09/18/2018 which shows bilateral pleural effusion greater on the right with right lower lobe infiltrates. Continue bronchodilators Currently off Lasix and IV steroids PCCM consulted to further assess Start continuous O2 monitoring  B/L pleural effusions, R> L infective from PNA vs cardiogenic Pleural effusions noted on CXR 09/18/18 reviewed independently. Last 2D echo normal LVEF Off diuretic due to soft BP  Acute blood loss anemia, unclear etiology Hemoglobin dropped from 9.2 to 7.1 FOBT, iron studies ordered Rule out GI bleed 2U PRBCs ordered to be transfused Repeat H&H post transfusion.  Hypotension Continue to hold off atenolol Maintain map greater than 65  Community-acquired pneumonia, POA Management as stated above Mucinex for cough as needed Continue flutter valve  Elevated d-dimer D-dimer 11 on 09/13/2018 Negative for PE Negative DVT in lower extremities bilaterally COVID 19-x2 during this admission  Significantly elevated BNP with concern  for acute on chronic diastolic CHF BNP from [id number] on  02/13/2018 to greater than 2300 on 09/13/2018 2D echo done on 09/14/2018 showed normal LVEF 55 to 60%.  IV Lasix 40 mg daily Net I&O -8.1 L since admission Continue strict I's and O's and daily weight  Recurrent fevers, unclear etiology Afebrile in the last 24 hours Completed 7 days of IV azithromycin Currently on cefepime   Hypokalemia/hypophosphatemia/hypomagnesemia Repleted  Resolving isolated hyperbilirubinemia, unclear etiology Total bilirubin 2.3 on 09/15/18>>3.0 on 09/17/18 >>1.8 on 09/18/18 Alkaline phosphatase, AST and ALT normal  Resolving euvolemic hyponatremia Na+ 134 from 130  Continue to monitor Asymptomatic  Resolving AKI on CKD 2 Appears to be at his baseline creatinine 1.1 with GFR greater than 60 Presented with creatinine of 1.8 Continue to avoid nephrotoxins and hypotension Closely monitor urine output   Mild thrombocytopenia Presented with platelet count of 111 Platelet count is improving   Prior polysubstance abuse with benzodiazepine and cocaine Last UDS on 05/04/2016+ for benzodiazepine and cocaine No repeated UDS during this admission  Chronic anxiety/depression Continue Zoloft, trazodone, Neurontin  COPD Continue inhalers  Hypertension Blood pressure is currently soft Continue to hold off atenolol Continue IV diuretics due to pulmonary edema impairing respiratory stability Maintain map greater than 65 and continue to monitor vital signs closely  Type 2 diabetes with hyperglycemia Hemoglobin A1c 7.6 on 09/10/2018 Continue insulin sliding scale Continue Levemir Avoid hypoglycemia    Code Status: DNR  Family Communication: None at bedside  Disposition Plan: Undetermined at this time.  Plan to DC once respiratory status is close to baseline.   Consultants:  PCCM  Procedures:  None  Antimicrobials:  Cefepime   DVT prophylaxis: Subcu Lovenox daily.   Objective: Vitals:   09/18/18 1030 09/18/18 1032 09/18/18 1044 09/18/18 1048   BP:    (!) 110/59  Pulse: (!) 102 100 95 96  Resp: 19 19 15 13   Temp:    98.5 F (36.9 C)  TempSrc:    Oral  SpO2: (!) 84% (!) 87% 99% 100%  Weight:      Height:        Intake/Output Summary (Last 24 hours) at 09/18/2018 1054 Last data filed at 09/18/2018 0700 Gross per 24 hour  Intake 934 ml  Output 3850 ml  Net -2916 ml   Filed Weights   09/16/18 0333 09/17/18 0351 09/18/18 0500  Weight: 85.4 kg 80.8 kg 78.6 kg    Exam:  . General: 68 y.o. year-old male well-developed well-nourished.  Appears mildly uncomfortable this morning due to dyspnea.  Alert and oriented x3.  . Cardiovascular: Regular rate and rhythm no rubs or gallops.  No JVD or thyromegaly.   Marland Kitchen Respiratory: Rales noted at bases.  No wheezing noted.  Poor inspiratory effort..  . Abdomen: Obese nontender nondistended bowel sounds present. . Musculoskeletal: No lower extremity edema.  2 out of 4 pulses in all 4 extremities.   Marland Kitchen Psychiatry: Mood is appropriate for condition and setting.   Data Reviewed: CBC: Recent Labs  Lab 09/12/18 0646 09/13/18 0734 09/15/18 0401 09/18/18 0536  WBC 8.1 9.5 11.4* 13.7*  HGB 9.7* 8.9* 9.2* 7.1*  HCT 28.9* 27.3* 26.9* 21.0*  MCV 100.3* 100.4* 94.4 95.5  PLT 124* 136* 241 [id number]   Basic Metabolic Panel: Recent Labs  Lab 09/12/18 0646 09/13/18 0734 09/14/18 0647 09/15/18 0401 09/16/18 0416 09/18/18 0536  NA 134* 131*  --  130* 130* 134*  K 3.9 4.6  --  3.4* 3.5 3.7  CL 102 103  --  95* 94* 99  CO2 22 17*  --  25 27 28   GLUCOSE 196* 145*  --  194* 161* 141*  BUN 41* 40*  --  39* 33* 20  CREATININE 1.33* 1.33*  --  1.30* 1.32* 1.15  CALCIUM 8.6* 8.1*  --  8.0* 7.8* 7.8*  MG 2.0 1.9 2.1 1.8  --   --   PHOS  --   --  3.0 2.1*  --   --    GFR: Estimated Creatinine Clearance: 60.6 mL/min (by C-G formula based on SCr of 1.15 mg/dL). Liver Function Tests: Recent Labs  Lab 09/15/18 0401 09/16/18 0416 09/17/18 0618 09/18/18 0536  AST 26 17 14* 17  ALT 36 26 23 22    ALKPHOS 63 63 66 62  BILITOT 2.3* 3.1* 3.0* 1.8*  PROT 4.9* 4.7* 4.8* 4.6*  ALBUMIN 1.9* 1.6* 1.9* 1.8*   No results for input(s): LIPASE, AMYLASE in the last 168 hours. No results for input(s): AMMONIA in the last 168 hours. Coagulation Profile: No results for input(s): INR, PROTIME in the last 168 hours. Cardiac Enzymes: No results for input(s): CKTOTAL, CKMB, CKMBINDEX, TROPONINI in the last 168 hours. BNP (last 3 results) No results for input(s): PROBNP in the last 8760 hours. HbA1C: No results for input(s): HGBA1C in the last 72 hours. CBG: Recent Labs  Lab 09/17/18 1110 09/17/18 1552 09/17/18 2025 09/17/18 2121 09/18/18 0801  GLUCAP 180* 182* 155* 146* 146*   Lipid Profile: No results for input(s): CHOL, HDL, LDLCALC, TRIG, CHOLHDL, LDLDIRECT in the last 72 hours. Thyroid Function Tests: No results for input(s): TSH, T4TOTAL, FREET4, T3FREE, THYROIDAB in the last 72 hours. Anemia Panel: No results for input(s): VITAMINB12, FOLATE, FERRITIN, TIBC, IRON, RETICCTPCT in the last 72 hours. Urine analysis:    Component Value Date/Time   COLORURINE YELLOW 09/16/2018 [place] 09/16/2018 1715   LABSPEC 1.006 09/16/2018 1715   PHURINE 6.0 09/16/2018 1715   GLUCOSEU NEGATIVE 09/16/2018 1715   HGBUR MODERATE (A) 09/16/2018 1715   BILIRUBINUR NEGATIVE 09/16/2018 1715   BILIRUBINUR neg 09/10/2013 1308   KETONESUR NEGATIVE 09/16/2018 1715   PROTEINUR NEGATIVE 09/16/2018 1715   UROBILINOGEN 0.2 09/10/2013 1308   NITRITE NEGATIVE 09/16/2018 1715   LEUKOCYTESUR NEGATIVE 09/16/2018 1715   Sepsis Labs: @LABRCNTIP (procalcitonin:4,lacticidven:4)  ) Recent Results (from the past 240 hour(s))  SARS Coronavirus 2 [facility] order, Performed in [facility] hospital lab) Nasopharyngeal Nasopharyngeal Swab     Status: None   Collection Time: 09/10/18  9:12 AM   Specimen: Nasopharyngeal Swab  Result Value Ref Range Status   SARS Coronavirus 2 NEGATIVE NEGATIVE Final     Comment: (NOTE) If result is NEGATIVE SARS-CoV-2 target nucleic acids are NOT DETECTED. The SARS-CoV-2 RNA is generally detectable in upper and lower  respiratory specimens during the acute phase of infection. The lowest  concentration of SARS-CoV-2 viral copies this assay can detect is 250  copies / mL. A negative result does not preclude SARS-CoV-2 infection  and should not be used as the sole basis for treatment or other  patient management decisions.  A negative result may occur with  improper specimen collection / handling, submission of specimen other  than nasopharyngeal swab, presence of viral mutation(s) within the  areas targeted by this assay, and inadequate number of viral copies  (<250 copies / mL). A negative result must be combined with clinical  observations, patient history, and epidemiological information. If result is POSITIVE SARS-CoV-2  target nucleic acids are DETECTED. The SARS-CoV-2 RNA is generally detectable in upper and lower  respiratory specimens dur ing the acute phase of infection.  Positive  results are indicative of active infection with SARS-CoV-2.  Clinical  correlation with patient history and other diagnostic information is  necessary to determine patient infection status.  Positive results do  not rule out bacterial infection or co-infection with other viruses. If result is PRESUMPTIVE POSTIVE SARS-CoV-2 nucleic acids MAY BE PRESENT.   A presumptive positive result was obtained on the submitted specimen  and confirmed on repeat testing.  While 2019 novel coronavirus  (SARS-CoV-2) nucleic acids may be present in the submitted sample  additional confirmatory testing may be necessary for epidemiological  and / or clinical management purposes  to differentiate between  SARS-CoV-2 and other Sarbecovirus currently known to infect humans.  If clinically indicated additional testing with an alternate test  methodology [phone]) is advised. The  SARS-CoV-2 RNA is generally  detectable in upper and lower respiratory sp ecimens during the acute  phase of infection. The expected result is Negative. Fact Sheet for Patients:  StrictlyIdeas.no Fact Sheet for Healthcare Providers: BankingDealers.co.za This test is not yet approved or cleared by the [place] FDA and has been authorized for detection and/or diagnosis of SARS-CoV-2 by FDA under an Emergency Use Authorization (EUA).  This EUA will remain in effect (meaning this test can be used) for the duration of the COVID-19 declaration under Section 564(b)(1) of the Act, 21 U.S.C. section 360bbb-3(b)(1), unless the authorization is terminated or revoked sooner. Performed at [facility], [place] [address], [place], [place] [address]   Blood Culture (routine x 2)     Status: None   Collection Time: 09/10/18 10:04 AM   Specimen: BLOOD  Result Value Ref Range Status   Specimen Description BLOOD SITE NOT SPECIFIED  Final   Special Requests   Final    BOTTLES DRAWN AEROBIC AND ANAEROBIC Blood Culture adequate volume   Culture   Final    NO GROWTH 5 DAYS Performed at [facility], [address] [place], [place] [address]    Report Status 09/15/2018 FINAL  Final  Blood Culture (routine x 2)     Status: None   Collection Time: 09/10/18 10:10 AM   Specimen: BLOOD LEFT ARM  Result Value Ref Range Status   Specimen Description BLOOD LEFT ARM  Final   Special Requests   Final    BOTTLES DRAWN AEROBIC AND ANAEROBIC Blood Culture adequate volume   Culture   Final    NO GROWTH 5 DAYS Performed at [facility], [place] [address] [place], [place] [address]    Report Status 09/15/2018 FINAL  Final  Respiratory Panel by PCR     Status: None   Collection Time: 09/10/18  1:20 PM   Specimen: Nasopharyngeal Swab; Respiratory  Result Value Ref Range Status   Adenovirus NOT DETECTED NOT DETECTED Final   Coronavirus 229E NOT  DETECTED NOT DETECTED Final    Comment: (NOTE) The Coronavirus on the Respiratory Panel, DOES NOT test for the novel  Coronavirus (2019 nCoV)    Coronavirus HKU1 NOT DETECTED NOT DETECTED Final   Coronavirus NL63 NOT DETECTED NOT DETECTED Final   Coronavirus OC43 NOT DETECTED NOT DETECTED Final   Metapneumovirus NOT DETECTED NOT DETECTED Final   Rhinovirus / Enterovirus NOT DETECTED NOT DETECTED Final   Influenza A NOT DETECTED NOT DETECTED Final   Influenza B NOT DETECTED  NOT DETECTED Final   Parainfluenza Virus 1 NOT DETECTED NOT DETECTED Final   Parainfluenza Virus 2 NOT DETECTED NOT DETECTED Final   Parainfluenza Virus 3 NOT DETECTED NOT DETECTED Final   Parainfluenza Virus 4 NOT DETECTED NOT DETECTED Final   Respiratory Syncytial Virus NOT DETECTED NOT DETECTED Final   Bordetella pertussis NOT DETECTED NOT DETECTED Final   Chlamydophila pneumoniae NOT DETECTED NOT DETECTED Final   Mycoplasma pneumoniae NOT DETECTED NOT DETECTED Final    Comment: Performed at [facility], [place] [address] [place], [place] [address]  SARS Coronavirus 2 [facility] order, Performed in [facility] hospital lab)     Status: None   Collection Time: 09/13/18  3:43 PM  Result Value Ref Range Status   SARS Coronavirus 2 NEGATIVE NEGATIVE Final    Comment: (NOTE) If result is NEGATIVE SARS-CoV-2 target nucleic acids are NOT DETECTED. The SARS-CoV-2 RNA is generally detectable in upper and lower  respiratory specimens during the acute phase of infection. The lowest  concentration of SARS-CoV-2 viral copies this assay can detect is 250  copies / mL. A negative result does not preclude SARS-CoV-2 infection  and should not be used as the sole basis for treatment or other  patient management decisions.  A negative result may occur with  improper specimen collection / handling, submission of specimen other  than nasopharyngeal swab, presence of viral mutation(s) within the  areas targeted by this assay,  and inadequate number of viral copies  (<250 copies / mL). A negative result must be combined with clinical  observations, patient history, and epidemiological information. If result is POSITIVE SARS-CoV-2 target nucleic acids are DETECTED. The SARS-CoV-2 RNA is generally detectable in upper and lower  respiratory specimens dur ing the acute phase of infection.  Positive  results are indicative of active infection with SARS-CoV-2.  Clinical  correlation with patient history and other diagnostic information is  necessary to determine patient infection status.  Positive results do  not rule out bacterial infection or co-infection with other viruses. If result is PRESUMPTIVE POSTIVE SARS-CoV-2 nucleic acids MAY BE PRESENT.   A presumptive positive result was obtained on the submitted specimen  and confirmed on repeat testing.  While 2019 novel coronavirus  (SARS-CoV-2) nucleic acids may be present in the submitted sample  additional confirmatory testing may be necessary for epidemiological  and / or clinical management purposes  to differentiate between  SARS-CoV-2 and other Sarbecovirus currently known to infect humans.  If clinically indicated additional testing with an alternate test  methodology [phone]) is advised. The SARS-CoV-2 RNA is generally  detectable in upper and lower respiratory sp ecimens during the acute  phase of infection. The expected result is Negative. Fact Sheet for Patients:  StrictlyIdeas.no Fact Sheet for Healthcare Providers: BankingDealers.co.za This test is not yet approved or cleared by the [place] FDA and has been authorized for detection and/or diagnosis of SARS-CoV-2 by FDA under an Emergency Use Authorization (EUA).  This EUA will remain in effect (meaning this test can be used) for the duration of the COVID-19 declaration under Section 564(b)(1) of the Act, 21 U.S.C. section 360bbb-3(b)(1), unless  the authorization is terminated or revoked sooner. Performed at [facility], [place] [address], [place], [place] [address]   MRSA PCR Screening     Status: None   Collection Time: 09/13/18  6:11 PM   Specimen: Nasal Mucosa; Nasopharyngeal  Result Value Ref Range Status   MRSA by PCR NEGATIVE NEGATIVE  Final    Comment:        The GeneXpert MRSA Assay (FDA approved for NASAL specimens only), is one component of a comprehensive MRSA colonization surveillance program. It is not intended to diagnose MRSA infection nor to guide or monitor treatment for MRSA infections. Performed at [facility], [place] [address] [place], [place] [address]   Respiratory Panel by PCR     Status: None   Collection Time: 09/13/18  6:12 PM   Specimen: Nasopharyngeal Swab; Respiratory  Result Value Ref Range Status   Adenovirus NOT DETECTED NOT DETECTED Final   Coronavirus 229E NOT DETECTED NOT DETECTED Final    Comment: (NOTE) The Coronavirus on the Respiratory Panel, DOES NOT test for the novel  Coronavirus (2019 nCoV)    Coronavirus HKU1 NOT DETECTED NOT DETECTED Final   Coronavirus NL63 NOT DETECTED NOT DETECTED Final   Coronavirus OC43 NOT DETECTED NOT DETECTED Final   Metapneumovirus NOT DETECTED NOT DETECTED Final   Rhinovirus / Enterovirus NOT DETECTED NOT DETECTED Final   Influenza A NOT DETECTED NOT DETECTED Final   Influenza B NOT DETECTED NOT DETECTED Final   Parainfluenza Virus 1 NOT DETECTED NOT DETECTED Final   Parainfluenza Virus 2 NOT DETECTED NOT DETECTED Final   Parainfluenza Virus 3 NOT DETECTED NOT DETECTED Final   Parainfluenza Virus 4 NOT DETECTED NOT DETECTED Final   Respiratory Syncytial Virus NOT DETECTED NOT DETECTED Final   Bordetella pertussis NOT DETECTED NOT DETECTED Final   Chlamydophila pneumoniae NOT DETECTED NOT DETECTED Final   Mycoplasma pneumoniae NOT DETECTED NOT DETECTED Final    Comment: Performed at [facility], [place]. [address]  [place], [place] [address]  Culture, Urine     Status: None   Collection Time: 09/16/18  5:49 PM   Specimen: Urine, Clean Catch  Result Value Ref Range Status   Specimen Description URINE, CLEAN CATCH  Final   Special Requests NONE  Final   Culture   Final    NO GROWTH Performed at [facility], [place] [address] [place], [place] [address]    Report Status 09/17/2018 FINAL  Final      Studies: Dg Chest Port 1 View  Result Date: 09/18/2018 CLINICAL DATA:  Hypoxia. History of COPD. EXAM: PORTABLE CHEST 1 VIEW COMPARISON:  Chest radiograph and CT 09/13/2018 FINDINGS: The cardiomediastinal silhouette is unchanged. Airspace opacity in the right mid and lower lung is stable to slightly increased from the prior radiograph. There is a small right pleural effusion which was also shown on CT. Mild left basilar opacity has increased from the prior radiograph. No pneumothorax is identified. IMPRESSION: Slight worsening of right greater than left lung opacities consistent with pneumonia. Small right pleural effusion. Electronically Signed   By: Logan Bores M.D.   On: 09/18/2018 09:12    Scheduled Meds: . aspirin EC  81 mg Oral Daily  . atorvastatin  20 mg Oral QHS  . enoxaparin (LOVENOX) injection  40 mg Subcutaneous Daily  . fluticasone  2 spray Each Nare Daily  . gabapentin  300 mg Oral q morning - 10a  . gabapentin  600 mg Oral QHS  . guaiFENesin  600 mg Oral BID  . insulin aspart  0-15 Units Subcutaneous TID WC  . insulin aspart  0-5 Units Subcutaneous QHS  . insulin detemir  8 Units Subcutaneous BID  . ipratropium  0.5 mg Inhalation Q6H  . levalbuterol  0.63 mg Inhalation Q6H  . pantoprazole  40 mg Oral BID  . senna-docusate  1 tablet Oral QHS  . sertraline  50 mg Oral Daily  . traZODone  50 mg Oral QHS    Continuous Infusions: . ceFEPime (MAXIPIME) IV 2 g (09/18/18 0525)     LOS: 8 days     Kayleen Memos, MD [facility] Pager [phone]  If 7PM-7AM, please contact  night-coverage [email or web address] Password [email or web address] 09/18/2018, 10:54 AM

## 2018-09-19 ENCOUNTER — Inpatient Hospital Stay (HOSPITAL_COMMUNITY): Payer: Medicare Other

## 2018-09-19 DIAGNOSIS — Z888 Allergy status to other drugs, medicaments and biological substances status: Secondary | ICD-10-CM

## 2018-09-19 DIAGNOSIS — J181 Lobar pneumonia, unspecified organism: Secondary | ICD-10-CM

## 2018-09-19 DIAGNOSIS — Z9981 Dependence on supplemental oxygen: Secondary | ICD-10-CM

## 2018-09-19 DIAGNOSIS — E785 Hyperlipidemia, unspecified: Secondary | ICD-10-CM

## 2018-09-19 DIAGNOSIS — A419 Sepsis, unspecified organism: Secondary | ICD-10-CM

## 2018-09-19 DIAGNOSIS — E1122 Type 2 diabetes mellitus with diabetic chronic kidney disease: Secondary | ICD-10-CM

## 2018-09-19 DIAGNOSIS — Z881 Allergy status to other antibiotic agents status: Secondary | ICD-10-CM

## 2018-09-19 DIAGNOSIS — N183 Chronic kidney disease, stage 3 (moderate): Secondary | ICD-10-CM

## 2018-09-19 DIAGNOSIS — J9601 Acute respiratory failure with hypoxia: Secondary | ICD-10-CM

## 2018-09-19 DIAGNOSIS — F17211 Nicotine dependence, cigarettes, in remission: Secondary | ICD-10-CM

## 2018-09-19 DIAGNOSIS — Z91041 Radiographic dye allergy status: Secondary | ICD-10-CM

## 2018-09-19 DIAGNOSIS — R6521 Severe sepsis with septic shock: Secondary | ICD-10-CM

## 2018-09-19 DIAGNOSIS — Z8673 Personal history of transient ischemic attack (TIA), and cerebral infarction without residual deficits: Secondary | ICD-10-CM

## 2018-09-19 LAB — COMPREHENSIVE METABOLIC PANEL
ALT: 24 U/L (ref 0–44)
AST: 20 U/L (ref 15–41)
Albumin: 2.4 g/dL — ABNORMAL LOW (ref 3.5–5.0)
Alkaline Phosphatase: 67 U/L (ref 38–126)
Anion gap: 11 (ref 5–15)
BUN: 17 mg/dL (ref 8–23)
CO2: 26 mmol/L (ref 22–32)
Calcium: 8.1 mg/dL — ABNORMAL LOW (ref 8.9–10.3)
Chloride: 95 mmol/L — ABNORMAL LOW (ref 98–111)
Creatinine, Ser: 1.23 mg/dL (ref 0.61–1.24)
GFR calc Af Amer: >60 mL/min (ref >60)
GFR calc non Af Amer: 60 mL/min — ABNORMAL LOW (ref >60)
Glucose, Bld: 177 mg/dL — ABNORMAL HIGH (ref 70–99)
Potassium: 3.8 mmol/L (ref 3.5–5.1)
Sodium: 132 mmol/L — ABNORMAL LOW (ref 135–145)
Total Bilirubin: 2.1 mg/dL — ABNORMAL HIGH (ref 0.3–1.2)
Total Protein: 5.3 g/dL — ABNORMAL LOW (ref 6.5–8.1)

## 2018-09-19 LAB — CBC WITH DIFFERENTIAL/PLATELET
Abs Immature Granulocytes: 1.15 10*3/uL — ABNORMAL HIGH (ref 0.00–0.07)
Basophils Absolute: 0 10*3/uL (ref 0.0–0.1)
Basophils Relative: 0 %
Eosinophils Absolute: 0.2 10*3/uL (ref 0.0–0.5)
Eosinophils Relative: 1 %
HCT: 22.8 % — ABNORMAL LOW (ref 39.0–52.0)
Hemoglobin: 7.3 g/dL — ABNORMAL LOW (ref 13.0–17.0)
Immature Granulocytes: 7 %
Lymphocytes Relative: 11 %
Lymphs Abs: 1.7 10*3/uL (ref 0.7–4.0)
MCH: 32 pg (ref 26.0–34.0)
MCHC: 32 g/dL (ref 30.0–36.0)
MCV: 100 fL (ref 80.0–100.0)
Monocytes Absolute: 0.5 10*3/uL (ref 0.1–1.0)
Monocytes Relative: 3 %
Neutro Abs: 12.2 10*3/uL — ABNORMAL HIGH (ref 1.7–7.7)
Neutrophils Relative %: 78 %
Platelets: 358 10*3/uL (ref 150–400)
RBC: 2.28 MIL/uL — ABNORMAL LOW (ref 4.22–5.81)
RDW: 15.7 % — ABNORMAL HIGH (ref 11.5–15.5)
WBC: 12.9 10*3/uL — ABNORMAL HIGH (ref 4.0–10.5)
nRBC: 0.4 % — ABNORMAL HIGH (ref 0.0–0.2)

## 2018-09-19 LAB — GLUCOSE, CAPILLARY
Glucose-Capillary: 187 mg/dL — ABNORMAL HIGH (ref 70–99)
Glucose-Capillary: 221 mg/dL — ABNORMAL HIGH (ref 70–99)
Glucose-Capillary: 236 mg/dL — ABNORMAL HIGH (ref 70–99)
Glucose-Capillary: 190 mg/dL — ABNORMAL HIGH (ref 70–99)

## 2018-09-19 LAB — PHOSPHORUS: Phosphorus: 2.4 mg/dL — ABNORMAL LOW (ref 2.5–4.6)

## 2018-09-19 LAB — EXPECTORATED SPUTUM ASSESSMENT W GRAM STAIN, RFLX TO RESP C

## 2018-09-19 LAB — MAGNESIUM: Magnesium: 1.7 mg/dL (ref 1.7–2.4)

## 2018-09-19 MED ORDER — LEVALBUTEROL HCL 1.25 MG/0.5ML IN NEBU
1.2500 mg | INHALATION_SOLUTION | Freq: Three times a day (TID) | RESPIRATORY_TRACT | Status: DC
  Administered 2018-09-20 – 2018-09-28 (×24): 1.25 mg via RESPIRATORY_TRACT
  Filled 2018-09-19 (×26): qty 0.5

## 2018-09-19 MED ORDER — LIDOCAINE 5 % EX PTCH
1.0000 | MEDICATED_PATCH | CUTANEOUS | Status: DC
  Administered 2018-09-19: 1 via TRANSDERMAL
  Filled 2018-09-19 (×7): qty 1

## 2018-09-19 MED ORDER — IPRATROPIUM BROMIDE 0.02 % IN SOLN
0.5000 mg | Freq: Three times a day (TID) | RESPIRATORY_TRACT | Status: DC
  Administered 2018-09-20 – 2018-09-28 (×24): 0.5 mg via RESPIRATORY_TRACT
  Filled 2018-09-19 (×25): qty 2.5

## 2018-09-19 MED ORDER — SODIUM CHLORIDE 0.9 % IV SOLN
500.0000 mg | INTRAVENOUS | Status: AC
  Administered 2018-09-19 – 2018-09-23 (×5): 500 mg via INTRAVENOUS
  Filled 2018-09-19 (×6): qty 500

## 2018-09-19 MED ORDER — MAGNESIUM SULFATE IN D5W 1-5 GM/100ML-% IV SOLN
1.0000 g | Freq: Once | INTRAVENOUS | Status: AC
  Administered 2018-09-19: 1 g via INTRAVENOUS
  Filled 2018-09-19: qty 100

## 2018-09-19 MED ORDER — INSULIN DETEMIR 100 UNIT/ML ~~LOC~~ SOLN: 8.0000 [IU] | Freq: Two times a day (BID) | SUBCUTANEOUS | Status: DC

## 2018-09-19 NOTE — Progress Notes (Addendum)
PROGRESS NOTE  Adrian Andrews [id number] DOB: Sep 17, 1950 DOA: 09/10/2018 PCP: [name], MD  HPI/Recap of past 67 hours: 68 year old male with history of CVA, carotid artery stenosis/stent, DM-2, CKD 3, HTN, HLD, COPD-3L and former smoker presenting with fever, productive cough, shortness of breath and diarrhea and admitted for sepsis due to RLL pneumonia, and AKI.  In ED, soft blood pressures.  Lactic acidosis.  CXR revealed RLL pneumonia.  COVID-19 negative. Started on ceftriaxone, azithromycin and a steroid.  PCCM consulted given soft blood pressure and uptrending lactic acidosis and recommended hydration.  Hospital course complicated by severe hypoxia requiring high flow nasal cannula.  CT chest done without contrast due to allergy showed no clear evidence of pulmonary embolism.  Revealed bilateral pulmonary infiltrates with increase in pulmonary vascularity.  Started on IV Lasix with improvement of hypoxia.  Elevated inflammatory markers with suspicion for COVID-19.  Repeated in-house COVID-19 negative.  2D echo done on 09/14/2018 unrevealing.  Showing normal LVEF 55 to 60%.  Hospital course complicated by respiratory distress on 09/18/2018 and severe hypotension requiring pressors secondary to septic shock in the setting of cavitary pneumonia.  Transferred to ICU on 09/18/2018.  09/19/18: Patient was seen and examined at his bedside this morning.  He is on Venturi mask at 45% FiO2 with O2 saturation in the mid 90s.  Unable to wean off Ventimask overnight per RT.  Denies dyspnea at rest or chest pain.  Reports lower back pain.   Assessment/Plan: Principal Problem:   Severe sepsis (HCC) Active Problems:   Acute respiratory failure with hypoxia (HCC)   COPD (chronic obstructive pulmonary disease) (HCC)   Type 2 diabetes mellitus with other specified complication (HCC)   CKD (chronic kidney disease) stage 3, GFR 30-59 ml/min (HCC)   RLL pneumonia (HCC)   Essential hypertension   Acute on chronic diastolic CHF (congestive heart failure) ([place])   Hyperbilirubinemia   Septic shock secondary to cavitary community-acquired pneumonia  Presented with tachypnea and tachycardia with right lower lobe infiltrates on chest x-ray done on 09/10/2018.  Procalcitonin 7.44>> 5.25>> 2.19>> 0.47 on 09/18/18 COVID-19 negative x2 during this admission MRSA screening negative Completed 7 days of IV azithromycin and cefepime Blood cultures drawn on 09/10/2018 final negative  Urine culture drawn on 09/16/2018 final negative Repeat blood cultures peripherally x2 on 09/19/2018 Will get AFB Started on meropenem from 09/18/2018 Currently on pressors in the ICU, maintain map greater than 65  Worsening acute on chronic hypoxic respiratory failure suspect multifactorial secondary to cavitary CAP, acute on chronic diastolic CHF, right pleural effusion On 2 L of oxygen as needed at baseline Increase in oxygen requirement on 09/18/18 HFNC >> Venturi mask Pulmonology reconsulted on 09/18/2018 Personally reviewed chest x-ray done on 09/18/2018 which shows right pleural effusion and bibasilar infiltrates Continue bronchodilators Continue to maintain O2 saturation greater than 92% PCCM has been consulted to assist with the management  Right pleural effusion suspect infective from cavitary pneumonia  Last 2D echo normal LVEF Off diuretic due to soft BP Continue meropenem  Acute blood loss anemia, unclear etiology Hemoglobin dropped from 9.2 to 7.1 FOBT, iron studies ordered Rule out GI bleed 2U PRBCs ordered to be transfused Repeat H&H post transfusion.  Hypomagnesemia Magnesium 1.7 Replete as indicated  Hypotension Continue to hold off atenolol Maintain map greater than 65  Cavitary community-acquired pneumonia, POA Management as stated above Mucinex for cough as needed Pulmonary toilet as needed Continue flutter valve as tolerated AFB ordered, will consult ID.  Chronic  low back pain  Continue lidocaine patch  Elevated d-dimer D-dimer 11 on 09/13/2018>> 2.3 on 09/18/18 No evidence of PE on CT chest no contrast Negative DVT in lower extremities bilaterally COVID 19-x2 during this admission  Hypotensive in the setting of septic shock.  Home atenolol has been held  Significantly elevated BNP with concern for acute on chronic diastolic CHF BNP from [id number] on 02/13/2018 to greater than 2300 on 09/13/2018 2D echo done on 09/14/2018 showed normal LVEF 55 to 60%.  IV Lasix 40 mg daily Net I&O -17.9 L since admission Continue strict I's and O's and daily weight  Recurrent fevers, unclear etiology Afebrile in the last 48 hours Currently on meropenem for cavitary pneumonia  Hypokalemia/hypophosphatemia/hypomagnesemia Repleted  Resolving isolated hyperbilirubinemia, unclear etiology Total bilirubin 2.3 on 09/15/18>>3.0 on 09/17/18 >>1.8 on 09/18/18>> 2.1 on 09/19/18 Alkaline phosphatase, AST and ALT normal  Resolving euvolemic hyponatremia Na+ 134 >> 132 on 10/09/18 Continue to monitor  AKI on CKD 2 Baseline creatinine 1.1 with GFR greater than 60  Creatinine 1.23 on 09/19/2018 Continue to avoid nephrotoxins Continue to monitor urine output Repeat BMP in the morning  Resolved mild thrombocytopenia Presented with platelet count of 111 Platelet count 290 on 09/18/2018  Prior polysubstance abuse with benzodiazepine and cocaine Last UDS on 05/04/2016+ for benzodiazepine and cocaine No repeated UDS during this admission  Chronic anxiety/depression Resume Zoloft, trazodone, Neurontin when hemodynamically stable  COPD Continue inhalers  Type 2 diabetes with hyperglycemia Hemoglobin A1c 7.6 on 09/10/2018 Continue sensitive insulin sliding scale Hold Levemir while n.p.o.  Risks: Patient is at high risk for decompensation due to septic shock, severe acute on chronic hypoxic respiratory failure, multiple comorbidities and advanced age.  Patient was transferred to the ICU on 09/18/2018  due to worsening condition.     Code Status: DNR  Family Communication: None at bedside  Disposition Plan: Undetermined at this time.  Plan to DC once respiratory status is close to baseline.   Consultants:  PCCM  Infectious disease on 09/19/2018.  Procedures:  None  Antimicrobials:  Meropenem  DVT prophylaxis: Subcu Lovenox daily.   Objective: Vitals:   09/19/18 0754 09/19/18 0800 09/19/18 0830 09/19/18 0900  BP:  109/62 (!) 103/51 (!) 85/66  Pulse:  90 (!) 101 (!) 110  Resp:  16 13 16   Temp:   98.2 F (36.8 C)   TempSrc:   Oral   SpO2: 97% 97% 96% 93%  Weight:      Height:        Intake/Output Summary (Last 24 hours) at 09/19/2018 0919 Last data filed at 09/19/2018 0900 Gross per 24 hour  Intake 449.97 ml  Output 225 ml  Net 224.97 ml   Filed Weights   09/17/18 0351 09/18/18 0500 09/19/18 0500  Weight: 80.8 kg 78.6 kg 79 kg    Exam:  . General: 68 y.o. year-old male well-developed well-nourished.  Appears uncomfortable due to accessory muscle use.  He is alert and oriented x3. . Cardiovascular: Regular rate and rhythm no rubs or gallops.  No JVD or thyromegaly noted.   Marland Kitchen Respiratory: Diffuse rales bilaterally.  No wheezes noted.  Poor inspiratory effort.  On Venturi mask. . Abdomen: Nontender nondistended bowel sounds present. . Musculoskeletal: No lower extremity edema.  2 out of 4 pulses in all 4 extremities. Marland Kitchen Psychiatry: Mood is appropriate for condition and setting.    Data Reviewed: CBC: Recent Labs  Lab 09/13/18 0734 09/15/18 0401 09/18/18 0536 09/18/18 1136  WBC 9.5 11.4* 13.7*  --  HGB 8.9* 9.2* 7.1* 8.1*  HCT 27.3* 26.9* 21.0* 23.9*  MCV 100.4* 94.4 95.5  --   PLT 136* 241 290  --    Basic Metabolic Panel: Recent Labs  Lab 09/13/18 0734 09/14/18 0647 09/15/18 0401 09/16/18 0416 09/18/18 0536 09/19/18 0639  NA 131*  --  130* 130* 134* 132*  K 4.6  --  3.4* 3.5 3.7 3.8  CL 103  --  95* 94* 99 95*  CO2 17*  --  25 27 28  26   GLUCOSE 145*  --  194* 161* 141* 177*  BUN 40*  --  39* 33* 20 17  CREATININE 1.33*  --  1.30* 1.32* 1.15 1.23  CALCIUM 8.1*  --  8.0* 7.8* 7.8* 8.1*  MG 1.9 2.1 1.8  --   --  1.7  PHOS  --  3.0 2.1*  --   --  2.4*   GFR: Estimated Creatinine Clearance: 56.7 mL/min (by C-G formula based on SCr of 1.23 mg/dL). Liver Function Tests: Recent Labs  Lab 09/15/18 0401 09/16/18 0416 09/17/18 0618 09/18/18 0536 09/19/18 0639  AST 26 17 14* 17 20  ALT 36 26 23 22 24   ALKPHOS 63 63 66 62 67  BILITOT 2.3* 3.1* 3.0* 1.8* 2.1*  PROT 4.9* 4.7* 4.8* 4.6* 5.3*  ALBUMIN 1.9* 1.6* 1.9* 1.8* 2.4*   No results for input(s): LIPASE, AMYLASE in the last 168 hours. No results for input(s): AMMONIA in the last 168 hours. Coagulation Profile: No results for input(s): INR, PROTIME in the last 168 hours. Cardiac Enzymes: No results for input(s): CKTOTAL, CKMB, CKMBINDEX, TROPONINI in the last 168 hours. BNP (last 3 results) No results for input(s): PROBNP in the last 8760 hours. HbA1C: No results for input(s): HGBA1C in the last 72 hours. CBG: Recent Labs  Lab 09/18/18 0801 09/18/18 1142 09/18/18 1707 09/18/18 2157 09/19/18 0829  GLUCAP 146* 166* 110* 143* 187*   Lipid Profile: No results for input(s): CHOL, HDL, LDLCALC, TRIG, CHOLHDL, LDLDIRECT in the last 72 hours. Thyroid Function Tests: No results for input(s): TSH, T4TOTAL, FREET4, T3FREE, THYROIDAB in the last 72 hours. Anemia Panel: Recent Labs    09/18/18 1136  VITAMINB12 874  FERRITIN 629*  TIBC 258  IRON 97  RETICCTPCT 3.8*   Urine analysis:    Component Value Date/Time   COLORURINE YELLOW 09/16/2018 [place] 09/16/2018 1715   LABSPEC 1.006 09/16/2018 1715   PHURINE 6.0 09/16/2018 1715   GLUCOSEU NEGATIVE 09/16/2018 1715   HGBUR MODERATE (A) 09/16/2018 1715   BILIRUBINUR NEGATIVE 09/16/2018 1715   BILIRUBINUR neg 09/10/2013 1308   KETONESUR NEGATIVE 09/16/2018 1715   PROTEINUR NEGATIVE  09/16/2018 1715   UROBILINOGEN 0.2 09/10/2013 1308   NITRITE NEGATIVE 09/16/2018 1715   LEUKOCYTESUR NEGATIVE 09/16/2018 1715   Sepsis Labs: @LABRCNTIP (procalcitonin:4,lacticidven:4)  ) Recent Results (from the past 240 hour(s))  SARS Coronavirus 2 [facility] order, Performed in [facility] hospital lab) Nasopharyngeal Nasopharyngeal Swab     Status: None   Collection Time: 09/10/18  9:12 AM   Specimen: Nasopharyngeal Swab  Result Value Ref Range Status   SARS Coronavirus 2 NEGATIVE NEGATIVE Final    Comment: (NOTE) If result is NEGATIVE SARS-CoV-2 target nucleic acids are NOT DETECTED. The SARS-CoV-2 RNA is generally detectable in upper and lower  respiratory specimens during the acute phase of infection. The lowest  concentration of SARS-CoV-2 viral copies this assay can detect is 250  copies / mL. A negative result does not preclude  SARS-CoV-2 infection  and should not be used as the sole basis for treatment or other  patient management decisions.  A negative result may occur with  improper specimen collection / handling, submission of specimen other  than nasopharyngeal swab, presence of viral mutation(s) within the  areas targeted by this assay, and inadequate number of viral copies  (<250 copies / mL). A negative result must be combined with clinical  observations, patient history, and epidemiological information. If result is POSITIVE SARS-CoV-2 target nucleic acids are DETECTED. The SARS-CoV-2 RNA is generally detectable in upper and lower  respiratory specimens dur ing the acute phase of infection.  Positive  results are indicative of active infection with SARS-CoV-2.  Clinical  correlation with patient history and other diagnostic information is  necessary to determine patient infection status.  Positive results do  not rule out bacterial infection or co-infection with other viruses. If result is PRESUMPTIVE POSTIVE SARS-CoV-2 nucleic acids MAY BE PRESENT.   A  presumptive positive result was obtained on the submitted specimen  and confirmed on repeat testing.  While 2019 novel coronavirus  (SARS-CoV-2) nucleic acids may be present in the submitted sample  additional confirmatory testing may be necessary for epidemiological  and / or clinical management purposes  to differentiate between  SARS-CoV-2 and other Sarbecovirus currently known to infect humans.  If clinically indicated additional testing with an alternate test  methodology [phone]) is advised. The SARS-CoV-2 RNA is generally  detectable in upper and lower respiratory sp ecimens during the acute  phase of infection. The expected result is Negative. Fact Sheet for Patients:  StrictlyIdeas.no Fact Sheet for Healthcare Providers: BankingDealers.co.za This test is not yet approved or cleared by the [place] FDA and has been authorized for detection and/or diagnosis of SARS-CoV-2 by FDA under an Emergency Use Authorization (EUA).  This EUA will remain in effect (meaning this test can be used) for the duration of the COVID-19 declaration under Section 564(b)(1) of the Act, 21 U.S.C. section 360bbb-3(b)(1), unless the authorization is terminated or revoked sooner. Performed at [facility], [place] [address], [place], [place] [address]   Blood Culture (routine x 2)     Status: None   Collection Time: 09/10/18 10:04 AM   Specimen: BLOOD  Result Value Ref Range Status   Specimen Description BLOOD SITE NOT SPECIFIED  Final   Special Requests   Final    BOTTLES DRAWN AEROBIC AND ANAEROBIC Blood Culture adequate volume   Culture   Final    NO GROWTH 5 DAYS Performed at [facility], [address] [place], [place] [address]    Report Status 09/15/2018 FINAL  Final  Blood Culture (routine x 2)     Status: None   Collection Time: 09/10/18 10:10 AM   Specimen: BLOOD LEFT ARM  Result Value Ref Range Status   Specimen  Description BLOOD LEFT ARM  Final   Special Requests   Final    BOTTLES DRAWN AEROBIC AND ANAEROBIC Blood Culture adequate volume   Culture   Final    NO GROWTH 5 DAYS Performed at [facility], [place] [address], [place], [place] [address]    Report Status 09/15/2018 FINAL  Final  Respiratory Panel by PCR     Status: None   Collection Time: 09/10/18  1:20 PM   Specimen: Nasopharyngeal Swab; Respiratory  Result Value Ref Range Status   Adenovirus NOT DETECTED NOT DETECTED Final   Coronavirus 229E NOT DETECTED NOT  DETECTED Final    Comment: (NOTE) The Coronavirus on the Respiratory Panel, DOES NOT test for the novel  Coronavirus (2019 nCoV)    Coronavirus HKU1 NOT DETECTED NOT DETECTED Final   Coronavirus NL63 NOT DETECTED NOT DETECTED Final   Coronavirus OC43 NOT DETECTED NOT DETECTED Final   Metapneumovirus NOT DETECTED NOT DETECTED Final   Rhinovirus / Enterovirus NOT DETECTED NOT DETECTED Final   Influenza A NOT DETECTED NOT DETECTED Final   Influenza B NOT DETECTED NOT DETECTED Final   Parainfluenza Virus 1 NOT DETECTED NOT DETECTED Final   Parainfluenza Virus 2 NOT DETECTED NOT DETECTED Final   Parainfluenza Virus 3 NOT DETECTED NOT DETECTED Final   Parainfluenza Virus 4 NOT DETECTED NOT DETECTED Final   Respiratory Syncytial Virus NOT DETECTED NOT DETECTED Final   Bordetella pertussis NOT DETECTED NOT DETECTED Final   Chlamydophila pneumoniae NOT DETECTED NOT DETECTED Final   Mycoplasma pneumoniae NOT DETECTED NOT DETECTED Final    Comment: Performed at [facility], [place] [address] [place], [place] [address]  SARS Coronavirus 2 [facility] order, Performed in [facility] hospital lab)     Status: None   Collection Time: 09/13/18  3:43 PM  Result Value Ref Range Status   SARS Coronavirus 2 NEGATIVE NEGATIVE Final    Comment: (NOTE) If result is NEGATIVE SARS-CoV-2 target nucleic acids are NOT DETECTED. The SARS-CoV-2 RNA is generally detectable in upper and lower   respiratory specimens during the acute phase of infection. The lowest  concentration of SARS-CoV-2 viral copies this assay can detect is 250  copies / mL. A negative result does not preclude SARS-CoV-2 infection  and should not be used as the sole basis for treatment or other  patient management decisions.  A negative result may occur with  improper specimen collection / handling, submission of specimen other  than nasopharyngeal swab, presence of viral mutation(s) within the  areas targeted by this assay, and inadequate number of viral copies  (<250 copies / mL). A negative result must be combined with clinical  observations, patient history, and epidemiological information. If result is POSITIVE SARS-CoV-2 target nucleic acids are DETECTED. The SARS-CoV-2 RNA is generally detectable in upper and lower  respiratory specimens dur ing the acute phase of infection.  Positive  results are indicative of active infection with SARS-CoV-2.  Clinical  correlation with patient history and other diagnostic information is  necessary to determine patient infection status.  Positive results do  not rule out bacterial infection or co-infection with other viruses. If result is PRESUMPTIVE POSTIVE SARS-CoV-2 nucleic acids MAY BE PRESENT.   A presumptive positive result was obtained on the submitted specimen  and confirmed on repeat testing.  While 2019 novel coronavirus  (SARS-CoV-2) nucleic acids may be present in the submitted sample  additional confirmatory testing may be necessary for epidemiological  and / or clinical management purposes  to differentiate between  SARS-CoV-2 and other Sarbecovirus currently known to infect humans.  If clinically indicated additional testing with an alternate test  methodology [phone]) is advised. The SARS-CoV-2 RNA is generally  detectable in upper and lower respiratory sp ecimens during the acute  phase of infection. The expected result is Negative. Fact  Sheet for Patients:  StrictlyIdeas.no Fact Sheet for Healthcare Providers: BankingDealers.co.za This test is not yet approved or cleared by the [place] FDA and has been authorized for detection and/or diagnosis of SARS-CoV-2 by FDA under an Emergency Use Authorization (EUA).  This EUA will remain  in effect (meaning this test can be used) for the duration of the COVID-19 declaration under Section 564(b)(1) of the Act, 21 U.S.C. section 360bbb-3(b)(1), unless the authorization is terminated or revoked sooner. Performed at [facility], [place] [address] [place], [place] [address]   MRSA PCR Screening     Status: None   Collection Time: 09/13/18  6:11 PM   Specimen: Nasal Mucosa; Nasopharyngeal  Result Value Ref Range Status   MRSA by PCR NEGATIVE NEGATIVE Final    Comment:        The GeneXpert MRSA Assay (FDA approved for NASAL specimens only), is one component of a comprehensive MRSA colonization surveillance program. It is not intended to diagnose MRSA infection nor to guide or monitor treatment for MRSA infections. Performed at [facility], [place] [address] [place], [place] [address]   Respiratory Panel by PCR     Status: None   Collection Time: 09/13/18  6:12 PM   Specimen: Nasopharyngeal Swab; Respiratory  Result Value Ref Range Status   Adenovirus NOT DETECTED NOT DETECTED Final   Coronavirus 229E NOT DETECTED NOT DETECTED Final    Comment: (NOTE) The Coronavirus on the Respiratory Panel, DOES NOT test for the novel  Coronavirus (2019 nCoV)    Coronavirus HKU1 NOT DETECTED NOT DETECTED Final   Coronavirus NL63 NOT DETECTED NOT DETECTED Final   Coronavirus OC43 NOT DETECTED NOT DETECTED Final   Metapneumovirus NOT DETECTED NOT DETECTED Final   Rhinovirus / Enterovirus NOT DETECTED NOT DETECTED Final   Influenza A NOT DETECTED NOT DETECTED Final   Influenza B NOT DETECTED NOT DETECTED Final   Parainfluenza  Virus 1 NOT DETECTED NOT DETECTED Final   Parainfluenza Virus 2 NOT DETECTED NOT DETECTED Final   Parainfluenza Virus 3 NOT DETECTED NOT DETECTED Final   Parainfluenza Virus 4 NOT DETECTED NOT DETECTED Final   Respiratory Syncytial Virus NOT DETECTED NOT DETECTED Final   Bordetella pertussis NOT DETECTED NOT DETECTED Final   Chlamydophila pneumoniae NOT DETECTED NOT DETECTED Final   Mycoplasma pneumoniae NOT DETECTED NOT DETECTED Final    Comment: Performed at [facility], [place]. [address], [place], [place] [address]  Culture, Urine     Status: None   Collection Time: 09/16/18  5:49 PM   Specimen: Urine, Clean Catch  Result Value Ref Range Status   Specimen Description URINE, CLEAN CATCH  Final   Special Requests NONE  Final   Culture   Final    NO GROWTH Performed at [facility], [place] [address] [place], [place] [address]    Report Status 09/17/2018 FINAL  Final      Studies: No results found.  Scheduled Meds: . aspirin EC  81 mg Oral Daily  . atorvastatin  20 mg Oral QHS  . Chlorhexidine Gluconate Cloth  6 each Topical Daily  . enoxaparin (LOVENOX) injection  40 mg Subcutaneous Daily  . fluticasone  2 spray Each Nare Daily  . gabapentin  300 mg Oral q morning - 10a  . gabapentin  600 mg Oral QHS  . guaiFENesin  600 mg Oral BID  . insulin aspart  0-15 Units Subcutaneous TID WC  . insulin aspart  0-5 Units Subcutaneous QHS  . [START ON 09/22/2018] insulin detemir  8 Units Subcutaneous BID  . ipratropium  0.5 mg Inhalation Q6H  . levalbuterol  0.63 mg Inhalation Q6H  . lidocaine  1 patch Transdermal Q24H  . pantoprazole  40 mg Oral BID  .  senna-docusate  1 tablet Oral QHS  . sertraline  50 mg Oral Daily  . traZODone  50 mg Oral QHS    Continuous Infusions: . magnesium sulfate bolus IVPB    . meropenem (MERREM) IV Stopped (09/18/18 1659)  . phenylephrine (NEO-SYNEPHRINE) Adult infusion 10 mcg/min (09/19/18 0900)     LOS: 9 days     Kayleen Memos, MD  [facility] Pager [phone]  If 7PM-7AM, please contact night-coverage [email or web address] Password [email or web address] 09/19/2018, 9:19 AM

## 2018-09-19 NOTE — Progress Notes (Addendum)
NAME:  Adrian Andrews, MRN:  [id number], DOB:  1950/09/29, LOS: 9 ADMISSION DATE:  09/10/2018, CONSULTATION DATE:  09/10/2018 REFERRING MD:  Lorin [facility], CHIEF COMPLAINT:  SOB   Brief History   68 yo M admitted8/27 with complaints of SOB, fever & diarrhea.  Work up consistent with PNA. Borderline low BP, interval increase in Lactic acid. Admitted to hospitalist service. PCCM to consult   Past Medical History  COPD - 2L O2 Carotid stenosis CVA HTN HLD DM2 Basal cell carcinoma CKD III Actinic keratosis Tobacco use disorder PVD  Significant Hospital Events   8/27 Admitted with PNA. Started on ceftriaxone and azithromycin, IVF started  9/04 Increased O2 needs, started on neo for hypotension  Consults:  PCCM  Procedures:    Significant Diagnostic Tests:  8/27  CXR >> RLL opacity 8/30 CT Chest >> RML, RLL significant infiltrate, mild infiltrate in posterior left upper lobe, atherosclerotic change in the thoracic aorta, emphysema 8/30 LE Venous Duplex >> negative  8/31 ECHO >> LVEF 55-60%, RV mildly reduced systolic function, D-shaped interventricular septum suggests a degree of RV pressure/volume overload  9/4 CXR >> worsening infiltrates with small R pleural effusion  Micro Data:  COVID 8/27 >> negative  COVID 8/30 >> negative  BCx2 8/27 >> negative  UC 9/2 >> negative  Antimicrobials:  Azithromycin 8/27 >> 9/4 Ceftriaxone 8/27 >> 9/4 Meropenem 9/4 >>   Interim history/subjective:  PT reports feeling better since yesterday.  Notes low back pain, asking for tramadol.  Denies SOB.  Remains on 30 mcg neosynephrine.   Objective   Blood pressure (!) 78/60, pulse 88, temperature 97.6 F (36.4 C), temperature source Axillary, resp. rate 14, height 5\' 9"  (1.753 m), weight 79 kg, SpO2 96 %.    FiO2 (%):  [45 %-55 %] 45 %   Intake/Output Summary (Last 24 hours) at 09/19/2018 0742 Last data filed at 09/19/2018 0100 Gross per 24 hour  Intake 223.61 ml  Output -  Net 223.61  ml   Filed Weights   09/17/18 0351 09/18/18 0500 09/19/18 0500  Weight: 80.8 kg 78.6 kg 79 kg    Examination: General: elderly adult male lying in bed in NAD HEENT: MM pink/moist, nebulizer treatment in process, partial left ear amputation Neuro: AAOx4, speech clear, MAE CV: s1s2 rrr, SEM 3/6 heard best over 2nd ICS RSB PULM: even/non-labored, lungs bilaterally clear anterior GI: soft, bsx4 active  Extremities: warm/dry, no edema  Skin: no rashes or lesions  Resolved Hospital Problem list     Assessment & Plan:   Sepsis Shock -in setting of severe CAP  -some element of medication effect superimposed on acute illness (chronic pain meds) P: Wean neosynephrine for MAP >60, normal UOP  ICU monitoring  Meropenem as above   Acute on Chronic Hypoxic Respiratory Failure  -baseline 2L O2 dependent COPD  P: Wean O2 for sats > 90% Pulmonary hygiene - IS, mobilize  Treat underlying PNA  Unable to rule out PE on CTA due to CKD, mild elevation of D-dimer, LE venous duplex negative DNR / DNI   AKI on CKD III Hypomagnesemia  P: Trend BMP / urinary output Replace electrolytes as indicated Avoid nephrotoxic agents, ensure adequate renal perfusion Renal adjustment of medications  Anemia  -transfused 9/4.There has been no melena, hematochezia, hematemesis or clear source of bleeding.  Primary is currently working this up. P: Trend CBC  Transfuse per guidelines   DM II P: SSI   Hx HTN P: ICU monitoring  Hold antihypertensives   Hx CVA P: Supportive care  Continue lipitor, ASA  Chronic pain P: Gabapentin, ultram   Recent Hx Diarrhea -none since prior evaluation  P: Monitor / supportive care   Depression P: Zoloft   Goals of Care P: DNI / DNR   Best practice:  Diet: Primary Pain/Anxiety/Delirium protocol (if indicated): gaba/ultram VAP protocol (if indicated): na DVT prophylaxis: lovenox  GI prophylaxis: na  Glucose control: SSI  Mobility: PT/OT   Code Status: DNR  Family Communication: Patient updated on plan of care. No family / he does not have anyone he wishes to contact.  Disposition: ICU monitoring   LABS   Recent Labs  Lab 09/13/18 0734 09/15/18 0401 09/18/18 0536 09/18/18 1136  HGB 8.9* 9.2* 7.1* 8.1*  HCT 27.3* 26.9* 21.0* 23.9*  WBC 9.5 11.4* 13.7*  --   PLT 136* 241 290  --    Recent Labs  Lab 09/13/18 0734 09/14/18 0647 09/15/18 0401 09/16/18 0416 09/18/18 0536 09/19/18 0639  NA 131*  --  130* 130* 134* 132*  K 4.6  --  3.4* 3.5 3.7 3.8  CL 103  --  95* 94* 99 95*  CO2 17*  --  25 27 28 26   GLUCOSE 145*  --  194* 161* 141* 177*  BUN 40*  --  39* 33* 20 17  CREATININE 1.33*  --  1.30* 1.32* 1.15 1.23  CALCIUM 8.1*  --  8.0* 7.8* 7.8* 8.1*  MG 1.9 2.1 1.8  --   --  1.7  PHOS  --  3.0 2.1*  --   --  2.4*    CRITICAL CARE Performed by: Noe Gens   Total critical care time: 30 minutes  Critical care time was exclusive of separately billable procedures and treating other patients.  Critical care was necessary to treat or prevent imminent or life-threatening deterioration.  Critical care was time spent personally by me on the following activities: development of treatment plan with patient and/or surrogate as well as nursing, discussions with consultants, evaluation of patient's response to treatment, examination of patient, obtaining history from patient or surrogate, ordering and performing treatments and interventions, ordering and review of laboratory studies, ordering and review of radiographic studies, pulse oximetry and re-evaluation of patient's condition.   Noe Gens, NP-C  Pulmonary & Critical Care Pgr: [phone] or if no answer [phone] 09/19/2018, 8:20 AM

## 2018-09-19 NOTE — Consult Note (Signed)
[facility] for Infectious Disease  Total days of antibiotics 10        Day 1 meropenem               Reason for Consult: worsening pneumonia    Referring Physician: hall  Principal Problem:   Severe sepsis ([place]) Active Problems:   Acute respiratory failure with hypoxia ([place])   COPD (chronic obstructive pulmonary disease) ([place])   Type 2 diabetes mellitus with other specified complication (HCC)   CKD (chronic kidney disease) stage 3, GFR 30-59 ml/min (HCC)   RLL pneumonia (HCC)   Essential hypertension   Acute on chronic diastolic CHF (congestive heart failure) ([place])   Hyperbilirubinemia   Septic shock (HCC)    HPI: Adrian Andrews is a 68 y.o. male with history of CVA, COPD with 2-3 L [place], DM2, CKD3, HLD admitted on 8/27 with SOB and fever, productive cough with CXR findings in addn to diarrhea. He was ruled out for covid,  treated for CAP with ceftriaxone plus azithromycin, but continued to decompensate on 8/4 requiring increase supplemental oxygen. LA improved, however over last 3 days having decrease in Hgb/Hct. Latest CXR showing right >left infiltrate.day 10 abtx, since worsening oxygen requirement he was transitioned to meropenem from cefepime/azithro. Sputum cx pending and ur legionella pending. Supplemental O2 down to 1L, though appears to still have some element of tachypnea .  Past Medical History:  Diagnosis Date  . Anxiety   . Arthritis    back, right hip  . Chronic kidney disease    stage 3  . COPD (chronic obstructive pulmonary disease) ([place])    on 3L home O2  . Diabetes mellitus without complication ([place])   . Dyspnea   . Heart murmur   . Hyperlipidemia   . Hypertension   . Neuropathy   . Peripheral vascular disease ([place])    Carotid artery stenosis, s/p bilat stents  . Skin cancer    basal cell right cheek, left ear  . Stroke [facility])    noted on MRI    Allergies:  Allergies  Allergen Reactions  . Clindamycin/Lincomycin     Throat tightens up  .  Contrast Media [Iodinated Diagnostic Agents] Shortness Of Breath    11/20/2004 reaction with CTA CAP  . Metformin And Related Other (See Comments)    Effected kidney function      MEDICATIONS: . aspirin EC  81 mg Oral Daily  . atorvastatin  20 mg Oral QHS  . Chlorhexidine Gluconate Cloth  6 each Topical Daily  . enoxaparin (LOVENOX) injection  40 mg Subcutaneous Daily  . fluticasone  2 spray Each Nare Daily  . gabapentin  300 mg Oral q morning - 10a  . gabapentin  600 mg Oral QHS  . guaiFENesin  600 mg Oral BID  . insulin aspart  0-15 Units Subcutaneous TID WC  . insulin aspart  0-5 Units Subcutaneous QHS  . [START ON 09/22/2018] insulin detemir  8 Units Subcutaneous BID  . ipratropium  0.5 mg Inhalation Q6H  . levalbuterol  0.63 mg Inhalation Q6H  . lidocaine  1 patch Transdermal Q24H  . pantoprazole  40 mg Oral BID  . senna-docusate  1 tablet Oral QHS  . sertraline  50 mg Oral Daily  . traZODone  50 mg Oral QHS    Social History   Tobacco Use  . Smoking status: Former Smoker    Packs/day: 2.00    Years: 60.00    Pack years:  120.00    Types: Cigarettes    Quit date: 04/2018    Years since quitting: 0.4  . Smokeless tobacco: Never Used  Substance Use Topics  . Alcohol use: No    Comment: heavy drinker in past, none for 20 years  . Drug use: No    Family hx: cad, and DM  Review of Systems  Constitutional: Negative for fever, chills, diaphoresis, activity change, appetite change, fatigue and unexpected weight change.  HENT: Negative for congestion, sore throat, rhinorrhea, sneezing, trouble swallowing and sinus pressure.  Eyes: Negative for photophobia and visual disturbance.  Respiratory: + sob with occ wheezing. Negative for cough, chest tightness,  and stridor.  Cardiovascular: . Negative for chest pain, palpitations and leg swelling.  Gastrointestinal: Negative for nausea, vomiting, abdominal pain, diarrhea, constipation, blood in stool, abdominal distention and  anal bleeding.  Genitourinary: Negative for dysuria, hematuria, flank pain and difficulty urinating.  Musculoskeletal: Negative for myalgias, back pain, joint swelling, arthralgias and gait problem.  Skin: Negative for color change, pallor, rash and wound.  Neurological: Negative for dizziness, tremors, weakness and light-headedness.  Hematological: Negative for adenopathy. Does not bruise/bleed easily.  Psychiatric/Behavioral: Negative for behavioral problems, confusion, sleep disturbance, dysphoric mood, decreased concentration and agitation.     OBJECTIVE: Temp:  [97.6 F (36.4 C)-98.5 F (36.9 C)] 98.2 F (36.8 C) (09/05 0830) Pulse Rate:  [79-110] 89 (09/05 1100) Resp:  [11-20] 16 (09/05 1100) BP: (65-119)/(40-71) 107/61 (09/05 1100) SpO2:  [88 %-100 %] 97 % (09/05 1100) FiO2 (%):  [45 %-55 %] 45 % (09/05 0800) Weight:  [79 kg] 79 kg (09/05 0500) Physical Exam  Constitutional: He is oriented to person, place, and time. He appears well-developed and well-nourished. No distress.  HENT:  Mouth/Throat: Oropharynx is clear and moist. No oropharyngeal exudate.  Cardiovascular: Normal rate, regular rhythm and normal heart sounds. Exam reveals no gallop and no friction rub.  No murmur heard.  Pulmonary/Chest: Effort normal and breath sounds normal. No respiratory distress.mild rhonchi Abdominal: Soft. Bowel sounds are normal. He exhibits no distension. There is no tenderness.  Lymphadenopathy:  He has no cervical adenopathy.  Neurological: He is alert and oriented to person, place, and time.  Skin: Skin is warm and dry. No rash noted. No erythema.  Psychiatric: He has a normal mood and affect. His behavior is normal.     LABS: Results for orders placed or performed during the hospital encounter of 09/10/18 (from the past 48 hour(s))  Glucose, capillary     Status: Abnormal   Collection Time: 09/17/18  3:52 PM  Result Value Ref Range   Glucose-Capillary 182 (H) 70 - 99 mg/dL   Glucose, capillary     Status: Abnormal   Collection Time: 09/17/18  8:25 PM  Result Value Ref Range   Glucose-Capillary 155 (H) 70 - 99 mg/dL  Glucose, capillary     Status: Abnormal   Collection Time: 09/17/18  9:21 PM  Result Value Ref Range   Glucose-Capillary 146 (H) 70 - 99 mg/dL  Procalcitonin - Baseline     Status: None   Collection Time: 09/18/18  5:36 AM  Result Value Ref Range   Procalcitonin 0.47 ng/mL    Comment:        Interpretation: PCT (Procalcitonin) <= 0.5 ng/mL: Systemic infection (sepsis) is not likely. Local bacterial infection is possible. (NOTE)       Sepsis PCT Algorithm           Lower Respiratory Tract  Infection PCT Algorithm    ----------------------------     ----------------------------         PCT < 0.25 ng/mL                PCT < 0.10 ng/mL         Strongly encourage             Strongly discourage   discontinuation of antibiotics    initiation of antibiotics    ----------------------------     -----------------------------       PCT 0.25 - 0.50 ng/mL            PCT 0.10 - 0.25 ng/mL               OR       >80% decrease in PCT            Discourage initiation of                                            antibiotics      Encourage discontinuation           of antibiotics    ----------------------------     -----------------------------         PCT >= 0.50 ng/mL              PCT 0.26 - 0.50 ng/mL               AND        <80% decrease in PCT             Encourage initiation of                                             antibiotics       Encourage continuation           of antibiotics    ----------------------------     -----------------------------        PCT >= 0.50 ng/mL                  PCT > 0.50 ng/mL               AND         increase in PCT                  Strongly encourage                                      initiation of antibiotics    Strongly encourage escalation           of antibiotics                                      -----------------------------                                           PCT <= 0.25 ng/mL  OR                                        > 80% decrease in PCT                                     Discontinue / Do not initiate                                             antibiotics Performed at [facility], [place] [address] [place], [place] [address]   CBC     Status: Abnormal   Collection Time: 09/18/18  5:36 AM  Result Value Ref Range   WBC 13.7 (H) 4.0 - 10.5 K/uL   RBC 2.20 (L) 4.22 - 5.81 MIL/uL    Comment: CORRECTED FOR COLD AGGLUTININS   Hemoglobin 7.1 (L) 13.0 - 17.0 g/dL   HCT 21.0 (L) 39.0 - 52.0 %   MCV 95.5 80.0 - 100.0 fL    Comment: CORRECTED FOR COLD AGGLUTININS   MCH 32.3 26.0 - 34.0 pg   MCHC 33.8 30.0 - 36.0 g/dL    Comment: CORRECTED FOR COLD AGGLUTININS   RDW 14.9 11.5 - 15.5 %   Platelets 290 150 - 400 K/uL   nRBC 0.4 (H) 0.0 - 0.2 %    Comment: Performed at [facility], [place] [address] [place],  [address]  Comprehensive metabolic panel     Status: Abnormal   Collection Time: 09/18/18  5:36 AM  Result Value Ref Range   Sodium 134 (L) 135 - 145 mmol/L   Potassium 3.7 3.5 - 5.1 mmol/L   Chloride 99 98 - 111 mmol/L   CO2 28 22 - 32 mmol/L   Glucose, Bld 141 (H) 70 - 99 mg/dL   BUN 20 8 - 23 mg/dL   Creatinine, Ser 1.15 0.61 - 1.24 mg/dL   Calcium 7.8 (L) 8.9 - 10.3 mg/dL   Total Protein 4.6 (L) 6.5 - 8.1 g/dL   Albumin 1.8 (L) 3.5 - 5.0 g/dL   AST 17 15 - 41 U/L   ALT 22 0 - 44 U/L   Alkaline Phosphatase 62 38 - 126 U/L   Total Bilirubin 1.8 (H) 0.3 - 1.2 mg/dL   GFR calc non Af Amer >60 >60 mL/min   GFR calc Af Amer >60 >60 mL/min   Anion gap 7 5 - 15    Comment: Performed at [facility], [place] [address] [place], [place] [address]  Glucose, capillary     Status: Abnormal   Collection Time: 09/18/18  8:01 AM  Result Value Ref Range    Glucose-Capillary 146 (H) 70 - 99 mg/dL  Blood gas, arterial     Status: Abnormal   Collection Time: 09/18/18 11:00 AM  Result Value Ref Range   O2 Content 15.0 L/min   Delivery systems HI FLOW NASAL CANNULA    pH, Arterial 7.487 (H) 7.350 - 7.450   pCO2 arterial 37.8 32.0 - 48.0 mmHg   pO2, Arterial 45.0 (L) 83.0 - 108.0 mmHg   Bicarbonate 28.3 (H) 20.0 - 28.0 mmol/L   Acid-Base Excess 4.9 (H) 0.0 - 2.0 mmol/L  O2 Saturation 82.5 %   Patient temperature 98.6    Collection site LEFT RADIAL    Drawn by [phone]    Sample type ARTERIAL DRAW    Allens test (pass/fail) PASS PASS  Type and screen [facility] [place]     Status: None (Preliminary result)   Collection Time: 09/18/18 11:35 AM  Result Value Ref Range   ABO/RH(D) A POS    Antibody Screen POS    Sample Expiration 09/21/2018,2359    DAT, IgG NEG    Antibody Identification      NON SPECIFIC COLD ANTIBODY Performed at [facility], [address], [place], [place] [address]    Unit Number [id number]    Blood Component Type RED CELLS,LR    Unit division 00    Status of Unit ALLOCATED    Transfusion Status OK TO TRANSFUSE    Crossmatch Result COMPATIBLE    Unit Number [id number]    Blood Component Type RBC LR PHER1    Unit division 00    Status of Unit ALLOCATED    Transfusion Status OK TO TRANSFUSE    Crossmatch Result COMPATIBLE   Hemoglobin and hematocrit, blood     Status: Abnormal   Collection Time: 09/18/18 11:36 AM  Result Value Ref Range   Hemoglobin 8.1 (L) 13.0 - 17.0 g/dL   HCT 23.9 (L) 39.0 - 52.0 %    Comment: Performed at [facility], [place] [address], [place], [place] [address]  Iron and TIBC     Status: None   Collection Time: 09/18/18 11:36 AM  Result Value Ref Range   Iron 97 45 - 182 ug/dL   TIBC 258 250 - 450 ug/dL   Saturation Ratios 38 17.9 - 39.5 %   UIBC 161 ug/dL    Comment: Performed at [facility], [place] [address] [place], [place] [address]  Ferritin      Status: Abnormal   Collection Time: 09/18/18 11:36 AM  Result Value Ref Range   Ferritin 629 (H) 24 - 336 ng/mL    Comment: Performed at [facility], [place] [address] [place], [place] [address]  Vitamin B12     Status: None   Collection Time: 09/18/18 11:36 AM  Result Value Ref Range   Vitamin B-12 874 180 - 914 pg/mL    Comment: (NOTE) This assay is not validated for testing neonatal or myeloproliferative syndrome specimens for Vitamin B12 levels. Performed at [facility], [place] [address] [place], [place] [address]   Reticulocytes     Status: Abnormal   Collection Time: 09/18/18 11:36 AM  Result Value Ref Range   Retic Ct Pct 3.8 (H) 0.4 - 3.1 %   RBC. 2.43 (L) 4.22 - 5.81 MIL/uL    Comment: CORRECTED FOR COLD AGGLUTININS   Retic Count, Absolute 93.1 19.0 - 186.0 K/uL    Comment: CORRECTED FOR COLD AGGLUTININS.   Immature Retic Fract 40.5 (H) 2.3 - 15.9 %    Comment: Performed at [facility], [place] [address] [place], [place] [address]  Prepare RBC     Status: None   Collection Time: 09/18/18 11:36 AM  Result Value Ref Range   Order Confirmation      ORDER PROCESSED BY BLOOD BANK Performed at [facility], [place] [address], [place], [place] [address]   Glucose, capillary     Status: Abnormal   Collection Time: 09/18/18 11:42 AM  Result Value  Ref Range   Glucose-Capillary 166 (H) 70 - 99 mg/dL  D-dimer, quantitative (not at [facility])     Status: Abnormal   Collection Time: 09/18/18  2:03 PM  Result Value Ref Range   D-Dimer, Quant 2.36 (H) 0.00 - 0.50 ug/mL-FEU    Comment: (NOTE) At the manufacturer cut-off of 0.50 ug/mL FEU, this assay has been documented to exclude PE with a sensitivity and negative predictive value of 97 to 99%.  At this time, this assay has not been approved by the FDA to exclude DVT/VTE. Results should be correlated with clinical presentation. Performed at [facility], [place] [address], [place], [place] [address]    Glucose, capillary     Status: Abnormal   Collection Time: 09/18/18  5:07 PM  Result Value Ref Range   Glucose-Capillary 110 (H) 70 - 99 mg/dL  Strep pneumoniae urinary antigen     Status: None   Collection Time: 09/18/18  5:11 PM  Result Value Ref Range   Strep Pneumo Urinary Antigen NEGATIVE NEGATIVE    Comment:        Infection due to S. pneumoniae cannot be absolutely ruled out since the antigen present may be below the detection limit of the test. Performed at [facility], [place] [address], [place], [place] [address]   Glucose, capillary     Status: Abnormal   Collection Time: 09/18/18  9:57 PM  Result Value Ref Range   Glucose-Capillary 143 (H) 70 - 99 mg/dL  Comprehensive metabolic panel     Status: Abnormal   Collection Time: 09/19/18  6:39 AM  Result Value Ref Range   Sodium 132 (L) 135 - 145 mmol/L   Potassium 3.8 3.5 - 5.1 mmol/L   Chloride 95 (L) 98 - 111 mmol/L   CO2 26 22 - 32 mmol/L   Glucose, Bld 177 (H) 70 - 99 mg/dL   BUN 17 8 - 23 mg/dL   Creatinine, Ser 1.23 0.61 - 1.24 mg/dL   Calcium 8.1 (L) 8.9 - 10.3 mg/dL   Total Protein 5.3 (L) 6.5 - 8.1 g/dL   Albumin 2.4 (L) 3.5 - 5.0 g/dL   AST 20 15 - 41 U/L   ALT 24 0 - 44 U/L   Alkaline Phosphatase 67 38 - 126 U/L   Total Bilirubin 2.1 (H) 0.3 - 1.2 mg/dL   GFR calc non Af Amer 60 (L) >60 mL/min   GFR calc Af Amer >60 >60 mL/min   Anion gap 11 5 - 15    Comment: Performed at [facility], [place] [address] [place], [place] [address]  CBC with Differential/Platelet     Status: Abnormal   Collection Time: 09/19/18  6:39 AM  Result Value Ref Range   WBC 12.9 (H) 4.0 - 10.5 K/uL    Comment: REPEATED TO VERIFY   RBC 2.28 (L) 4.22 - 5.81 MIL/uL    Comment: CORRECTED FOR COLD AGGLUTININS   Hemoglobin 7.3 (L) 13.0 - 17.0 g/dL   HCT 22.8 (L) 39.0 - 52.0 %    Comment: CORRECTED FOR COLD AGGLUTININS   MCV 100.0 80.0 - 100.0 fL   MCH 32.0 26.0 - 34.0 pg   MCHC 32.0 30.0 - 36.0 g/dL    Comment:  CORRECTED FOR COLD AGGLUTININS   RDW 15.7 (H) 11.5 - 15.5 %   Platelets 358 150 - 400 K/uL    Comment: REPEATED TO VERIFY   nRBC 0.4 (H) 0.0 - 0.2 %  Neutrophils Relative % 78 %   Neutro Abs 12.2 (H) 1.7 - 7.7 K/uL   Lymphocytes Relative 11 %   Lymphs Abs 1.7 0.7 - 4.0 K/uL   Monocytes Relative 3 %   Monocytes Absolute 0.5 0.1 - 1.0 K/uL   Eosinophils Relative 1 %   Eosinophils Absolute 0.2 0.0 - 0.5 K/uL   Basophils Relative 0 %   Basophils Absolute 0.0 0.0 - 0.1 K/uL   Immature Granulocytes 7 %   Abs Immature Granulocytes 1.15 (H) 0.00 - 0.07 K/uL    Comment: Performed at [facility] [address], [place], [place] [address]  Magnesium     Status: None   Collection Time: 09/19/18  6:39 AM  Result Value Ref Range   Magnesium 1.7 1.7 - 2.4 mg/dL    Comment: Performed at [facility] [address], [place], [place] [address]  Phosphorus     Status: Abnormal   Collection Time: 09/19/18  6:39 AM  Result Value Ref Range   Phosphorus 2.4 (L) 2.5 - 4.6 mg/dL    Comment: Performed at [facility] [address], [place], [place] [address]  Glucose, capillary     Status: Abnormal   Collection Time: 09/19/18  8:29 AM  Result Value Ref Range   Glucose-Capillary 187 (H) 70 - 99 mg/dL    MICRO:  IMAGING: Dg Chest Port 1 View  Result Date: 09/18/2018 CLINICAL DATA:  Hypoxia. History of COPD. EXAM: PORTABLE CHEST 1 VIEW COMPARISON:  Chest radiograph and CT 09/13/2018 FINDINGS: The cardiomediastinal silhouette is unchanged. Airspace opacity in the right mid and lower lung is stable to slightly increased from the prior radiograph. There is a small right pleural effusion which was also shown on CT. Mild left basilar opacity has increased from the prior radiograph. No pneumothorax is identified. IMPRESSION: Slight worsening of right greater than left lung opacities consistent with pneumonia. Small right pleural effusion. Electronically Signed   By: Logan Bores M.D.   On:  09/18/2018 09:12    Assessment/Plan:  Respiratory distress with worsening pneumonia,possibly due to aspiration/pneumonitis but now appears improved with less oxygen needs  - urine legionella is pending - Continue on azithromycin until urine legionella returns - await sputum cx results to decide how to narrow regimen

## 2018-09-20 ENCOUNTER — Inpatient Hospital Stay (HOSPITAL_COMMUNITY): Payer: Medicare Other

## 2018-09-20 LAB — GLUCOSE, CAPILLARY
Glucose-Capillary: 199 mg/dL — ABNORMAL HIGH (ref 70–99)
Glucose-Capillary: 196 mg/dL — ABNORMAL HIGH (ref 70–99)
Glucose-Capillary: 203 mg/dL — ABNORMAL HIGH (ref 70–99)
Glucose-Capillary: 219 mg/dL — ABNORMAL HIGH (ref 70–99)

## 2018-09-20 LAB — BASIC METABOLIC PANEL
Anion gap: 8 (ref 5–15)
BUN: 12 mg/dL (ref 8–23)
CO2: 27 mmol/L (ref 22–32)
Calcium: 8.2 mg/dL — ABNORMAL LOW (ref 8.9–10.3)
Chloride: 98 mmol/L (ref 98–111)
Creatinine, Ser: 1.11 mg/dL (ref 0.61–1.24)
GFR calc Af Amer: >60 mL/min (ref >60)
GFR calc non Af Amer: >60 mL/min (ref >60)
Glucose, Bld: 196 mg/dL — ABNORMAL HIGH (ref 70–99)
Potassium: 3.8 mmol/L (ref 3.5–5.1)
Sodium: 133 mmol/L — ABNORMAL LOW (ref 135–145)

## 2018-09-20 LAB — CBC
HCT: 23.4 % — ABNORMAL LOW (ref 39.0–52.0)
Hemoglobin: 7.8 g/dL — ABNORMAL LOW (ref 13.0–17.0)
MCH: 33.2 pg (ref 26.0–34.0)
MCHC: 33.3 g/dL (ref 30.0–36.0)
MCV: 99.6 fL (ref 80.0–100.0)
Platelets: 327 10*3/uL (ref 150–400)
RBC: 2.35 MIL/uL — ABNORMAL LOW (ref 4.22–5.81)
RDW: 16.7 % — ABNORMAL HIGH (ref 11.5–15.5)
WBC: 10.7 10*3/uL — ABNORMAL HIGH (ref 4.0–10.5)
nRBC: 0.4 % — ABNORMAL HIGH (ref 0.0–0.2)

## 2018-09-20 MED ORDER — INSULIN DETEMIR 100 UNIT/ML ~~LOC~~ SOLN
8.0000 [IU] | Freq: Two times a day (BID) | SUBCUTANEOUS | Status: DC
  Administered 2018-09-20 – 2018-09-27 (×16): 8 [IU] via SUBCUTANEOUS
  Filled 2018-09-20 (×19): qty 0.08

## 2018-09-20 NOTE — Progress Notes (Signed)
Received call on IP call phone regarding question about isolation for patient. Reviewed chart with RN. Called ID on call - patient should be on standard precautions.

## 2018-09-20 NOTE — Progress Notes (Signed)
PROGRESS NOTE  Adrian Andrews [id number] DOB: 1950/08/06 DOA: 09/10/2018 PCP: [name], MD  HPI/Recap of past 71 hours: 68 year old male with history of CVA, carotid artery stenosis/stent, DM-2, CKD 3, HTN, HLD, COPD-3L and former smoker presenting with fever, productive cough, shortness of breath and diarrhea and admitted for sepsis due to RLL pneumonia, and AKI.  In ED, soft blood pressures.  Lactic acidosis.  CXR revealed RLL pneumonia.  COVID-19 negative. Started on ceftriaxone, azithromycin and a steroid.  PCCM consulted given soft blood pressure and uptrending lactic acidosis and recommended hydration.  Hospital course complicated by severe hypoxia requiring high flow nasal cannula.  CT chest done without contrast due to allergy showed no clear evidence of pulmonary embolism.  Revealed bilateral pulmonary infiltrates with increase in pulmonary vascularity.  Started on IV Lasix with improvement of hypoxia.  Elevated inflammatory markers with suspicion for COVID-19.  Repeated in-house COVID-19 negative.  2D echo done on 09/14/2018 unrevealing.  Showing normal LVEF 55 to 60%.  Hospital course complicated by respiratory distress on 09/18/2018 and severe hypotension requiring pressors secondary to septic shock in the setting of cavitary pneumonia.  Transferred to ICU on 09/18/2018.  09/20/18: Patient was seen and examined at his bedside this morning.  He is much improved.  He has no new complaints.  He denies chest pain, dyspnea at rest or palpitation.  Wean off Ventimask to 1 L with saturation in the low to mid 90s.  Maintaining map greater than 65 without pressors.  Will transfer to medical telemetry unit.   Assessment/Plan: Principal Problem:   Severe sepsis (HCC) Active Problems:   Acute respiratory failure with hypoxia (HCC)   COPD (chronic obstructive pulmonary disease) (HCC)   Type 2 diabetes mellitus with other specified complication (HCC)   CKD (chronic kidney disease)  stage 3, GFR 30-59 ml/min (HCC)   RLL pneumonia (HCC)   Essential hypertension   Acute on chronic diastolic CHF (congestive heart failure) ([facility])   Hyperbilirubinemia   Septic shock (HCC)   Resolving septic shock secondary to cavitary community-acquired pneumonia  Presented with tachypnea and tachycardia with right lower lobe infiltrates on chest x-ray done on 09/10/2018.  Procalcitonin 7.44>> 5.25>> 2.19>> 0.47 on 09/18/18 COVID-19 negative x2 during this admission MRSA screening negative Completed 7 days of IVcefepime Blood cultures drawn on 09/10/2018 final negative  Urine culture drawn on 09/16/2018 final negative Repeat blood cultures peripherally x2 on 09/19/2018 AFB sent Sputum culture reintubated for better growth, Gram stain showing rare gram-positive cocci and rare yeast Started on meropenem from 09/18/2018 with pressors IV azithromycin re-added on 09/19/2018 Seen by infectious disease, following. Off pressors since 09/19/2018 Maintaining map greater than 65 without pressors Transfer out of the unit to medical telemetry and continue to closely monitor  Worsening acute on chronic hypoxic respiratory failure suspect multifactorial secondary to cavitary CAP, acute on chronic diastolic CHF, right pleural effusion, ?aspiration On 2 L of oxygen as needed at baseline Increase in oxygen requirement on 09/18/18 HFNC >> Venturi mask Pulmonology reconsulted on 09/18/2018 Personally reviewed chest x-ray done on 09/18/2018 which shows right pleural effusion and bibasilar infiltrates ?aspiration, seen by speech therapy  Continue bronchodilators Continue to maintain O2 saturation greater than 92% PCCM has been consulted to assist with the management Personally reviewed chest x-ray done on 09/20/2018 which showed right mid and right lower lobe infiltrate, and small right pleural effusion.  Right pleural effusion suspect infective from cavitary pneumonia  Last 2D echo normal LVEF Off diuretic due to  soft BP  Continue meropenem and IV azithromycin Personally reviewed chest x-ray done on 09/20/2018, right mid, right lower lobe infiltrates with small right pleural effusion  Suspected dysphagia Seen by speech therapy with recommendations Aspiration precautions  Acute blood loss anemia, unclear etiology Hemoglobin dropped from 9.2 to 7.1> 7.8 FOBT, iron studies ordered Rule out GI bleed 2U PRBCs ordered to be transfused Repeat H&H post transfusion.  Hypomagnesemia Magnesium 1.7 Replete as indicated  Resolving hypotension No longer requiring pressors Maintain map greater than 65  Cavitary community-acquired pneumonia, POA Management as stated above Mucinex for cough as needed Pulmonary toilet as needed Continue flutter valve as tolerated AFB ordered, seen by infectious disease.  Chronic low back pain Continue lidocaine patch  Elevated d-dimer D-dimer 11 on 09/13/2018>> 2.3 on 09/18/18 No evidence of PE on CT chest no contrast Negative DVT in lower extremities bilaterally COVID 19-x2 during this admission  Hypotensive in the setting of septic shock.  Home atenolol has been held  Significantly elevated BNP with concern for acute on chronic diastolic CHF BNP from [id number] on 02/13/2018 to greater than 2300 on 09/13/2018 2D echo done on 09/14/2018 showed normal LVEF 55 to 60%.  IV Lasix 40 mg daily Net I&O -17.9 L since admission Continue strict I's and O's and daily weight  Resolved recurrent fevers, unclear etiology  Hypokalemia/hypophosphatemia/hypomagnesemia Replete as indicated  Resolving isolated hyperbilirubinemia, unclear etiology Total bilirubin 2.3 on 09/15/18>>3.0 on 09/17/18 >>1.8 on 09/18/18>> 2.1 on 09/19/18 Alkaline phosphatase, AST and ALT normal  Resolving euvolemic hyponatremia Na+ 134 >> 132 on 09/19/18>> 133 on 09/20/2018. Continue to monitor  AKI on CKD 2 Baseline creatinine 1.1 with GFR greater than 60  Creatinine at baseline 1.1 GFR greater than 60 on 09/20/2018.  Continue to avoid nephrotoxins and continue to monitor urine output  Resolved mild thrombocytopenia Presented with platelet count of 111 Platelet count 290 on 09/18/2018  Prior polysubstance abuse with benzodiazepine and cocaine Last UDS on 05/04/2016+ for benzodiazepine and cocaine No repeated UDS during this admission  Chronic anxiety/depression Resume Zoloft, trazodone, Neurontin when hemodynamically stable  COPD Continue inhalers  Type 2 diabetes with hyperglycemia Hemoglobin A1c 7.6 on 09/10/2018 Resume Levemir and continue insulin sliding scale Avoid hypoglycemia    Code Status: DNR  Family Communication: None at bedside  Disposition Plan: Undetermined at this time.  Plan to DC once respiratory status is close to baseline.   Consultants:  PCCM  Infectious disease on 09/19/2018.  Procedures:  None  Antimicrobials:  Meropenem  DVT prophylaxis: Subcu Lovenox daily.   Objective: Vitals:   09/20/18 0900 09/20/18 1000 09/20/18 1100 09/20/18 1200  BP: (!) 93/56 125/71 117/67   Pulse: 90 (!) 105 (!) 102   Resp: 16 15 16    Temp:    98.4 F (36.9 C)  TempSrc:    Axillary  SpO2: 99% 95% 96%   Weight:      Height:        Intake/Output Summary (Last 24 hours) at 09/20/2018 1321 Last data filed at 09/20/2018 1317 Gross per 24 hour  Intake 1159.79 ml  Output 3075 ml  Net -1915.21 ml   Filed Weights   09/18/18 0500 09/19/18 0500 09/20/18 0500  Weight: 78.6 kg 79 kg 74.5 kg    Exam:  . General: 68 y.o. year-old male frail-appearing no acute distress.  Alert and oriented x4. . Cardiovascular: Regular rate and rhythm no rubs or gallops.  No JVD or thyromegaly noted.   Marland Kitchen Respiratory: Faint rales at the  right base.  No wheezing noted.  Poor inspiratory effort. . Abdomen: Nontender nondistended normal bowel sounds present. . Musculoskeletal: No lower extremity edema.  2 out of 4 pulses in all 4 extremities.   Marland Kitchen Psychiatry: Mood is appropriate for condition and  setting.    Data Reviewed: CBC: Recent Labs  Lab 09/15/18 0401 09/18/18 0536 09/18/18 1136 09/19/18 0639 09/20/18 0416  WBC 11.4* 13.7*  --  12.9* 10.7*  NEUTROABS  --   --   --  12.2*  --   HGB 9.2* 7.1* 8.1* 7.3* 7.8*  HCT 26.9* 21.0* 23.9* 22.8* 23.4*  MCV 94.4 95.5  --  100.0 99.6  PLT 241 290  --  358 [id number]   Basic Metabolic Panel: Recent Labs  Lab 09/14/18 0647 09/15/18 0401 09/16/18 0416 09/18/18 0536 09/19/18 0639 09/20/18 0416  NA  --  130* 130* 134* 132* 133*  K  --  3.4* 3.5 3.7 3.8 3.8  CL  --  95* 94* 99 95* 98  CO2  --  25 27 28 26 27   GLUCOSE  --  194* 161* 141* 177* 196*  BUN  --  39* 33* 20 17 12   CREATININE  --  1.30* 1.32* 1.15 1.23 1.11  CALCIUM  --  8.0* 7.8* 7.8* 8.1* 8.2*  MG 2.1 1.8  --   --  1.7  --   PHOS 3.0 2.1*  --   --  2.4*  --    GFR: Estimated Creatinine Clearance: 62.8 mL/min (by C-G formula based on SCr of 1.11 mg/dL). Liver Function Tests: Recent Labs  Lab 09/15/18 0401 09/16/18 0416 09/17/18 0618 09/18/18 0536 09/19/18 0639  AST 26 17 14* 17 20  ALT 36 26 23 22 24   ALKPHOS 63 63 66 62 67  BILITOT 2.3* 3.1* 3.0* 1.8* 2.1*  PROT 4.9* 4.7* 4.8* 4.6* 5.3*  ALBUMIN 1.9* 1.6* 1.9* 1.8* 2.4*   No results for input(s): LIPASE, AMYLASE in the last 168 hours. No results for input(s): AMMONIA in the last 168 hours. Coagulation Profile: No results for input(s): INR, PROTIME in the last 168 hours. Cardiac Enzymes: No results for input(s): CKTOTAL, CKMB, CKMBINDEX, TROPONINI in the last 168 hours. BNP (last 3 results) No results for input(s): PROBNP in the last 8760 hours. HbA1C: No results for input(s): HGBA1C in the last 72 hours. CBG: Recent Labs  Lab 09/19/18 1305 09/19/18 1655 09/19/18 2142 09/20/18 0745 09/20/18 1147  GLUCAP 221* 236* 190* 199* 196*   Lipid Profile: No results for input(s): CHOL, HDL, LDLCALC, TRIG, CHOLHDL, LDLDIRECT in the last 72 hours. Thyroid Function Tests: No results for input(s): TSH,  T4TOTAL, FREET4, T3FREE, THYROIDAB in the last 72 hours. Anemia Panel: Recent Labs    09/18/18 1136  VITAMINB12 874  FERRITIN 629*  TIBC 258  IRON 97  RETICCTPCT 3.8*   Urine analysis:    Component Value Date/Time   COLORURINE YELLOW 09/16/2018 [place] 09/16/2018 1715   LABSPEC 1.006 09/16/2018 1715   PHURINE 6.0 09/16/2018 1715   GLUCOSEU NEGATIVE 09/16/2018 1715   HGBUR MODERATE (A) 09/16/2018 1715   BILIRUBINUR NEGATIVE 09/16/2018 1715   BILIRUBINUR neg 09/10/2013 1308   KETONESUR NEGATIVE 09/16/2018 1715   PROTEINUR NEGATIVE 09/16/2018 1715   UROBILINOGEN 0.2 09/10/2013 1308   NITRITE NEGATIVE 09/16/2018 1715   LEUKOCYTESUR NEGATIVE 09/16/2018 1715   Sepsis Labs: @LABRCNTIP (procalcitonin:4,lacticidven:4)  ) Recent Results (from the past 240 hour(s))  SARS Coronavirus 2 [facility] order, Performed in [place] hospital  lab)     Status: None   Collection Time: 09/13/18  3:43 PM  Result Value Ref Range Status   SARS Coronavirus 2 NEGATIVE NEGATIVE Final    Comment: (NOTE) If result is NEGATIVE SARS-CoV-2 target nucleic acids are NOT DETECTED. The SARS-CoV-2 RNA is generally detectable in upper and lower  respiratory specimens during the acute phase of infection. The lowest  concentration of SARS-CoV-2 viral copies this assay can detect is 250  copies / mL. A negative result does not preclude SARS-CoV-2 infection  and should not be used as the sole basis for treatment or other  patient management decisions.  A negative result may occur with  improper specimen collection / handling, submission of specimen other  than nasopharyngeal swab, presence of viral mutation(s) within the  areas targeted by this assay, and inadequate number of viral copies  (<250 copies / mL). A negative result must be combined with clinical  observations, patient history, and epidemiological information. If result is POSITIVE SARS-CoV-2 target nucleic acids are DETECTED.  The SARS-CoV-2 RNA is generally detectable in upper and lower  respiratory specimens dur ing the acute phase of infection.  Positive  results are indicative of active infection with SARS-CoV-2.  Clinical  correlation with patient history and other diagnostic information is  necessary to determine patient infection status.  Positive results do  not rule out bacterial infection or co-infection with other viruses. If result is PRESUMPTIVE POSTIVE SARS-CoV-2 nucleic acids MAY BE PRESENT.   A presumptive positive result was obtained on the submitted specimen  and confirmed on repeat testing.  While 2019 novel coronavirus  (SARS-CoV-2) nucleic acids may be present in the submitted sample  additional confirmatory testing may be necessary for epidemiological  and / or clinical management purposes  to differentiate between  SARS-CoV-2 and other Sarbecovirus currently known to infect humans.  If clinically indicated additional testing with an alternate test  methodology [phone]) is advised. The SARS-CoV-2 RNA is generally  detectable in upper and lower respiratory sp ecimens during the acute  phase of infection. The expected result is Negative. Fact Sheet for Patients:  StrictlyIdeas.no Fact Sheet for Healthcare Providers: BankingDealers.co.za This test is not yet approved or cleared by the [place] FDA and has been authorized for detection and/or diagnosis of SARS-CoV-2 by FDA under an Emergency Use Authorization (EUA).  This EUA will remain in effect (meaning this test can be used) for the duration of the COVID-19 declaration under Section 564(b)(1) of the Act, 21 U.S.C. section 360bbb-3(b)(1), unless the authorization is terminated or revoked sooner. Performed at [facility], [place] [address], [place], [place] [address]   MRSA PCR Screening     Status: None   Collection Time: 09/13/18  6:11 PM   Specimen: Nasal Mucosa;  Nasopharyngeal  Result Value Ref Range Status   MRSA by PCR NEGATIVE NEGATIVE Final    Comment:        The GeneXpert MRSA Assay (FDA approved for NASAL specimens only), is one component of a comprehensive MRSA colonization surveillance program. It is not intended to diagnose MRSA infection nor to guide or monitor treatment for MRSA infections. Performed at [facility], [place] [address], [place], [place] [address]   Respiratory Panel by PCR     Status: None   Collection Time: 09/13/18  6:12 PM   Specimen: Nasopharyngeal Swab; Respiratory  Result Value Ref Range Status   Adenovirus NOT DETECTED NOT DETECTED Final   Coronavirus 229E NOT DETECTED  NOT DETECTED Final    Comment: (NOTE) The Coronavirus on the Respiratory Panel, DOES NOT test for the novel  Coronavirus (2019 nCoV)    Coronavirus HKU1 NOT DETECTED NOT DETECTED Final   Coronavirus NL63 NOT DETECTED NOT DETECTED Final   Coronavirus OC43 NOT DETECTED NOT DETECTED Final   Metapneumovirus NOT DETECTED NOT DETECTED Final   Rhinovirus / Enterovirus NOT DETECTED NOT DETECTED Final   Influenza A NOT DETECTED NOT DETECTED Final   Influenza B NOT DETECTED NOT DETECTED Final   Parainfluenza Virus 1 NOT DETECTED NOT DETECTED Final   Parainfluenza Virus 2 NOT DETECTED NOT DETECTED Final   Parainfluenza Virus 3 NOT DETECTED NOT DETECTED Final   Parainfluenza Virus 4 NOT DETECTED NOT DETECTED Final   Respiratory Syncytial Virus NOT DETECTED NOT DETECTED Final   Bordetella pertussis NOT DETECTED NOT DETECTED Final   Chlamydophila pneumoniae NOT DETECTED NOT DETECTED Final   Mycoplasma pneumoniae NOT DETECTED NOT DETECTED Final    Comment: Performed at [facility], [place] [address], [place], [place] [address]  Culture, Urine     Status: None   Collection Time: 09/16/18  5:49 PM   Specimen: Urine, Clean Catch  Result Value Ref Range Status   Specimen Description URINE, CLEAN CATCH  Final   Special Requests NONE  Final    Culture   Final    NO GROWTH Performed at [facility], [place] [address], [place], [place] [address]    Report Status 09/17/2018 FINAL  Final  Expectorated sputum assessment w rflx to resp cult     Status: None   Collection Time: 09/19/18 10:14 AM   Specimen: Sputum  Result Value Ref Range Status   Specimen Description SPUTUM  Final   Special Requests NONE  Final   Sputum evaluation   Final    THIS SPECIMEN IS ACCEPTABLE FOR SPUTUM CULTURE Performed at [facility], [place] [address] [place], [place] [address]    Report Status 09/19/2018 FINAL  Final  Culture, respiratory     Status: None (Preliminary result)   Collection Time: 09/19/18 10:14 AM   Specimen: SPU  Result Value Ref Range Status   Specimen Description SPUTUM  Final   Special Requests NONE Reflexed from [id number]  Final   Gram Stain   Final    FEW WBC PRESENT, PREDOMINANTLY PMN RARE GRAM POSITIVE COCCI RARE YEAST    Culture   Final    CULTURE REINCUBATED FOR BETTER GROWTH Performed at [facility], [place] [address], [place], [place] [address]    Report Status PENDING  Incomplete  Culture, blood (routine x 2)     Status: None (Preliminary result)   Collection Time: 09/19/18 10:34 AM   Specimen: BLOOD LEFT ARM  Result Value Ref Range Status   Specimen Description BLOOD LEFT ARM  Final   Special Requests   Final    BOTTLES DRAWN AEROBIC ONLY Blood Culture results may not be optimal due to an inadequate volume of blood received in culture bottles   Culture   Final    NO GROWTH < 24 HOURS Performed at [facility], [place] [address] [place], [place] [address]    Report Status PENDING  Incomplete  Culture, blood (routine x 2)     Status: None (Preliminary result)   Collection Time: 09/19/18 10:39 AM   Specimen: BLOOD LEFT HAND  Result Value Ref Range Status   Specimen Description BLOOD LEFT HAND  Final  Special Requests   Final    BOTTLES DRAWN AEROBIC ONLY Blood Culture results may not be optimal  due to an inadequate volume of blood received in culture bottles   Culture   Final    NO GROWTH < 24 HOURS Performed at [place] [address] [place], [place] [address]    Report Status PENDING  Incomplete      Studies: Dg Chest Port 1 View  Result Date: 09/20/2018 CLINICAL DATA:  Pneumonia.  History of COPD. EXAM: PORTABLE CHEST 1 VIEW COMPARISON:  Single-view of the chest 09/18/2018 and 09/13/2018. CT chest 09/13/2018. FINDINGS: The lungs are emphysematous. Right basilar airspace disease persists but has improved since the most recent comparison. Mild opacity in left lung base noted. No pneumothorax. Small right pleural effusion. Heart size is normal. Aortic atherosclerosis. IMPRESSION: Improved right lower lobe pneumonia with associated small right pleural effusion. Minimal left basilar opacity also noted. Emphysema. Electronically Signed   By: Inge Rise M.D.   On: 09/20/2018 08:14    Scheduled Meds: . aspirin EC  81 mg Oral Daily  . atorvastatin  20 mg Oral QHS  . Chlorhexidine Gluconate Cloth  6 each Topical Daily  . enoxaparin (LOVENOX) injection  40 mg Subcutaneous Daily  . fluticasone  2 spray Each Nare Daily  . gabapentin  300 mg Oral q morning - 10a  . gabapentin  600 mg Oral QHS  . guaiFENesin  600 mg Oral BID  . insulin aspart  0-15 Units Subcutaneous TID WC  . insulin aspart  0-5 Units Subcutaneous QHS  . insulin detemir  8 Units Subcutaneous BID  . ipratropium  0.5 mg Inhalation TID  . levalbuterol  1.25 mg Nebulization TID  . lidocaine  1 patch Transdermal Q24H  . pantoprazole  40 mg Oral BID  . senna-docusate  1 tablet Oral QHS  . sertraline  50 mg Oral Daily  . traZODone  50 mg Oral QHS    Continuous Infusions: . azithromycin 500 mg (09/19/18 1909)  . meropenem (MERREM) IV 1 g (09/20/18 0536)  . phenylephrine (NEO-SYNEPHRINE) Adult infusion Stopped (09/19/18 0939)     LOS: 10 days     Kayleen Memos, MD [facility] Pager  [phone]  If 7PM-7AM, please contact night-coverage [email or web address] Password [email or web address] 09/20/2018, 1:21 PM

## 2018-09-20 NOTE — Progress Notes (Signed)
Discussed with ID nurse Geralyn Corwin in relation to patient being tested for TB and not being placed in airborne precautions. Patient had CT scan on 8/30 that had in impression of PNA with no cavitations. Was brought to this RNs attention that patient should have been placed on airborne precautions and spoke with Dr. Graylon Good whom confirmed that this is aspiration PNA versus TB and should not be placed on precautions at this time. Elmyra Ricks spoke with Dr. Graylon Good who stated if there was any push back about transferring this patient, they could page her directly at [phone]. Continue IV ABX at this time.

## 2018-09-21 LAB — BASIC METABOLIC PANEL
Anion gap: 9 (ref 5–15)
BUN: 11 mg/dL (ref 8–23)
CO2: 27 mmol/L (ref 22–32)
Calcium: 8 mg/dL — ABNORMAL LOW (ref 8.9–10.3)
Chloride: 96 mmol/L — ABNORMAL LOW (ref 98–111)
Creatinine, Ser: 1.28 mg/dL — ABNORMAL HIGH (ref 0.61–1.24)
GFR calc Af Amer: >60 mL/min (ref >60)
GFR calc non Af Amer: 57 mL/min — ABNORMAL LOW (ref >60)
Glucose, Bld: 153 mg/dL — ABNORMAL HIGH (ref 70–99)
Potassium: 3.8 mmol/L (ref 3.5–5.1)
Sodium: 132 mmol/L — ABNORMAL LOW (ref 135–145)

## 2018-09-21 LAB — TROPONIN I (HIGH SENSITIVITY)
Troponin I (High Sensitivity): 61 ng/L — ABNORMAL HIGH (ref <18)
Troponin I (High Sensitivity): 85 ng/L — ABNORMAL HIGH (ref <18)
Troponin I (High Sensitivity): 82 ng/L — ABNORMAL HIGH (ref <18)

## 2018-09-21 LAB — CBC
HCT: 21.5 % — ABNORMAL LOW (ref 39.0–52.0)
Hemoglobin: 7.2 g/dL — ABNORMAL LOW (ref 13.0–17.0)
MCH: 33.8 pg (ref 26.0–34.0)
MCHC: 33.5 g/dL (ref 30.0–36.0)
MCV: 100.9 fL — ABNORMAL HIGH (ref 80.0–100.0)
Platelets: 284 10*3/uL (ref 150–400)
RBC: 2.13 MIL/uL — ABNORMAL LOW (ref 4.22–5.81)
RDW: 17.1 % — ABNORMAL HIGH (ref 11.5–15.5)
WBC: 7.2 10*3/uL (ref 4.0–10.5)
nRBC: 0 % (ref 0.0–0.2)

## 2018-09-21 LAB — CULTURE, RESPIRATORY W GRAM STAIN: Culture: NORMAL

## 2018-09-21 LAB — LEGIONELLA PNEUMOPHILA SEROGP 1 UR AG: L. pneumophila Serogp 1 Ur Ag: NEGATIVE

## 2018-09-21 LAB — GLUCOSE, CAPILLARY
Glucose-Capillary: 254 mg/dL — ABNORMAL HIGH (ref 70–99)
Glucose-Capillary: 151 mg/dL — ABNORMAL HIGH (ref 70–99)
Glucose-Capillary: 171 mg/dL — ABNORMAL HIGH (ref 70–99)
Glucose-Capillary: 141 mg/dL — ABNORMAL HIGH (ref 70–99)

## 2018-09-21 MED ORDER — MAGNESIUM SULFATE 2 GM/50ML IV SOLN
2.0000 g | Freq: Once | INTRAVENOUS | Status: AC
  Administered 2018-09-21: 2 g via INTRAVENOUS
  Filled 2018-09-21: qty 50

## 2018-09-21 MED ORDER — PHENAZOPYRIDINE HCL 100 MG PO TABS
100.0000 mg | ORAL_TABLET | Freq: Three times a day (TID) | ORAL | Status: AC
  Administered 2018-09-21 – 2018-09-22 (×5): 100 mg via ORAL
  Filled 2018-09-21 (×7): qty 1

## 2018-09-21 MED ORDER — ORAL CARE MOUTH RINSE
15.0000 mL | Freq: Two times a day (BID) | OROMUCOSAL | Status: DC
  Administered 2018-09-21 – 2018-09-28 (×12): 15 mL via OROMUCOSAL

## 2018-09-21 MED ORDER — KETOROLAC TROMETHAMINE 15 MG/ML IJ SOLN
15.0000 mg | Freq: Once | INTRAMUSCULAR | Status: AC
  Administered 2018-09-21: 01:00:00 15 mg via INTRAVENOUS
  Filled 2018-09-21: qty 1

## 2018-09-21 MED ORDER — SODIUM CHLORIDE 0.9 % IV SOLN
2.0000 g | Freq: Two times a day (BID) | INTRAVENOUS | Status: AC
  Administered 2018-09-21 – 2018-09-23 (×6): 2 g via INTRAVENOUS
  Filled 2018-09-21 (×7): qty 2

## 2018-09-21 NOTE — Progress Notes (Signed)
Trop 85 MD made aware

## 2018-09-21 NOTE — Progress Notes (Signed)
Physical Therapy Treatment Patient Details Name: Adrian Andrews MRN: [id number] DOB: [birth year] Today's Date: 09/21/2018    History of Present Illness Adrian Andrews is a 68 y.o. male with medical history significant of CVA; carotid stenosis s/p B stents; HTN; HLD; DM; COPD on 2L home O2; and stage 3 CKD presenting with fever, cough, and diarrhea. Found to have Sepsis due to pneumonia    PT Comments    Pt supine on arrival with difficulty arranging items on tray with decreased cognitive processing. Pt with increased tolerance for limited mobility but continues to be unable to progress beyond 10' of gait at a time limited by pulmonary status and back pain. Pt needs continued assist and cues for transfers and sNF remains appropriate as pt lives alone and cannot care for himself at this time. Pt with decreased safety and requires +2 assist to manage lines and safety in small room.  Pt on 3L at rest with SpO2 97% and HR 115  Transition to EOB SpO2 dropped to 81% Increased O2 to 6L with 90% and HR 100 On 6L with limited gait desaturating to 85% with HR 122 End of session on 4L SpO2 90% with HR 115   Follow Up Recommendations  SNF;Supervision/Assistance - 24 hour     Equipment Recommendations  Rolling walker with 5" wheels    Recommendations for Other Services       Precautions / Restrictions Precautions Precautions: Fall Precaution Comments: watch O2 Restrictions Weight Bearing Restrictions: No    Mobility  Bed Mobility Overal bed mobility: Needs Assistance Bed Mobility: Supine to Sit     Supine to sit: Min guard;HOB elevated     General bed mobility comments: min guard for safety, line mgmt; HOB elevated with increased time   Transfers Overall transfer level: Needs assistance Equipment used: Rolling walker (2 wheeled);None Transfers: Sit to/from Stand Sit to Stand: Min assist;+2 safety/equipment         General transfer comment: min assist to power up into standing  with cueing for hand placement and technique, poor recall of technique and safety with repetition to not grasp RW for standing,  +2 for lines  Ambulation/Gait Ambulation/Gait assistance: Min assist Gait Distance (Feet): 10 Feet Assistive device: Rolling walker (2 wheeled) Gait Pattern/deviations: Step-through pattern;Decreased stride length;Trunk flexed   Gait velocity interpretation: <1.8 ft/sec, indicate of risk for recurrent falls General Gait Details: pt walked 6' from bed to toilet then 6' return limited by back pain with seated rest EOB prior to final 10' of gait with RW to recliner. Cues for posture, proximity with assist to direct RW to avoid obstacles and maneuver in room.   Stairs             Wheelchair Mobility    Modified Rankin (Stroke Patients Only)       Balance Overall balance assessment: Needs assistance Sitting-balance support: No upper extremity supported;Feet supported Sitting balance-Leahy Scale: Good     Standing balance support: Bilateral upper extremity supported;During functional activity Standing balance-Leahy Scale: Poor Standing balance comment: relaint on BUE and external suppport; with 0-1 hand support noted posterior lean requiring min-mod assist                             Cognition Arousal/Alertness: Awake/alert Behavior During Therapy: WFL for tasks assessed/performed Overall Cognitive Status: Impaired/Different from baseline Area of Impairment: Safety/judgement;Awareness;Problem solving  Safety/Judgement: Decreased awareness of safety;Decreased awareness of deficits Awareness: Emergent Problem Solving: Slow processing;Difficulty sequencing;Requires verbal cues General Comments: cueing for problem solving and safety awareness      Exercises      General Comments       Pertinent Vitals/Pain Pain Assessment: Faces Pain Score: 6  Faces Pain Scale: Hurts even more Pain Location:  back pain  Pain Descriptors / Indicators: Discomfort;Grimacing;Guarding Pain Intervention(s): Limited activity within patient's tolerance;Monitored during session;Repositioned    Home Living                      Prior Function            PT Goals (current goals can now be found in the care plan section) Progress towards PT goals: Progressing toward goals    Frequency    Min 2X/week      PT Plan Current plan remains appropriate;Frequency needs to be updated    Co-evaluation PT/OT/SLP Co-Evaluation/Treatment: Yes Reason for Co-Treatment: For patient/therapist safety PT goals addressed during session: Mobility/safety with mobility;Balance;Proper use of DME OT goals addressed during session: ADL's and self-care      AM-PAC PT "6 Clicks" Mobility   Outcome Measure  Help needed turning from your back to your side while in a flat bed without using bedrails?: A Little Help needed moving from lying on your back to sitting on the side of a flat bed without using bedrails?: A Little Help needed moving to and from a bed to a chair (including a wheelchair)?: A Little Help needed standing up from a chair using your arms (e.g., wheelchair or bedside chair)?: A Little Help needed to walk in hospital room?: A Lot Help needed climbing 3-5 steps with a railing? : Total 6 Click Score: 15    End of Session Equipment Utilized During Treatment: Oxygen Activity Tolerance: Patient limited by fatigue Patient left: in chair;with call bell/phone within reach;with chair alarm set Nurse Communication: Mobility status PT Visit Diagnosis: Other abnormalities of gait and mobility (R26.89);Muscle weakness (generalized) (M62.81)     Time: [id number] PT Time Calculation (min) (ACUTE ONLY): 31 min  Charges:  $Gait Training: 8-22 mins                     [place], PT Acute Rehabilitation Services Pager: [phone] Office: [place] 09/21/2018, 1:14  PM

## 2018-09-21 NOTE — Progress Notes (Signed)
Pt transferred to 27M Report given to [facility]

## 2018-09-21 NOTE — Progress Notes (Signed)
  Speech Language Pathology Treatment: Dysphagia  Patient Details Name: Adrian Andrews MRN: [id number] DOB: 03/24/1950 Today's Date: 09/21/2018 Time: [id number] SLP Time Calculation (min) (ACUTE ONLY): 38 min  Assessment / Plan / Recommendation Clinical Impression  Pt consumed regular/thin liquids via cup/straw with min verbal/visual cueing provided paired with pt education re: breathing/swallowing reciprocity during intake with one incidence of delayed throat clearing after all intake completed; pt stated he is "trying to clear stuff from my lungs."  Denies any coughing/throat clearing during meals; nursing confirmed this statement; No overt s/s of aspiration noted during snack (pt only ate minimal amount d/t refusal); CXR improved 09/21/18  stating "improved right lower lobe PNA with associated right pleural effusion; minimal left basilar opacity also noted." and medication given during trial without dysphagia observed; recommend continuing current diet with swallowing precautions in place with ST f/u for meal intake/diet tolerance to r/o potential aspiration during larger meals.    HPI HPI: Adrian Andrews is a 68 y.o. male admitted 09/10/2018 with fever, SOB, and productive cough. PMH: CVA, carotid stenosis s/p B stents; HTN; HLD; DM; COPD on 3L home O2; stage 3 CKD, chronic back pain; No prior SLP intervention found. CXR = RLL PNA.       SLP Plan  Continue with current plan of care       Recommendations  Diet recommendations: Regular;Thin liquid Liquids provided via: Cup;Straw Medication Administration: Whole meds with liquid Supervision: Patient able to self feed;Intermittent supervision to cue for compensatory strategies Compensations: Slow rate;Small sips/bites;Other (Comment) Postural Changes and/or Swallow Maneuvers: Seated upright 90 degrees;Upright 30-60 min after meal                Oral Care Recommendations: Oral care BID Follow up Recommendations: None SLP Visit Diagnosis:  Dysphagia, unspecified (R13.10) Plan: Continue with current plan of care                       Elvina Sidle, M.S., CCC-SLP 09/21/2018, 11:52 AM

## 2018-09-21 NOTE — Progress Notes (Signed)
MD made aware pt will have to transferred to get warmed blood transfusion. Orders place to transfer to ICU.

## 2018-09-21 NOTE — Plan of Care (Signed)
  Problem: Activity: Goal: Risk for activity intolerance will decrease Outcome: Progressing   

## 2018-09-21 NOTE — Progress Notes (Signed)
ID PROGRESS NOTE  68yo M with slow to resolve pneumonia  In last 48hr appears improving, both by oxygen needs, labs, and cxr  No new micro data   A/p: hospital associated pneumonia  - due to slow response, will recommend to complete 14 day course. - will switch back to cefepime for 2 addn day - currently on day 12 of 14 - his last day of cefepime and azithromycin will be on 9/9  Will sign off  [facility] [address] [phone]

## 2018-09-21 NOTE — Progress Notes (Signed)
PROGRESS NOTE  Adrian Andrews [id number] DOB: 1950/09/28 DOA: 09/10/2018 PCP: [name], MD  HPI/Recap of past 22 hours: 68 year old male with history of CVA, carotid artery stenosis/stent, DM-2, CKD 3, HTN, HLD, COPD-3L and former smoker presenting with fever, productive cough, shortness of breath and diarrhea and admitted for sepsis due to RLL pneumonia, and AKI.  In ED, soft blood pressures.  Lactic acidosis.  CXR revealed RLL pneumonia.  COVID-19 negative. Started on ceftriaxone, azithromycin and a steroid.  PCCM consulted given soft blood pressure and uptrending lactic acidosis and recommended hydration.  Hospital course complicated by severe hypoxia requiring high flow nasal cannula.  CT chest done without contrast due to allergy showed no clear evidence of pulmonary embolism.  Revealed bilateral pulmonary infiltrates with increase in pulmonary vascularity.  Started on IV Lasix with improvement of hypoxia.  Elevated inflammatory markers with suspicion for COVID-19.  Repeated in-house COVID-19 negative.  2D echo done on 09/14/2018 unrevealing.  Showing normal LVEF 55 to 60%.  Hospital course complicated by respiratory distress on 09/18/2018 and severe hypotension requiring pressors secondary to septic shock in the setting of cavitary pneumonia.  Transferred to ICU on 09/18/2018.  09/20/18: Patient was seen and examined at his bedside this morning.  He is much improved.  He has no new complaints.  He denies chest pain, dyspnea at rest or palpitation.  Wean off Ventimask to 1 L with saturation in the low to mid 90s.  Maintaining map greater than 65 without pressors.  Will transfer to medical telemetry unit.  09/21/18: Patient seen and examined at his bedside this morning.  He has no new complaints.  Tachycardic, twelve-lead EKG ST depression V4 V5 V6.  Troponin 61, cycle.  Asymptomatic.  Denies any chest pain, palpitationsw or shortness of breath.   Assessment/Plan: Principal Problem:    Severe sepsis (HCC) Active Problems:   Acute respiratory failure with hypoxia (HCC)   COPD (chronic obstructive pulmonary disease) (HCC)   Type 2 diabetes mellitus with other specified complication (HCC)   CKD (chronic kidney disease) stage 3, GFR 30-59 ml/min (HCC)   RLL pneumonia (HCC)   Essential hypertension   Acute on chronic diastolic CHF (congestive heart failure) ([place])   Hyperbilirubinemia   Septic shock (HCC)   Resolving septic shock secondary to cavitary community-acquired pneumonia  Presented with tachypnea and tachycardia with right lower lobe infiltrates on chest x-ray done on 09/10/2018.  Procalcitonin 7.44>> 5.25>> 2.19>> 0.47 on 09/18/18 COVID-19 negative x2 during this admission MRSA screening negative Completed 7 days of IVcefepime Blood cultures drawn on 09/10/2018 final negative  Urine culture drawn on 09/16/2018 final negative Repeat blood cultures peripherally x2 on 09/19/2018 AFB sent Sputum culture reintubated for better growth, Gram stain showing rare gram-positive cocci and rare yeast Started on meropenem from 09/18/2018 with pressors IV azithromycin re-added on 09/19/2018 Seen by infectious disease, following. Off pressors since 09/19/2018 Maintaining map greater than 65 without pressors Transfer out of the unit on 09/20/2018 to medical telemetry and continue to closely monitor Cefepime DC'd on 09/21/2018 and Rocephin was started x2 days to complete 14 days of IV antibiotics. Repeated blood cultures x2 done on 09/19/2018 - to date.  Nonsustained V. tach, asymptomatic Denies any anginal symptoms Continue to closely monitor on telemetry Twelve-lead EKG done this morning 09/21/2018 showed ST depression V4 V5 V6 Elevated troponin 61, repeat, if trend up will consult cardiology  Acute on chronic hypoxic respiratory failure suspect multifactorial secondary to cavitary CAP, acute on chronic diastolic CHF,  right pleural effusion, ?aspiration On 2 L of oxygen as needed at  baseline Increase in oxygen requirement on 09/18/18 HFNC >> Venturi mask Pulmonology reconsulted on 09/18/2018 Personally reviewed chest x-ray done on 09/18/2018 which shows right pleural effusion and bibasilar infiltrates ?aspiration, seen by speech therapy  Continue bronchodilators Continue to maintain O2 saturation greater than 92% PCCM has been consulted to assist with the management Personally reviewed chest x-ray done on 09/20/2018 which showed right mid and right lower lobe infiltrate, and small right pleural effusion. O2 saturation 98% on 3 L.  Right pleural effusion suspect infective from cavitary pneumonia  Last 2D echo normal LVEF Off diuretic due to soft BP Continue meropenem and IV azithromycin Personally reviewed chest x-ray done on 09/20/2018, right mid, right lower lobe infiltrates with small right pleural effusion  Suspected dysphagia Seen by speech therapy with recommendations Aspiration precautions  Acute blood loss anemia, unclear etiology Hemoglobin dropped from 9.2 to 7.1> 7.8 FOBT, iron studies ordered Rule out GI bleed 2U PRBCs ordered to be transfused Repeat H&H post transfusion.  Hypomagnesemia Magnesium 1.7 Replete as indicated  Resolving hypotension No longer requiring pressors Maintain map greater than 65  Cavitary community-acquired pneumonia, POA Management as stated above Mucinex for cough as needed Pulmonary toilet as needed Continue flutter valve as tolerated AFB ordered, seen by infectious disease.  Chronic low back pain Continue lidocaine patch  Elevated d-dimer D-dimer 11 on 09/13/2018>> 2.3 on 09/18/18 No evidence of PE on CT chest no contrast Negative DVT in lower extremities bilaterally COVID 19-x2 during this admission  Hypotensive in the setting of septic shock.  Home atenolol has been held  Significantly elevated BNP with concern for acute on chronic diastolic CHF BNP from [id number] on 02/13/2018 to greater than 2300 on 09/13/2018 2D echo  done on 09/14/2018 showed normal LVEF 55 to 60%.  IV Lasix 40 mg daily Net I&O -17.9 L since admission Continue strict I's and O's and daily weight  Resolved recurrent fevers, unclear etiology  Hypokalemia/hypophosphatemia/hypomagnesemia Replete as indicated  Resolving isolated hyperbilirubinemia, unclear etiology Total bilirubin 2.3 on 09/15/18>>3.0 on 09/17/18 >>1.8 on 09/18/18>> 2.1 on 09/19/18 Alkaline phosphatase, AST and ALT normal  Resolving euvolemic hyponatremia Na+ 134 >> 132 on 09/19/18>> 133 on 09/20/2018. Continue to monitor  AKI on CKD 2 Baseline creatinine 1.1 with GFR greater than 60  Creatinine at baseline 1.1 GFR greater than 60 on 09/20/2018. Continue to avoid nephrotoxins and continue to monitor urine output  Resolved mild thrombocytopenia Presented with platelet count of 111 Platelet count 290 on 09/18/2018  Prior polysubstance abuse with benzodiazepine and cocaine Last UDS on 05/04/2016+ for benzodiazepine and cocaine No repeated UDS during this admission  Chronic anxiety/depression Resume Zoloft, trazodone, Neurontin when hemodynamically stable  COPD Continue inhalers  Type 2 diabetes with hyperglycemia Hemoglobin A1c 7.6 on 09/10/2018 Resume Levemir and continue insulin sliding scale Avoid hypoglycemia  Physical debility/ambulatory dysfunction PT OT assessment recommended SNF CSW consulted for assistance in SNF placement Continue fall precautions Ambulate every shift as tolerated  Code Status: DNR  Family Communication: None at bedside  Disposition Plan: Discharge to SNF possibly in 2 days after completing IV antibiotics.  Consultants:  PCCM  Infectious disease on 09/19/2018.  Procedures:  None  Antimicrobials:  Meropenem  DVT prophylaxis: Subcu Lovenox daily.   Objective: Vitals:   09/21/18 0839 09/21/18 1029 09/21/18 1330 09/21/18 1410  BP:   111/64   Pulse: (!) 110 (!) 113 99 98  Resp: (!) 22 20 18  18  Temp:   (!) 97.5 F (36.4 C)    TempSrc:   Oral   SpO2: 93% 98% 98% 98%  Weight:      Height:        Intake/Output Summary (Last 24 hours) at 09/21/2018 1456 Last data filed at 09/21/2018 1300 Gross per 24 hour  Intake 711.49 ml  Output 2850 ml  Net -2138.51 ml   Filed Weights   09/19/18 0500 09/20/18 0500 09/21/18 0500  Weight: 79 kg 74.5 kg 82 kg    Exam:  . General: 68 y.o. year-old male frail-appearing no acute distress.  Alert oriented x3.   . Cardiovascular: Regular rate and rhythm no rubs or gallops no JVD or thyromegaly noted. Marland Kitchen Respiratory: Faint rales at the right base.  No wheezing noted.  Poor inspiratory effort.   . Abdomen: Nontender nondistended bowel sounds present. . Musculoskeletal: No lower extremity edema.  2/4 pulses in all 4 extremities.  Marland Kitchen Psychiatry: Mood is appropriate for condition and setting.    Data Reviewed: CBC: Recent Labs  Lab 09/15/18 0401 09/18/18 0536 09/18/18 1136 09/19/18 0639 09/20/18 0416 09/21/18 0542  WBC 11.4* 13.7*  --  12.9* 10.7* 7.2  NEUTROABS  --   --   --  12.2*  --   --   HGB 9.2* 7.1* 8.1* 7.3* 7.8* 7.2*  HCT 26.9* 21.0* 23.9* 22.8* 23.4* 21.5*  MCV 94.4 95.5  --  100.0 99.6 100.9*  PLT 241 290  --  358 327 [id number]   Basic Metabolic Panel: Recent Labs  Lab 09/15/18 0401 09/16/18 0416 09/18/18 0536 09/19/18 0639 09/20/18 0416 09/21/18 0542  NA 130* 130* 134* 132* 133* 132*  K 3.4* 3.5 3.7 3.8 3.8 3.8  CL 95* 94* 99 95* 98 96*  CO2 25 27 28 26 27 27   GLUCOSE 194* 161* 141* 177* 196* 153*  BUN 39* 33* 20 17 12 11   CREATININE 1.30* 1.32* 1.15 1.23 1.11 1.28*  CALCIUM 8.0* 7.8* 7.8* 8.1* 8.2* 8.0*  MG 1.8  --   --  1.7  --   --   PHOS 2.1*  --   --  2.4*  --   --    GFR: Estimated Creatinine Clearance: 54.5 mL/min (A) (by C-G formula based on SCr of 1.28 mg/dL (H)). Liver Function Tests: Recent Labs  Lab 09/15/18 0401 09/16/18 0416 09/17/18 0618 09/18/18 0536 09/19/18 0639  AST 26 17 14* 17 20  ALT 36 26 23 22 24   ALKPHOS 63 63 66  62 67  BILITOT 2.3* 3.1* 3.0* 1.8* 2.1*  PROT 4.9* 4.7* 4.8* 4.6* 5.3*  ALBUMIN 1.9* 1.6* 1.9* 1.8* 2.4*   No results for input(s): LIPASE, AMYLASE in the last 168 hours. No results for input(s): AMMONIA in the last 168 hours. Coagulation Profile: No results for input(s): INR, PROTIME in the last 168 hours. Cardiac Enzymes: No results for input(s): CKTOTAL, CKMB, CKMBINDEX, TROPONINI in the last 168 hours. BNP (last 3 results) No results for input(s): PROBNP in the last 8760 hours. HbA1C: No results for input(s): HGBA1C in the last 72 hours. CBG: Recent Labs  Lab 09/20/18 0745 09/20/18 1147 09/20/18 1641 09/20/18 2122 09/21/18 1135  GLUCAP 199* 196* 203* 219* 254*   Lipid Profile: No results for input(s): CHOL, HDL, LDLCALC, TRIG, CHOLHDL, LDLDIRECT in the last 72 hours. Thyroid Function Tests: No results for input(s): TSH, T4TOTAL, FREET4, T3FREE, THYROIDAB in the last 72 hours. Anemia Panel: No results for input(s): VITAMINB12, FOLATE, FERRITIN, TIBC, IRON, RETICCTPCT in  the last 72 hours. Urine analysis:    Component Value Date/Time   COLORURINE YELLOW 09/16/2018 [place] 09/16/2018 1715   LABSPEC 1.006 09/16/2018 1715   PHURINE 6.0 09/16/2018 1715   GLUCOSEU NEGATIVE 09/16/2018 1715   HGBUR MODERATE (A) 09/16/2018 1715   BILIRUBINUR NEGATIVE 09/16/2018 1715   BILIRUBINUR neg 09/10/2013 1308   KETONESUR NEGATIVE 09/16/2018 1715   PROTEINUR NEGATIVE 09/16/2018 1715   UROBILINOGEN 0.2 09/10/2013 1308   NITRITE NEGATIVE 09/16/2018 1715   LEUKOCYTESUR NEGATIVE 09/16/2018 1715   Sepsis Labs: @LABRCNTIP (procalcitonin:4,lacticidven:4)  ) Recent Results (from the past 240 hour(s))  SARS Coronavirus 2 [facility] order, Performed in [place] hospital lab)     Status: None   Collection Time: 09/13/18  3:43 PM  Result Value Ref Range Status   SARS Coronavirus 2 NEGATIVE NEGATIVE Final    Comment: (NOTE) If result is NEGATIVE SARS-CoV-2 target  nucleic acids are NOT DETECTED. The SARS-CoV-2 RNA is generally detectable in upper and lower  respiratory specimens during the acute phase of infection. The lowest  concentration of SARS-CoV-2 viral copies this assay can detect is 250  copies / mL. A negative result does not preclude SARS-CoV-2 infection  and should not be used as the sole basis for treatment or other  patient management decisions.  A negative result may occur with  improper specimen collection / handling, submission of specimen other  than nasopharyngeal swab, presence of viral mutation(s) within the  areas targeted by this assay, and inadequate number of viral copies  (<250 copies / mL). A negative result must be combined with clinical  observations, patient history, and epidemiological information. If result is POSITIVE SARS-CoV-2 target nucleic acids are DETECTED. The SARS-CoV-2 RNA is generally detectable in upper and lower  respiratory specimens dur ing the acute phase of infection.  Positive  results are indicative of active infection with SARS-CoV-2.  Clinical  correlation with patient history and other diagnostic information is  necessary to determine patient infection status.  Positive results do  not rule out bacterial infection or co-infection with other viruses. If result is PRESUMPTIVE POSTIVE SARS-CoV-2 nucleic acids MAY BE PRESENT.   A presumptive positive result was obtained on the submitted specimen  and confirmed on repeat testing.  While 2019 novel coronavirus  (SARS-CoV-2) nucleic acids may be present in the submitted sample  additional confirmatory testing may be necessary for epidemiological  and / or clinical management purposes  to differentiate between  SARS-CoV-2 and other Sarbecovirus currently known to infect humans.  If clinically indicated additional testing with an alternate test  methodology [id number]) is advised. The SARS-CoV-2 RNA is generally  detectable in upper and lower  respiratory sp ecimens during the acute  phase of infection. The expected result is Negative. Fact Sheet for Patients:  StrictlyIdeas.no Fact Sheet for Healthcare Providers: BankingDealers.co.za This test is not yet approved or cleared by the [place] FDA and has been authorized for detection and/or diagnosis of SARS-CoV-2 by FDA under an Emergency Use Authorization (EUA).  This EUA will remain in effect (meaning this test can be used) for the duration of the COVID-19 declaration under Section 564(b)(1) of the Act, 21 U.S.C. section 360bbb-3(b)(1), unless the authorization is terminated or revoked sooner. Performed at [facility], [place] [address] [place], [place] [address]   MRSA PCR Screening     Status: None   Collection Time: 09/13/18  6:11 PM   Specimen: Nasal Mucosa; Nasopharyngeal  Result Value Ref Range  Status   MRSA by PCR NEGATIVE NEGATIVE Final    Comment:        The GeneXpert MRSA Assay (FDA approved for NASAL specimens only), is one component of a comprehensive MRSA colonization surveillance program. It is not intended to diagnose MRSA infection nor to guide or monitor treatment for MRSA infections. Performed at [facility], [place] [address] [place], [place] [address]   Respiratory Panel by PCR     Status: None   Collection Time: 09/13/18  6:12 PM   Specimen: Nasopharyngeal Swab; Respiratory  Result Value Ref Range Status   Adenovirus NOT DETECTED NOT DETECTED Final   Coronavirus 229E NOT DETECTED NOT DETECTED Final    Comment: (NOTE) The Coronavirus on the Respiratory Panel, DOES NOT test for the novel  Coronavirus (2019 nCoV)    Coronavirus HKU1 NOT DETECTED NOT DETECTED Final   Coronavirus NL63 NOT DETECTED NOT DETECTED Final   Coronavirus OC43 NOT DETECTED NOT DETECTED Final   Metapneumovirus NOT DETECTED NOT DETECTED Final   Rhinovirus / Enterovirus NOT DETECTED NOT DETECTED Final    Influenza A NOT DETECTED NOT DETECTED Final   Influenza B NOT DETECTED NOT DETECTED Final   Parainfluenza Virus 1 NOT DETECTED NOT DETECTED Final   Parainfluenza Virus 2 NOT DETECTED NOT DETECTED Final   Parainfluenza Virus 3 NOT DETECTED NOT DETECTED Final   Parainfluenza Virus 4 NOT DETECTED NOT DETECTED Final   Respiratory Syncytial Virus NOT DETECTED NOT DETECTED Final   Bordetella pertussis NOT DETECTED NOT DETECTED Final   Chlamydophila pneumoniae NOT DETECTED NOT DETECTED Final   Mycoplasma pneumoniae NOT DETECTED NOT DETECTED Final    Comment: Performed at [facility], [place]. [address], [place], [place] [address]  Culture, Urine     Status: None   Collection Time: 09/16/18  5:49 PM   Specimen: Urine, Clean Catch  Result Value Ref Range Status   Specimen Description URINE, CLEAN CATCH  Final   Special Requests NONE  Final   Culture   Final    NO GROWTH Performed at [facility], [place] [address] [place], [place] [address]    Report Status 09/17/2018 FINAL  Final  Expectorated sputum assessment w rflx to resp cult     Status: None   Collection Time: 09/19/18 10:14 AM   Specimen: Sputum  Result Value Ref Range Status   Specimen Description SPUTUM  Final   Special Requests NONE  Final   Sputum evaluation   Final    THIS SPECIMEN IS ACCEPTABLE FOR SPUTUM CULTURE Performed at [facility], [place] [address] [place], [place] [address]    Report Status 09/19/2018 FINAL  Final  Culture, respiratory     Status: None   Collection Time: 09/19/18 10:14 AM   Specimen: SPU  Result Value Ref Range Status   Specimen Description SPUTUM  Final   Special Requests NONE Reflexed from [id number]  Final   Gram Stain   Final    FEW WBC PRESENT, PREDOMINANTLY PMN RARE GRAM POSITIVE COCCI RARE YEAST    Culture   Final    Consistent with normal respiratory flora. Performed at [facility], [place] [address] [place], [place] [address]    Report Status 09/21/2018 FINAL  Final   Culture, blood (routine x 2)     Status: None (Preliminary result)   Collection Time: 09/19/18 10:34 AM   Specimen: BLOOD LEFT ARM  Result Value Ref Range Status   Specimen  Description BLOOD LEFT ARM  Final   Special Requests   Final    BOTTLES DRAWN AEROBIC ONLY Blood Culture results may not be optimal due to an inadequate volume of blood received in culture bottles   Culture   Final    NO GROWTH 2 DAYS Performed at [facility], [place] [address], [place], [place] [address]    Report Status PENDING  Incomplete  Culture, blood (routine x 2)     Status: None (Preliminary result)   Collection Time: 09/19/18 10:39 AM   Specimen: BLOOD LEFT HAND  Result Value Ref Range Status   Specimen Description BLOOD LEFT HAND  Final   Special Requests   Final    BOTTLES DRAWN AEROBIC ONLY Blood Culture results may not be optimal due to an inadequate volume of blood received in culture bottles   Culture   Final    NO GROWTH 2 DAYS Performed at [facility], [place] [address] [place], [place] [address]    Report Status PENDING  Incomplete      Studies: No results found.  Scheduled Meds: . aspirin EC  81 mg Oral Daily  . atorvastatin  20 mg Oral QHS  . Chlorhexidine Gluconate Cloth  6 each Topical Daily  . enoxaparin (LOVENOX) injection  40 mg Subcutaneous Daily  . fluticasone  2 spray Each Nare Daily  . gabapentin  300 mg Oral q morning - 10a  . gabapentin  600 mg Oral QHS  . guaiFENesin  600 mg Oral BID  . insulin aspart  0-15 Units Subcutaneous TID WC  . insulin aspart  0-5 Units Subcutaneous QHS  . insulin detemir  8 Units Subcutaneous BID  . ipratropium  0.5 mg Inhalation TID  . levalbuterol  1.25 mg Nebulization TID  . lidocaine  1 patch Transdermal Q24H  . pantoprazole  40 mg Oral BID  . phenazopyridine  100 mg Oral TID WC  . senna-docusate  1 tablet Oral QHS  . sertraline  50 mg Oral Daily  . traZODone  50 mg Oral QHS    Continuous Infusions: . azithromycin 500 mg  (09/20/18 1943)  . ceFEPime (MAXIPIME) IV 2 g (09/21/18 1256)  . phenylephrine (NEO-SYNEPHRINE) Adult infusion Stopped (09/19/18 0939)     LOS: 11 days     Kayleen Memos, MD [facility] Pager [phone]  If 7PM-7AM, please contact night-coverage [email or web address] Password [email or web address] 09/21/2018, 2:56 PM

## 2018-09-21 NOTE — Progress Notes (Addendum)
Occupational Therapy Treatment Patient Details Name: Adrian Andrews MRN: [id number] DOB: [birth year] Today's Date: 09/21/2018    History of present illness Adrian Andrews is a 68 y.o. male with medical history significant of CVA; carotid stenosis s/p B stents; HTN; HLD; DM; COPD on 2L home O2; and stage 3 CKD presenting with fever, cough, and diarrhea. Found to have Sepsis due to pneumonia   OT comments  Patient seen for cotx with PT.  Patient supine in bed and agreeable to participate.  Requires min assist +2 safety (line mgmt) for in room mobility using RW (with cueing for walker mgmt), min assist for toilet transfers with cueing for hand placement and safety, and min assist for toileting.  Initially on 3L supplemental O2 via [place] SpO2 97%, drop to 81% at EOB required increase to 6L during activity with max cueing for PLB to 90%, dropped to 85% with limited mobility in room, then returned to sitting in recliner on 4L after exertion at 90%.  Noted HR increased to low 140s with activity to/from restroom. Fatigues easily and complains of back pain with activity.  Will follow, SNF remain appropriate.    Follow Up Recommendations  SNF;Supervision/Assistance - 24 hour    Equipment Recommendations  None recommended by OT    Recommendations for Other Services      Precautions / Restrictions Precautions Precautions: Fall;Other (comment) Precaution Comments: watch O2 Restrictions Weight Bearing Restrictions: No       Mobility Bed Mobility Overal bed mobility: Needs Assistance Bed Mobility: Supine to Sit     Supine to sit: Min guard     General bed mobility comments: min guard for safety, line mgmt; HOB elevated with increased time   Transfers Overall transfer level: Needs assistance Equipment used: Rolling walker (2 wheeled);None Transfers: Sit to/from Stand Sit to Stand: Min assist;+2 safety/equipment         General transfer comment: min assist to power up into standing with  cueing for hand placement and technique, poor recall of technique and safety;  +2 for lines    Balance Overall balance assessment: Needs assistance Sitting-balance support: No upper extremity supported;Feet supported Sitting balance-Leahy Scale: Good     Standing balance support: Bilateral upper extremity supported;During functional activity Standing balance-Leahy Scale: Poor Standing balance comment: relaint on BUE and external suppport; with 0-1 hand support noted posterior lean requiring min-mod assist                            ADL either performed or assessed with clinical judgement   ADL Overall ADL's : Needs assistance/impaired     Grooming: Minimal assistance;Standing;Wash/dry hands                   Toilet Transfer: Minimal assistance;BSC;Cueing for safety;Cueing for sequencing;RW;Regular Glass blower/designer Details (indicate cue type and reason): 3:1 over commode  Toileting- Clothing Manipulation and Hygiene: Minimal assistance;Sit to/from stand Toileting - Clothing Manipulation Details (indicate cue type and reason): min assist for thoroughness     Functional mobility during ADLs: Minimal assistance;Rolling walker;Cueing for safety;Cueing for sequencing General ADL Comments: Pt on 3L supplemental O2 initally with saturations at 97%; required increase to 6L during functional tasks as O2 dropped to 82% with noted HR increased to low 140s; requires cueing for PLB throughout sesssion; on 6L with activity best O2 reading 90%; on 4L in recliner at rest a completion of session with 90% SpO2  Vision   Vision Assessment?: No apparent visual deficits   Perception     Praxis      Cognition Arousal/Alertness: Awake/alert Behavior During Therapy: WFL for tasks assessed/performed Overall Cognitive Status: Impaired/Different from baseline Area of Impairment: Safety/judgement;Awareness;Problem solving                         Safety/Judgement:  Decreased awareness of safety;Decreased awareness of deficits Awareness: Emergent Problem Solving: Slow processing;Difficulty sequencing;Requires verbal cues General Comments: cueing for problem sovling and safety awareness         Exercises     Shoulder Instructions       General Comments required 6L for SpO2 saturation during activity, on 4L at rest after exertion with SpO2 at 90%    Pertinent Vitals/ Pain       Pain Assessment: Faces Pain Score: 0-No pain Faces Pain Scale: Hurts even more Pain Location: back pain  Pain Descriptors / Indicators: Discomfort;Grimacing;Guarding Pain Intervention(s): Limited activity within patient's tolerance;Monitored during session  Home Living                                          Prior Functioning/Environment              Frequency  Min 2X/week        Progress Toward Goals  OT Goals(current goals can now be found in the care plan section)  Progress towards OT goals: Progressing toward goals     Plan Discharge plan remains appropriate;Frequency remains appropriate    Co-evaluation    PT/OT/SLP Co-Evaluation/Treatment: Yes Reason for Co-Treatment: For patient/therapist safety;To address functional/ADL transfers   OT goals addressed during session: ADL's and self-care      AM-PAC OT "6 Clicks" Daily Activity     Outcome Measure   Help from another person eating meals?: None Help from another person taking care of personal grooming?: A Little Help from another person toileting, which includes using toliet, bedpan, or urinal?: A Lot Help from another person bathing (including washing, rinsing, drying)?: A Lot Help from another person to put on and taking off regular upper body clothing?: A Little Help from another person to put on and taking off regular lower body clothing?: A Lot 6 Click Score: 16    End of Session Equipment Utilized During Treatment: Rolling walker;Oxygen  OT Visit Diagnosis:  Unsteadiness on feet (R26.81);Other abnormalities of gait and mobility (R26.89);Muscle weakness (generalized) (M62.81) Pain - part of body: (back)   Activity Tolerance Patient tolerated treatment well   Patient Left in chair;with call bell/phone within reach;with chair alarm set   Nurse Communication Mobility status        Time: [id number] OT Time Calculation (min): 31 min  Charges: OT General Charges $OT Visit: 1 Visit OT Treatments $Self Care/Home Management : 8-22 mins  Delight Stare, OT [place] Pager [phone] Office [phone]     Delight Stare 09/21/2018, 12:54 PM

## 2018-09-21 NOTE — Progress Notes (Signed)
Blood bank called regarding blood transfusion, states there is one unit of blood in blood bank with expired tonight, but will need to be warmed d/t special antibodies. MD made aware.

## 2018-09-21 NOTE — Progress Notes (Signed)
Pt. With 27 beats of Vtach. VSS. On call for [facility] paged to make aware. Pt. Resting in bed. RN will continue to monitor. Ailie Gage, Katherine Roan

## 2018-09-22 ENCOUNTER — Encounter (HOSPITAL_COMMUNITY): Payer: Self-pay | Admitting: Cardiology

## 2018-09-22 DIAGNOSIS — R6520 Severe sepsis without septic shock: Secondary | ICD-10-CM

## 2018-09-22 DIAGNOSIS — I472 Ventricular tachycardia: Secondary | ICD-10-CM

## 2018-09-22 DIAGNOSIS — R7989 Other specified abnormal findings of blood chemistry: Secondary | ICD-10-CM

## 2018-09-22 DIAGNOSIS — A419 Sepsis, unspecified organism: Principal | ICD-10-CM

## 2018-09-22 LAB — BASIC METABOLIC PANEL
Anion gap: 9 (ref 5–15)
BUN: 10 mg/dL (ref 8–23)
CO2: 24 mmol/L (ref 22–32)
Calcium: 8.5 mg/dL — ABNORMAL LOW (ref 8.9–10.3)
Chloride: 100 mmol/L (ref 98–111)
Creatinine, Ser: 1.15 mg/dL (ref 0.61–1.24)
GFR calc Af Amer: >60 mL/min (ref >60)
GFR calc non Af Amer: >60 mL/min (ref >60)
Glucose, Bld: 145 mg/dL — ABNORMAL HIGH (ref 70–99)
Potassium: 4.5 mmol/L (ref 3.5–5.1)
Sodium: 133 mmol/L — ABNORMAL LOW (ref 135–145)

## 2018-09-22 LAB — HEMOGLOBIN AND HEMATOCRIT, BLOOD
HCT: 27.7 % — ABNORMAL LOW (ref 39.0–52.0)
Hemoglobin: 9.6 g/dL — ABNORMAL LOW (ref 13.0–17.0)

## 2018-09-22 LAB — TYPE AND SCREEN
ABO/RH(D): A POS
Antibody Screen: POSITIVE
DAT, IgG: NEGATIVE
PT AG Type: POSITIVE
Unit division: 0
Unit division: 0

## 2018-09-22 LAB — GLUCOSE, CAPILLARY
Glucose-Capillary: 136 mg/dL — ABNORMAL HIGH (ref 70–99)
Glucose-Capillary: 183 mg/dL — ABNORMAL HIGH (ref 70–99)
Glucose-Capillary: 183 mg/dL — ABNORMAL HIGH (ref 70–99)
Glucose-Capillary: 210 mg/dL — ABNORMAL HIGH (ref 70–99)

## 2018-09-22 LAB — BPAM RBC
Blood Product Expiration Date: 202010102359
Blood Product Expiration Date: 202010102359
ISSUE DATE / TIME: 202009071829
ISSUE DATE / TIME: 202009072045
Unit Type and Rh: 5100
Unit Type and Rh: 5100

## 2018-09-22 LAB — MAGNESIUM: Magnesium: 1.7 mg/dL (ref 1.7–2.4)

## 2018-09-22 LAB — TROPONIN I (HIGH SENSITIVITY)
Troponin I (High Sensitivity): 131 ng/L (ref <18)
Troponin I (High Sensitivity): 99 ng/L — ABNORMAL HIGH (ref <18)
Troponin I (High Sensitivity): 96 ng/L — ABNORMAL HIGH (ref <18)

## 2018-09-22 LAB — CBC
HCT: 28.4 % — ABNORMAL LOW (ref 39.0–52.0)
Hemoglobin: 10.2 g/dL — ABNORMAL LOW (ref 13.0–17.0)
MCH: 35.4 pg — ABNORMAL HIGH (ref 26.0–34.0)
MCHC: 35.9 g/dL (ref 30.0–36.0)
MCV: 98.6 fL (ref 80.0–100.0)
Platelets: 258 10*3/uL (ref 150–400)
RBC: 2.88 MIL/uL — ABNORMAL LOW (ref 4.22–5.81)
RDW: 17.4 % — ABNORMAL HIGH (ref 11.5–15.5)
WBC: 7.8 10*3/uL (ref 4.0–10.5)
nRBC: 0.3 % — ABNORMAL HIGH (ref 0.0–0.2)

## 2018-09-22 MED ORDER — ATORVASTATIN CALCIUM 40 MG PO TABS
40.0000 mg | ORAL_TABLET | Freq: Every day | ORAL | Status: DC
  Administered 2018-09-22 – 2018-09-27 (×6): 40 mg via ORAL
  Filled 2018-09-22 (×6): qty 1

## 2018-09-22 MED ORDER — METOPROLOL TARTRATE 25 MG PO TABS
25.0000 mg | ORAL_TABLET | Freq: Two times a day (BID) | ORAL | Status: DC
  Administered 2018-09-22 – 2018-09-26 (×9): 25 mg via ORAL
  Filled 2018-09-22 (×11): qty 1

## 2018-09-22 NOTE — Progress Notes (Signed)
CSW received message from [place], [place] requesting a call be made to the patient's ex wife and caregiver regarding updates for discharge planning. CSW spoke with Tally Due at [phone] to discuss this patient's discharge plan. Mae reports that she and the patient have been separated for several years but that they are neighbors and she is his caregiver. Mae requested information on bed offers and agreed to receive them via email. CSW sent over the list of facilities that have offered the patient a bed which includes [facility], [facility], [facility] [place], and [facility]. Mae reports that the patient's monthly income is SSI and totals $989.   Madilyn Fireman, MSW, LCSW-A Clinical Social Worker [facility] [phone]

## 2018-09-22 NOTE — Plan of Care (Signed)
  Problem: Clinical Measurements: Goal: Respiratory complications will improve Outcome: Progressing  Pt is able to maintain O2 saturations greater than 90% during this shift.  Problem: Coping: Goal: Level of anxiety will decrease Outcome: Progressing  Pt is still anxious about discharge planning. He states his "caretaker" is his ex-wife and she recently told him that she will not be able to help him any longer and he will need to go to a nursing home.  Problem: Elimination: Goal: Will not experience complications related to urinary retention Outcome: Progressing  Pt having dysuria when voiding, however pt is not having any issues with urinary retention. UOP is adequate during this shift.

## 2018-09-22 NOTE — Consult Note (Addendum)
Cardiology Consultation:   Patient ID: Adrian Andrews MRN: [id number]; DOB: 1950/07/17  Admit date: 09/10/2018 Date of Consult: 09/22/2018  Primary Care Provider: [name], MD Primary Cardiologist:New to Dr. Percival Spanish    Patient Profile:   Adrian Andrews is a 68 y.o. male with a hx of hypertension, hyperlipidemia, carotid artery disease status post left carotid endarterectomy, COPD, stroke, diabetes mellitus and prior tobacco smoker (quit 04/2018)  who is being seen today for the evaluation of elevated troponin at the request of Dr. Nevada Crane.   History of Present Illness:   Adrian Andrews presented 8/27 with few weeks history of productive cough, shortness of breath, fever and diarrhea.  He was admitted for sepsis secondary to right lower lobe pneumonia and acute kidney injury.  He was treated with broad spectrum antibiotic.  COVID negative.  Hospital course complicated by severe hypoxic respiratory failure secondary to cavitary pneumonia.  He was evaluated by critical care team.  Patient was evaluated by infectious disease team secondary to slow response to pneumonia.  Repeat COVID negative.  Further complication due to drop in hemoglobin to 7.2.  He required blood transfusion and blood warming.  Hospital course further complicated [id number] due to 27 beats of nonsustained VT.  He was asymptomatic.  EKG revealed ST depression in lead V4 to V6.  Since then troponin gradually trending up 61>>85>>82>>131.  He denies chest pain or shortness of breath.  Repeat EKG with resolution of ST segment.  Patient denies prior cardiac history.  He was walking on a treadmill 30 minutes multiple times per week without any issues.  He smoked 2 packs of cigarettes for about 50 years, quit April 2020.  Denies chest pain, orthopnea, PND, syncope, lower extremity edema.  Mother and sister had MI in their mid 62s.  Echocardiogram 09/14/2018 showed LV function of 55 to 60%, pseudonormalization of diastolic parameters, no  wall motion abnormality, RV pressure/volume overload.   Heart Pathway Score:     Past Medical History:  Diagnosis Date   Anxiety    Arthritis    back, right hip   Chronic kidney disease    stage 3   COPD (chronic obstructive pulmonary disease) (HCC)    on 3L home O2   Diabetes mellitus without complication (HCC)    Dyspnea    Heart murmur    Hyperlipidemia    Hypertension    Neuropathy    Peripheral vascular disease (HCC)    Carotid artery stenosis, s/p bilat stents   Skin cancer    basal cell right cheek, left ear   Stroke [facility])    noted on MRI    Past Surgical History:  Procedure Laterality Date   ENDARTERECTOMY Left 02/21/2017   Procedure: LEFT CAROTID ENDARTERECTOMY;  Surgeon: Rosetta Posner, MD;  Location: [facility] OR;  Service: Vascular;  Laterality: Left;   lower extremity stenting     PATCH ANGIOPLASTY Left 02/21/2017   Procedure: PATCH ANGIOPLASTY OF LEFT CAROTID ARTERY USING HEMASHIELD PLATINUM FINESSE PATCH;  Surgeon: Rosetta Posner, MD;  Location: [facility] OR;  Service: Vascular;  Laterality: Left;      Inpatient Medications: Scheduled Meds:  aspirin EC  81 mg Oral Daily   atorvastatin  20 mg Oral QHS   Chlorhexidine Gluconate Cloth  6 each Topical Daily   enoxaparin (LOVENOX) injection  40 mg Subcutaneous Daily   fluticasone  2 spray Each Nare Daily   gabapentin  300 mg Oral q morning - 10a   gabapentin  600 mg  Oral QHS   guaiFENesin  600 mg Oral BID   insulin aspart  0-15 Units Subcutaneous TID WC   insulin aspart  0-5 Units Subcutaneous QHS   insulin detemir  8 Units Subcutaneous BID   ipratropium  0.5 mg Inhalation TID   levalbuterol  1.25 mg Nebulization TID   lidocaine  1 patch Transdermal Q24H   mouth rinse  15 mL Mouth Rinse BID   pantoprazole  40 mg Oral BID   phenazopyridine  100 mg Oral TID WC   senna-docusate  1 tablet Oral QHS   sertraline  50 mg Oral Daily   traZODone  50 mg Oral QHS   Continuous Infusions:   azithromycin Stopped (09/21/18 1812)   ceFEPime (MAXIPIME) IV Stopped (09/22/18 1005)   phenylephrine (NEO-SYNEPHRINE) Adult infusion Stopped (09/19/18 0939)   PRN Meds: albuterol, alum & mag hydroxide-simeth, lip balm, loperamide, LORazepam, ondansetron (ZOFRAN) IV, polyethylene glycol, sodium chloride, traMADol  Allergies:    Allergies  Allergen Reactions   Clindamycin/Lincomycin     Throat tightens up   Contrast Media [Iodinated Diagnostic Agents] Shortness Of Breath    11/20/2004 reaction with CTA CAP   Metformin And Related Other (See Comments)    Effected kidney function    Social History:   Social History   Socioeconomic History   Marital status: Single    Spouse name: Not on file   Number of children: 2   Years of education: Not on file   Highest education level: Not on file  Occupational History    Comment: retired  Scientist, product/process development strain: Patient refused   Food insecurity    Worry: Patient refused    Inability: Patient refused   Transportation needs    Medical: No    Non-medical: No  Tobacco Use   Smoking status: Former Smoker    Packs/day: 2.00    Years: 60.00    Pack years: 120.00    Types: Cigarettes    Quit date: 04/2018    Years since quitting: 0.4   Smokeless tobacco: Never Used  Substance and Sexual Activity   Alcohol use: No    Comment: heavy drinker in past, none for 20 years   Drug use: No   Sexual activity: Not Currently  Lifestyle   Physical activity    Days per week: Patient refused    Minutes per session: Patient refused   Stress: Only a little  Relationships   Press photographer on phone: Patient refused    Gets together: Patient refused    Attends religious service: Patient refused    Active member of club or organization: Patient refused    Attends meetings of clubs or organizations: Patient refused    Relationship status: Patient refused   Intimate partner violence    Fear of  current or ex partner: No    Emotionally abused: No    Physically abused: No    Forced sexual activity: No  Other Topics Concern   Not on file  Social History Narrative   Not on file    Family History:   As above  ROS:  Please see the history of present illness.  All other ROS reviewed and negative.     Physical Exam/Data:   Vitals:   09/22/18 1200 09/22/18 1449 09/22/18 1542 09/22/18 1546  BP: (!) 154/65  136/68   Pulse: 95  (!) 112 (!) 110  Resp: (!) 25  (!) 22   Temp:  98 F (36.7 C)   TempSrc:   Oral   SpO2: 97% 91% 92%   Weight:   73.5 kg   Height:   5\' 9"  (1.753 m)     Intake/Output Summary (Last 24 hours) at 09/22/2018 1604 Last data filed at 09/22/2018 1200 Gross per 24 hour  Intake 2136 ml  Output 3600 ml  Net -1464 ml   Last 3 Weights 09/22/2018 09/21/2018 09/20/2018  Weight (lbs) 162 lb 1.6 oz 180 lb 12.4 oz 164 lb 3.9 oz  Weight (kg) 73.528 kg 82 kg 74.5 kg     Body mass index is 23.94 kg/m.  General:  Well nourished, well developed, in no acute distress HEENT: normal Lymph: no adenopathy Neck: no JVD Endocrine:  No thryomegaly Vascular: No carotid bruits; FA pulses 2+ bilaterally without bruits  Cardiac:  normal S1, S2; RRR; no murmur  Lungs: Course breath sound Abd: soft, nontender, no hepatomegaly  Ext: no edema Musculoskeletal:  No deformities, BUE and BLE strength normal and equal Skin: warm and dry  Neuro:  CNs 2-12 intact, no focal abnormalities noted Psych:  Normal affect   EKG:  The EKG 08/21/18  was personally reviewed and demonstrates: Tachycardia at rate of 104 bpm, ST depression in lead V3 to V5 Telemetry:  Telemetry was personally reviewed and demonstrates: Sinus tachycardia at rate of 100s; 27 beats of NSVT  Relevant CV Studies:  Echo 09/14/2018  1. The left ventricle has normal systolic function, with an ejection fraction of 55-60%. The cavity size was normal. Left ventricular diastolic Doppler parameters are consistent with  pseudonormalization. No evidence of left ventricular regional wall  motion abnormalities.  2. Mild calcification of the mitral valve leaflet. There is moderate mitral annular calcification present. No evidence of mitral valve stenosis. No significant mitral regurgitation.  3. The aortic valve is tricuspid. Mild calcification of the aortic valve. No stenosis of the aortic valve.  4. The aortic root is normal in size and structure.  5. The right ventricle has mildly reduced systolic function. The cavity was mildly enlarged. There is no increase in right ventricular wall thickness. Mildly D-shaped interventricular septum suggests a degree of RV pressure/volume overload.  6. The inferior vena cava was dilated in size with >50% respiratory variability. No complete TR doppler jet so unable to estimate PA systolic pressure.  Laboratory Data:  High Sensitivity Troponin:   Recent Labs  Lab 09/21/18 0751 09/21/18 1506 09/21/18 1634 09/22/18 0708  TROPONINIHS 61* 85* 82* 131*     Chemistry Recent Labs  Lab 09/20/18 0416 09/21/18 0542 09/22/18 0708  NA 133* 132* 133*  K 3.8 3.8 4.5  CL 98 96* 100  CO2 27 27 24   GLUCOSE 196* 153* 145*  BUN 12 11 10   CREATININE 1.11 1.28* 1.15  CALCIUM 8.2* 8.0* 8.5*  GFRNONAA >60 57* >60  GFRAA >60 >60 >60  ANIONGAP 8 9 9     Recent Labs  Lab 09/17/18 0618 09/18/18 0536 09/19/18 0639  PROT 4.8* 4.6* 5.3*  ALBUMIN 1.9* 1.8* 2.4*  AST 14* 17 20  ALT 23 22 24   ALKPHOS 66 62 67  BILITOT 3.0* 1.8* 2.1*   Hematology Recent Labs  Lab 09/20/18 0416 09/21/18 0542 09/22/18 0116 09/22/18 0708  WBC 10.7* 7.2  --  7.8  RBC 2.35* 2.13*  --  2.88*  HGB 7.8* 7.2* 9.6* 10.2*  HCT 23.4* 21.5* 27.7* 28.4*  MCV 99.6 100.9*  --  98.6  MCH 33.2 33.8  --  35.4*  MCHC 33.3 33.5  --  35.9  RDW 16.7* 17.1*  --  17.4*  PLT 327 284  --  258    DDimer  Recent Labs  Lab 09/18/18 1403  DDIMER 2.36*     Radiology/Studies:  Dg Chest Port 1 View  Result  Date: 09/20/2018 CLINICAL DATA:  Pneumonia.  History of COPD. EXAM: PORTABLE CHEST 1 VIEW COMPARISON:  Single-view of the chest 09/18/2018 and 09/13/2018. CT chest 09/13/2018. FINDINGS: The lungs are emphysematous. Right basilar airspace disease persists but has improved since the most recent comparison. Mild opacity in left lung base noted. No pneumothorax. Small right pleural effusion. Heart size is normal. Aortic atherosclerosis. IMPRESSION: Improved right lower lobe pneumonia with associated small right pleural effusion. Minimal left basilar opacity also noted. Emphysema. Electronically Signed   By: Inge Rise M.D.   On: 09/20/2018 08:14   Dg Swallowing Func-speech Pathology  Result Date: 09/19/2018 Objective Swallowing Evaluation: Type of Study: MBS-Modified Barium Swallow Study  Patient Details Name: Adrian Andrews MRN: [id number] Date of Birth: [birth year] Today's Date: 09/19/2018 Time: SLP Start Time (ACUTE ONLY): 1215 -SLP Stop Time (ACUTE ONLY): [id number] SLP Time Calculation (min) (ACUTE ONLY): 30 min Past Medical History: Past Medical History: Diagnosis Date  Anxiety   Arthritis   back, right hip  Chronic kidney disease   stage 3  COPD (chronic obstructive pulmonary disease) (HCC)   on 3L home O2  Diabetes mellitus without complication (HCC)   Dyspnea   Heart murmur   Hyperlipidemia   Hypertension   Neuropathy   Peripheral vascular disease ([place])   Carotid artery stenosis, s/p bilat stents  Skin cancer   basal cell right cheek, left ear  Stroke ([place])   noted on MRI Past Surgical History: Past Surgical History: Procedure Laterality Date  ENDARTERECTOMY Left 02/21/2017  Procedure: LEFT CAROTID ENDARTERECTOMY;  Surgeon: Rosetta Posner, MD;  Location: [facility] OR;  Service: Vascular;  Laterality: Left;  lower extremity stenting    PATCH ANGIOPLASTY Left 02/21/2017  Procedure: PATCH ANGIOPLASTY OF LEFT CAROTID ARTERY USING [place];  Surgeon: Rosetta Posner, MD;  Location: [place] [facility];   Service: Vascular;  Laterality: Left; HPI: Adrian Andrews is a 68 y.o. male admitted 09/10/2018 with fever, SOB, and productive cough. PMH: CVA, carotid stenosis s/p B stents; HTN; HLD; DM; COPD on 3L home O2; stage 3 CKD, chronic back pain; No prior SLP intervention found. CXR = RLL PNA.  Subjective: alert and cooperative Assessment / Plan / Recommendation CHL IP CLINICAL IMPRESSIONS 09/19/2018 Clinical Impression MBS completed, pt on 3L [place] and respiratory status is stable at this time. His voice is clear and cough is strong. Pt had good bolus control, tongue elevation and complete mastication with soft solids.  His swallow initiation was timely with complete laryngeal elevation, epiglottic inversion and laryngeal closure. A cervical osteophyte at C5-C6 noted to slightly impeed clearing of large liquid boluses. Esophageal sweep with barium tablet revealed complete clearing of esophagus. Recommend Reg diet with thin liquids. Medication whole with liquids.  SLP Visit Diagnosis Dysphagia, unspecified (R13.10) Attention and concentration deficit following -- Frontal lobe and executive function deficit following -- Impact on safety and function Mild aspiration risk   CHL IP TREATMENT RECOMMENDATION 09/19/2018 Treatment Recommendations Therapy as outlined in treatment plan below   Prognosis 09/19/2018 Prognosis for Safe Diet Advancement Good Barriers to Reach Goals -- Barriers/Prognosis Comment -- CHL IP DIET RECOMMENDATION 09/19/2018 SLP Diet Recommendations Regular solids;Thin liquid Liquid Administration via Cup;Straw  Medication Administration Whole meds with liquid Compensations Slow rate;Small sips/bites;Other (Comment) Postural Changes Seated upright at 90 degrees   CHL IP OTHER RECOMMENDATIONS 09/19/2018 Recommended Consults -- Oral Care Recommendations Oral care BID Other Recommendations --   CHL IP FOLLOW UP RECOMMENDATIONS 09/19/2018 Follow up Recommendations None   CHL IP FREQUENCY AND DURATION 09/19/2018 Speech Therapy  Frequency (ACUTE ONLY) min 2x/week Treatment Duration 1 week      CHL IP ORAL PHASE 09/19/2018 Oral Phase WFL Oral - Pudding Teaspoon -- Oral - Pudding Cup -- Oral - Honey Teaspoon -- Oral - Honey Cup -- Oral - Nectar Teaspoon -- Oral - Nectar Cup -- Oral - Nectar Straw -- Oral - Thin Teaspoon -- Oral - Thin Cup -- Oral - Thin Straw -- Oral - Puree -- Oral - Mech Soft -- Oral - Regular -- Oral - Multi-Consistency -- Oral - Pill -- Oral Phase - Comment --  CHL IP PHARYNGEAL PHASE 09/19/2018 Pharyngeal Phase WFL Pharyngeal- Pudding Teaspoon -- Pharyngeal -- Pharyngeal- Pudding Cup -- Pharyngeal -- Pharyngeal- Honey Teaspoon -- Pharyngeal -- Pharyngeal- Honey Cup -- Pharyngeal -- Pharyngeal- Nectar Teaspoon -- Pharyngeal -- Pharyngeal- Nectar Cup -- Pharyngeal -- Pharyngeal- Nectar Straw -- Pharyngeal -- Pharyngeal- Thin Teaspoon -- Pharyngeal -- Pharyngeal- Thin Cup -- Pharyngeal -- Pharyngeal- Thin Straw -- Pharyngeal -- Pharyngeal- Puree -- Pharyngeal -- Pharyngeal- Mechanical Soft -- Pharyngeal -- Pharyngeal- Regular -- Pharyngeal -- Pharyngeal- Multi-consistency -- Pharyngeal -- Pharyngeal- Pill -- Pharyngeal -- Pharyngeal Comment --  CHL IP CERVICAL ESOPHAGEAL PHASE 09/19/2018 Cervical Esophageal Phase WFL Pudding Teaspoon -- Pudding Cup -- Honey Teaspoon -- Honey Cup -- Nectar Teaspoon -- Nectar Cup -- Nectar Straw -- Thin Teaspoon -- Thin Cup -- Thin Straw -- Puree -- Mechanical Soft -- Regular -- Multi-consistency -- Pill -- Cervical Esophageal Comment -- Charlynne Cousins Ward, MA, CCC-SLP 09/19/2018 1:35 PM               Assessment and Plan:   1. Elevated troponin - Hs troponin trending up 61>>85>>82>>131 after episode of NSVT yesterday. He is asymptomatic. EKG yesterday showed ST depression in lead V3 to V5 which resolved today.  -Echocardiogram 8/31 showed preserved LV function without wall motion abnormality. -Patient was walking on treadmill 30 minutes multiple times per week without any issue prior to  presentation. -Likely demand ischemia in setting of tachycardia and anemia.  Will need ischemic evaluation at one point.  Inpatient versus outpatient.  Will review with MD. - Continue ASA and statin. Check lipid panel. Will continue to cycle enzyme. Stress test tomorrow.   2. NSVT (27 beats) -Asymptomatic.  No recurrence.  Potassium stable. Check Magnesium - Keep K above 4 and Mg above 2.  - Consider adding BB (home atenolol on hold). Will add metoprolol 25mg  BID.  - BP stable.  3.  Community-acquired pneumonia -Resolving -Per primary team  4.  Acute blood loss anemia -Status post transfusion of 1 unit of PRBCs>> Hgb improved to 10.2 today - Work up per primary team   5. DM - Per primary team    For questions or updates, please contact [facility] [place] Please consult [email or web address] for contact info under    Jarrett Soho, PA  09/22/2018 4:04 PM   History and all data above reviewed.  Patient examined.  I agree with the findings as above.  The patient lives by himself and his ex wife check on him.  He says he gets around [place] and despite COPD does not use O2  at home.  He denies chest pain or prior cardiac history.  He does have carotid stenosis.  He had NSVT yesterday and had some ST depression transiently on the lateral leads.  He does not report chest pain or other anginal equivalent.  However, his hsTrop is minimally elevated.  The patient exam reveals COR:RRR  ,  Lungs: Decreased breath sounds  ,  Abd: Positive bowel sounds, no rebound no guarding, Ext no edema  .  All available labs, radiology testing, previous records reviewed. Agree with documented assessment and plan. NSVT:  NL LV function on echo.  Plan Lexiscan Myoview given the ST changes transiently and the NSVT as well as his history of PVD.  We are screening for high risk cardiovascular findings.  Otherwise he needs aggressive risk reduction.  I will increase the Lipitor.    Jeneen Rinks Abagail Limb  4:43 PM  09/22/2018

## 2018-09-22 NOTE — Progress Notes (Signed)
CRITICAL VALUE ALERT  Critical Value:  Troponin 131  Date & Time Notied:  09/22/18 8:27 AM  Provider Notified: Dr. Nevada Crane  Orders Received/Actions taken: No further orders at this time

## 2018-09-22 NOTE — Progress Notes (Addendum)
PROGRESS NOTE  Adrian Andrews [id number] DOB: Sep 21, 1950 DOA: 09/10/2018 PCP: [name], MD  HPI/Recap of past 1 hours: 68 year old male with history of CVA, carotid artery stenosis/stent, DM-2, CKD 3, HTN, HLD, COPD-3L and former smoker presenting with fever, productive cough, shortness of breath and diarrhea and admitted for sepsis due to RLL pneumonia, and AKI.  In ED, soft blood pressures.  Lactic acidosis.  CXR revealed RLL pneumonia.  COVID-19 negative. Started on ceftriaxone, azithromycin and a steroid.  PCCM consulted given soft blood pressure and uptrending lactic acidosis and recommended hydration.  Hospital course complicated by severe hypoxia requiring high flow nasal cannula.  CT chest done without contrast due to allergy showed no clear evidence of pulmonary embolism.  Revealed bilateral pulmonary infiltrates with increase in pulmonary vascularity.  Started on IV Lasix with improvement of hypoxia.  Elevated inflammatory markers with suspicion for COVID-19.  Repeated in-house COVID-19 negative.  2D echo done on 09/14/2018 unrevealing.  Showing normal LVEF 55 to 60%.  Hospital course complicated by respiratory distress on 09/18/2018 and severe hypotension requiring pressors secondary to septic shock in the setting of cavitary pneumonia.  Transferred to ICU on 09/18/2018.  Also anemia with drop in hemoglobin to 7.2.  Patient found to cold agglutinin antibodies requiring blood warming, therefore started to the ICU on 09/21/2018 for blood transfusion.  Sustained V. tach reported on 09/21/2010 EKG reveals ST depressions on V4 V5 and V6.  Troponin elevated and trended up.  Denies anginal symptoms.  Cardiology paged.  09/22/18: Patient seen and examined at his bedside in the ICU.  He denies chest pain, dyspnea or palpitations.  Troponin S continue to trend up to 136.  Twelve-lead EKG obtained.  Sinus rhythm with nonspecific ST-T changes.  Cardiology consulted due to worsening elevated  troponin.    Assessment/Plan: Principal Problem:   Severe sepsis (HCC) Active Problems:   Acute respiratory failure with hypoxia (HCC)   COPD (chronic obstructive pulmonary disease) (HCC)   Type 2 diabetes mellitus with other specified complication (HCC)   CKD (chronic kidney disease) stage 3, GFR 30-59 ml/min (HCC)   RLL pneumonia (HCC)   Essential hypertension   Acute on chronic diastolic CHF (congestive heart failure) ([place])   Hyperbilirubinemia   Septic shock (HCC)   Resolving septic shock secondary to severe community-acquired pneumonia  Presented with tachypnea and tachycardia with right lower lobe infiltrates on chest x-ray done on 09/10/2018.  Procalcitonin 7.44>> 5.25>> 2.19>> 0.47 on 09/18/18 COVID-19 negative x2 during this admission MRSA screening negative Sputum culture consistent with normal flora Day #13 of 14 of IV antibiotics, prolonged IV antibiotic due to slow to respond severe community-acquired pneumonia.  Blood cultures drawn on 09/10/2018 final negative  Urine culture drawn on 09/16/2018 final negative Repeat blood cultures peripherally x2 on 09/19/2018 negative x2 days AFB sent and pending Sputum culture reintubated for better growth, Gram stain showing rare gram-positive cocci and rare yeast Started on meropenem from 09/18/2018 with pressors IV azithromycin re-added on 09/19/2018 Seen by infectious disease, following. Off pressors since 09/19/2018 Has been maintaining map greater than 65 without pressors for 3 days  Elevated troponin, suspect demand ischemia from tachycardia Trending up; peaked at 136 Asymptomatic, denies anginal symptoms Twelve-lead EKG done on 09/22/2018 reviewed and unrevealing Continue to closely monitor on telemetry Cardiology has been consulted to further evaluate  Nonsustained V. tach, asymptomatic Denies any anginal symptoms Continue to closely monitor on telemetry Twelve-lead EKG done this morning 09/21/2018 showed ST depression V4 V5 V6  Elevated troponin 61 trending down Troponin S peaked at 136 Cardiology paged for consult 09/21/18 and 09/22/2018.  Acute blood loss in the setting of cold agglutinin antibodies Hemoglobin dropped to 7.2 on 09/21/18 Transfused 1 unit pRBC 9/7 with blood warming FOBT x2, not completed FOBT ordered once more Iron studies unrevealing Hg 10.2 on 09/22/18  Acute on chronic hypoxic respiratory failure suspect multifactorial secondary to cavitary CAP, acute on chronic diastolic CHF, right pleural effusion, ?aspiration On 2 L of oxygen as needed at baseline Increase in oxygen requirement on 09/18/18 HFNC >> Venturi mask Pulmonology reconsulted on 09/18/2018 ?aspiration, seen by speech therapy  Continue bronchodilators Continue to maintain O2 saturation greater than 92% PCCM has been consulted to assist with the management Currently on 3 L nasal cannula maintaining saturation in the mid 90s  LUTS Urine culture done on 09/16/2018 no growth, final Started on Pyridium 3 days  Right pleural effusion suspect infective from CAP Last 2D echo normal LVEF Off diuretic due to soft BP Continue meropenem and IV azithromycin, complete 14 days of IV antibiotics on 09/23/2018. Personally reviewed chest x-ray done on 09/20/2018, right mid, right lower lobe infiltrates with small right pleural effusion  Suspected dysphagia Seen by speech therapy with recommendations Aspiration precautions  Hypomagnesemia Magnesium 1.7 Replete as indicated  Resolving hypotension No longer requiring pressors Maintain map greater than 65  Cavitary community-acquired pneumonia, POA Management as stated above Mucinex for cough as needed Pulmonary toilet as needed Continue flutter valve as tolerated AFB ordered, seen by infectious disease, signed off on 09/21/2018.  Chronic low back pain Continue lidocaine patch  Elevated d-dimer D-dimer 11 on 09/13/2018>> 2.3 on 09/18/18 No evidence of PE on CT chest no contrast Negative DVT in  lower extremities bilaterally COVID 19-x2 during this admission  Hypotensive in the setting of septic shock.  Home atenolol has been held  Significantly elevated BNP with concern for acute on chronic diastolic CHF BNP from 123456 on 02/13/2018 to greater than 2300 on 09/13/2018 2D echo done on 09/14/2018 showed normal LVEF 55 to 60%.  IV Lasix 40 mg daily Net I&O -23.3 L since admission Continue strict I's and O's and daily weight  Hypokalemia/hypophosphatemia/hypomagnesemia Replete as indicated  Resolving isolated hyperbilirubinemia, unclear etiology Total bilirubin 2.3 on 09/15/18>>3.0 on 09/17/18 >>1.8 on 09/18/18>> 2.1 on 09/19/18 Alkaline phosphatase, AST and ALT normal  Resolving euvolemic hyponatremia Na+ 134 >> 132 on 09/19/18>> 133 on 09/20/2018. Continue to monitor  AKI on CKD 2 Baseline creatinine 1.1 with GFR greater than 60  Creatinine at baseline 1.1 GFR greater than 60 on 09/20/2018. Continue to avoid nephrotoxins and continue to monitor urine output  Resolved mild thrombocytopenia Presented with platelet count of 111 Platelet count 290 on 09/18/2018  Prior polysubstance abuse with benzodiazepine and cocaine Last UDS on 05/04/2016+ for benzodiazepine and cocaine No repeated UDS during this admission  Chronic anxiety/depression Resume Zoloft, trazodone, Neurontin when hemodynamically stable  COPD Continue inhalers  Type 2 diabetes with hyperglycemia Hemoglobin A1c 7.6 on 09/10/2018 Resume Levemir and continue insulin sliding scale Avoid hypoglycemia  Physical debility/ambulatory dysfunction PT OT assessment recommended SNF CSW consulted for assistance in SNF placement Continue fall precautions Ambulate every shift as tolerated  Code Status: DNR  Family Communication: None at bedside  Disposition Plan: Patient is not appropriate for discharge at this time due to elevated troponin, awaiting cardiology evaluation in the setting of multiple comorbidities and advanced age.    Consultants:  PCCM  Infectious disease on 09/19/2018.  Cardiology  Procedures:  None  Antimicrobials:  Meropenem  DVT prophylaxis: Subcu Lovenox daily.   Objective: Vitals:   09/22/18 0900 09/22/18 0911 09/22/18 1000 09/22/18 1100  BP:  110/64 126/60 108/69  Pulse: (!) 113 (!) 114 (!) 116 (!) 108  Resp: (!) 21 (!) 22 13 (!) 26  Temp:    98.4 F (36.9 C)  TempSrc:    Oral  SpO2: 91% (!) 85% (!) 89% 90%  Weight:      Height:        Intake/Output Summary (Last 24 hours) at 09/22/2018 1224 Last data filed at 09/22/2018 1100 Gross per 24 hour  Intake 1978 ml  Output 3750 ml  Net -1772 ml   Filed Weights   09/19/18 0500 09/20/18 0500 09/21/18 0500  Weight: 79 kg 74.5 kg 82 kg    Exam:  . General: 68 y.o. year-old male frail-appearing no acute distress.  Alert oriented x3.   . Cardiovascular: Regular rate and rhythm no rubs or gallops murmurs or thyromegaly noted.   Marland Kitchen Respiratory: Clear to auscultation no wheezes or rales.  Poor inspiratory effort.  .  Abdomen: Nontender nondistended with bowel sounds present. . Musculoskeletal: No lower extremity edema.  Peripheral pulses in all 4 extremities. Marland Kitchen Psychiatry: Mood is appropriate for condition and setting.    Data Reviewed: CBC: Recent Labs  Lab 09/18/18 0536  09/19/18 0639 09/20/18 0416 09/21/18 0542 09/22/18 0116 09/22/18 0708  WBC 13.7*  --  12.9* 10.7* 7.2  --  7.8  NEUTROABS  --   --  12.2*  --   --   --   --   HGB 7.1*   < > 7.3* 7.8* 7.2* 9.6* 10.2*  HCT 21.0*   < > 22.8* 23.4* 21.5* 27.7* 28.4*  MCV 95.5  --  100.0 99.6 100.9*  --  98.6  PLT 290  --  358 327 284  --  258   < > = values in this interval not displayed.   Basic Metabolic Panel: Recent Labs  Lab 09/18/18 0536 09/19/18 0639 09/20/18 0416 09/21/18 0542 09/22/18 0708  NA 134* 132* 133* 132* 133*  K 3.7 3.8 3.8 3.8 4.5  CL 99 95* 98 96* 100  CO2 28 26 27 27 24   GLUCOSE 141* 177* 196* 153* 145*  BUN 20 17 12 11 10    CREATININE 1.15 1.23 1.11 1.28* 1.15  CALCIUM 7.8* 8.1* 8.2* 8.0* 8.5*  MG  --  1.7  --   --   --   PHOS  --  2.4*  --   --   --    GFR: Estimated Creatinine Clearance: 60.6 mL/min (by C-G formula based on SCr of 1.15 mg/dL). Liver Function Tests: Recent Labs  Lab 09/16/18 0416 09/17/18 0618 09/18/18 0536 09/19/18 0639  AST 17 14* 17 20  ALT 26 23 22 24   ALKPHOS 63 66 62 67  BILITOT 3.1* 3.0* 1.8* 2.1*  PROT 4.7* 4.8* 4.6* 5.3*  ALBUMIN 1.6* 1.9* 1.8* 2.4*   No results for input(s): LIPASE, AMYLASE in the last 168 hours. No results for input(s): AMMONIA in the last 168 hours. Coagulation Profile: No results for input(s): INR, PROTIME in the last 168 hours. Cardiac Enzymes: No results for input(s): CKTOTAL, CKMB, CKMBINDEX, TROPONINI in the last 168 hours. BNP (last 3 results) No results for input(s): PROBNP in the last 8760 hours. HbA1C: No results for input(s): HGBA1C in the last 72 hours. CBG: Recent Labs  Lab 09/21/18 1637 09/21/18 1843 09/21/18  2118 09/22/18 0733 09/22/18 1132  GLUCAP 151* 171* 141* 136* 183*   Lipid Profile: No results for input(s): CHOL, HDL, LDLCALC, TRIG, CHOLHDL, LDLDIRECT in the last 72 hours. Thyroid Function Tests: No results for input(s): TSH, T4TOTAL, FREET4, T3FREE, THYROIDAB in the last 72 hours. Anemia Panel: No results for input(s): VITAMINB12, FOLATE, FERRITIN, TIBC, IRON, RETICCTPCT in the last 72 hours. Urine analysis:    Component Value Date/Time   COLORURINE YELLOW 09/16/2018 [place] 09/16/2018 1715   LABSPEC 1.006 09/16/2018 1715   PHURINE 6.0 09/16/2018 1715   GLUCOSEU NEGATIVE 09/16/2018 1715   HGBUR MODERATE (A) 09/16/2018 1715   BILIRUBINUR NEGATIVE 09/16/2018 1715   BILIRUBINUR neg 09/10/2013 1308   KETONESUR NEGATIVE 09/16/2018 1715   PROTEINUR NEGATIVE 09/16/2018 1715   UROBILINOGEN 0.2 09/10/2013 1308   NITRITE NEGATIVE 09/16/2018 1715   LEUKOCYTESUR NEGATIVE 09/16/2018 1715   Sepsis  Labs: @LABRCNTIP (procalcitonin:4,lacticidven:4)  ) Recent Results (from the past 240 hour(s))  SARS Coronavirus 2 [facility] order, Performed in [place] hospital lab)     Status: None   Collection Time: 09/13/18  3:43 PM  Result Value Ref Range Status   SARS Coronavirus 2 NEGATIVE NEGATIVE Final    Comment: (NOTE) If result is NEGATIVE SARS-CoV-2 target nucleic acids are NOT DETECTED. The SARS-CoV-2 RNA is generally detectable in upper and lower  respiratory specimens during the acute phase of infection. The lowest  concentration of SARS-CoV-2 viral copies this assay can detect is 250  copies / mL. A negative result does not preclude SARS-CoV-2 infection  and should not be used as the sole basis for treatment or other  patient management decisions.  A negative result may occur with  improper specimen collection / handling, submission of specimen other  than nasopharyngeal swab, presence of viral mutation(s) within the  areas targeted by this assay, and inadequate number of viral copies  (<250 copies / mL). A negative result must be combined with clinical  observations, patient history, and epidemiological information. If result is POSITIVE SARS-CoV-2 target nucleic acids are DETECTED. The SARS-CoV-2 RNA is generally detectable in upper and lower  respiratory specimens dur ing the acute phase of infection.  Positive  results are indicative of active infection with SARS-CoV-2.  Clinical  correlation with patient history and other diagnostic information is  necessary to determine patient infection status.  Positive results do  not rule out bacterial infection or co-infection with other viruses. If result is PRESUMPTIVE POSTIVE SARS-CoV-2 nucleic acids MAY BE PRESENT.   A presumptive positive result was obtained on the submitted specimen  and confirmed on repeat testing.  While 2019 novel coronavirus  (SARS-CoV-2) nucleic acids may be present in the submitted sample  additional  confirmatory testing may be necessary for epidemiological  and / or clinical management purposes  to differentiate between  SARS-CoV-2 and other Sarbecovirus currently known to infect humans.  If clinically indicated additional testing with an alternate test  methodology [phone]) is advised. The SARS-CoV-2 RNA is generally  detectable in upper and lower respiratory sp ecimens during the acute  phase of infection. The expected result is Negative. Fact Sheet for Patients:  StrictlyIdeas.no Fact Sheet for Healthcare Providers: BankingDealers.co.za This test is not yet approved or cleared by the [place] FDA and has been authorized for detection and/or diagnosis of SARS-CoV-2 by FDA under an Emergency Use Authorization (EUA).  This EUA will remain in effect (meaning this test can be used) for the duration of the COVID-19 declaration  under Section 564(b)(1) of the Act, 21 U.S.C. section 360bbb-3(b)(1), unless the authorization is terminated or revoked sooner. Performed at [facility], [place] [address] [place], [place] [address]   MRSA PCR Screening     Status: None   Collection Time: 09/13/18  6:11 PM   Specimen: Nasal Mucosa; Nasopharyngeal  Result Value Ref Range Status   MRSA by PCR NEGATIVE NEGATIVE Final    Comment:        The GeneXpert MRSA Assay (FDA approved for NASAL specimens only), is one component of a comprehensive MRSA colonization surveillance program. It is not intended to diagnose MRSA infection nor to guide or monitor treatment for MRSA infections. Performed at [facility], [place] [address], [place], [place] [address]   Respiratory Panel by PCR     Status: None   Collection Time: 09/13/18  6:12 PM   Specimen: Nasopharyngeal Swab; Respiratory  Result Value Ref Range Status   Adenovirus NOT DETECTED NOT DETECTED Final   Coronavirus 229E NOT DETECTED NOT DETECTED Final    Comment: (NOTE) The  Coronavirus on the Respiratory Panel, DOES NOT test for the novel  Coronavirus (2019 nCoV)    Coronavirus HKU1 NOT DETECTED NOT DETECTED Final   Coronavirus NL63 NOT DETECTED NOT DETECTED Final   Coronavirus OC43 NOT DETECTED NOT DETECTED Final   Metapneumovirus NOT DETECTED NOT DETECTED Final   Rhinovirus / Enterovirus NOT DETECTED NOT DETECTED Final   Influenza A NOT DETECTED NOT DETECTED Final   Influenza B NOT DETECTED NOT DETECTED Final   Parainfluenza Virus 1 NOT DETECTED NOT DETECTED Final   Parainfluenza Virus 2 NOT DETECTED NOT DETECTED Final   Parainfluenza Virus 3 NOT DETECTED NOT DETECTED Final   Parainfluenza Virus 4 NOT DETECTED NOT DETECTED Final   Respiratory Syncytial Virus NOT DETECTED NOT DETECTED Final   Bordetella pertussis NOT DETECTED NOT DETECTED Final   Chlamydophila pneumoniae NOT DETECTED NOT DETECTED Final   Mycoplasma pneumoniae NOT DETECTED NOT DETECTED Final    Comment: Performed at [facility], [place]. [address] [place], [place] [address]  Culture, Urine     Status: None   Collection Time: 09/16/18  5:49 PM   Specimen: Urine, Clean Catch  Result Value Ref Range Status   Specimen Description URINE, CLEAN CATCH  Final   Special Requests NONE  Final   Culture   Final    NO GROWTH Performed at [facility], [place] [address] [place],  [address]    Report Status 09/17/2018 FINAL  Final  Expectorated sputum assessment w rflx to resp cult     Status: None   Collection Time: 09/19/18 10:14 AM   Specimen: Sputum  Result Value Ref Range Status   Specimen Description SPUTUM  Final   Special Requests NONE  Final   Sputum evaluation   Final    THIS SPECIMEN IS ACCEPTABLE FOR SPUTUM CULTURE Performed at [facility], [place] [address], [place],  [address]    Report Status 09/19/2018 FINAL  Final  Culture, respiratory     Status: None   Collection Time: 09/19/18 10:14 AM   Specimen: SPU  Result Value Ref Range Status   Specimen  Description SPUTUM  Final   Special Requests NONE Reflexed from [id number]  Final   Gram Stain   Final    FEW WBC PRESENT, PREDOMINANTLY PMN RARE GRAM POSITIVE COCCI RARE YEAST    Culture   Final    Consistent with  normal respiratory flora. Performed at [facility], [place] [address] [place], [place] [address]    Report Status 09/21/2018 FINAL  Final  Culture, blood (routine x 2)     Status: None (Preliminary result)   Collection Time: 09/19/18 10:34 AM   Specimen: BLOOD LEFT ARM  Result Value Ref Range Status   Specimen Description BLOOD LEFT ARM  Final   Special Requests   Final    BOTTLES DRAWN AEROBIC ONLY Blood Culture results may not be optimal due to an inadequate volume of blood received in culture bottles   Culture   Final    NO GROWTH 3 DAYS Performed at [facility], [place] [address], [place], [place] [address]    Report Status PENDING  Incomplete  Culture, blood (routine x 2)     Status: None (Preliminary result)   Collection Time: 09/19/18 10:39 AM   Specimen: BLOOD LEFT HAND  Result Value Ref Range Status   Specimen Description BLOOD LEFT HAND  Final   Special Requests   Final    BOTTLES DRAWN AEROBIC ONLY Blood Culture results may not be optimal due to an inadequate volume of blood received in culture bottles   Culture   Final    NO GROWTH 3 DAYS Performed at [facility], [place] [address], [place], [place] [address]    Report Status PENDING  Incomplete      Studies: No results found.  Scheduled Meds: . aspirin EC  81 mg Oral Daily  . atorvastatin  20 mg Oral QHS  . Chlorhexidine Gluconate Cloth  6 each Topical Daily  . enoxaparin (LOVENOX) injection  40 mg Subcutaneous Daily  . fluticasone  2 spray Each Nare Daily  . gabapentin  300 mg Oral q morning - 10a  . gabapentin  600 mg Oral QHS  . guaiFENesin  600 mg Oral BID  . insulin aspart  0-15 Units Subcutaneous TID WC  . insulin aspart  0-5 Units Subcutaneous QHS  . insulin detemir  8 Units  Subcutaneous BID  . ipratropium  0.5 mg Inhalation TID  . levalbuterol  1.25 mg Nebulization TID  . lidocaine  1 patch Transdermal Q24H  . mouth rinse  15 mL Mouth Rinse BID  . pantoprazole  40 mg Oral BID  . phenazopyridine  100 mg Oral TID WC  . senna-docusate  1 tablet Oral QHS  . sertraline  50 mg Oral Daily  . traZODone  50 mg Oral QHS    Continuous Infusions: . azithromycin Stopped (09/21/18 1812)  . ceFEPime (MAXIPIME) IV Stopped (09/22/18 1005)  . phenylephrine (NEO-SYNEPHRINE) Adult infusion Stopped (09/19/18 0939)     LOS: 12 days     Kayleen Memos, MD [facility] Pager [phone]  If 7PM-7AM, please contact night-coverage [email or web address] Password [facility] 09/22/2018, 12:24 PM

## 2018-09-23 ENCOUNTER — Inpatient Hospital Stay (HOSPITAL_COMMUNITY): Payer: Medicare Other

## 2018-09-23 DIAGNOSIS — I472 Ventricular tachycardia: Secondary | ICD-10-CM

## 2018-09-23 DIAGNOSIS — R0789 Other chest pain: Secondary | ICD-10-CM

## 2018-09-23 LAB — NM MYOCAR MULTI W/SPECT W/WALL MOTION / EF
MPHR: 151 {beats}/min
Peak HR: 113 {beats}/min
Percent HR: 74 %
Rest HR: 100 {beats}/min

## 2018-09-23 LAB — CBC
HCT: 31.3 % — ABNORMAL LOW (ref 39.0–52.0)
Hemoglobin: 10.6 g/dL — ABNORMAL LOW (ref 13.0–17.0)
MCH: 33 pg (ref 26.0–34.0)
MCHC: 33.9 g/dL (ref 30.0–36.0)
MCV: 97.5 fL (ref 80.0–100.0)
Platelets: 261 10*3/uL (ref 150–400)
RBC: 3.21 MIL/uL — ABNORMAL LOW (ref 4.22–5.81)
RDW: 17.2 % — ABNORMAL HIGH (ref 11.5–15.5)
WBC: 7.9 10*3/uL (ref 4.0–10.5)
nRBC: 0 % (ref 0.0–0.2)

## 2018-09-23 LAB — GLUCOSE, CAPILLARY
Glucose-Capillary: 96 mg/dL (ref 70–99)
Glucose-Capillary: 231 mg/dL — ABNORMAL HIGH (ref 70–99)
Glucose-Capillary: 171 mg/dL — ABNORMAL HIGH (ref 70–99)
Glucose-Capillary: 276 mg/dL — ABNORMAL HIGH (ref 70–99)

## 2018-09-23 LAB — LIPID PANEL
Cholesterol: 84 mg/dL (ref 0–200)
HDL: 25 mg/dL — ABNORMAL LOW (ref >40)
LDL Cholesterol: 37 mg/dL (ref 0–99)
Total CHOL/HDL Ratio: 3.4 RATIO
Triglycerides: 108 mg/dL (ref <150)
VLDL: 22 mg/dL (ref 0–40)

## 2018-09-23 LAB — FOLATE RBC
Folate, Hemolysate: 491 ng/mL
Folate, RBC: 2202 ng/mL (ref >498)
Hematocrit: 22.3 % — ABNORMAL LOW (ref 37.5–51.0)

## 2018-09-23 LAB — BASIC METABOLIC PANEL
Anion gap: 9 (ref 5–15)
BUN: 10 mg/dL (ref 8–23)
CO2: 28 mmol/L (ref 22–32)
Calcium: 9 mg/dL (ref 8.9–10.3)
Chloride: 101 mmol/L (ref 98–111)
Creatinine, Ser: 1.06 mg/dL (ref 0.61–1.24)
GFR calc Af Amer: >60 mL/min (ref >60)
GFR calc non Af Amer: >60 mL/min (ref >60)
Glucose, Bld: 89 mg/dL (ref 70–99)
Potassium: 4.1 mmol/L (ref 3.5–5.1)
Sodium: 138 mmol/L (ref 135–145)

## 2018-09-23 MED ORDER — TECHNETIUM TC 99M TETROFOSMIN IV KIT
30.0000 | PACK | Freq: Once | INTRAVENOUS | Status: AC | PRN
  Administered 2018-09-23: 11:00:00 30 via INTRAVENOUS

## 2018-09-23 MED ORDER — INFLUENZA VAC A&B SA ADJ QUAD 0.5 ML IM PRSY
0.5000 mL | PREFILLED_SYRINGE | INTRAMUSCULAR | Status: DC
  Filled 2018-09-23: qty 0.5

## 2018-09-23 MED ORDER — SODIUM CHLORIDE 0.9 % IV SOLN
INTRAVENOUS | Status: DC | PRN
  Administered 2018-09-23: 250 mL via INTRAVENOUS

## 2018-09-23 MED ORDER — REGADENOSON 0.4 MG/5ML IV SOLN
INTRAVENOUS | Status: AC
  Administered 2018-09-23: 0.4 mg via INTRAVENOUS
  Filled 2018-09-23: qty 5

## 2018-09-23 MED ORDER — TECHNETIUM TC 99M TETROFOSMIN IV KIT
10.0000 | PACK | Freq: Once | INTRAVENOUS | Status: AC | PRN
  Administered 2018-09-23: 10 via INTRAVENOUS

## 2018-09-23 MED ORDER — TECHNETIUM TC 99M TETROFOSMIN IV KIT: 10.0000 | PACK | Freq: Once | INTRAVENOUS | Status: DC | PRN

## 2018-09-23 MED ORDER — REGADENOSON 0.4 MG/5ML IV SOLN
0.4000 mg | Freq: Once | INTRAVENOUS | Status: AC
  Administered 2018-09-23: 11:00:00 0.4 mg via INTRAVENOUS
  Filled 2018-09-23: qty 5

## 2018-09-23 NOTE — Care Management Important Message (Signed)
Important Message  Patient Details  Name: NOSSON COVAULT MRN: [id number] Date of Birth: 1950-04-14   Medicare Important Message Given:  Yes     Shelda Altes 09/23/2018, 12:09 PM

## 2018-09-23 NOTE — Progress Notes (Addendum)
Progress Note  Patient Name: Adrian Andrews Date of Encounter: 09/23/2018  Primary Cardiologist: Dr. Percival Spanish (new)  Subjective   Getting nuclear study done today. Denies CP. No dyspnea. Only complaint is left sided hip pain. Missed his scheduled pain meds and gabapentin due to NPO for stress test.   Inpatient Medications    Scheduled Meds: . aspirin EC  81 mg Oral Daily  . atorvastatin  40 mg Oral QHS  . Chlorhexidine Gluconate Cloth  6 each Topical Daily  . enoxaparin (LOVENOX) injection  40 mg Subcutaneous Daily  . fluticasone  2 spray Each Nare Daily  . gabapentin  300 mg Oral q morning - 10a  . gabapentin  600 mg Oral QHS  . guaiFENesin  600 mg Oral BID  . insulin aspart  0-15 Units Subcutaneous TID WC  . insulin aspart  0-5 Units Subcutaneous QHS  . insulin detemir  8 Units Subcutaneous BID  . ipratropium  0.5 mg Inhalation TID  . levalbuterol  1.25 mg Nebulization TID  . lidocaine  1 patch Transdermal Q24H  . mouth rinse  15 mL Mouth Rinse BID  . metoprolol tartrate  25 mg Oral BID  . pantoprazole  40 mg Oral BID  . regadenoson      . regadenoson  0.4 mg Intravenous Once  . senna-docusate  1 tablet Oral QHS  . sertraline  50 mg Oral Daily  . traZODone  50 mg Oral QHS   Continuous Infusions: . azithromycin 500 mg (09/22/18 1733)  . ceFEPime (MAXIPIME) IV 2 g (09/22/18 2215)  . phenylephrine (NEO-SYNEPHRINE) Adult infusion Stopped (09/19/18 0939)   PRN Meds: albuterol, alum & mag hydroxide-simeth, lip balm, loperamide, LORazepam, ondansetron (ZOFRAN) IV, polyethylene glycol, sodium chloride, traMADol   Vital Signs    Vitals:   09/22/18 2025 09/23/18 0103 09/23/18 0458 09/23/18 0749  BP: 135/67 133/62 131/66   Pulse: 86 87 86 90  Resp: 20 20 20 18   Temp: 98.9 F (37.2 C) 97.8 F (36.6 C) 97.8 F (36.6 C)   TempSrc: Oral Oral Oral   SpO2: 96% 93% 100% 99%  Weight:      Height:        Intake/Output Summary (Last 24 hours) at 09/23/2018 0956 Last data  filed at 09/23/2018 0900 Gross per 24 hour  Intake 1780 ml  Output 3450 ml  Net -1670 ml   Last 3 Weights 09/22/2018 09/21/2018 09/20/2018  Weight (lbs) 162 lb 1.6 oz 180 lb 12.4 oz 164 lb 3.9 oz  Weight (kg) 73.528 kg 82 kg 74.5 kg      Telemetry    NSR - Personally Reviewed  ECG    No new EKG to review today - Personally Reviewed  Physical Exam   GEN: No acute distress.   Neck: No JVD Cardiac: RRR, no murmurs, rubs, or gallops.  Respiratory: Clear to auscultation bilaterally. GI: Soft, nontender, non-distended  MS: No edema; No deformity. Neuro:  Nonfocal  Psych: Normal affect   Labs    High Sensitivity Troponin:   Recent Labs  Lab 09/21/18 1506 09/21/18 1634 09/22/18 0708 09/22/18 1659 09/22/18 1915  TROPONINIHS 85* 82* 131* 99* 96*      Chemistry Recent Labs  Lab 09/17/18 0618  09/18/18 0536 09/19/18 [id number]  09/21/18 0542 09/22/18 0708 09/23/18 0349  NA  --    < > 134* 132*   < > 132* 133* 138  K  --    < > 3.7 3.8   < >  3.8 4.5 4.1  CL  --    < > 99 95*   < > 96* 100 101  CO2  --    < > 28 26   < > 27 24 28   GLUCOSE  --    < > 141* 177*   < > 153* 145* 89  BUN  --    < > 20 17   < > 11 10 10   CREATININE  --    < > 1.15 1.23   < > 1.28* 1.15 1.06  CALCIUM  --    < > 7.8* 8.1*   < > 8.0* 8.5* 9.0  PROT 4.8*  --  4.6* 5.3*  --   --   --   --   ALBUMIN 1.9*  --  1.8* 2.4*  --   --   --   --   AST 14*  --  17 20  --   --   --   --   ALT 23  --  22 24  --   --   --   --   ALKPHOS 66  --  62 67  --   --   --   --   BILITOT 3.0*  --  1.8* 2.1*  --   --   --   --   GFRNONAA  --    < > >60 60*   < > 57* >60 >60  GFRAA  --    < > >60 >60   < > >60 >60 >60  ANIONGAP  --    < > 7 11   < > 9 9 9    < > = values in this interval not displayed.     Hematology Recent Labs  Lab 09/21/18 0542 09/22/18 0116 09/22/18 0708 09/23/18 0349  WBC 7.2  --  7.8 7.9  RBC 2.13*  --  2.88* 3.21*  HGB 7.2* 9.6* 10.2* 10.6*  HCT 21.5* 27.7* 28.4* 31.3*  MCV 100.9*  --  98.6  97.5  MCH 33.8  --  35.4* 33.0  MCHC 33.5  --  35.9 33.9  RDW 17.1*  --  17.4* 17.2*  PLT 284  --  258 261    BNPNo results for input(s): BNP, PROBNP in the last 168 hours.   DDimer  Recent Labs  Lab 09/18/18 1403  DDIMER 2.36*     Radiology    No results found.  Cardiac Studies   Echo 09/14/2018 1. The left ventricle has normal systolic function, with an ejection fraction of 55-60%. The cavity size was normal. Left ventricular diastolic Doppler parameters are consistent with pseudonormalization. No evidence of left ventricular regional wall  motion abnormalities. 2. Mild calcification of the mitral valve leaflet. There is moderate mitral annular calcification present. No evidence of mitral valve stenosis. No significant mitral regurgitation. 3. The aortic valve is tricuspid. Mild calcification of the aortic valve. No stenosis of the aortic valve. 4. The aortic root is normal in size and structure. 5. The right ventricle has mildly reduced systolic function. The cavity was mildly enlarged. There is no increase in right ventricular wall thickness. Mildly D-shaped interventricular septum suggests a degree of RV pressure/volume overload. 6. The inferior vena cava was dilated in size with >50% respiratory variability. No complete TR doppler jet so unable to estimate PA systolic pressure.  Patient Profile     Adrian Andrews is a 68 y.o. male with a hx of hypertension, hyperlipidemia, carotid artery  disease status post left carotid endarterectomy, COPD, stroke, diabetes mellitus and prior tobacco smoker (quit 04/2018)  who is being seen today for the evaluation of elevated troponin at the request of Dr. [place] Crane.   Assessment & Plan    1. Elevated HS Troponin: Hs troponin trending up 61>>85>>82>>131 after episode of NSVT. No associated anginal symptoms. Admit EKG showed transient ST depression in lead V3 to V5. Echocardiogram 8/31 showed preserved LV function without wall motion  abnormality.   Plan NST today to r/o coronary ischemia  If abnormal w/ large area of ischemia, will plan for [facility] tomorrow  If low risk NST, no further inpatient cardiac w/u  2. NSVT: 27 beat run noted this admit. 2D echo w/ normal LVF. No increased LV wall thickness.  K normal at 4.1. Mg 1.7 yesterday.  Continue metoprolol  Keep K > 4.0 and Mg > 2.0  Consider outpatient monitor once discharged for further assessment   If significant recurrence>>outpatient EP referral and consideration of AAD therapy   3. CAP: management per primary team  4. Anemia:   Status post transfusion of 1 unit of PRBCs>> Hgb improved to 10.6 today  Work up per primary team     For questions or updates, please contact [place] Please consult [email or web address] for contact info under        Signed, Lyda Jester, PA-C  09/23/2018, 9:56 AM    History and all data above reviewed.  Patient examined.   He denies chest pain or SOB.   I agree with the findings as above.  Stress test completed.  Awaiting results.  The patient exam reveals COR:RRR  ,  Lungs: Clear  ,  Abd: Positive bowel sounds, no rebound no guarding, Ext No edema  .  All available labs, radiology testing, previous records reviewed. Agree with documented assessment and plan.   NSVT:  No symptoms.  Likely self limited and will not require other therapy.  Will await results of Lexiscan Myoview.    Adrian Andrews  12:33 PM  09/23/2018

## 2018-09-23 NOTE — Progress Notes (Signed)
CSW has submitted PASRR requested info to [place] [facility], pending PASRR number.   [place], [place]

## 2018-09-23 NOTE — Plan of Care (Signed)
  Problem: Coping: Goal: Level of anxiety will decrease Outcome: Progressing   Problem: Pain Managment: Goal: General experience of comfort will improve Outcome: Progressing   Problem: Safety: Goal: Ability to remain free from injury will improve Outcome: Progressing   

## 2018-09-23 NOTE — TOC Initial Note (Signed)
Transition of Care [facility]) - Initial/Assessment Note    Patient Details  Name: Adrian Andrews MRN: [id number] Date of Birth: February 12, 1950  Transition of Care [facility] CM/SW Contact:    Alberteen Sam, LCSW Phone Number: 09/23/2018, 1:55 PM  Clinical Narrative:                  CSW notes previous communication by CSW Arbie Cookey from 9/8 in which bed offers list was sent to patient's caregiver Mae upon her request to choose facility.   CSW followed up with Mae today via phone, she reports she has not had time to review the email of SNF bed offers. She asked CSW to review facilities with her, CSW informed her of current bed offers. Mae reports she would prefer patient to go to [facility] as this is closest to the hospital. She asked CSW To follow up with patient to inform him of choice.   Expected Discharge Plan: Skilled Nursing Facility Barriers to Discharge: Continued Medical Work up   Patient Goals and CMS Choice Patient states their goals for this hospitalization and ongoing recovery are:: Pt would like to be able perform normal activites without getting SOB CMS Medicare.gov Compare Post Acute Care list provided to:: Patient Represenative (must comment)(Mae) Choice offered to / list presented to : Spouse(ex wife and caregiver Mae)  Expected Discharge Plan and Services Expected Discharge Plan: [place] Acute Care Choice: [place] Living arrangements for the past 2 months: Single Family Home                           HH Arranged: RN   Date [facility] Agency Contacted: 09/11/18 Time HH Agency Contacted: 66 Representative spoke with at [place]: Tommi Rumps  Prior Living Arrangements/Services Living arrangements for the past 2 months: [place] Lives with:: Self Patient language and need for interpreter reviewed:: Yes Do you feel safe going back to the place where you live?: Yes      Need for Family Participation in Patient Care: Yes  (Comment) Care giver support system in place?: Yes (comment) Current home services: DME(home oxygen) Criminal Activity/Legal Involvement Pertinent to Current Situation/Hospitalization: No - Comment as needed  Activities of Daily Living Home Assistive Devices/Equipment: None ADL Screening (condition at time of admission) Patient's cognitive ability adequate to safely complete daily activities?: Yes Is the patient deaf or have difficulty hearing?: No Does the patient have difficulty seeing, even when wearing glasses/contacts?: No Does the patient have difficulty concentrating, remembering, or making decisions?: No Patient able to express need for assistance with ADLs?: Yes Does the patient have difficulty dressing or bathing?: No Independently performs ADLs?: Yes (appropriate for developmental age) Does the patient have difficulty walking or climbing stairs?: No Weakness of Legs: Both Weakness of Arms/Hands: Both  Permission Sought/Granted Permission sought to share information with : Case Manager, Customer service manager, Family Supports Permission granted to share information with : Yes, Verbal Permission Granted  Share Information with NAME: Mae  Permission granted to share info w AGENCY: SNFs  Permission granted to share info w Relationship: caregiver  Permission granted to share info w Contact Information: [phone]  Emotional Assessment Appearance:: Appears stated age Attitude/Demeanor/Rapport: Gracious Affect (typically observed): Calm Orientation: : Oriented to Self, Oriented to Place, Oriented to  Time, Oriented to Situation Alcohol / Substance Use: Not Applicable Psych Involvement: No (comment)  Admission diagnosis:  Severe sepsis ([place]) [A41.9, R65.20] Community  acquired pneumonia of right lower lobe of lung ([place]) [J18.1] RLL pneumonia ([place]) [J18.1] Patient Active Problem List   Diagnosis Date Noted  . Septic shock ([place])   . Acute on chronic diastolic CHF  (congestive heart failure) ([place]) 09/16/2018  . Hyperbilirubinemia 09/16/2018  . RLL pneumonia ([place]) 09/10/2018  . Severe sepsis ([place]) 09/10/2018  . Essential hypertension 09/10/2018  . Dyslipidemia 09/10/2018  . COPD with acute exacerbation ([place]) 02/14/2018  . Nicotine use disorder 02/14/2018  . CKD (chronic kidney disease) stage 3, GFR 30-59 ml/min (HCC) 02/14/2018  . COPD (chronic obstructive pulmonary disease) ([place]) 02/13/2018  . Lactic acidosis 02/13/2018  . Type 2 diabetes mellitus with other specified complication ([place]) [id number]  . Acute respiratory failure with hypoxia ([place]) 12/21/2017  . CAP (community acquired pneumonia) 12/21/2017  . AKI (acute kidney injury) ([place]) 12/21/2017  . COPD exacerbation ([place]) 12/20/2017  . Heart failure ([place]) 12/20/2017  . Carotid artery stenosis, symptomatic, left 02/21/2017   PCP:  [name], MD Pharmacy:   [facility] [place] - [id number] [place] [address] Phone: [phone] Fax: [phone]     Social Determinants of Health (SDOH) Interventions    Readmission Risk Interventions Readmission Risk Prevention Plan 09/11/2018  Transportation Screening Complete  HRI or Home Care Consult Complete  Some recent data might be hidden

## 2018-09-23 NOTE — Progress Notes (Signed)
   [name] presented for a nuclear stress test today.  No immediate complications.  Stress imaging is pending at this time.  Preliminary EKG findings may be listed in the chart, but the stress test result will not be finalized until perfusion imaging is complete.  Tami Lin Weslee Prestage, PA-C 09/23/2018, 10:59 AM

## 2018-09-23 NOTE — Progress Notes (Signed)
PROGRESS NOTE  Adrian Andrews [id number] DOB: 1950-05-01 DOA: 09/10/2018 PCP: [name], MD  HPI/Recap of past 48 hours: 68 year old male with history of CVA, carotid artery stenosis/stent, DM-2, CKD 3, HTN, HLD, COPD-3L and former smoker presenting with fever, productive cough, shortness of breath and diarrhea and admitted for sepsis due to RLL pneumonia, and AKI.  In ED, soft blood pressures.  Lactic acidosis.  CXR revealed RLL pneumonia.  COVID-19 negative. Started on ceftriaxone, azithromycin and a steroid.  PCCM consulted given soft blood pressure and uptrending lactic acidosis and recommended hydration.  Hospital course complicated by severe hypoxia requiring high flow nasal cannula.  CT chest done without contrast due to allergy showed no clear evidence of pulmonary embolism.  Revealed bilateral pulmonary infiltrates with increase in pulmonary vascularity.  Started on IV Lasix with improvement of hypoxia.  Elevated inflammatory markers with suspicion for COVID-19.  Repeated in-house COVID-19 negative.  2D echo done on 09/14/2018 unrevealing.  Showing normal LVEF 55 to 60%.  Hospital course complicated by respiratory distress on 09/18/2018 and severe hypotension requiring pressors secondary to septic shock in the setting of cavitary pneumonia.  Transferred to ICU on 09/18/2018.  Also anemia with drop in hemoglobin to 7.2.  Patient found to cold agglutinin antibodies requiring blood warming, therefore started to the ICU on 09/21/2018 for blood transfusion.  Sustained V. tach reported on 09/21/2010 EKG reveals ST depressions on V4 V5 and V6.  Troponin elevated and trended up.  Denies anginal symptoms.  Seen by cardiology and following.  09/23/18: Patient seen and examined at his bedside this morning.  He denies any chest pain, palpitations or dyspnea at rest.  Troponin S peaked at 131 trending down.  Plan for [facility] today.    Assessment/Plan: Principal Problem:   Severe  sepsis (HCC) Active Problems:   Acute respiratory failure with hypoxia (HCC)   COPD (chronic obstructive pulmonary disease) (HCC)   Type 2 diabetes mellitus with other specified complication (HCC)   CKD (chronic kidney disease) stage 3, GFR 30-59 ml/min (HCC)   RLL pneumonia (HCC)   Essential hypertension   Acute on chronic diastolic CHF (congestive heart failure) ([place])   Hyperbilirubinemia   Septic shock (HCC)   Resolving septic shock secondary to severe community-acquired pneumonia  Presented with tachypnea and tachycardia with right lower lobe infiltrates on chest x-ray done on 09/10/2018.  Procalcitonin 7.44>> 5.25>> 2.19>> 0.47 on 09/18/18 COVID-19 negative x2 during this admission MRSA screening negative Sputum culture consistent with normal flora Day #13 of 14 of IV antibiotics, prolonged IV antibiotic due to slow to respond severe community-acquired pneumonia.  Blood cultures drawn on 09/10/2018 final negative  Urine culture drawn on 09/16/2018 final negative Repeat blood cultures peripherally x2 on 09/19/2018 negative x3 days Vital signs reviewed and are stable AFB sent and in process Sputum culture reintubated for better growth, Gram stain showing rare gram-positive cocci and rare yeast Started on meropenem from 09/18/2018 with pressors IV azithromycin re-added on 09/19/2018 Seen by infectious disease, following. Off pressors since 09/19/2018 Has been maintaining map greater than 65 without pressors for 3 days  Elevated troponin, suspect demand ischemia from tachycardia Troponin S peaked at 131 and trended down Asymptomatic, denies anginal symptoms Abnormal EKG Seen by cardiology, plan for [facility] on 09/23/2018  Nonsustained V. tach, asymptomatic Denies any anginal symptoms Continue to monitor on telemetry  Acute blood loss in the setting of cold agglutinin antibodies Hemoglobin dropped to 7.2 on 09/21/18 Transfused 1 unit pRBC 9/7  with blood warming FOBT x2, not  completed FOBT ordered once more Iron studies unrevealing Hg 10.2 on 09/22/18  Acute on chronic hypoxic respiratory failure suspect multifactorial secondary to cavitary CAP, acute on chronic diastolic CHF, right pleural effusion, ?aspiration On 2 L of oxygen as needed at baseline Increase in oxygen requirement on 09/18/18 HFNC >> Venturi mask Pulmonology reconsulted on 09/18/2018 ?aspiration, seen by speech therapy  Continue bronchodilators Continue to maintain O2 saturation greater than 92% PCCM has been consulted to assist with the management Currently on 3 L nasal cannula maintaining saturation in the mid 90s  LUTS, resolving on Pyridium Urine culture done on 09/16/2018 no growth, final Continue Pyridium   Right pleural effusion suspect infective from CAP Last 2D echo normal LVEF Off diuretic due to soft BP Continue meropenem and IV azithromycin, complete 14 days of IV antibiotics on 09/23/2018. Personally reviewed chest x-ray done on 09/20/2018, right mid, right lower lobe infiltrates with small right pleural effusion  Suspected dysphagia Seen by speech therapy with recommendations Aspiration precautions  Hypomagnesemia Magnesium 1.7 Replete as indicated  Resolving hypotension No longer requiring pressors Maintain map greater than 65  Cavitary community-acquired pneumonia, POA Management as stated above Mucinex for cough as needed Pulmonary toilet as needed Continue flutter valve as tolerated AFB ordered, seen by infectious disease, signed off on 09/21/2018.  Chronic low back pain Continue lidocaine patch  Elevated d-dimer D-dimer 11 on 09/13/2018>> 2.3 on 09/18/18 No evidence of PE on CT chest no contrast Negative DVT in lower extremities bilaterally COVID 19-x2 during this admission  Hypotensive in the setting of septic shock.  Home atenolol has been held  Significantly elevated BNP with concern for acute on chronic diastolic CHF BNP from [id number] on 02/13/2018 to greater than  2300 on 09/13/2018 2D echo done on 09/14/2018 showed normal LVEF 55 to 60%.  IV Lasix 40 mg daily Net I&O -23.3 L since admission Continue strict I's and O's and daily weight  Hypokalemia/hypophosphatemia/hypomagnesemia Replete as indicated  Resolving isolated hyperbilirubinemia, unclear etiology Total bilirubin 2.3 on 09/15/18>>3.0 on 09/17/18 >>1.8 on 09/18/18>> 2.1 on 09/19/18 Alkaline phosphatase, AST and ALT normal  Resolving euvolemic hyponatremia Na+ 134 >> 132 on 09/19/18>> 133 on 09/20/2018. Continue to monitor  AKI on CKD 2 Baseline creatinine 1.1 with GFR greater than 60  Creatinine at baseline 1.1 GFR greater than 60 on 09/20/2018. Continue to avoid nephrotoxins and continue to monitor urine output  Resolved mild thrombocytopenia Presented with platelet count of 111 Platelet count 290 on 09/18/2018  Prior polysubstance abuse with benzodiazepine and cocaine Last UDS on 05/04/2016+ for benzodiazepine and cocaine No repeated UDS during this admission  Chronic anxiety/depression Resume Zoloft, trazodone, Neurontin when hemodynamically stable  COPD Continue inhalers  Type 2 diabetes with hyperglycemia Hemoglobin A1c 7.6 on 09/10/2018 Resume Levemir and continue insulin sliding scale Avoid hypoglycemia  Physical debility/ambulatory dysfunction PT OT assessment recommended SNF CSW consulted for assistance in SNF placement Continue fall precautions Ambulate every shift as tolerated Awaiting bed placement  Code Status: DNR  Family Communication: None at bedside  Disposition Plan: Ongoing work-up by cardiology.  Awaiting bed placement at SNF.  CSW assisting with placement.  Consultants:  PCCM  Infectious disease on 09/19/2018.  Cardiology  Procedures:  None  Antimicrobials:  Meropenem  DVT prophylaxis: Subcu Lovenox daily.   Objective: Vitals:   09/23/18 1043 09/23/18 1051 09/23/18 1211 09/23/18 1313  BP: (!) 80/58 134/62 133/74   Pulse:   100 92  Resp:  20  Temp:   98.6 F (37 C)   TempSrc:   Oral   SpO2:   92% 94%  Weight:      Height:        Intake/Output Summary (Last 24 hours) at 09/23/2018 1332 Last data filed at 09/23/2018 0900 Gross per 24 hour  Intake 1200 ml  Output 2700 ml  Net -1500 ml   Filed Weights   09/20/18 0500 09/21/18 0500 09/22/18 1542  Weight: 74.5 kg 82 kg 73.5 kg    Exam:  . General: 68 y.o. year-old male PERRLA.  No acute distress.  Alert and oriented x3.   . Cardiovascular: Regular rate and rhythm no rubs or gallops no JVD or thyromegaly. Marland Kitchen Respiratory: Clear to auscultation no wheezes or rales. Poor inspiratory effort.  .  Abdomen: Nontender non distended with bowel sounds present. . Musculoskeletal: No lower extremity edema.  2/4 pulses in all 4 extremities.  Marland Kitchen Psychiatry: Mood is appropriate for condition and setting.    Data Reviewed: CBC: Recent Labs  Lab 09/19/18 0639 09/20/18 0416 09/21/18 0542 09/22/18 0116 09/22/18 0708 09/23/18 0349  WBC 12.9* 10.7* 7.2  --  7.8 7.9  NEUTROABS 12.2*  --   --   --   --   --   HGB 7.3* 7.8* 7.2* 9.6* 10.2* 10.6*  HCT 22.8* 23.4* 21.5* 27.7* 28.4* 31.3*  MCV 100.0 99.6 100.9*  --  98.6 97.5  PLT 358 327 284  --  258 0000000   Basic Metabolic Panel: Recent Labs  Lab 09/19/18 0639 09/20/18 0416 09/21/18 0542 09/22/18 0708 09/22/18 1659 09/23/18 0349  NA 132* 133* 132* 133*  --  138  K 3.8 3.8 3.8 4.5  --  4.1  CL 95* 98 96* 100  --  101  CO2 26 27 27 24   --  28  GLUCOSE 177* 196* 153* 145*  --  89  BUN 17 12 11 10   --  10  CREATININE 1.23 1.11 1.28* 1.15  --  1.06  CALCIUM 8.1* 8.2* 8.0* 8.5*  --  9.0  MG 1.7  --   --   --  1.7  --   PHOS 2.4*  --   --   --   --   --    GFR: Estimated Creatinine Clearance: 65.8 mL/min (by C-G formula based on SCr of 1.06 mg/dL). Liver Function Tests: Recent Labs  Lab 09/17/18 0618 09/18/18 0536 09/19/18 0639  AST 14* 17 20  ALT 23 22 24   ALKPHOS 66 62 67  BILITOT 3.0* 1.8* 2.1*  PROT 4.8* 4.6*  5.3*  ALBUMIN 1.9* 1.8* 2.4*   No results for input(s): LIPASE, AMYLASE in the last 168 hours. No results for input(s): AMMONIA in the last 168 hours. Coagulation Profile: No results for input(s): INR, PROTIME in the last 168 hours. Cardiac Enzymes: No results for input(s): CKTOTAL, CKMB, CKMBINDEX, TROPONINI in the last 168 hours. BNP (last 3 results) No results for input(s): PROBNP in the last 8760 hours. HbA1C: No results for input(s): HGBA1C in the last 72 hours. CBG: Recent Labs  Lab 09/22/18 1132 09/22/18 1707 09/22/18 2127 09/23/18 0642 09/23/18 1213  GLUCAP 183* 183* 210* 96 231*   Lipid Profile: Recent Labs    09/23/18 0349  CHOL 84  HDL 25*  LDLCALC 37  TRIG 108  CHOLHDL 3.4   Thyroid Function Tests: No results for input(s): TSH, T4TOTAL, FREET4, T3FREE, THYROIDAB in the last 72 hours. Anemia Panel: No results for  input(s): VITAMINB12, FOLATE, FERRITIN, TIBC, IRON, RETICCTPCT in the last 72 hours. Urine analysis:    Component Value Date/Time   COLORURINE YELLOW 09/16/2018 [place] 09/16/2018 1715   LABSPEC 1.006 09/16/2018 1715   PHURINE 6.0 09/16/2018 1715   GLUCOSEU NEGATIVE 09/16/2018 1715   HGBUR MODERATE (A) 09/16/2018 1715   BILIRUBINUR NEGATIVE 09/16/2018 1715   BILIRUBINUR neg 09/10/2013 1308   KETONESUR NEGATIVE 09/16/2018 1715   PROTEINUR NEGATIVE 09/16/2018 1715   UROBILINOGEN 0.2 09/10/2013 1308   NITRITE NEGATIVE 09/16/2018 1715   LEUKOCYTESUR NEGATIVE 09/16/2018 1715   Sepsis Labs: @LABRCNTIP (procalcitonin:4,lacticidven:4)  ) Recent Results (from the past 240 hour(s))  SARS Coronavirus 2 [facility] order, Performed in [place] hospital lab)     Status: None   Collection Time: 09/13/18  3:43 PM  Result Value Ref Range Status   SARS Coronavirus 2 NEGATIVE NEGATIVE Final    Comment: (NOTE) If result is NEGATIVE SARS-CoV-2 target nucleic acids are NOT DETECTED. The SARS-CoV-2 RNA is generally detectable in upper  and lower  respiratory specimens during the acute phase of infection. The lowest  concentration of SARS-CoV-2 viral copies this assay can detect is 250  copies / mL. A negative result does not preclude SARS-CoV-2 infection  and should not be used as the sole basis for treatment or other  patient management decisions.  A negative result may occur with  improper specimen collection / handling, submission of specimen other  than nasopharyngeal swab, presence of viral mutation(s) within the  areas targeted by this assay, and inadequate number of viral copies  (<250 copies / mL). A negative result must be combined with clinical  observations, patient history, and epidemiological information. If result is POSITIVE SARS-CoV-2 target nucleic acids are DETECTED. The SARS-CoV-2 RNA is generally detectable in upper and lower  respiratory specimens dur ing the acute phase of infection.  Positive  results are indicative of active infection with SARS-CoV-2.  Clinical  correlation with patient history and other diagnostic information is  necessary to determine patient infection status.  Positive results do  not rule out bacterial infection or co-infection with other viruses. If result is PRESUMPTIVE POSTIVE SARS-CoV-2 nucleic acids MAY BE PRESENT.   A presumptive positive result was obtained on the submitted specimen  and confirmed on repeat testing.  While 2019 novel coronavirus  (SARS-CoV-2) nucleic acids may be present in the submitted sample  additional confirmatory testing may be necessary for epidemiological  and / or clinical management purposes  to differentiate between  SARS-CoV-2 and other Sarbecovirus currently known to infect humans.  If clinically indicated additional testing with an alternate test  methodology [phone]) is advised. The SARS-CoV-2 RNA is generally  detectable in upper and lower respiratory sp ecimens during the acute  phase of infection. The expected result is  Negative. Fact Sheet for Patients:  StrictlyIdeas.no Fact Sheet for Healthcare Providers: BankingDealers.co.za This test is not yet approved or cleared by the [place] FDA and has been authorized for detection and/or diagnosis of SARS-CoV-2 by FDA under an Emergency Use Authorization (EUA).  This EUA will remain in effect (meaning this test can be used) for the duration of the COVID-19 declaration under Section 564(b)(1) of the Act, 21 U.S.C. section 360bbb-3(b)(1), unless the authorization is terminated or revoked sooner. Performed at [facility], [place] [address] [place], [place] [address]   MRSA PCR Screening     Status: None   Collection Time: 09/13/18  6:11 PM   Specimen:  Nasal Mucosa; Nasopharyngeal  Result Value Ref Range Status   MRSA by PCR NEGATIVE NEGATIVE Final    Comment:        The GeneXpert MRSA Assay (FDA approved for NASAL specimens only), is one component of a comprehensive MRSA colonization surveillance program. It is not intended to diagnose MRSA infection nor to guide or monitor treatment for MRSA infections. Performed at [facility], [place] [address], [place], [place] [address]   Respiratory Panel by PCR     Status: None   Collection Time: 09/13/18  6:12 PM   Specimen: Nasopharyngeal Swab; Respiratory  Result Value Ref Range Status   Adenovirus NOT DETECTED NOT DETECTED Final   Coronavirus 229E NOT DETECTED NOT DETECTED Final    Comment: (NOTE) The Coronavirus on the Respiratory Panel, DOES NOT test for the novel  Coronavirus (2019 nCoV)    Coronavirus HKU1 NOT DETECTED NOT DETECTED Final   Coronavirus NL63 NOT DETECTED NOT DETECTED Final   Coronavirus OC43 NOT DETECTED NOT DETECTED Final   Metapneumovirus NOT DETECTED NOT DETECTED Final   Rhinovirus / Enterovirus NOT DETECTED NOT DETECTED Final   Influenza A NOT DETECTED NOT DETECTED Final   Influenza B NOT DETECTED NOT DETECTED Final    Parainfluenza Virus 1 NOT DETECTED NOT DETECTED Final   Parainfluenza Virus 2 NOT DETECTED NOT DETECTED Final   Parainfluenza Virus 3 NOT DETECTED NOT DETECTED Final   Parainfluenza Virus 4 NOT DETECTED NOT DETECTED Final   Respiratory Syncytial Virus NOT DETECTED NOT DETECTED Final   Bordetella pertussis NOT DETECTED NOT DETECTED Final   Chlamydophila pneumoniae NOT DETECTED NOT DETECTED Final   Mycoplasma pneumoniae NOT DETECTED NOT DETECTED Final    Comment: Performed at [facility], [place]. [address] [place], [place] [address]  Culture, Urine     Status: None   Collection Time: 09/16/18  5:49 PM   Specimen: Urine, Clean Catch  Result Value Ref Range Status   Specimen Description URINE, CLEAN CATCH  Final   Special Requests NONE  Final   Culture   Final    NO GROWTH Performed at [facility], [place] [address], [place], [place] [address]    Report Status 09/17/2018 FINAL  Final  Expectorated sputum assessment w rflx to resp cult     Status: None   Collection Time: 09/19/18 10:14 AM   Specimen: Sputum  Result Value Ref Range Status   Specimen Description SPUTUM  Final   Special Requests NONE  Final   Sputum evaluation   Final    THIS SPECIMEN IS ACCEPTABLE FOR SPUTUM CULTURE Performed at [facility], [place] [address] [place], [place] [address]    Report Status 09/19/2018 FINAL  Final  Culture, respiratory     Status: None   Collection Time: 09/19/18 10:14 AM   Specimen: SPU  Result Value Ref Range Status   Specimen Description SPUTUM  Final   Special Requests NONE Reflexed from [id number]  Final   Gram Stain   Final    FEW WBC PRESENT, PREDOMINANTLY PMN RARE GRAM POSITIVE COCCI RARE YEAST    Culture   Final    Consistent with normal respiratory flora. Performed at [facility], [place] [address] [place], [place] [address]    Report Status 09/21/2018 FINAL  Final  Culture, blood (routine x 2)     Status: None (Preliminary result)   Collection Time:  09/19/18 10:34 AM   Specimen: BLOOD LEFT ARM  Result Value Ref Range Status   Specimen Description BLOOD LEFT ARM  Final   Special Requests   Final    BOTTLES DRAWN AEROBIC ONLY Blood Culture results may not be optimal due to an inadequate volume of blood received in culture bottles   Culture   Final    NO GROWTH 4 DAYS Performed at [place] [address], [place], [place] [address]    Report Status PENDING  Incomplete  Culture, blood (routine x 2)     Status: None (Preliminary result)   Collection Time: 09/19/18 10:39 AM   Specimen: BLOOD LEFT HAND  Result Value Ref Range Status   Specimen Description BLOOD LEFT HAND  Final   Special Requests   Final    BOTTLES DRAWN AEROBIC ONLY Blood Culture results may not be optimal due to an inadequate volume of blood received in culture bottles   Culture   Final    NO GROWTH 4 DAYS Performed at [facility], [place] [address], [place], [place] [address]    Report Status PENDING  Incomplete      Studies: No results found.  Scheduled Meds: . aspirin EC  81 mg Oral Daily  . atorvastatin  40 mg Oral QHS  . Chlorhexidine Gluconate Cloth  6 each Topical Daily  . enoxaparin (LOVENOX) injection  40 mg Subcutaneous Daily  . fluticasone  2 spray Each Nare Daily  . gabapentin  300 mg Oral q morning - 10a  . gabapentin  600 mg Oral QHS  . guaiFENesin  600 mg Oral BID  . insulin aspart  0-15 Units Subcutaneous TID WC  . insulin aspart  0-5 Units Subcutaneous QHS  . insulin detemir  8 Units Subcutaneous BID  . ipratropium  0.5 mg Inhalation TID  . levalbuterol  1.25 mg Nebulization TID  . lidocaine  1 patch Transdermal Q24H  . mouth rinse  15 mL Mouth Rinse BID  . metoprolol tartrate  25 mg Oral BID  . pantoprazole  40 mg Oral BID  . senna-docusate  1 tablet Oral QHS  . sertraline  50 mg Oral Daily  . traZODone  50 mg Oral QHS    Continuous Infusions: . azithromycin 500 mg (09/22/18 1733)  . ceFEPime (MAXIPIME) IV 2 g  (09/23/18 1228)  . phenylephrine (NEO-SYNEPHRINE) Adult infusion Stopped (09/19/18 0939)     LOS: 13 days     Kayleen Memos, MD [facility] Pager [phone]  If 7PM-7AM, please contact night-coverage [email or web address] Password [email or web address] 09/23/2018, 1:32 PM

## 2018-09-23 NOTE — Progress Notes (Signed)
  Speech Language Pathology Treatment: Dysphagia  Patient Details Name: Adrian Andrews MRN: [id number] DOB: [birth year] Today's Date: 09/23/2018 Time: [id number] SLP Time Calculation (min) (ACUTE ONLY): 13 min  Assessment / Plan / Recommendation Clinical Impression  Pt was encountered awake/alert sitting upright in bed and he was pleasant and cooperative during this tx session.  Pt was observed with limited trials of regular solids and thin liquid secondary to finishing his lunch immediately prior to the onset of this tx session.  Pt presented with a baseline throat clear; however, suspect that it may have been related to po intake during lunch and during this tx session.  Pt denied difficulty with regular solids or thin liquids and no overt s/sx of aspiration were observed during this session.  Pt demonstrated timely mastication of regular solids with trace oral residue that he was sensate to and cleared independently with a liquid wash. SLP educated re: aspiration precautions including sitting upright, taking small bites/sips, taking breaks from po intake if SOB, etc.  Pt verbalized understanding with teach back and demonstrated understanding during po trials.  Recommend continuation of regular solids and thin liquid with intermittent supervision to cue for aspiration precautions.  ST will continue to follow up to assess diet tolerance during larger meal.   HPI HPI: Adrian Andrews is a 68 y.o. male admitted 09/10/2018 with fever, SOB, and productive cough. PMH: CVA, carotid stenosis s/p B stents; HTN; HLD; DM; COPD on 3L home O2; stage 3 CKD, chronic back pain; No prior SLP intervention found. CXR = RLL PNA.       SLP Plan  Continue with current plan of care       Recommendations  Diet recommendations: Regular;Thin liquid Liquids provided via: Cup;Straw Medication Administration: Whole meds with liquid Supervision: Patient able to self feed;Intermittent supervision to cue for compensatory  strategies Compensations: Slow rate;Small sips/bites Postural Changes and/or Swallow Maneuvers: Seated upright 90 degrees;Upright 30-60 min after meal                Oral Care Recommendations: Oral care BID Follow up Recommendations: None SLP Visit Diagnosis: Dysphagia, unspecified (R13.10) Plan: Continue with current plan of care       Bretta Bang, M.S., [place] Office: [phone]               [place] 09/23/2018, 1:59 PM

## 2018-09-24 LAB — CBC
HCT: 31.7 % — ABNORMAL LOW (ref 39.0–52.0)
Hemoglobin: 10.4 g/dL — ABNORMAL LOW (ref 13.0–17.0)
MCH: 32.9 pg (ref 26.0–34.0)
MCHC: 32.8 g/dL (ref 30.0–36.0)
MCV: 100.3 fL — ABNORMAL HIGH (ref 80.0–100.0)
Platelets: 235 10*3/uL (ref 150–400)
RBC: 3.16 MIL/uL — ABNORMAL LOW (ref 4.22–5.81)
RDW: 17.4 % — ABNORMAL HIGH (ref 11.5–15.5)
WBC: 7.3 10*3/uL (ref 4.0–10.5)
nRBC: 0 % (ref 0.0–0.2)

## 2018-09-24 LAB — BASIC METABOLIC PANEL
Anion gap: 8 (ref 5–15)
BUN: 13 mg/dL (ref 8–23)
CO2: 26 mmol/L (ref 22–32)
Calcium: 8.6 mg/dL — ABNORMAL LOW (ref 8.9–10.3)
Chloride: 101 mmol/L (ref 98–111)
Creatinine, Ser: 1.14 mg/dL (ref 0.61–1.24)
GFR calc Af Amer: >60 mL/min (ref >60)
GFR calc non Af Amer: >60 mL/min (ref >60)
Glucose, Bld: 88 mg/dL (ref 70–99)
Potassium: 4.1 mmol/L (ref 3.5–5.1)
Sodium: 135 mmol/L (ref 135–145)

## 2018-09-24 LAB — GLUCOSE, CAPILLARY
Glucose-Capillary: 84 mg/dL (ref 70–99)
Glucose-Capillary: 241 mg/dL — ABNORMAL HIGH (ref 70–99)
Glucose-Capillary: 216 mg/dL — ABNORMAL HIGH (ref 70–99)
Glucose-Capillary: 281 mg/dL — ABNORMAL HIGH (ref 70–99)

## 2018-09-24 MED ORDER — INSULIN ASPART 100 UNIT/ML ~~LOC~~ SOLN
0.0000 [IU] | Freq: Three times a day (TID) | SUBCUTANEOUS | Status: DC
  Administered 2018-09-25 (×3): 3 [IU] via SUBCUTANEOUS
  Administered 2018-09-26: 2 [IU] via SUBCUTANEOUS
  Administered 2018-09-26: 06:00:00 3 [IU] via SUBCUTANEOUS
  Administered 2018-09-26: 12:00:00 5 [IU] via SUBCUTANEOUS
  Administered 2018-09-27: 12:00:00 11 [IU] via SUBCUTANEOUS
  Administered 2018-09-27: 2 [IU] via SUBCUTANEOUS
  Administered 2018-09-27: 18:00:00 3 [IU] via SUBCUTANEOUS
  Administered 2018-09-28: 8 [IU] via SUBCUTANEOUS
  Administered 2018-09-28: 3 [IU] via SUBCUTANEOUS

## 2018-09-24 NOTE — Progress Notes (Signed)
PROGRESS NOTE  Adrian Andrews [id number] DOB: [birth year] DOA: 09/10/2018 PCP: [name], MD  Brief History   68 year old male with history of CVA, carotid artery stenosis/stent, DM-2, CKD 3, HTN, HLD, COPD-3L and former smoker presenting with fever, productive cough, shortness of breath and diarrhea and admitted for sepsis due to RLL pneumonia, and AKI.  In ED, soft blood pressures. Lactic acidosis. CXR revealed RLL pneumonia. COVID-19 negative. Started on ceftriaxone, azithromycin and a steroid. PCCM consulted given soft blood pressure and uptrending lactic acidosis and recommended hydration.  Hospital course complicated by severe hypoxia requiring high flow nasal cannula.  CT chest done without contrast due to allergy showed no clear evidence of pulmonary embolism.  Revealed bilateral pulmonary infiltrates with increase in pulmonary vascularity.  Started on IV Lasix with improvement of hypoxia.  Elevated inflammatory markers with suspicion for COVID-19.  Repeated in-house COVID-19 negative.  2D echo done on 09/14/2018 unrevealing.  Showing normal LVEF 55 to 60%.  Hospital course complicated by respiratory distress on 09/18/2018 and severe hypotension requiring pressors secondary to septic shock in the setting of cavitary pneumonia.  Transferred to ICU on 09/18/2018.  Also anemia with drop in hemoglobin to 7.2.  Patient found to cold agglutinin antibodies requiring blood warming, therefore started to the ICU on 09/21/2018 for blood transfusion.  Sustained V. tach reported on 09/21/2010 EKG reveals ST depressions on V4 V5 and V6.  Troponin elevated and trended up.  Denies anginal symptoms.  Seen by cardiology and following. The patient underwent a Lexiscan Myoview was performed on 09/23/2018. It was a low risk study, but did demonstrated prior myocardial infarction with peri-infarct ischemia. EF mildly decreased at 45-54%.  Consultants   PCCM,  Cardiology  Infectious  Disease.  Procedures   Myoview stress  Antibiotics   Anti-infectives (From admission, onward)   Start     Dose/Rate Route Frequency Ordered Stop   09/21/18 1230  ceFEPIme (MAXIPIME) 2 g in sodium chloride 0.9 % 100 mL IVPB     2 g 200 mL/hr over 30 Minutes Intravenous Every 12 hours 09/21/18 1211 09/23/18 2216   09/19/18 1815  azithromycin (ZITHROMAX) 500 mg in sodium chloride 0.9 % 250 mL IVPB     500 mg 250 mL/hr over 60 Minutes Intravenous Every 24 hours 09/19/18 1804 09/23/18 1801   09/18/18 1400  meropenem (MERREM) 1 g in sodium chloride 0.9 % 100 mL IVPB  Status:  Discontinued     1 g 200 mL/hr over 30 Minutes Intravenous Every 8 hours 09/18/18 1346 09/21/18 1210   09/12/18 1400  ceFEPIme (MAXIPIME) 2 g in sodium chloride 0.9 % 100 mL IVPB  Status:  Discontinued     2 g 200 mL/hr over 30 Minutes Intravenous Every 8 hours 09/12/18 1036 09/18/18 1345   09/10/18 0915  cefTRIAXone (ROCEPHIN) 2 g in sodium chloride 0.9 % 100 mL IVPB  Status:  Discontinued     2 g 200 mL/hr over 30 Minutes Intravenous Every 24 hours 09/10/18 0901 09/12/18 1036   09/10/18 0915  azithromycin (ZITHROMAX) 500 mg in sodium chloride 0.9 % 250 mL IVPB  Status:  Discontinued     500 mg 250 mL/hr over 60 Minutes Intravenous Every 24 hours 09/10/18 0901 09/18/18 0504      Subjective  The patient is resting comfortably. No new complaints.  Objective   Vitals:  Vitals:   09/24/18 1141 09/24/18 1324  BP: (!) 107/58   Pulse: 90 89  Resp: 20 19  Temp: 98.2  F (36.8 C)   SpO2: 92% 92%    Exam:  Constitutional:   The patient is awake, alert, and oriented x 3. No acute distress. Respiratory:   No increased work of breathing.  No wheezes, rales, or rhonchi  No tactile fremitus. Cardiovascular:   Regular rate and rhythm.  No murmurs, ectopy, or gallups.   No lateral PMI. No thrills. Abdomen:   Abdomen is soft, non-tender, non-distended.  No hernias, masses, or  organomegaly.  Normoactive bowel sounds Musculoskeletal:   No cyanosis, clubbing, or edema Skin:   No rashes, lesions, ulcers  palpation of skin: no induration or nodules Neurologic:   CN 2-12 intact  Sensation all 4 extremities intact Psychiatric:   Mental status o Mood, affect appropriate o Orientation to person, place, time   judgment and insight appear intact    I have personally reviewed the following:   Today's Data   Vitals, BMP, CBC  Scheduled Meds:  aspirin EC  81 mg Oral Daily   atorvastatin  40 mg Oral QHS   Chlorhexidine Gluconate Cloth  6 each Topical Daily   enoxaparin (LOVENOX) injection  40 mg Subcutaneous Daily   fluticasone  2 spray Each Nare Daily   gabapentin  300 mg Oral q morning - 10a   gabapentin  600 mg Oral QHS   guaiFENesin  600 mg Oral BID   influenza vaccine adjuvanted  0.5 mL Intramuscular Tomorrow-1000   insulin aspart  0-15 Units Subcutaneous TID WC   insulin aspart  0-5 Units Subcutaneous QHS   insulin detemir  8 Units Subcutaneous BID   ipratropium  0.5 mg Inhalation TID   levalbuterol  1.25 mg Nebulization TID   lidocaine  1 patch Transdermal Q24H   mouth rinse  15 mL Mouth Rinse BID   metoprolol tartrate  25 mg Oral BID   pantoprazole  40 mg Oral BID   senna-docusate  1 tablet Oral QHS   sertraline  50 mg Oral Daily   traZODone  50 mg Oral QHS   Continuous Infusions:  sodium chloride 250 mL (09/23/18 2144)   phenylephrine (NEO-SYNEPHRINE) Adult infusion Stopped (09/19/18 0939)    Principal Problem:   Severe sepsis ([place]) Active Problems:   Acute respiratory failure with hypoxia (HCC)   COPD (chronic obstructive pulmonary disease) ([place])   Type 2 diabetes mellitus with other specified complication (HCC)   CKD (chronic kidney disease) stage 3, GFR 30-59 ml/min (HCC)   RLL pneumonia ([place])   Essential hypertension   Acute on chronic diastolic CHF (congestive heart failure) ([place])    Hyperbilirubinemia   Septic shock (HCC)   LOS: 14 days   A & P  Resolving septic shock secondary to severe community-acquired pneumonia : The patient presented with tachypnea and tachycardia with right lower lobe infiltrates on chest x-ray done on 09/10/2018.  Today is day 14/14 of prolonged course of antibiotics as patient was slow to respond due to severe community-acquired pneumonia. Blood cultures and urine cultures have had no growth. Repeat blood cultures still negative. AFB in process and pending. Infectious disease assistance is appreciated. She has been maintaining MAP greater than 65 for 3 days.  Elevated troponin: Peaked at 131 and trended down. Likely due to demand ischemia from tachycardia. Cardiology consulted. Myoview performed on 09/23/2018. Low risk study. EF 45 -54.   Nonsustained V. tach, asymptomatic: Denies any anginal symptoms. Continue to monitor on telemetry.  Acute blood loss in the setting of cold agglutinin antibodies. Hemoglobin stable at 10.4  after receiving one unit of PRBC;s on 09/21/2018. Iron studies unremarkable. FOBT is still pending. Monitor.  Acute on chronic hypoxic respiratory failure suspect multifactorial secondary to cavitary CAP, acute on chronic diastolic CHF, right pleural effusion, aspiration: the patient is requiring  On 2 L of oxygen as needed at baseline Increase in oxygen requirement on 09/18/18 HFNC >> Venturi mask Pulmonology reconsulted on 09/18/2018 ?aspiration, seen by speech therapy  Continue bronchodilators Continue to maintain O2 saturation greater than 92% PCCM has been consulted to assist with the management Currently on 3 L nasal cannula maintaining saturation in the mid 90s  Right pleural effusion suspect infective from CAP: Resolving.  Suspected dysphagia: SLP consultated. Cleared for regular diet with thin liquids by SLP. Aspiration precautions in place.  Cavitary community-acquired pneumonia, POA: Completed IV antibiotics  today. Pulmonary toilet as needed. Mucinex for cough as needed. Continue flutter valve as tolerated. AFB ordered, seen by infectious disease, signed off on 09/21/2018.  Chronic low back pain: Continue lidocaine patch.  Elevated d-dimer D-dimer 11 on 09/13/2018>> 2.3 on 09/18/18 No evidence of PE on CT chest no contrast Negative DVT in lower extremities bilaterally COVID 19-x2 during this admission  Hypotensive in the setting of septic shock. Resolved. Now normotensive without antihypertensives. Home atenolol has been held.  Significantly elevated BNP with concern for acute on chronic diastolic CHF: Likely due to elevated creatinine. Normal EF on 8/31/ echocardiogram.   Hypokalemia/hypophosphatemia/hypomagnesemia: Resolved. Monitor.  Isolated hyperbilirubinemia: Unclear etiology. 2.1 on 09/19/18. Alkaline phosphatase, AST and ALT normal.  Euvolemic hyponatremia: Resolved. Na 135 today. Monitor.  AKI on CKD 2: Baseline creatinine 1.1 with GFR greater than 60. Creatinien 1.14 today. Avoid nephrotoxins and hypotensions. Monitor creatinine, electrolytes, and volume status.  Resolved mild thrombocytopenia: Platelets are 235. Prior polysubstance abuse with benzodiazepine and cocaine: Last UDS on 05/04/2016+ for benzodiazepine and cocaine.  Chronic anxiety/depression: Resume Zoloft, trazodone, Neurontin when hemodynamically stable  COPD: Continue inhalers  Type 2 diabetes with hyperglycemia: The patient is receiving levemir and SSI. He is on a carbohydrate controlled diet and hypoglycemic protocol.  Physical debility/ambulatory dysfunction: PT/OT consulted. Plan is for dc to SNF. Fall precautions.  Code Status: DNR  Family Communication: None at bedside  Disposition Plan: Ongoing work-up by cardiology.  Awaiting bed placement at SNF.  CSW assisting with placement.  France Noyce, DO [facility] Direct contact: see [email or web address]  7PM-7AM contact night coverage as  above 09/24/2018, 4:29 PM  LOS: 14 days

## 2018-09-24 NOTE — Progress Notes (Signed)
  Speech Language Pathology Treatment: Dysphagia  Patient Details Name: Adrian Andrews MRN: [id number] DOB: [birth year] Today's Date: 09/24/2018 Time: 1208-1219 SLP Time Calculation (min) (ACUTE ONLY): 11 min  Assessment / Plan / Recommendation Clinical Impression  Pt was encountered awake/alert, sitting upright in chair with lunch meal tray present (regular solids/thin liquid).  Pt independently stated and demonstrated aspiration precautions including sitting upright 90 degrees, taking small bites/sips, slow rate of intake, and taking breaks when SOB.  Pt continues to present with a consistent delayed throat clear, however, he reported that it was baseline and that he continually clears his throat throughout the day.  Pt exhibited mildly prolonged, but effective mastication and no oral residue was observed.  Pt denied any difficulty with his current diet.  Pt is afebrile, WBC count is WNL, and CXR from 09/20/18 showed "Improved right lower lobe pneumonia".  Secondary to this and results from [facility] on 09/19/18 reporting no laryngeal penetration or aspiration with any consistencies, no further skilled ST is warranted at this time.  Recommend continuation of regular solids and thin liquid with use of aspiration precautions.  Please re-consult ST if further needs arise.    HPI HPI: DONEL OSOWSKI is a 68 y.o. male admitted 09/10/2018 with fever, SOB, and productive cough. PMH: CVA, carotid stenosis s/p B stents; HTN; HLD; DM; COPD on 3L home O2; stage 3 CKD, chronic back pain; No prior SLP intervention found. CXR = RLL PNA.       SLP Plan  All goals met       Recommendations  Diet recommendations: Regular;Thin liquid Liquids provided via: Cup;Straw Medication Administration: Whole meds with liquid Supervision: Patient able to self feed Compensations: Slow rate;Small sips/bites Postural Changes and/or Swallow Maneuvers: Seated upright 90 degrees;Upright 30-60 min after meal                Oral  Care Recommendations: Oral care BID Follow up Recommendations: None SLP Visit Diagnosis: Dysphagia, unspecified (R13.10) Plan: All goals met       GO                Elvia Collum Achille Xiang 09/24/2018, 12:42 PM

## 2018-09-24 NOTE — Progress Notes (Signed)
Physical Therapy Treatment Patient Details Name: Adrian Andrews MRN: [id number] DOB: Dec 16, 1950 Today's Date: 09/24/2018    History of Present Illness Adrian Andrews is a 68 y.o. male with medical history significant of CVA; carotid stenosis s/p B stents; HTN; HLD; DM; COPD on 2L home O2; and stage 3 CKD presenting with fever, cough, and diarrhea. Found to have Sepsis due to pneumonia    PT Comments    Pt a little melancholy today, but agreed to participate with therapy.  SpO2 unable to be maintained on RA and even 2-4 L [name] during gait, but at rest on 4L's , pt's sats wee adequate.   Follow Up Recommendations  SNF;Supervision/Assistance - 24 hour     Equipment Recommendations  Rolling walker with 5" wheels    Recommendations for Other Services       Precautions / Restrictions Precautions Precautions: Fall    Mobility  Bed Mobility Overal bed mobility: Needs Assistance Bed Mobility: Supine to Sit     Supine to sit: Min guard;HOB elevated     General bed mobility comments: increased time and WOB  Transfers Overall transfer level: Needs assistance Equipment used: Rolling walker (2 wheeled);None Transfers: Sit to/from Stand Sit to Stand: Min assist         General transfer comment: assist coming forward with minor boost.  Ambulation/Gait Ambulation/Gait assistance: Min guard Gait Distance (Feet): 90 Feet(x2 with chair follow) Assistive device: Rolling walker (2 wheeled) Gait Pattern/deviations: Step-through pattern;Decreased step length - right;Decreased step length - left;Decreased stride length Gait velocity: decr Gait velocity interpretation: 1.31 - 2.62 ft/sec, indicative of limited community ambulator General Gait Details: generally steady with light to moderate use of the RW.  Sats on 2-4 L consistently showing in the low to mid 80's.  Only with 4L [name] at rest did pt eventually attain 90%   Stairs             Wheelchair Mobility    Modified  Rankin (Stroke Patients Only)       Balance Overall balance assessment: Needs assistance   Sitting balance-Leahy Scale: Good       Standing balance-Leahy Scale: Fair Standing balance comment: statically can manage without AD, but prefering to use RW dynamically                            Cognition Arousal/Alertness: Awake/alert Behavior During Therapy: WFL for tasks assessed/performed Overall Cognitive Status: Within Functional Limits for tasks assessed(NT formally)                                        Exercises      General Comments        Pertinent Vitals/Pain Pain Assessment: Faces Faces Pain Scale: No hurt Pain Intervention(s): Monitored during session    Home Living                      Prior Function            PT Goals (current goals can now be found in the care plan section) Acute Rehab PT Goals Patient Stated Goal: thinking about rehab PT Goal Formulation: With patient Time For Goal Achievement: 09/25/18 Potential to Achieve Goals: Good Progress towards PT goals: Progressing toward goals    Frequency    Min 2X/week      PT  Plan Current plan remains appropriate    Co-evaluation PT/OT/SLP Co-Evaluation/Treatment: Yes Reason for Co-Treatment: For patient/therapist safety PT goals addressed during session: Mobility/safety with mobility OT goals addressed during session: Strengthening/ROM;ADL's and self-care      AM-PAC PT "6 Clicks" Mobility   Outcome Measure  Help needed turning from your back to your side while in a flat bed without using bedrails?: A Little Help needed moving from lying on your back to sitting on the side of a flat bed without using bedrails?: A Little Help needed moving to and from a bed to a chair (including a wheelchair)?: A Little Help needed standing up from a chair using your arms (e.g., wheelchair or bedside chair)?: A Little Help needed to walk in hospital room?: A  Little Help needed climbing 3-5 steps with a railing? : A Little 6 Click Score: 18    End of Session Equipment Utilized During Treatment: Oxygen Activity Tolerance: Patient limited by fatigue Patient left: in chair;with call bell/phone within reach;with chair alarm set Nurse Communication: Mobility status PT Visit Diagnosis: Other abnormalities of gait and mobility (R26.89);Muscle weakness (generalized) (M62.81)     Time: [id number] PT Time Calculation (min) (ACUTE ONLY): 28 min  Charges:  $Gait Training: 8-22 mins                     09/24/2018  Adrian Andrews, PT Acute Rehabilitation Services [phone]  (pager) [phone]  (office)   Adrian Andrews 09/24/2018, 4:02 PM

## 2018-09-24 NOTE — Progress Notes (Addendum)
Progress Note  Patient Name: Adrian Andrews Date of Encounter: 09/24/2018  Primary Cardiologist: Mackinsey Pelland (new)  Subjective   No complaints this am. Resting comfortably in bed.   Inpatient Medications    Scheduled Meds: . aspirin EC  81 mg Oral Daily  . atorvastatin  40 mg Oral QHS  . Chlorhexidine Gluconate Cloth  6 each Topical Daily  . enoxaparin (LOVENOX) injection  40 mg Subcutaneous Daily  . fluticasone  2 spray Each Nare Daily  . gabapentin  300 mg Oral q morning - 10a  . gabapentin  600 mg Oral QHS  . guaiFENesin  600 mg Oral BID  . influenza vaccine adjuvanted  0.5 mL Intramuscular Tomorrow-1000  . insulin aspart  0-15 Units Subcutaneous TID WC  . insulin aspart  0-5 Units Subcutaneous QHS  . insulin detemir  8 Units Subcutaneous BID  . ipratropium  0.5 mg Inhalation TID  . levalbuterol  1.25 mg Nebulization TID  . lidocaine  1 patch Transdermal Q24H  . mouth rinse  15 mL Mouth Rinse BID  . metoprolol tartrate  25 mg Oral BID  . pantoprazole  40 mg Oral BID  . senna-docusate  1 tablet Oral QHS  . sertraline  50 mg Oral Daily  . traZODone  50 mg Oral QHS   Continuous Infusions: . sodium chloride 250 mL (09/23/18 2144)  . phenylephrine (NEO-SYNEPHRINE) Adult infusion Stopped (09/19/18 0939)   PRN Meds: sodium chloride, albuterol, alum & mag hydroxide-simeth, lip balm, loperamide, LORazepam, ondansetron (ZOFRAN) IV, polyethylene glycol, sodium chloride, traMADol   Vital Signs    Vitals:   09/23/18 2049 09/23/18 2051 09/24/18 0435 09/24/18 0650  BP:  (!) 111/59 (!) 122/59   Pulse: 100 100 83   Resp: 20 20 20    Temp: 98.2 F (36.8 C) 98.6 F (37 C) (!) 97.5 F (36.4 C)   TempSrc: Oral Oral Oral   SpO2: 96% 96% 99%   Weight:    70.8 kg  Height:        Intake/Output Summary (Last 24 hours) at 09/24/2018 0805 Last data filed at 09/24/2018 0435 Gross per 24 hour  Intake 840 ml  Output 2527 ml  Net -1687 ml   Last 3 Weights 09/24/2018 09/22/2018  09/21/2018  Weight (lbs) 156 lb 1.6 oz 162 lb 1.6 oz 180 lb 12.4 oz  Weight (kg) 70.806 kg 73.528 kg 82 kg      Telemetry    NSR. No recurrent NSVT detected  - Personally Reviewed  ECG    No new EKG to review today - Personally Reviewed  Physical Exam   GEN: No acute distress.   Neck: No JVD Cardiac: RRR, no murmurs, rubs, or gallops.  Respiratory: Clear to auscultation bilaterally. GI: Soft, nontender, non-distended  MS: No edema; No deformity. Neuro:  Nonfocal  Psych: Normal affect   Labs    High Sensitivity Troponin:   Recent Labs  Lab 09/21/18 1506 09/21/18 1634 09/22/18 0708 09/22/18 1659 09/22/18 1915  TROPONINIHS 85* 82* 131* 99* 96*      Chemistry Recent Labs  Lab 09/18/18 0536 09/19/18 0639  09/22/18 0708 09/23/18 0349 09/24/18 0521  NA 134* 132*   < > 133* 138 135  K 3.7 3.8   < > 4.5 4.1 4.1  CL 99 95*   < > 100 101 101  CO2 28 26   < > 24 28 26   GLUCOSE 141* 177*   < > 145* 89 88  BUN 20 17   < >  10 10 13   CREATININE 1.15 1.23   < > 1.15 1.06 1.14  CALCIUM 7.8* 8.1*   < > 8.5* 9.0 8.6*  PROT 4.6* 5.3*  --   --   --   --   ALBUMIN 1.8* 2.4*  --   --   --   --   AST 17 20  --   --   --   --   ALT 22 24  --   --   --   --   ALKPHOS 62 67  --   --   --   --   BILITOT 1.8* 2.1*  --   --   --   --   GFRNONAA >60 60*   < > >60 >60 >60  GFRAA >60 >60   < > >60 >60 >60  ANIONGAP 7 11   < > 9 9 8    < > = values in this interval not displayed.     Hematology Recent Labs  Lab 09/22/18 0708 09/23/18 0349 09/24/18 0521  WBC 7.8 7.9 7.3  RBC 2.88* 3.21* 3.16*  HGB 10.2* 10.6* 10.4*  HCT 28.4* 31.3* 31.7*  MCV 98.6 97.5 100.3*  MCH 35.4* 33.0 32.9  MCHC 35.9 33.9 32.8  RDW 17.4* 17.2* 17.4*  PLT 258 261 235    BNPNo results for input(s): BNP, PROBNP in the last 168 hours.   DDimer  Recent Labs  Lab 09/18/18 1403  DDIMER 2.36*     Radiology    Nm Myocar Multi W/spect W/wall Motion / Ef  Result Date: 09/23/2018  There was no ST  segment deviation noted during stress.  Defect 1: There is a medium defect of mild severity.  Findings consistent with prior myocardial infarction with peri-infarct ischemia.  This is a low risk study.  Nuclear stress EF: 53%.  The left ventricular ejection fraction is mildly decreased (45-54%).  Low risk stress nuclear study with a mild, mostly fixed moderate-size inferior wall defect (suspect nontransmural infarction with minimal peri-infarct ischemia in the right coronary distribution). Low normal left ventricular global systolic function.   Cardiac Studies    Echo 09/14/2018 1. The left ventricle has normal systolic function, with an ejection fraction of 55-60%. The cavity size was normal. Left ventricular diastolic Doppler parameters are consistent with pseudonormalization. No evidence of left ventricular regional wall  motion abnormalities. 2. Mild calcification of the mitral valve leaflet. There is moderate mitral annular calcification present. No evidence of mitral valve stenosis. No significant mitral regurgitation. 3. The aortic valve is tricuspid. Mild calcification of the aortic valve. No stenosis of the aortic valve. 4. The aortic root is normal in size and structure. 5. The right ventricle has mildly reduced systolic function. The cavity was mildly enlarged. There is no increase in right ventricular wall thickness. Mildly D-shaped interventricular septum suggests a degree of RV pressure/volume overload. 6. The inferior vena cava was dilated in size with >50% respiratory variability. No complete TR doppler jet so unable to estimate PA systolic pressure.  NST 09/23/18 Nuclear Stress Findings  Nuclear Study Quality Overall image quality is good.  Diaphragmatic attenuation artifact was present.  Rest Perfusion There is a defect present in the basal inferior and mid inferior location.  Stress Perfusion There is a defect present in the basal inferior and mid inferior location.   Perfusion Summary Defect 1:  There is a medium defect of mild severity. The defect is partially reversible. Consistent with nontransmural scar with minimal peri-infarct ischemia.  Overall Study Impression Myocardial perfusion is abnormal.  Findings consistent with prior myocardial infarction with peri-infarct ischemia.  This is a low risk study.  Overall left ventricular systolic function was abnormal.    Nuclear stress EF:  53%.  The left ventricular ejection fraction is mildly decreased (45-54%).   There is no prior study for comparison.     Patient Profile     DENISON FARWELL a 68 y.o.malewith a hx of hypertension, hyperlipidemia, carotid artery disease status post left carotid endarterectomy, COPD, stroke, diabetes mellitus and prior tobacco smoker(quit4/2020)who is being seen today for the evaluation of elevated troponinat the request of Dr. [place] Crane.  Assessment & Plan    1. Elevated HS Troponin: Hs troponintrending up61>>85>>82>>131 after episode of NSVT. No associated anginal symptoms. Admit EKG showed transient ST depression in lead V3 to V5. Echocardiogram 8/31 showed preserved LV function without wall motion abnormality.   NST yesterday low risk  Troponin elevation not related to ACS. Suspect demand ischemia from PNA and anemia  No further cardiac w/u  2. NSVT: 27 beat run noted this admit. 2D echo w/ normal LVF. No increased LV wall thickness.  Electrolytes (K and Mg) have been stable. NST 9/9 low risk for coronary ischemia.  Telemetry reviewed. No recurrent NSVT. NSR. HR controlled  Earlier episode of NSVT likely self limited and will not require other therapy  Continue metoprolol 25 mg BID on d/c    3. CAP: management per primary team  4. Anemia:   Status post transfusion of 1unit of PRBCs>> Hgb improved and stable over the last 72 hrs at 10.4  Work up per primary team   Stable from a heart standpoint for d/c.   For questions or updates, please  contact [place] Please consult [email or web address] for contact info under        Signed, Lyda Jester, PA-C  09/24/2018, 8:05 AM    History and all data above reviewed.  Patient examined.  I agree with the findings as above.   Stress test as above. No acute cardiac complaints.  Baseline dyspnea and no chest pain.  The patient exam reveals COR:RRR  ,  Lungs: Mildly decreased breath sound but no crackles.  No wheezing  ,  Abd: Positive bowel sounds, no rebound no guarding, Ext No edema  .  All available labs, radiology testing, previous records reviewed. Agree with documented assessment and plan.   ELEVATED ENZYMES:  No acute coronary event suspected.  No further cardiac work up.  NSVT:  Incidental finding without symptoms.  No further work up.  Call with further questions.  We will sign off.   Jeneen Rinks Talor Cheema  11:08 AM  09/24/2018

## 2018-09-24 NOTE — Progress Notes (Signed)
Occupational Therapy Treatment Patient Details Name: Adrian Andrews MRN: [id number] DOB: [birth year] Today's Date: 09/24/2018    History of present illness Adrian Andrews is a 68 y.o. male with medical history significant of CVA; carotid stenosis s/p B stents; HTN; HLD; DM; COPD on 2L home O2; and stage 3 CKD presenting with fever, cough, and diarrhea. Found to have Sepsis due to pneumonia   OT comments  Pt progressing towards OT goals. Pt tearful at initiation of session. Able to perform bed mobility at min guard with increased time and effort, don socks EOB without assist utilizing figure 4 method, min A for transfers, min guard with grab bars for peri care and sink level grooming. Pt with elevated HR and decreased SpO2 with activity requiring increased supplemental O2 from 2-4L, and even on 4L desaturating into the low to mid 80's. Current POC remains appropriate.    Follow Up Recommendations  SNF;Supervision/Assistance - 24 hour    Equipment Recommendations  None recommended by OT    Recommendations for Other Services      Precautions / Restrictions Precautions Precautions: Fall Precaution Comments: watch O2 Restrictions Weight Bearing Restrictions: No       Mobility Bed Mobility Overal bed mobility: Needs Assistance Bed Mobility: Supine to Sit     Supine to sit: Min guard;HOB elevated     General bed mobility comments: increased time required  Transfers Overall transfer level: Needs assistance Equipment used: Rolling walker (2 wheeled);None Transfers: Sit to/from Stand Sit to Stand: Min assist         General transfer comment: assist coming forward with minor boost.    Balance Overall balance assessment: Needs assistance   Sitting balance-Leahy Scale: Good       Standing balance-Leahy Scale: Fair Standing balance comment: statically can manage without AD, but prefering to use RW dynamically                           ADL either performed or  assessed with clinical judgement   ADL Overall ADL's : Needs assistance/impaired Eating/Feeding: Independent;Sitting   Grooming: Minimal assistance;Standing;Wash/dry hands Grooming Details (indicate cue type and reason): DOE 3/4 with standing activity at sink, unable to get SpO2 reading but required seated rest break             Lower Body Dressing: Minimal assistance;Sit to/from stand Lower Body Dressing Details (indicate cue type and reason): able to don and doff socks EOB without assist, would need assist to manage items of clothing like pants Toilet Transfer: Min guard;RW;Ambulation;Cueing for safety   Toileting- Clothing Manipulation and Hygiene: Min guard;Sit to/from stand Toileting - Clothing Manipulation Details (indicate cue type and reason): use of grab bars for safety and balance - cues for safety with RW     Functional mobility during ADLs: Min guard;Rolling walker;Cueing for safety;Cueing for sequencing General ADL Comments: Pt initially on 2L O2, saturations dropped to low to mid 80's with activity (when we could get an SpO2 reading) while on 4L O2 via HFNC, HR increased dramatically during activity to 140's. Pt required cues for seated rest break     Vision   Vision Assessment?: No apparent visual deficits   Perception     Praxis      Cognition Arousal/Alertness: Awake/alert Behavior During Therapy: WFL for tasks assessed/performed Overall Cognitive Status: Impaired/Different from baseline Area of Impairment: Safety/judgement  Safety/Judgement: Decreased awareness of safety;Decreased awareness of deficits     General Comments: crying when talking about losing his familiy members [facility] at [facility], but no safety awareness of SpO2 and deficits        Exercises     Shoulder Instructions       General Comments      Pertinent Vitals/ Pain       Pain Assessment: No/denies pain Faces Pain Scale: No hurt Pain  Intervention(s): Monitored during session  Home Living                                          Prior Functioning/Environment              Frequency  Min 2X/week        Progress Toward Goals  OT Goals(current goals can now be found in the care plan section)  Progress towards OT goals: Progressing toward goals  Acute Rehab OT Goals Patient Stated Goal: thinking about rehab OT Goal Formulation: With patient Time For Goal Achievement: 09/25/18 Potential to Achieve Goals: Good  Plan Discharge plan remains appropriate;Frequency remains appropriate    Co-evaluation    PT/OT/SLP Co-Evaluation/Treatment: Yes Reason for Co-Treatment: For patient/therapist safety PT goals addressed during session: Mobility/safety with mobility;Balance;Proper use of DME OT goals addressed during session: ADL's and self-care      AM-PAC OT "6 Clicks" Daily Activity     Outcome Measure   Help from another person eating meals?: None Help from another person taking care of personal grooming?: A Little Help from another person toileting, which includes using toliet, bedpan, or urinal?: A Little Help from another person bathing (including washing, rinsing, drying)?: A Lot Help from another person to put on and taking off regular upper body clothing?: A Little Help from another person to put on and taking off regular lower body clothing?: A Little 6 Click Score: 18    End of Session Equipment Utilized During Treatment: Rolling walker;Oxygen(2-4L via HFNC)  OT Visit Diagnosis: Unsteadiness on feet (R26.81);Other abnormalities of gait and mobility (R26.89);Muscle weakness (generalized) (M62.81)   Activity Tolerance Patient tolerated treatment well   Patient Left in chair;with call bell/phone within reach;with chair alarm set   Nurse Communication Mobility status        Time: [id number] OT Time Calculation (min): 36 min  Charges: OT General Charges $OT Visit: 1  Visit OT Treatments $Self Care/Home Management : 8-22 mins  Hulda Humphrey OTR/L Acute Rehabilitation Services Pager: [phone] Office: [place] 09/24/2018, 5:15 PM

## 2018-09-24 NOTE — Progress Notes (Signed)
PASRR number remains pending.   [place], [place]

## 2018-09-25 LAB — BASIC METABOLIC PANEL
Anion gap: 9 (ref 5–15)
BUN: 17 mg/dL (ref 8–23)
CO2: 25 mmol/L (ref 22–32)
Calcium: 8.8 mg/dL — ABNORMAL LOW (ref 8.9–10.3)
Chloride: 98 mmol/L (ref 98–111)
Creatinine, Ser: 1.19 mg/dL (ref 0.61–1.24)
GFR calc Af Amer: >60 mL/min (ref >60)
GFR calc non Af Amer: >60 mL/min (ref >60)
Glucose, Bld: 174 mg/dL — ABNORMAL HIGH (ref 70–99)
Potassium: 4.7 mmol/L (ref 3.5–5.1)
Sodium: 132 mmol/L — ABNORMAL LOW (ref 135–145)

## 2018-09-25 LAB — GLUCOSE, CAPILLARY
Glucose-Capillary: 165 mg/dL — ABNORMAL HIGH (ref 70–99)
Glucose-Capillary: 176 mg/dL — ABNORMAL HIGH (ref 70–99)
Glucose-Capillary: 181 mg/dL — ABNORMAL HIGH (ref 70–99)
Glucose-Capillary: 234 mg/dL — ABNORMAL HIGH (ref 70–99)

## 2018-09-25 LAB — CBC
HCT: 27.9 % — ABNORMAL LOW (ref 39.0–52.0)
Hemoglobin: 9.6 g/dL — ABNORMAL LOW (ref 13.0–17.0)
MCH: 34.2 pg — ABNORMAL HIGH (ref 26.0–34.0)
MCHC: 34.4 g/dL (ref 30.0–36.0)
MCV: 99.3 fL (ref 80.0–100.0)
Platelets: 201 10*3/uL (ref 150–400)
RBC: 2.81 MIL/uL — ABNORMAL LOW (ref 4.22–5.81)
RDW: 17 % — ABNORMAL HIGH (ref 11.5–15.5)
WBC: 7.3 10*3/uL (ref 4.0–10.5)
nRBC: 0 % (ref 0.0–0.2)

## 2018-09-25 MED ORDER — ACETAMINOPHEN 325 MG PO TABS
650.0000 mg | ORAL_TABLET | Freq: Four times a day (QID) | ORAL | Status: DC | PRN
  Administered 2018-09-25 – 2018-09-27 (×2): 650 mg via ORAL
  Filled 2018-09-25 (×2): qty 2

## 2018-09-25 NOTE — TOC Progression Note (Signed)
Transition of Care [facility]) - Progression Note    Patient Details  Name: Adrian Andrews MRN: [id number] Date of Birth: October 31, 1950  Transition of Care [facility]) CM/SW [place], [place] Phone Number: [phone] 09/25/2018, 2:20 PM  Clinical Narrative:     CSW met with patient to complete [facility] admissions paperwork. Paperwork completed and faxed to [place].   PASRR continues to pend, [place] MUST liaison will reach out at 2:45 for assessment to be completed with  PASRR review.    Expected Discharge Plan: [place] Barriers to Discharge: Continued Medical Work up  Expected Discharge Plan and Services Expected Discharge Plan: [place] Choice: [place] arrangements for the past 2 months: Single Family Home                           HH Arranged: RN   Date HH Agency Contacted: 09/11/18 Time HH Agency Contacted: [id number] Representative spoke with at [place]: [place] ([place]) Interventions    Readmission Risk Interventions Readmission Risk Prevention Plan 09/11/2018  Transportation Screening Complete  HRI or Home Care Consult Complete  Some recent data might be hidden

## 2018-09-25 NOTE — Progress Notes (Signed)
PROGRESS NOTE  Adrian Andrews [id number] DOB: 10-10-1950 DOA: 09/10/2018 PCP: [name], MD  Brief History   68 year old male with history of CVA, carotid artery stenosis/stent, DM-2, CKD 3, HTN, HLD, COPD-3L and former smoker presenting with fever, productive cough, shortness of breath and diarrhea and admitted for sepsis due to RLL pneumonia, and AKI.  In ED, soft blood pressures. Lactic acidosis. CXR revealed RLL pneumonia. COVID-19 negative. Started on ceftriaxone, azithromycin and a steroid. PCCM consulted given soft blood pressure and uptrending lactic acidosis and recommended hydration.  Hospital course complicated by severe hypoxia requiring high flow nasal cannula.  CT chest done without contrast due to allergy showed no clear evidence of pulmonary embolism.  Revealed bilateral pulmonary infiltrates with increase in pulmonary vascularity.  Started on IV Lasix with improvement of hypoxia.  Elevated inflammatory markers with suspicion for COVID-19.  Repeated in-house COVID-19 negative.  2D echo done on 09/14/2018 unrevealing.  Showing normal LVEF 55 to 60%.  Hospital course complicated by respiratory distress on 09/18/2018 and severe hypotension requiring pressors secondary to septic shock in the setting of cavitary pneumonia.  Transferred to ICU on 09/18/2018.  Also anemia with drop in hemoglobin to 7.2.  Patient found to cold agglutinin antibodies requiring blood warming, therefore started to the ICU on 09/21/2018 for blood transfusion.  Sustained V. tach reported on 09/21/2010 EKG reveals ST depressions on V4 V5 and V6.  Troponin elevated and trended up.  Denies anginal symptoms.  Seen by cardiology and following. The patient underwent a Lexiscan Myoview was performed on 09/23/2018. It was a low risk study, but did demonstrated prior myocardial infarction with peri-infarct ischemia. EF mildly decreased at 45-54%.  Consultants  . PCCM, . Cardiology . Infectious Disease.   Procedures  . Myoview stress  Antibiotics   Anti-infectives (From admission, onward)   Start     Dose/Rate Route Frequency Ordered Stop   09/21/18 1230  ceFEPIme (MAXIPIME) 2 g in sodium chloride 0.9 % 100 mL IVPB     2 g 200 mL/hr over 30 Minutes Intravenous Every 12 hours 09/21/18 1211 09/23/18 2216   09/19/18 1815  azithromycin (ZITHROMAX) 500 mg in sodium chloride 0.9 % 250 mL IVPB     500 mg 250 mL/hr over 60 Minutes Intravenous Every 24 hours 09/19/18 1804 09/23/18 1801   09/18/18 1400  meropenem (MERREM) 1 g in sodium chloride 0.9 % 100 mL IVPB  Status:  Discontinued     1 g 200 mL/hr over 30 Minutes Intravenous Every 8 hours 09/18/18 1346 09/21/18 1210   09/12/18 1400  ceFEPIme (MAXIPIME) 2 g in sodium chloride 0.9 % 100 mL IVPB  Status:  Discontinued     2 g 200 mL/hr over 30 Minutes Intravenous Every 8 hours 09/12/18 1036 09/18/18 1345   09/10/18 0915  cefTRIAXone (ROCEPHIN) 2 g in sodium chloride 0.9 % 100 mL IVPB  Status:  Discontinued     2 g 200 mL/hr over 30 Minutes Intravenous Every 24 hours 09/10/18 0901 09/12/18 1036   09/10/18 0915  azithromycin (ZITHROMAX) 500 mg in sodium chloride 0.9 % 250 mL IVPB  Status:  Discontinued     500 mg 250 mL/hr over 60 Minutes Intravenous Every 24 hours 09/10/18 0901 09/18/18 0504     Subjective  The patient is resting comfortably. No new complaints.  Objective   Vitals:  Vitals:   09/25/18 0522 09/25/18 1211  BP: (!) 120/57 (!) 95/59  Pulse: 87 82  Resp: 20 17  Temp: 98.5  F (36.9 C) 98.3 F (36.8 C)  SpO2: 97% 90%    Exam:  Constitutional:  . The patient is awake, alert, and oriented x 3. No acute distress. Respiratory:  . No increased work of breathing. . No wheezes, rales, or rhonchi . No tactile fremitus. Cardiovascular:  . Regular rate and rhythm. . No murmurs, ectopy, or gallups.  . No lateral PMI. No thrills. Abdomen:  . Abdomen is soft, non-tender, non-distended. . No hernias, masses, or  organomegaly. . Normoactive bowel sounds Musculoskeletal:  . No cyanosis, clubbing, or edema Skin:  . No rashes, lesions, ulcers . palpation of skin: no induration or nodules Neurologic:  . CN 2-12 intact . Sensation all 4 extremities intact Psychiatric:  . Mental status o Mood, affect appropriate o Orientation to person, place, time  . judgment and insight appear intact    I have personally reviewed the following:   Today's Data  . Vitals, BMP, CBC  Scheduled Meds: . aspirin EC  81 mg Oral Daily  . atorvastatin  40 mg Oral QHS  . Chlorhexidine Gluconate Cloth  6 each Topical Daily  . enoxaparin (LOVENOX) injection  40 mg Subcutaneous Daily  . fluticasone  2 spray Each Nare Daily  . gabapentin  300 mg Oral q morning - 10a  . gabapentin  600 mg Oral QHS  . guaiFENesin  600 mg Oral BID  . influenza vaccine adjuvanted  0.5 mL Intramuscular Tomorrow-1000  . insulin aspart  0-15 Units Subcutaneous TID WC  . insulin aspart  0-5 Units Subcutaneous QHS  . insulin detemir  8 Units Subcutaneous BID  . ipratropium  0.5 mg Inhalation TID  . levalbuterol  1.25 mg Nebulization TID  . lidocaine  1 patch Transdermal Q24H  . mouth rinse  15 mL Mouth Rinse BID  . metoprolol tartrate  25 mg Oral BID  . pantoprazole  40 mg Oral BID  . senna-docusate  1 tablet Oral QHS  . sertraline  50 mg Oral Daily  . traZODone  50 mg Oral QHS   Continuous Infusions: . sodium chloride 250 mL (09/23/18 2144)  . phenylephrine (NEO-SYNEPHRINE) Adult infusion Stopped (09/19/18 0939)    Principal Problem:   Severe sepsis (HCC) Active Problems:   Acute respiratory failure with hypoxia (HCC)   COPD (chronic obstructive pulmonary disease) (HCC)   Type 2 diabetes mellitus with other specified complication (HCC)   CKD (chronic kidney disease) stage 3, GFR 30-59 ml/min (HCC)   RLL pneumonia (HCC)   Essential hypertension   Acute on chronic diastolic CHF (congestive heart failure) ([place])    Hyperbilirubinemia   Septic shock (HCC)   LOS: 15 days   A & P  Resolving septic shock secondary to severe community-acquired pneumonia : The patient presented with tachypnea and tachycardia with right lower lobe infiltrates on chest x-ray done on 09/10/2018.  Today is day 14/14 of prolonged course of antibiotics as patient was slow to respond due to severe community-acquired pneumonia. Blood cultures and urine cultures have had no growth. Repeat blood cultures still negative. AFB in process and pending. Infectious disease assistance is appreciated. No further hypotension.  Elevated troponin: Peaked at 131 and trended down. Likely due to demand ischemia from tachycardia. Cardiology consulted. Myoview performed on 09/23/2018. Low risk study. EF 45 -54.   Nonsustained V. tach, asymptomatic: Denies any anginal symptoms. Continue to monitor on telemetry.  Acute blood loss in the setting of cold agglutinin antibodies. Hemoglobin drifting down to 9.6 after receiving one unit  of PRBC;s on 09/21/2018. Iron studies unremarkable. FOBT is still pending. Monitor.  Acute on chronic hypoxic respiratory failure suspect multifactorial secondary to cavitary CAP, acute on chronic diastolic CHF, right pleural effusion, aspiration: the patient is saturating 90% on room air today. He is on 2 L of oxygen as needed at baseline.  Right pleural effusion suspect infective from CAP: Resolving.  Suspected dysphagia: SLP consultated. Cleared for regular diet with thin liquids by SLP. Aspiration precautions in place.  Cavitary community-acquired pneumonia, POA: Completed IV antibiotics today. Pulmonary toilet as needed. Mucinex for cough as needed. Continue flutter valve as tolerated. AFB ordered, seen by infectious disease, signed off on 09/21/2018.  Chronic low back pain: Continue lidocaine patch.  Elevated d-dimer: No evidence of PE on CT chest no contrast. Negative DVT in lower extremities bilaterally. COVID 19-x2  during this admission  Hypotensive in the setting of septic shock. Resolved. Now normotensive without antihypertensives. Home atenolol has been held.  Significantly elevated BNP with concern for acute on chronic diastolic CHF: Likely due to elevated creatinine. Normal EF on 8/31/ echocardiogram.   Hypokalemia/hypophosphatemia/hypomagnesemia: Resolved. Monitor.  Isolated hyperbilirubinemia: Unclear etiology. 2.1 on 09/19/18. Alkaline phosphatase, AST and ALT normal.  Euvolemic hyponatremia: Resolved. Na 132 today. Monitor.  AKI on CKD 2: Baseline creatinine 1.1 with GFR greater than 60. Creatinine 1.19 today. Avoid nephrotoxins and hypotensions. Monitor creatinine, electrolytes, and volume status.  Resolved mild thrombocytopenia: Platelets are 235.  Prior polysubstance abuse with benzodiazepine and cocaine: Last UDS on 05/04/2016+ for benzodiazepine and cocaine.  Chronic anxiety/depression: Resume Zoloft, trazodone, Neurontin when hemodynamically stable  COPD: Continue inhalers  Type 2 diabetes with hyperglycemia: The patient is receiving levemir and SSI. He is on a carbohydrate controlled diet and hypoglycemic protocol.  Physical debility/ambulatory dysfunction: PT/OT consulted. Plan is for dc to SNF. Fall precautions.  DVT Prophylaxis: Lovenox Code Status: DNR Family Communication: None at bedside Disposition Plan: Ongoing work-up by cardiology.  Awaiting bed placement at SNF.  CSW assisting with placement.  Adrian Prude, DO [facility] Direct contact: see [email or web address]  7PM-7AM contact night coverage as above  09/25/2018, 12:59 PM  LOS: 14 days

## 2018-09-26 LAB — GLUCOSE, CAPILLARY
Glucose-Capillary: 169 mg/dL — ABNORMAL HIGH (ref 70–99)
Glucose-Capillary: 240 mg/dL — ABNORMAL HIGH (ref 70–99)
Glucose-Capillary: 143 mg/dL — ABNORMAL HIGH (ref 70–99)
Glucose-Capillary: 277 mg/dL — ABNORMAL HIGH (ref 70–99)

## 2018-09-26 NOTE — Progress Notes (Signed)
PROGRESS NOTE  Adrian Andrews [id number] DOB: 07-19-1950 DOA: 09/10/2018 PCP: [name], MD  Brief History   69 year old male with history of CVA, carotid artery stenosis/stent, DM-2, CKD 3, HTN, HLD, COPD-3L and former smoker presenting with fever, productive cough, shortness of breath and diarrhea and admitted for sepsis due to RLL pneumonia, and AKI.  In ED, soft blood pressures. Lactic acidosis. CXR revealed RLL pneumonia. COVID-19 negative. Started on ceftriaxone, azithromycin and a steroid. PCCM consulted given soft blood pressure and uptrending lactic acidosis and recommended hydration.  Hospital course complicated by severe hypoxia requiring high flow nasal cannula.  CT chest done without contrast due to allergy showed no clear evidence of pulmonary embolism.  Revealed bilateral pulmonary infiltrates with increase in pulmonary vascularity.  Started on IV Lasix with improvement of hypoxia.  Elevated inflammatory markers with suspicion for COVID-19.  Repeated in-house COVID-19 negative.  2D echo done on 09/14/2018 unrevealing.  Showing normal LVEF 55 to 60%.  Hospital course complicated by respiratory distress on 09/18/2018 and severe hypotension requiring pressors secondary to septic shock in the setting of cavitary pneumonia.  Transferred to ICU on 09/18/2018.  Also anemia with drop in hemoglobin to 7.2.  Patient found to cold agglutinin antibodies requiring blood warming, therefore started to the ICU on 09/21/2018 for blood transfusion.  Sustained V. tach reported on 09/21/2010 EKG reveals ST depressions on V4 V5 and V6.  Troponin elevated and trended up.  Denies anginal symptoms.  Seen by cardiology and following. The patient underwent a Lexiscan Myoview was performed on 09/23/2018. It was a low risk study, but did demonstrated prior myocardial infarction with peri-infarct ischemia. EF mildly decreased at 45-54%.  Consultants  . PCCM, . Cardiology . Infectious Disease.   Procedures  . Myoview stress  Antibiotics   Anti-infectives (From admission, onward)   Start     Dose/Rate Route Frequency Ordered Stop   09/21/18 1230  ceFEPIme (MAXIPIME) 2 g in sodium chloride 0.9 % 100 mL IVPB     2 g 200 mL/hr over 30 Minutes Intravenous Every 12 hours 09/21/18 1211 09/23/18 2216   09/19/18 1815  azithromycin (ZITHROMAX) 500 mg in sodium chloride 0.9 % 250 mL IVPB     500 mg 250 mL/hr over 60 Minutes Intravenous Every 24 hours 09/19/18 1804 09/23/18 1801   09/18/18 1400  meropenem (MERREM) 1 g in sodium chloride 0.9 % 100 mL IVPB  Status:  Discontinued     1 g 200 mL/hr over 30 Minutes Intravenous Every 8 hours 09/18/18 1346 09/21/18 1210   09/12/18 1400  ceFEPIme (MAXIPIME) 2 g in sodium chloride 0.9 % 100 mL IVPB  Status:  Discontinued     2 g 200 mL/hr over 30 Minutes Intravenous Every 8 hours 09/12/18 1036 09/18/18 1345   09/10/18 0915  cefTRIAXone (ROCEPHIN) 2 g in sodium chloride 0.9 % 100 mL IVPB  Status:  Discontinued     2 g 200 mL/hr over 30 Minutes Intravenous Every 24 hours 09/10/18 0901 09/12/18 1036   09/10/18 0915  azithromycin (ZITHROMAX) 500 mg in sodium chloride 0.9 % 250 mL IVPB  Status:  Discontinued     500 mg 250 mL/hr over 60 Minutes Intravenous Every 24 hours 09/10/18 0901 09/18/18 0504     Subjective  The patient is resting comfortably. No new complaints.  Objective   Vitals:  Vitals:   09/26/18 1141 09/26/18 1456  BP: (!) 86/52   Pulse: 84   Resp: 19   Temp: 97.8 F (  36.6 C)   SpO2: 95% 94%    Exam:  Constitutional:  . The patient is awake, alert, and oriented x 3. No acute distress. Respiratory:  . No increased work of breathing. . No wheezes, rales, or rhonchi . No tactile fremitus. Cardiovascular:  . Regular rate and rhythm. . No murmurs, ectopy, or gallups.  . No lateral PMI. No thrills. Abdomen:  . Abdomen is soft, non-tender, non-distended. . No hernias, masses, or organomegaly. . Normoactive bowel sounds  Musculoskeletal:  . No cyanosis, clubbing, or edema Skin:  . No rashes, lesions, ulcers . palpation of skin: no induration or nodules Neurologic:  . CN 2-12 intact . Sensation all 4 extremities intact Psychiatric:  . Mental status o Mood, affect appropriate o Orientation to person, place, time  . judgment and insight appear intact    I have personally reviewed the following:   Today's Data  . Vitals, BMP, CBC  Scheduled Meds: . aspirin EC  81 mg Oral Daily  . atorvastatin  40 mg Oral QHS  . Chlorhexidine Gluconate Cloth  6 each Topical Daily  . enoxaparin (LOVENOX) injection  40 mg Subcutaneous Daily  . fluticasone  2 spray Each Nare Daily  . gabapentin  300 mg Oral q morning - 10a  . gabapentin  600 mg Oral QHS  . guaiFENesin  600 mg Oral BID  . influenza vaccine adjuvanted  0.5 mL Intramuscular Tomorrow-1000  . insulin aspart  0-15 Units Subcutaneous TID WC  . insulin aspart  0-5 Units Subcutaneous QHS  . insulin detemir  8 Units Subcutaneous BID  . ipratropium  0.5 mg Inhalation TID  . levalbuterol  1.25 mg Nebulization TID  . lidocaine  1 patch Transdermal Q24H  . mouth rinse  15 mL Mouth Rinse BID  . metoprolol tartrate  25 mg Oral BID  . pantoprazole  40 mg Oral BID  . senna-docusate  1 tablet Oral QHS  . sertraline  50 mg Oral Daily  . traZODone  50 mg Oral QHS   Continuous Infusions: . sodium chloride 250 mL (09/23/18 2144)  . phenylephrine (NEO-SYNEPHRINE) Adult infusion Stopped (09/19/18 0939)    Principal Problem:   Severe sepsis (HCC) Active Problems:   Acute respiratory failure with hypoxia (HCC)   COPD (chronic obstructive pulmonary disease) (HCC)   Type 2 diabetes mellitus with other specified complication (HCC)   CKD (chronic kidney disease) stage 3, GFR 30-59 ml/min (HCC)   RLL pneumonia (HCC)   Essential hypertension   Acute on chronic diastolic CHF (congestive heart failure) ([place])   Hyperbilirubinemia   Septic shock (HCC)   LOS: 16 days    A & P  Resolving septic shock secondary to severe community-acquired pneumonia : The patient presented with tachypnea and tachycardia with right lower lobe infiltrates on chest x-ray done on 09/10/2018.  Today is day 14/14 of prolonged course of antibiotics as patient was slow to respond due to severe community-acquired pneumonia. Blood cultures and urine cultures have had no growth. Repeat blood cultures still negative. AFB in process and pending. Infectious disease assistance is appreciated. No further hypotension.  Elevated troponin: Peaked at 131 and trended down. Likely due to demand ischemia from tachycardia. Cardiology consulted. Myoview performed on 09/23/2018. Low risk study. EF 45 -54.   Nonsustained V. tach, asymptomatic: Denies any anginal symptoms. Continue to monitor on telemetry.  Acute blood loss in the setting of cold agglutinin antibodies. Hemoglobin drifting down to 9.6 after receiving one unit of PRBC;s on 09/21/2018.  Iron studies unremarkable. FOBT is still pending. Monitor.  Acute on chronic hypoxic respiratory failure suspect multifactorial secondary to cavitary CAP, acute on chronic diastolic CHF, right pleural effusion, aspiration: the patient is saturating 94% on 3L today. He is on 2 L of oxygen as needed at baseline.  Right pleural effusion suspect infective from CAP: Resolving.  Suspected dysphagia: SLP consultated. Cleared for regular diet with thin liquids by SLP. Aspiration precautions in place.  Cavitary community-acquired pneumonia, POA: Completed IV antibiotics today. Pulmonary toilet as needed. Mucinex for cough as needed. Continue flutter valve as tolerated. AFB ordered, seen by infectious disease, signed off on 09/21/2018.  Chronic low back pain: Continue lidocaine patch.  Elevated d-dimer: No evidence of PE on CT chest no contrast. Negative DVT in lower extremities bilaterally. COVID 19-x2 during this admission  Hypotensive in the setting of septic  shock. Resolved. Now normotensive without antihypertensives. Home atenolol has been held.  Significantly elevated BNP with concern for acute on chronic diastolic CHF: Likely due to elevated creatinine. Normal EF on 8/31/ echocardiogram.   Hypokalemia/hypophosphatemia/hypomagnesemia: Resolved. Monitor.  Isolated hyperbilirubinemia: Unclear etiology. 2.1 on 09/19/18. Alkaline phosphatase, AST and ALT normal.  Euvolemic hyponatremia: Resolved. Na 132 today. Monitor.  AKI on CKD 2: Baseline creatinine 1.1 with GFR greater than 60. Creatinine 1.19 today. Avoid nephrotoxins and hypotensions. Monitor creatinine, electrolytes, and volume status.  Resolved mild thrombocytopenia: Platelets are 235.  Prior polysubstance abuse with benzodiazepine and cocaine: Last UDS on 05/04/2016+ for benzodiazepine and cocaine.  Chronic anxiety/depression: Resume Zoloft, trazodone, Neurontin when hemodynamically stable  COPD: Continue inhalers  Type 2 diabetes with hyperglycemia: The patient is receiving levemir and SSI. He is on a carbohydrate controlled diet and hypoglycemic protocol.  Physical debility/ambulatory dysfunction: PT/OT consulted. Plan is for dc to SNF. Fall precautions.  DVT Prophylaxis: Lovenox Code Status: DNR Family Communication: None at bedside Disposition Plan:  Awaiting SNF placement.  Hason Ofarrell, DO [facility] Direct contact: see [email or web address]  7PM-7AM contact night coverage as above  09/26/2018, 4:04 PM  LOS: 14 days

## 2018-09-27 LAB — GLUCOSE, CAPILLARY
Glucose-Capillary: 149 mg/dL — ABNORMAL HIGH (ref 70–99)
Glucose-Capillary: 313 mg/dL — ABNORMAL HIGH (ref 70–99)
Glucose-Capillary: 180 mg/dL — ABNORMAL HIGH (ref 70–99)
Glucose-Capillary: 293 mg/dL — ABNORMAL HIGH (ref 70–99)

## 2018-09-27 MED ORDER — METOPROLOL TARTRATE 12.5 MG HALF TABLET: 12.5000 mg | ORAL_TABLET | Freq: Once | ORAL | Status: DC

## 2018-09-27 MED ORDER — METOPROLOL TARTRATE 12.5 MG HALF TABLET
12.5000 mg | ORAL_TABLET | Freq: Two times a day (BID) | ORAL | Status: DC
  Administered 2018-09-27 – 2018-09-28 (×2): 12.5 mg via ORAL
  Filled 2018-09-27 (×2): qty 1

## 2018-09-27 NOTE — Plan of Care (Signed)

## 2018-09-27 NOTE — Progress Notes (Signed)
PROGRESS NOTE  Adrian Andrews [id number] DOB: [birth year] DOA: 09/10/2018 PCP: [name], MD  Brief History   68 year old male with history of CVA, carotid artery stenosis/stent, DM-2, CKD 3, HTN, HLD, COPD-3L and former smoker presenting with fever, productive cough, shortness of breath and diarrhea and admitted for sepsis due to RLL pneumonia, and AKI.  In ED, soft blood pressures. Lactic acidosis. CXR revealed RLL pneumonia. COVID-19 negative. Started on ceftriaxone, azithromycin and a steroid. PCCM consulted given soft blood pressure and uptrending lactic acidosis and recommended hydration.  Hospital course complicated by severe hypoxia requiring high flow nasal cannula.  CT chest done without contrast due to allergy showed no clear evidence of pulmonary embolism.  Revealed bilateral pulmonary infiltrates with increase in pulmonary vascularity.  Started on IV Lasix with improvement of hypoxia.  Elevated inflammatory markers with suspicion for COVID-19.  Repeated in-house COVID-19 negative.  2D echo done on 09/14/2018 unrevealing.  Showing normal LVEF 55 to 60%.  Hospital course complicated by respiratory distress on 09/18/2018 and severe hypotension requiring pressors secondary to septic shock in the setting of cavitary pneumonia.  Transferred to ICU on 09/18/2018.  Also anemia with drop in hemoglobin to 7.2.  Patient found to cold agglutinin antibodies requiring blood warming, therefore started to the ICU on 09/21/2018 for blood transfusion.  Sustained V. tach reported on 09/21/2010 EKG reveals ST depressions on V4 V5 and V6.  Troponin elevated and trended up.  Denies anginal symptoms.  Seen by cardiology and following. The patient underwent a Lexiscan Myoview was performed on 09/23/2018. It was a low risk study, but did demonstrated prior myocardial infarction with peri-infarct ischemia. EF mildly decreased at 45-54%.  Consultants  . PCCM, . Cardiology . Infectious Disease.   Procedures  . Myoview stress  Antibiotics   Anti-infectives (From admission, onward)   Start     Dose/Rate Route Frequency Ordered Stop   09/21/18 1230  ceFEPIme (MAXIPIME) 2 g in sodium chloride 0.9 % 100 mL IVPB     2 g 200 mL/hr over 30 Minutes Intravenous Every 12 hours 09/21/18 1211 09/23/18 2216   09/19/18 1815  azithromycin (ZITHROMAX) 500 mg in sodium chloride 0.9 % 250 mL IVPB     500 mg 250 mL/hr over 60 Minutes Intravenous Every 24 hours 09/19/18 1804 09/23/18 1801   09/18/18 1400  meropenem (MERREM) 1 g in sodium chloride 0.9 % 100 mL IVPB  Status:  Discontinued     1 g 200 mL/hr over 30 Minutes Intravenous Every 8 hours 09/18/18 1346 09/21/18 1210   09/12/18 1400  ceFEPIme (MAXIPIME) 2 g in sodium chloride 0.9 % 100 mL IVPB  Status:  Discontinued     2 g 200 mL/hr over 30 Minutes Intravenous Every 8 hours 09/12/18 1036 09/18/18 1345   09/10/18 0915  cefTRIAXone (ROCEPHIN) 2 g in sodium chloride 0.9 % 100 mL IVPB  Status:  Discontinued     2 g 200 mL/hr over 30 Minutes Intravenous Every 24 hours 09/10/18 0901 09/12/18 1036   09/10/18 0915  azithromycin (ZITHROMAX) 500 mg in sodium chloride 0.9 % 250 mL IVPB  Status:  Discontinued     500 mg 250 mL/hr over 60 Minutes Intravenous Every 24 hours 09/10/18 0901 09/18/18 0504     Subjective  The patient is resting comfortably. No new complaints.  Objective   Vitals:  Vitals:   09/27/18 1107 09/27/18 1325  BP: (!) 104/58   Pulse: 97 78  Resp: 16 18  Temp: 98.5 F (  36.9 C)   SpO2: 94%     Exam:  Constitutional:  . The patient is awake, alert, and oriented x 3. No acute distress. Respiratory:  . No increased work of breathing. . No wheezes, rales, or rhonchi . No tactile fremitus. Cardiovascular:  . Regular rate and rhythm. . No murmurs, ectopy, or gallups.  . No lateral PMI. No thrills. Abdomen:  . Abdomen is soft, non-tender, non-distended. . No hernias, masses, or organomegaly. . Normoactive bowel  sounds Musculoskeletal:  . No cyanosis, clubbing, or edema Skin:  . No rashes, lesions, ulcers . palpation of skin: no induration or nodules Neurologic:  . CN 2-12 intact . Sensation all 4 extremities intact Psychiatric:  . Mental status o Mood, affect appropriate o Orientation to person, place, time  . judgment and insight appear intact    I have personally reviewed the following:   Today's Data  . Vitals, BMP, CBC  Scheduled Meds: . aspirin EC  81 mg Oral Daily  . atorvastatin  40 mg Oral QHS  . Chlorhexidine Gluconate Cloth  6 each Topical Daily  . enoxaparin (LOVENOX) injection  40 mg Subcutaneous Daily  . fluticasone  2 spray Each Nare Daily  . gabapentin  300 mg Oral q morning - 10a  . gabapentin  600 mg Oral QHS  . guaiFENesin  600 mg Oral BID  . influenza vaccine adjuvanted  0.5 mL Intramuscular Tomorrow-1000  . insulin aspart  0-15 Units Subcutaneous TID WC  . insulin aspart  0-5 Units Subcutaneous QHS  . insulin detemir  8 Units Subcutaneous BID  . ipratropium  0.5 mg Inhalation TID  . levalbuterol  1.25 mg Nebulization TID  . lidocaine  1 patch Transdermal Q24H  . mouth rinse  15 mL Mouth Rinse BID  . metoprolol tartrate  12.5 mg Oral BID  . pantoprazole  40 mg Oral BID  . senna-docusate  1 tablet Oral QHS  . sertraline  50 mg Oral Daily  . traZODone  50 mg Oral QHS   Continuous Infusions: . sodium chloride 250 mL (09/23/18 2144)  . phenylephrine (NEO-SYNEPHRINE) Adult infusion Stopped (09/19/18 0939)    Principal Problem:   Severe sepsis (HCC) Active Problems:   Acute respiratory failure with hypoxia (HCC)   COPD (chronic obstructive pulmonary disease) (HCC)   Type 2 diabetes mellitus with other specified complication (HCC)   CKD (chronic kidney disease) stage 3, GFR 30-59 ml/min (HCC)   RLL pneumonia (HCC)   Essential hypertension   Acute on chronic diastolic CHF (congestive heart failure) ([place])   Hyperbilirubinemia   Septic shock (HCC)   LOS:  17 days   A & P  Resolving septic shock secondary to severe community-acquired pneumonia : The patient presented with tachypnea and tachycardia with right lower lobe infiltrates on chest x-ray done on 09/10/2018.  Today is day 14/14 of prolonged course of antibiotics as patient was slow to respond due to severe community-acquired pneumonia. Blood cultures and urine cultures have had no growth. Repeat blood cultures still negative. AFB in process and pending. Infectious disease assistance is appreciated. No further hypotension.  Elevated troponin: Peaked at 131 and trended down. Likely due to demand ischemia from tachycardia. Cardiology consulted. Myoview performed on 09/23/2018. Low risk study. EF 45 -54.   Nonsustained V. tach, asymptomatic: Denies any anginal symptoms. Continue to monitor on telemetry.  Acute blood loss in the setting of cold agglutinin antibodies. Hemoglobin drifting down to 9.6 after receiving one unit of PRBC;s on 09/21/2018.  Iron studies unremarkable. FOBT is still pending. Monitor.  Acute on chronic hypoxic respiratory failure suspect multifactorial secondary to cavitary CAP, acute on chronic diastolic CHF, right pleural effusion, aspiration: the patient is saturating 94% on 3L today. He is on 2 L of oxygen as needed at baseline.  Right pleural effusion suspect infective from CAP: Resolving.  Suspected dysphagia: SLP consultated. Cleared for regular diet with thin liquids by SLP. Aspiration precautions in place.  Cavitary community-acquired pneumonia, POA: Completed IV antibiotics today. Pulmonary toilet as needed. Mucinex for cough as needed. Continue flutter valve as tolerated. AFB ordered, seen by infectious disease, signed off on 09/21/2018.  Chronic low back pain: Continue lidocaine patch.  Elevated d-dimer: No evidence of PE on CT chest no contrast. Negative DVT in lower extremities bilaterally. COVID 19-x2 during this admission  Hypotensive in the setting of  septic shock. Septic shock resolved, but patient is again hypotensive since yesterday. I have reduced his dose of metoprolol to 12.5 bid from 25 mg.   Significantly elevated BNP with concern for acute on chronic diastolic CHF: Likely due to elevated creatinine. Normal EF on 8/31/ echocardiogram.   Hypokalemia/hypophosphatemia/hypomagnesemia: Resolved. Monitor.  Isolated hyperbilirubinemia: Unclear etiology. 2.1 on 09/19/18. Alkaline phosphatase, AST and ALT normal.  Euvolemic hyponatremia: Resolved. Na 132 today. Monitor.  AKI on CKD 2: Baseline creatinine 1.1 with GFR greater than 60. Creatinine 1.19 today. Avoid nephrotoxins and hypotensions. Monitor creatinine, electrolytes, and volume status.  Resolved mild thrombocytopenia: Platelets are 201.  Prior polysubstance abuse with benzodiazepine and cocaine: Last UDS on 05/04/2016+ for benzodiazepine and cocaine.  Chronic anxiety/depression: Resume Zoloft, trazodone, Neurontin when hemodynamically stable  COPD: Continue inhalers  Type 2 diabetes with hyperglycemia: The patient is receiving levemir and SSI. He is on a carbohydrate controlled diet and hypoglycemic protocol.  Physical debility/ambulatory dysfunction: PT/OT consulted. Plan is for dc to SNF. Fall precautions.  I have seen and examined this patient myself. I have spent 32 minutes in his evaluation and care.  DVT Prophylaxis: Lovenox Code Status: DNR Family Communication: None at bedside Disposition Plan:  Awaiting SNF placement.  Jyron Turman, DO [facility] Direct contact: see [email or web address]  7PM-7AM contact night coverage as above  09/27/2018, 2:26 PM  LOS: 14 days

## 2018-09-27 NOTE — Progress Notes (Addendum)
   09/27/18 0905  MEWS Assessment  Is this an acute change? Yes  MEWS guidelines implemented *See [place]  Provider Notification  Provider Name/Title Sr Swayze  Date Provider Notified 09/27/18  Time Provider Notified 0900  Notification Type Call  Date of Provider Response 09/27/18   Mews score is 2 due to elevated HR 104. Continue to monitor patient.

## 2018-09-27 NOTE — Progress Notes (Deleted)
   09/27/18 0905  MEWS Assessment  Is this an acute change? Yes

## 2018-09-28 DIAGNOSIS — R6520 Severe sepsis without septic shock: Secondary | ICD-10-CM | POA: Diagnosis not present

## 2018-09-28 DIAGNOSIS — A419 Sepsis, unspecified organism: Secondary | ICD-10-CM | POA: Diagnosis not present

## 2018-09-28 DIAGNOSIS — F411 Generalized anxiety disorder: Secondary | ICD-10-CM | POA: Diagnosis not present

## 2018-09-28 DIAGNOSIS — I5033 Acute on chronic diastolic (congestive) heart failure: Secondary | ICD-10-CM | POA: Diagnosis present

## 2018-09-28 DIAGNOSIS — R41841 Cognitive communication deficit: Secondary | ICD-10-CM | POA: Diagnosis present

## 2018-09-28 DIAGNOSIS — M545 Low back pain: Secondary | ICD-10-CM | POA: Diagnosis not present

## 2018-09-28 DIAGNOSIS — E1122 Type 2 diabetes mellitus with diabetic chronic kidney disease: Secondary | ICD-10-CM | POA: Diagnosis not present

## 2018-09-28 DIAGNOSIS — I959 Hypotension, unspecified: Secondary | ICD-10-CM | POA: Diagnosis not present

## 2018-09-28 DIAGNOSIS — M6281 Muscle weakness (generalized): Secondary | ICD-10-CM | POA: Diagnosis present

## 2018-09-28 DIAGNOSIS — J189 Pneumonia, unspecified organism: Secondary | ICD-10-CM | POA: Diagnosis not present

## 2018-09-28 DIAGNOSIS — Z7401 Bed confinement status: Secondary | ICD-10-CM | POA: Diagnosis not present

## 2018-09-28 DIAGNOSIS — R262 Difficulty in walking, not elsewhere classified: Secondary | ICD-10-CM | POA: Diagnosis present

## 2018-09-28 DIAGNOSIS — R5381 Other malaise: Secondary | ICD-10-CM | POA: Diagnosis not present

## 2018-09-28 DIAGNOSIS — J90 Pleural effusion, not elsewhere classified: Secondary | ICD-10-CM | POA: Diagnosis not present

## 2018-09-28 DIAGNOSIS — M2550 Pain in unspecified joint: Secondary | ICD-10-CM | POA: Diagnosis not present

## 2018-09-28 DIAGNOSIS — F329 Major depressive disorder, single episode, unspecified: Secondary | ICD-10-CM | POA: Diagnosis not present

## 2018-09-28 DIAGNOSIS — I472 Ventricular tachycardia: Secondary | ICD-10-CM | POA: Diagnosis present

## 2018-09-28 DIAGNOSIS — I110 Hypertensive heart disease with heart failure: Secondary | ICD-10-CM | POA: Diagnosis not present

## 2018-09-28 DIAGNOSIS — R6521 Severe sepsis with septic shock: Secondary | ICD-10-CM | POA: Diagnosis present

## 2018-09-28 LAB — CULTURE, BLOOD (ROUTINE X 2)
Culture: NO GROWTH
Culture: NO GROWTH

## 2018-09-28 LAB — GLUCOSE, CAPILLARY
Glucose-Capillary: 151 mg/dL — ABNORMAL HIGH (ref 70–99)
Glucose-Capillary: 267 mg/dL — ABNORMAL HIGH (ref 70–99)

## 2018-09-28 LAB — SARS CORONAVIRUS 2 BY RT PCR (HOSPITAL ORDER, PERFORMED IN ~~LOC~~ HOSPITAL LAB): SARS Coronavirus 2: NEGATIVE

## 2018-09-28 MED ORDER — GABAPENTIN 300 MG PO CAPS: 600.0000 mg | ORAL_CAPSULE | Freq: Every day | ORAL | 0 refills | Status: DC

## 2018-09-28 MED ORDER — METOPROLOL TARTRATE 25 MG PO TABS: 12.5000 mg | ORAL_TABLET | Freq: Two times a day (BID) | ORAL | 0 refills | Status: DC

## 2018-09-28 MED ORDER — FLUTICASONE PROPIONATE 50 MCG/ACT NA SUSP: 2.0000 | Freq: Every day | NASAL | 0 refills | Status: AC

## 2018-09-28 MED ORDER — TRAMADOL HCL 50 MG PO TABS: 50.0000 mg | ORAL_TABLET | Freq: Every day | ORAL | 0 refills | Status: DC | PRN

## 2018-09-28 MED ORDER — POLYETHYLENE GLYCOL 3350 17 G PO PACK: 17.0000 g | PACK | Freq: Every day | ORAL | 0 refills | Status: AC | PRN

## 2018-09-28 MED ORDER — GABAPENTIN 300 MG PO CAPS: 300.0000 mg | ORAL_CAPSULE | Freq: Every morning | ORAL | 0 refills | Status: AC

## 2018-09-28 MED ORDER — PANTOPRAZOLE SODIUM 40 MG PO TBEC: 40.0000 mg | DELAYED_RELEASE_TABLET | Freq: Two times a day (BID) | ORAL | 0 refills | Status: AC

## 2018-09-28 MED ORDER — ACETAMINOPHEN 325 MG PO TABS: 650.0000 mg | ORAL_TABLET | Freq: Four times a day (QID) | ORAL | 0 refills | Status: AC | PRN

## 2018-09-28 MED ORDER — INSULIN DETEMIR 100 UNIT/ML ~~LOC~~ SOLN
12.0000 [IU] | Freq: Two times a day (BID) | SUBCUTANEOUS | Status: DC
  Administered 2018-09-28: 12:00:00 12 [IU] via SUBCUTANEOUS
  Filled 2018-09-28 (×2): qty 0.12

## 2018-09-28 MED ORDER — IPRATROPIUM BROMIDE 0.02 % IN SOLN: 0.5000 mg | Freq: Two times a day (BID) | RESPIRATORY_TRACT | Status: DC

## 2018-09-28 MED ORDER — LEVALBUTEROL HCL 1.25 MG/0.5ML IN NEBU: 1.2500 mg | INHALATION_SOLUTION | Freq: Two times a day (BID) | RESPIRATORY_TRACT | Status: DC

## 2018-09-28 NOTE — Progress Notes (Signed)
Inpatient Diabetes Program Recommendations  AACE/ADA: New Consensus Statement on Inpatient Glycemic Control (2015)  Target Ranges:  Prepandial:   less than 140 mg/dL      Peak postprandial:   less than 180 mg/dL (1-2 hours)      Critically ill patients:  140 - 180 mg/dL   Lab Results  Component Value Date   GLUCAP 151 (H) 09/28/2018   HGBA1C 7.6 (H) 09/10/2018    Review of Glycemic Control Results for VI, KANAN (MRN [id number]) as of 09/28/2018 09:08  Ref. Range 09/27/2018 06:16 09/27/2018 11:04 09/27/2018 16:33 09/27/2018 21:09 09/28/2018 07:08  Glucose-Capillary Latest Ref Range: 70 - 99 mg/dL 149 (H) 313 (H) 180 (H) 293 (H) 151 (H)   Diabetes history: DM 2 Outpatient Diabetes medications: Glipizide 10 mg bid, Actos 15 mg Daily Current orders for Inpatient glycemic control:  Levemir 12 units bid (changed this am) Novolog 0-15 units tid Novolog 0-5 units qhs  Inpatient Diabetes Program Recommendations:    Fasting glucose at goal postprandials increase significantly in the 200-300 range. Consider Novolog 2-3 units tid meal coverage with parameters   (Pt must consumes at least 50% of meals and glucose at least 80 mg/dl.)  Thanks,  Tama Headings RN, MSN, BC-ADM Inpatient Diabetes Coordinator Team Pager [phone] (8a-5p)

## 2018-09-28 NOTE — Progress Notes (Addendum)
PASRR number received: [id number] F  Patient able to dc to [facility] once COVID updated test completed and results back.    [place], [place]

## 2018-09-28 NOTE — Care Management Important Message (Signed)
Important Message  Patient Details  Name: Adrian Andrews MRN: [id number] Date of Birth: 10-Sep-1950   Medicare Important Message Given:  Yes     Shelda Altes 09/28/2018, 1:34 PM

## 2018-09-28 NOTE — Plan of Care (Signed)

## 2018-09-28 NOTE — Progress Notes (Signed)
Completing rounds on floor and stopped to see Adrian Andrews. He expressed uneasiness around going to the rehab facility. Offered prayer, spiritual presence and listening ear to Adrian Andrews.

## 2018-09-28 NOTE — Discharge Summary (Signed)
Physician Discharge Summary  [name] [id number] DOB: 04-16-1950 DOA: 09/10/2018  PCP: [name], MD  Admit date: 09/10/2018 Discharge date: 09/28/2018  Recommendations for Outpatient Follow-up:  1. Follow up with PCP in 7-10 days. 2. Have chemistry checked in one week and reported to PCP. 3. Follow up with cardiology in 2 weeks.   Contact information for follow-up providers    [name], MD. Call in 1 day(s).   Specialty: Family Medicine Why: please call for a post hospital follow up appointment. Contact information: [address] [place] [address] [phone]            Contact information for after-discharge care    Destination    [facility] .   Service: Skilled Nursing Contact information: [place] [phone]                   Discharge Diagnoses: Principal diagnosis is #1 1. Resolving setptic chock 2. Elevated troponin 3. NSVT 4. Acute blood loss anemia with cold agglutinin antibioties. 5. Acute on chronic hypoxic respiratory failure 6. Right pleural efusion 7. Cavitary community acquired pneumonia 8. Chronic low back pain 9. CKD III 10. Hypokalemia, hypophosphatemia 11. Debility 12. Ambulator dysfunction  Discharge Condition: Fair Disposition: SNF  Diet recommendation: Heart healthy  Filed Weights   09/26/18 0528 09/27/18 0446 09/28/18 0447  Weight: 70.2 kg 70.9 kg 71.5 kg    History of present illness:  Adrian Andrews is a 68 y.o. male with medical history significant of CVA; carotid stenosis s/p B stents; HTN; HLD; DM; COPD on 2L home O2; and stage 3 CKD presenting with fever. He reports that his back is hurting, has chronic pain; he has not had his home meds yet.  He developed a fever a few days ago.  +cough - productive of a small amount of light colored mucus.  +SOB for several days.  He wears 3L home O2 and did not increase that.   No wheezing.  He has also had diarrhea for a couple of days; he has had this in the past with PNA.  He took some Imodium AD and hasn't had diarrhea since, but was having 3-4 stools per day prior.  He was last admitted in 01/2018 for acute COPD exacerbation.  ED Course:  Baseline COPD, presenting with RLLL CAP.  COVID negative.  Fever, cough, diarrhea x 2 days, lactate 2.8, 1 BP 90/78 and all otherwise ok.  Given Rocephin/Azithromycin.  Hospital Course:  68 year old male with history of CVA, carotid artery stenosis/stent, DM-2, CKD 3, HTN, HLD, COPD-3L and former smoker presenting with fever, productive cough, shortness of breath and diarrhea and admitted for sepsis due to RLL pneumonia, and AKI.  In ED, soft blood pressures. Lactic acidosis. CXR revealed RLL pneumonia. COVID-19 negative. Started on ceftriaxone, azithromycin and a steroid. PCCM consulted given soft blood pressure and uptrending lactic acidosis and recommended hydration.  Hospital course complicated by severe hypoxia requiring high flow nasal cannula. CT chest done without contrast due to allergy showed no clear evidence of pulmonary embolism. Revealed bilateral pulmonary infiltrates with increase in pulmonary vascularity. Started on IV Lasix with improvement of hypoxia.  Elevated inflammatory markers with suspicion for COVID-19. Repeated in-house COVID-19 negative.  2D echo done on 09/14/2018 unrevealing. Showing normal LVEF 55 to 60%.  Hospital course complicated by respiratory distress on 09/18/2018 and severe hypotension requiring pressors secondary to septic shock in the setting of cavitary pneumonia.  Transferred to ICU on 09/18/2018.  Also anemia with drop in hemoglobin to 7.2. Patient found to cold agglutinin antibodies requiring blood warming, therefore started to the ICU on 09/21/2018 for blood transfusion. Sustained V. tach reported on 09/21/2010 EKG reveals ST depressions on V4 V5 and V6. Troponin elevated and  trended up. Denies anginal symptoms. Seen by cardiology and following. The patient underwent a Lexiscan Myoview was performed on 09/23/2018. It was a low risk study, but did demonstrated prior myocardial infarction with peri-infarct ischemia. EF mildly decreased at 45-54%.  Today's assessment: S: the patient is resting comfortably. No acute distress. O: Vitals:  Vitals:   09/28/18 0447 09/28/18 0855  BP: (!) 108/57   Pulse: 87   Resp: 17   Temp: 98 F (36.7 C)   SpO2: 94% 92%    Exam:  Constitutional:   The patient is awake, alert, and oriented x 3. No acute distress. Respiratory:   No increased work of breathing.  No wheezes, rales, or rhonchi  No tactile fremitus Cardiovascular:   Regular rate and rhythm  No murmurs, ectopy, or gallups.  No lateral PMI. No thrills. Abdomen:   Abdomen is soft, non-tender, non-distended  No hernias, masses, or organomegaly  Normoactive bowel sounds.  Musculoskeletal:   No cyanosis, clubbing, or edema Skin:   No rashes, lesions, ulcers  palpation of skin: no induration or nodules Neurologic:   CN 2-12 intact  Sensation all 4 extremities intact Psychiatric:   Mental status o Mood, affect appropriate o Orientation to person, place, time   judgment and insight appear intact    Discharge Instructions  Discharge Instructions    (HEART FAILURE PATIENTS) Call MD:  Anytime you have any of the following symptoms: 1) 3 pound weight gain in 24 hours or 5 pounds in 1 week 2) shortness of breath, with or without a dry hacking cough 3) swelling in the hands, feet or stomach 4) if you have to sleep on extra pillows at night in order to breathe.   Complete by: As directed    Call MD for:  difficulty breathing, headache or visual disturbances   Complete by: As directed    Call MD for:  extreme fatigue   Complete by: As directed    Call MD for:  temperature >100.4   Complete by: As directed    Diet - low sodium heart healthy    Complete by: As directed    Discharge instructions   Complete by: As directed    Follow up with PCP in 7-10 days. Have chemistry checked in one week and reported to PCP. Follow up with cardiology in 2 weeks.   Heart Failure patients record your daily weight using the same scale at the same time of day   Complete by: As directed    Increase activity slowly   Complete by: As directed      Allergies as of 09/28/2018      Reactions   Clindamycin/lincomycin    Throat tightens up   Contrast Media [iodinated Diagnostic Agents] Shortness Of Breath   11/20/2004 reaction with CTA CAP   Metformin And Related Other (See Comments)   Effected kidney function      Medication List    STOP taking these medications   atenolol 50 MG tablet Commonly known as: TENORMIN     TAKE these medications   acetaminophen 325 MG tablet Commonly known as: TYLENOL Take 2 tablets (650 mg total) by mouth every 6 (six) hours as needed for  mild pain or headache.   albuterol 108 (90 Base) MCG/ACT inhaler Commonly known as: VENTOLIN HFA Inhale 2 puffs into the lungs every 6 (six) hours as needed for wheezing or shortness of breath.   aspirin EC 81 MG tablet Take 81 mg by mouth daily.   aspirin-acetaminophen-caffeine [id number] MG tablet Commonly known as: EXCEDRIN MIGRAINE Take by mouth every 6 (six) hours as needed for headache.   atorvastatin 40 MG tablet Commonly known as: LIPITOR Take 20 mg by mouth at bedtime.   Bevespi Aerosphere 9-4.8 MCG/ACT Aero Generic drug: Glycopyrrolate-Formoterol Inhale 2 puffs into the lungs 2 (two) times daily.   fluticasone 50 MCG/ACT nasal spray Commonly known as: FLONASE Place 2 sprays into both nostrils daily. Start taking on: September 29, 2018   gabapentin 300 MG capsule Commonly known as: NEURONTIN Take 2 capsules (600 mg total) by mouth at bedtime. What changed: how much to take   gabapentin 300 MG capsule Commonly known as: NEURONTIN Take 1 capsule  (300 mg total) by mouth every morning. Start taking on: September 29, 2018 What changed: You were already taking a medication with the same name, and this prescription was added. Make sure you understand how and when to take each.   glipiZIDE 10 MG tablet Commonly known as: GLUCOTROL Take 1 tablet (10 mg total) by mouth 2 (two) times daily before a meal.   guaiFENesin 600 MG 12 hr tablet Commonly known as: MUCINEX Take 1 tablet (600 mg total) by mouth 2 (two) times daily.   metoprolol tartrate 25 MG tablet Commonly known as: LOPRESSOR Take 0.5 tablets (12.5 mg total) by mouth 2 (two) times daily.   pantoprazole 40 MG tablet Commonly known as: PROTONIX Take 1 tablet (40 mg total) by mouth 2 (two) times daily.   pioglitazone 15 MG tablet Commonly known as: ACTOS Take 1 tablet (15 mg total) by mouth daily.   polyethylene glycol 17 g packet Commonly known as: MIRALAX / GLYCOLAX Take 17 g by mouth daily as needed for moderate constipation.   sertraline 50 MG tablet Commonly known as: ZOLOFT Take 50 mg by mouth daily.   traMADol 50 MG tablet Commonly known as: ULTRAM Take 1 tablet (50 mg total) by mouth daily as needed for severe pain (arthritis). What changed:   when to take this  reasons to take this   traZODone 50 MG tablet Commonly known as: DESYREL Take 50 mg by mouth at bedtime.            Durable Medical Equipment  (From admission, onward)         Start     Ordered   09/18/18 0456  For home use only DME oxygen  Once    Question Answer Comment  Length of Need 6 Months   Mode or (Route) Nasal cannula   Liters per Minute 4   Frequency Continuous (stationary and portable oxygen unit needed)   Oxygen conserving device Yes   Oxygen delivery system Gas      09/18/18 0456         Allergies  Allergen Reactions   Clindamycin/Lincomycin     Throat tightens up   Contrast Media [Iodinated Diagnostic Agents] Shortness Of Breath    11/20/2004 reaction  with CTA CAP   Metformin And Related Other (See Comments)    Effected kidney function    The results of significant diagnostics from this hospitalization (including imaging, microbiology, ancillary and laboratory) are listed below for reference.    Significant Diagnostic  Studies: Ct Chest Wo Contrast  Result Date: 09/13/2018 CLINICAL DATA:  Interstitial lung disease. EXAM: CT CHEST WITHOUT CONTRAST TECHNIQUE: Multidetector CT imaging of the chest was performed following the standard protocol without IV contrast. COMPARISON:  Chest x-ray September 13, 2018. FINDINGS: Cardiovascular: The thoracic aorta is nonaneurysmal with atherosclerotic change. Central pulmonary arteries are normal in caliber. Coronary artery calcifications affect the left and right coronary arteries. The heart size borderline. Mediastinum/Nodes: The thyroid and esophagus are unremarkable. Shotty nodes in the mediastinum without gross adenopathy are likely reactive. Small pleural effusions are seen, right greater than left. The chest wall is normal. Lungs/Pleura: Mild debris dependently in the trachea. Central airways are otherwise normal. No pneumothorax. Emphysematous changes are seen in the lungs. Focal infiltrate is seen in the posterior left upper lobe adjacent to the fissure. More significant infiltrate is seen in the right middle and lower lobes with air bronchograms. No nodules or masses are noted. Upper Abdomen: No acute abnormality. Musculoskeletal: No chest wall mass or suspicious bone lesions identified. IMPRESSION: 1. Significant infiltrate in the right middle and lower lobes worrisome for pneumonia. More mild infiltrate is seen in the posterior left upper lobe. Recommend short-term follow-up imaging to ensure resolution. 2. Atherosclerotic change in the thoracic aorta. Coronary artery calcifications. Emphysema. 3. Emphysema. Aortic Atherosclerosis (ICD10-I70.0) and Emphysema (ICD10-J43.9). Electronically Signed   By: Dorise Bullion III M.D   On: 09/13/2018 15:57   Nm Myocar Multi W/spect Tamela Oddi Motion / Ef  Result Date: 09/23/2018  There was no ST segment deviation noted during stress.  Defect 1: There is a medium defect of mild severity.  Findings consistent with prior myocardial infarction with peri-infarct ischemia.  This is a low risk study.  Nuclear stress EF: 53%.  The left ventricular ejection fraction is mildly decreased (45-54%).  Low risk stress nuclear study with a mild, mostly fixed moderate-size inferior wall defect (suspect nontransmural infarction with minimal peri-infarct ischemia in the right coronary distribution). Low normal left ventricular global systolic function.  Dg Chest Port 1 View  Result Date: 09/20/2018 CLINICAL DATA:  Pneumonia.  History of COPD. EXAM: PORTABLE CHEST 1 VIEW COMPARISON:  Single-view of the chest 09/18/2018 and 09/13/2018. CT chest 09/13/2018. FINDINGS: The lungs are emphysematous. Right basilar airspace disease persists but has improved since the most recent comparison. Mild opacity in left lung base noted. No pneumothorax. Small right pleural effusion. Heart size is normal. Aortic atherosclerosis. IMPRESSION: Improved right lower lobe pneumonia with associated small right pleural effusion. Minimal left basilar opacity also noted. Emphysema. Electronically Signed   By: Inge Rise M.D.   On: 09/20/2018 08:14   Dg Chest Port 1 View  Result Date: 09/18/2018 CLINICAL DATA:  Hypoxia. History of COPD. EXAM: PORTABLE CHEST 1 VIEW COMPARISON:  Chest radiograph and CT 09/13/2018 FINDINGS: The cardiomediastinal silhouette is unchanged. Airspace opacity in the right mid and lower lung is stable to slightly increased from the prior radiograph. There is a small right pleural effusion which was also shown on CT. Mild left basilar opacity has increased from the prior radiograph. No pneumothorax is identified. IMPRESSION: Slight worsening of right greater than left lung opacities  consistent with pneumonia. Small right pleural effusion. Electronically Signed   By: Logan Bores M.D.   On: 09/18/2018 09:12   Dg Chest Port 1 View  Result Date: 09/13/2018 CLINICAL DATA:  Hypoxia. EXAM: PORTABLE CHEST 1 VIEW COMPARISON:  Chest x-ray dated September 10, 2018. FINDINGS: Stable cardiomediastinal silhouette. Atherosclerotic calcification of the aortic  arch. Increasing patchy interstitial and airspace opacity in the right mid to lower lung. Increased mild left basilar atelectasis. No pleural effusion or pneumothorax. No acute osseous abnormality. IMPRESSION: 1. Slightly worsened right lower lobe pneumonia. Electronically Signed   By: Titus Dubin M.D.   On: 09/13/2018 10:54   Dg Chest Port 1 View  Result Date: 09/10/2018 CLINICAL DATA:  68 year old male with cough and fever EXAM: PORTABLE CHEST 1 VIEW COMPARISON:  February 13, 2018 FINDINGS: Cardiomediastinal silhouette unchanged in size and contour. Interval development of airspace opacity at the right lung base, partially obscuring the right hemidiaphragm and the right heart border. No pneumothorax. No large pleural effusion. IMPRESSION: Right lobar pneumonia Electronically Signed   By: Corrie Mckusick D.O.   On: 09/10/2018 09:32   Dg Swallowing Func-speech Pathology  Result Date: 09/19/2018 Objective Swallowing Evaluation: Type of Study: MBS-Modified Barium Swallow Study  Patient Details Name: DIANTE SAXON MRN: [id number] Date of Birth: 10/15/1950 Today's Date: 09/19/2018 Time: SLP Start Time (ACUTE ONLY): 1215 -SLP Stop Time (ACUTE ONLY): [id number] SLP Time Calculation (min) (ACUTE ONLY): 30 min Past Medical History: Past Medical History: Diagnosis Date  Anxiety   Arthritis   back, right hip  Chronic kidney disease   stage 3  COPD (chronic obstructive pulmonary disease) (HCC)   on 3L home O2  Diabetes mellitus without complication (HCC)   Dyspnea   Heart murmur   Hyperlipidemia   Hypertension   Neuropathy   Peripheral vascular disease  ([place])   Carotid artery stenosis, s/p bilat stents  Skin cancer   basal cell right cheek, left ear  Stroke ([place])   noted on MRI Past Surgical History: Past Surgical History: Procedure Laterality Date  ENDARTERECTOMY Left 02/21/2017  Procedure: LEFT CAROTID ENDARTERECTOMY;  Surgeon: Rosetta Posner, MD;  Location: [facility] OR;  Service: Vascular;  Laterality: Left;  lower extremity stenting    PATCH ANGIOPLASTY Left 02/21/2017  Procedure: PATCH ANGIOPLASTY OF LEFT CAROTID ARTERY USING [place];  Surgeon: Rosetta Posner, MD;  Location: [facility];  Service: Vascular;  Laterality: Left; HPI: RINO TWILLEY is a 68 y.o. male admitted 09/10/2018 with fever, SOB, and productive cough. PMH: CVA, carotid stenosis s/p B stents; HTN; HLD; DM; COPD on 3L home O2; stage 3 CKD, chronic back pain; No prior SLP intervention found. CXR = RLL PNA.  Subjective: alert and cooperative Assessment / Plan / Recommendation CHL IP CLINICAL IMPRESSIONS 09/19/2018 Clinical Impression MBS completed, pt on 3L [place] and respiratory status is stable at this time. His voice is clear and cough is strong. Pt had good bolus control, tongue elevation and complete mastication with soft solids.  His swallow initiation was timely with complete laryngeal elevation, epiglottic inversion and laryngeal closure. A cervical osteophyte at C5-C6 noted to slightly impeed clearing of large liquid boluses. Esophageal sweep with barium tablet revealed complete clearing of esophagus. Recommend Reg diet with thin liquids. Medication whole with liquids.  SLP Visit Diagnosis Dysphagia, unspecified (R13.10) Attention and concentration deficit following -- Frontal lobe and executive function deficit following -- Impact on safety and function Mild aspiration risk   CHL IP TREATMENT RECOMMENDATION 09/19/2018 Treatment Recommendations Therapy as outlined in treatment plan below   Prognosis 09/19/2018 Prognosis for Safe Diet Advancement Good Barriers to Reach Goals --  Barriers/Prognosis Comment -- CHL IP DIET RECOMMENDATION 09/19/2018 SLP Diet Recommendations Regular solids;Thin liquid Liquid Administration via Cup;Straw Medication Administration Whole meds with liquid Compensations Slow rate;Small sips/bites;Other (Comment) Postural Changes Seated upright  at 90 degrees   CHL IP OTHER RECOMMENDATIONS 09/19/2018 Recommended Consults -- Oral Care Recommendations Oral care BID Other Recommendations --   CHL IP FOLLOW UP RECOMMENDATIONS 09/19/2018 Follow up Recommendations None   CHL IP FREQUENCY AND DURATION 09/19/2018 Speech Therapy Frequency (ACUTE ONLY) min 2x/week Treatment Duration 1 week      CHL IP ORAL PHASE 09/19/2018 Oral Phase WFL Oral - Pudding Teaspoon -- Oral - Pudding Cup -- Oral - Honey Teaspoon -- Oral - Honey Cup -- Oral - Nectar Teaspoon -- Oral - Nectar Cup -- Oral - Nectar Straw -- Oral - Thin Teaspoon -- Oral - Thin Cup -- Oral - Thin Straw -- Oral - Puree -- Oral - Mech Soft -- Oral - Regular -- Oral - Multi-Consistency -- Oral - Pill -- Oral Phase - Comment --  CHL IP PHARYNGEAL PHASE 09/19/2018 Pharyngeal Phase WFL Pharyngeal- Pudding Teaspoon -- Pharyngeal -- Pharyngeal- Pudding Cup -- Pharyngeal -- Pharyngeal- Honey Teaspoon -- Pharyngeal -- Pharyngeal- Honey Cup -- Pharyngeal -- Pharyngeal- Nectar Teaspoon -- Pharyngeal -- Pharyngeal- Nectar Cup -- Pharyngeal -- Pharyngeal- Nectar Straw -- Pharyngeal -- Pharyngeal- Thin Teaspoon -- Pharyngeal -- Pharyngeal- Thin Cup -- Pharyngeal -- Pharyngeal- Thin Straw -- Pharyngeal -- Pharyngeal- Puree -- Pharyngeal -- Pharyngeal- Mechanical Soft -- Pharyngeal -- Pharyngeal- Regular -- Pharyngeal -- Pharyngeal- Multi-consistency -- Pharyngeal -- Pharyngeal- Pill -- Pharyngeal -- Pharyngeal Comment --  CHL IP CERVICAL ESOPHAGEAL PHASE 09/19/2018 Cervical Esophageal Phase WFL Pudding Teaspoon -- Pudding Cup -- Honey Teaspoon -- Honey Cup -- Nectar Teaspoon -- Nectar Cup -- Nectar Straw -- Thin Teaspoon -- Thin Cup -- Thin Straw --  Puree -- Mechanical Soft -- Regular -- Multi-consistency -- Pill -- Cervical Esophageal Comment -- Charlynne Cousins Ward, MA, CCC-SLP 09/19/2018 1:35 PM              Vas [place] Lower Extremity Venous (dvt)  Result Date: 09/13/2018  Lower Venous Study Indications: Edema, and Elevated D-Dimer.  Comparison Study: Prior study from 02/14/18 is available for comparison. Performing Technologist: Sharion Dove RVS  Examination Guidelines: A complete evaluation includes B-mode imaging, spectral Doppler, color Doppler, and power Doppler as needed of all accessible portions of each vessel. Bilateral testing is considered an integral part of a complete examination. Limited examinations for reoccurring indications may be performed as noted.  +---------+---------------+---------+-----------+----------+--------------+  RIGHT     Compressibility Phasicity Spontaneity Properties Thrombus Aging  +---------+---------------+---------+-----------+----------+--------------+  CFV       Full                                             Pulsatile       +---------+---------------+---------+-----------+----------+--------------+  SFJ       Full                                                             +---------+---------------+---------+-----------+----------+--------------+  FV Prox   Full                                                             +---------+---------------+---------+-----------+----------+--------------+  FV Mid    Full                                                             +---------+---------------+---------+-----------+----------+--------------+  FV Distal Full                                                             +---------+---------------+---------+-----------+----------+--------------+  PFV       Full                                                             +---------+---------------+---------+-----------+----------+--------------+  POP       Full                                             Pulsatile        +---------+---------------+---------+-----------+----------+--------------+  PTV       Full                                                             +---------+---------------+---------+-----------+----------+--------------+  PERO      Full                                                             +---------+---------------+---------+-----------+----------+--------------+   +---------+---------------+---------+-----------+----------+--------------+  LEFT      Compressibility Phasicity Spontaneity Properties Thrombus Aging  +---------+---------------+---------+-----------+----------+--------------+  CFV       Full                                             Pulsatile       +---------+---------------+---------+-----------+----------+--------------+  SFJ       Full                                                             +---------+---------------+---------+-----------+----------+--------------+  FV Prox   Full                                                             +---------+---------------+---------+-----------+----------+--------------+  FV Mid    Full                                                             +---------+---------------+---------+-----------+----------+--------------+  FV Distal Full                                                             +---------+---------------+---------+-----------+----------+--------------+  PFV       Full                                                             +---------+---------------+---------+-----------+----------+--------------+  POP       Full                                             Pulsatile       +---------+---------------+---------+-----------+----------+--------------+  PTV       Full                                                             +---------+---------------+---------+-----------+----------+--------------+  PERO      Full                                                              +---------+---------------+---------+-----------+----------+--------------+     Summary: Right: Findings appear essentially unchanged compared to previous examination. There is no evidence of deep vein thrombosis in the lower extremity. Left: Findings appear essentially unchanged compared to previous examination. There is no evidence of deep vein thrombosis in the lower extremity.  *See table(s) above for measurements and observations. Electronically signed by Deitra Mayo MD on 09/13/2018 at 7:22:57 PM.    Final     Microbiology: Recent Results (from the past 240 hour(s))  Expectorated sputum assessment w rflx to resp cult     Status: None   Collection Time: 09/19/18 10:14 AM   Specimen: Sputum  Result Value Ref Range Status   Specimen Description SPUTUM  Final   Special Requests NONE  Final   Sputum evaluation   Final    THIS SPECIMEN IS ACCEPTABLE FOR SPUTUM CULTURE Performed at [facility], [address] [place], [place] [address]    Report Status 09/19/2018 FINAL  Final  Culture, respiratory     Status: None   Collection Time: 09/19/18 10:14 AM   Specimen: SPU  Result Value Ref Range Status   Specimen Description  SPUTUM  Final   Special Requests NONE Reflexed from [id number]  Final   Gram Stain   Final    FEW WBC PRESENT, PREDOMINANTLY PMN RARE GRAM POSITIVE COCCI RARE YEAST    Culture   Final    Consistent with normal respiratory flora. Performed at [facility], [place] [address] [place], [place] [address]    Report Status 09/21/2018 FINAL  Final  Culture, blood (routine x 2)     Status: None (Preliminary result)   Collection Time: 09/19/18 10:34 AM   Specimen: BLOOD LEFT ARM  Result Value Ref Range Status   Specimen Description BLOOD LEFT ARM  Final   Special Requests   Final    BOTTLES DRAWN AEROBIC ONLY Blood Culture results may not be optimal due to an inadequate volume of blood received in culture bottles   Culture   Final    NO GROWTH 4  DAYS Performed at [facility], [place] [address], [place], [place] [address]    Report Status PENDING  Incomplete  Culture, blood (routine x 2)     Status: None (Preliminary result)   Collection Time: 09/19/18 10:39 AM   Specimen: BLOOD LEFT HAND  Result Value Ref Range Status   Specimen Description BLOOD LEFT HAND  Final   Special Requests   Final    BOTTLES DRAWN AEROBIC ONLY Blood Culture results may not be optimal due to an inadequate volume of blood received in culture bottles   Culture   Final    NO GROWTH 4 DAYS Performed at [facility], [place] [address] [place], [place] [address]    Report Status PENDING  Incomplete     Labs: Basic Metabolic Panel: Recent Labs  Lab 09/22/18 0708 09/22/18 1659 09/23/18 0349 09/24/18 0521 09/25/18 0113  NA 133*  --  138 135 132*  K 4.5  --  4.1 4.1 4.7  CL 100  --  101 101 98  CO2 24  --  28 26 25   GLUCOSE 145*  --  89 88 174*  BUN 10  --  10 13 17   CREATININE 1.15  --  1.06 1.14 1.19  CALCIUM 8.5*  --  9.0 8.6* 8.8*  MG  --  1.7  --   --   --    Liver Function Tests: No results for input(s): AST, ALT, ALKPHOS, BILITOT, PROT, ALBUMIN in the last 168 hours. No results for input(s): LIPASE, AMYLASE in the last 168 hours. No results for input(s): AMMONIA in the last 168 hours. CBC: Recent Labs  Lab 09/22/18 0116 09/22/18 0708 09/23/18 0349 09/24/18 0521 09/25/18 0113  WBC  --  7.8 7.9 7.3 7.3  HGB 9.6* 10.2* 10.6* 10.4* 9.6*  HCT 27.7* 28.4* 31.3* 31.7* 27.9*  MCV  --  98.6 97.5 100.3* 99.3  PLT  --  258 261 235 201   Cardiac Enzymes: No results for input(s): CKTOTAL, CKMB, CKMBINDEX, TROPONINI in the last 168 hours. BNP: BNP (last 3 results) Recent Labs    02/13/18 1020 09/13/18 1522  BNP 358.9* 2,329.7*    ProBNP (last 3 results) No results for input(s): PROBNP in the last 8760 hours.  CBG: Recent Labs  Lab 09/27/18 0616 09/27/18 1104 09/27/18 1633 09/27/18 2109 09/28/18 0708  GLUCAP 149*  313* 180* 293* 151*    Principal Problem:   Severe sepsis (HCC) Active Problems:   Acute respiratory failure with hypoxia (HCC)   COPD (chronic obstructive pulmonary disease) ([place])  Type 2 diabetes mellitus with other specified complication (HCC)   CKD (chronic kidney disease) stage 3, GFR 30-59 ml/min (HCC)   RLL pneumonia (HCC)   Essential hypertension   Acute on chronic diastolic CHF (congestive heart failure) ([place])   Hyperbilirubinemia   Septic shock ([place])   Time coordinating discharge: 38 minutes.  Signed:        Armentha Branagan, DO [facility]  09/28/2018, 10:07 AM

## 2018-09-28 NOTE — TOC Transition Note (Addendum)
Transition of Care [facility]) - CM/SW Discharge Note   Patient Details  Name: ZAIYDEN BRUMBELOW MRN: [id number] Date of Birth: 03-05-1950  Transition of Care [facility]) CM/SW Contact:  Alberteen Sam, [place] Phone Number: [phone] 09/28/2018, 1:36 PM   Clinical Narrative:     Patient will DC to: [facility] Anticipated DC date: 09/28/2018 Family notified: Mae Transport by: Corey Harold  Per MD patient ready for DC to [place]. RN, patient, patient's family, and facility notified of DC. Discharge Summary sent to facility. RN given number for report [phone] Room 112 . DC packet on chart. Ambulance transport requested for patient.  CSW signing off.  [facility], [place]   Final next level of care: Skilled Nursing Facility Barriers to Discharge: No Barriers Identified   Patient Goals and CMS Choice Patient states their goals for this hospitalization and ongoing recovery are:: to go to SNF then home CMS Medicare.gov Compare Post Acute Care list provided to:: Patient Choice offered to / list presented to : Patient  Discharge Placement PASRR number recieved: 09/17/18            Patient chooses bed at: Other - please specify in the comment section below:([facility]) Patient to be transferred to facility by: [place] Name of family member notified: Mae Patient and family notified of of transfer: 09/28/18  Discharge Plan and Services     Post Acute Care Choice: [place]: RN   Date [place]: 09/11/18 Time [place]: 51 Representative spoke with at [place]: [place] ([employer]) Interventions     Readmission Risk Interventions Readmission Risk Prevention Plan 09/11/2018  Transportation Screening Complete  HRI or Home Care Consult Complete  Some recent data might be hidden

## 2018-09-28 NOTE — Progress Notes (Signed)
Physical Therapy Treatment Patient Details Name: Adrian Andrews MRN: [id number] DOB: [birth year] Today's Date: 09/28/2018    History of Present Illness BEKIM VENNE is a 68 y.o. male with medical history significant of CVA; carotid stenosis s/p B stents; HTN; HLD; DM; COPD on 2L home O2; and stage 3 CKD presenting with fever, cough, and diarrhea. Found to have Sepsis due to pneumonia. Transferred to ICU 9/4 with respiratory distress, anemia and hypotension with Vtach 9/7    PT Comments    Pt very pleasant with much better appearance than last visit with this therapist. Pt with significantly improved gait and activity tolerance this session able to walk 300' with RW on 6L. SpO2 91% at rest on 3L with HR 113 and 86% on 6L with gait with HR 120. Pt educated for activity progression and HEP with pt stating increased in room mobility throughout the day as well. D/C plan remains appropriate as pt does not yet have the activity tolerance to return home alone.    Follow Up Recommendations  SNF;Supervision/Assistance - 24 hour     Equipment Recommendations  Rolling walker with 5" wheels    Recommendations for Other Services       Precautions / Restrictions Precautions Precautions: Fall Precaution Comments: watch O2    Mobility  Bed Mobility               General bed mobility comments: pt sitting EOB on arrival and end of session per request  Transfers Overall transfer level: Modified independent               General transfer comment: pt able to sit and stand from EOB without assist  Ambulation/Gait Ambulation/Gait assistance: Supervision Gait Distance (Feet): 300 Feet Assistive device: Rolling walker (2 wheeled) Gait Pattern/deviations: Step-through pattern;Decreased stride length;Trunk flexed   Gait velocity interpretation: 1.31 - 2.62 ft/sec, indicative of limited community ambulator General Gait Details: cues for proximity to RW, breathing technique with pt able  to self regulate activity. Pt on 6L for gait with SpO2 registering 86% but unclear if accurate as it did not match telemetry.   Stairs             Wheelchair Mobility    Modified Rankin (Stroke Patients Only)       Balance Overall balance assessment: Needs assistance Sitting-balance support: No upper extremity supported;Feet supported Sitting balance-Leahy Scale: Good     Standing balance support: Bilateral upper extremity supported;During functional activity Standing balance-Leahy Scale: Fair Standing balance comment: able to stand without AD, RW for gait                            Cognition Arousal/Alertness: Awake/alert Behavior During Therapy: WFL for tasks assessed/performed Overall Cognitive Status: Within Functional Limits for tasks assessed                                        Exercises General Exercises - Lower Extremity Long Arc Quad: AROM;Both;Seated;15 reps Hip Flexion/Marching: Both;Strengthening;AROM;Seated;15 reps Toe Raises: AROM;20 reps;Seated;Both Heel Raises: AROM;20 reps;Both;Seated    General Comments        Pertinent Vitals/Pain Pain Assessment: 0-10 Pain Score: 3  Pain Location: left ear Pain Descriptors / Indicators: Sore Pain Intervention(s): Monitored during session    Home Living  Prior Function            PT Goals (current goals can now be found in the care plan section) Progress towards PT goals: Progressing toward goals    Frequency    Min 2X/week      PT Plan Current plan remains appropriate    Co-evaluation              AM-PAC PT "6 Clicks" Mobility   Outcome Measure  Help needed turning from your back to your side while in a flat bed without using bedrails?: None Help needed moving from lying on your back to sitting on the side of a flat bed without using bedrails?: A Little Help needed moving to and from a bed to a chair (including a  wheelchair)?: None Help needed standing up from a chair using your arms (e.g., wheelchair or bedside chair)?: None Help needed to walk in hospital room?: A Little Help needed climbing 3-5 steps with a railing? : A Little 6 Click Score: 21    End of Session Equipment Utilized During Treatment: Oxygen Activity Tolerance: Patient tolerated treatment well Patient left: in bed;with call bell/phone within reach Nurse Communication: Mobility status PT Visit Diagnosis: Other abnormalities of gait and mobility (R26.89);Muscle weakness (generalized) (M62.81)     Time: [id number] PT Time Calculation (min) (ACUTE ONLY): 19 min  Charges:  $Gait Training: 8-22 mins                     Makayle Krahn Pam Drown, PT Acute Rehabilitation Services Pager: [phone] Office: [phone]    Sandy Salaam Yohan Samons 09/28/2018, 1:33 PM

## 2018-09-29 LAB — ACID FAST SMEAR (AFB, MYCOBACTERIA): Acid Fast Smear: NEGATIVE

## 2018-09-30 DIAGNOSIS — I110 Hypertensive heart disease with heart failure: Secondary | ICD-10-CM | POA: Diagnosis not present

## 2018-09-30 DIAGNOSIS — F329 Major depressive disorder, single episode, unspecified: Secondary | ICD-10-CM | POA: Diagnosis not present

## 2018-09-30 DIAGNOSIS — J90 Pleural effusion, not elsewhere classified: Secondary | ICD-10-CM | POA: Diagnosis not present

## 2018-09-30 DIAGNOSIS — F411 Generalized anxiety disorder: Secondary | ICD-10-CM | POA: Diagnosis not present

## 2018-10-02 DIAGNOSIS — E1122 Type 2 diabetes mellitus with diabetic chronic kidney disease: Secondary | ICD-10-CM | POA: Diagnosis not present

## 2018-10-02 DIAGNOSIS — M545 Low back pain: Secondary | ICD-10-CM | POA: Diagnosis not present

## 2018-10-09 DIAGNOSIS — M545 Low back pain: Secondary | ICD-10-CM | POA: Diagnosis not present

## 2018-10-09 DIAGNOSIS — E1122 Type 2 diabetes mellitus with diabetic chronic kidney disease: Secondary | ICD-10-CM | POA: Diagnosis not present

## 2018-10-12 DIAGNOSIS — J189 Pneumonia, unspecified organism: Secondary | ICD-10-CM | POA: Diagnosis not present

## 2018-10-12 DIAGNOSIS — F411 Generalized anxiety disorder: Secondary | ICD-10-CM | POA: Diagnosis not present

## 2018-10-12 DIAGNOSIS — I110 Hypertensive heart disease with heart failure: Secondary | ICD-10-CM | POA: Diagnosis not present

## 2018-10-12 DIAGNOSIS — F329 Major depressive disorder, single episode, unspecified: Secondary | ICD-10-CM | POA: Diagnosis not present

## 2018-10-16 DIAGNOSIS — M545 Low back pain: Secondary | ICD-10-CM | POA: Diagnosis not present

## 2018-10-16 DIAGNOSIS — I472 Ventricular tachycardia: Secondary | ICD-10-CM | POA: Diagnosis not present

## 2018-10-16 DIAGNOSIS — N1831 Chronic kidney disease, stage 3a: Secondary | ICD-10-CM | POA: Diagnosis not present

## 2018-10-16 DIAGNOSIS — F411 Generalized anxiety disorder: Secondary | ICD-10-CM | POA: Diagnosis not present

## 2018-10-16 DIAGNOSIS — E1122 Type 2 diabetes mellitus with diabetic chronic kidney disease: Secondary | ICD-10-CM | POA: Diagnosis not present

## 2018-10-16 DIAGNOSIS — I110 Hypertensive heart disease with heart failure: Secondary | ICD-10-CM | POA: Diagnosis not present

## 2018-10-16 DIAGNOSIS — Z7984 Long term (current) use of oral hypoglycemic drugs: Secondary | ICD-10-CM | POA: Diagnosis not present

## 2018-10-16 DIAGNOSIS — Z955 Presence of coronary angioplasty implant and graft: Secondary | ICD-10-CM | POA: Diagnosis not present

## 2018-10-16 DIAGNOSIS — I5033 Acute on chronic diastolic (congestive) heart failure: Secondary | ICD-10-CM | POA: Diagnosis not present

## 2018-10-16 DIAGNOSIS — I2510 Atherosclerotic heart disease of native coronary artery without angina pectoris: Secondary | ICD-10-CM | POA: Diagnosis not present

## 2018-10-16 DIAGNOSIS — J9621 Acute and chronic respiratory failure with hypoxia: Secondary | ICD-10-CM | POA: Diagnosis not present

## 2018-10-16 DIAGNOSIS — D500 Iron deficiency anemia secondary to blood loss (chronic): Secondary | ICD-10-CM | POA: Diagnosis not present

## 2018-10-16 DIAGNOSIS — F329 Major depressive disorder, single episode, unspecified: Secondary | ICD-10-CM | POA: Diagnosis not present

## 2018-10-16 DIAGNOSIS — Z9981 Dependence on supplemental oxygen: Secondary | ICD-10-CM | POA: Diagnosis not present

## 2018-10-16 DIAGNOSIS — J189 Pneumonia, unspecified organism: Secondary | ICD-10-CM | POA: Diagnosis not present

## 2018-10-16 DIAGNOSIS — I130 Hypertensive heart and chronic kidney disease with heart failure and stage 1 through stage 4 chronic kidney disease, or unspecified chronic kidney disease: Secondary | ICD-10-CM | POA: Diagnosis not present

## 2018-10-20 NOTE — Progress Notes (Signed)
This patient's AFB culture came back positive at 3 weeks. Please contact patient and arrange follow up.  Contains abnormal data Acid Fast Culture with reflexed sensitivities Order: [id number] Status:  Edited Result - FINAL Visible to patient:  Yes (MyChart) Specimen Information: Sputum   Component 74mo ago Acid Fast Culture PositiveAbnormal   Comment: (NOTE)  Acid-fast bacilli have been detected in culture at 3 weeks; see AFB  Organism ID by DNA probe  Performed At: [facility]  [place], [place] [id number]  Rush Farmer MD [id number]  Source of Sample SPUTUM  Comment: Performed at [facility], [place] [address] [place], [place] [address] Resulting Agency [facility]   Specimen Collected: 09/19/18 10:14 Last Resulted: 10/19/18 06:35

## 2018-10-21 DIAGNOSIS — I2510 Atherosclerotic heart disease of native coronary artery without angina pectoris: Secondary | ICD-10-CM | POA: Diagnosis not present

## 2018-10-21 DIAGNOSIS — E1122 Type 2 diabetes mellitus with diabetic chronic kidney disease: Secondary | ICD-10-CM | POA: Diagnosis not present

## 2018-10-21 DIAGNOSIS — J189 Pneumonia, unspecified organism: Secondary | ICD-10-CM | POA: Diagnosis not present

## 2018-10-21 DIAGNOSIS — I130 Hypertensive heart and chronic kidney disease with heart failure and stage 1 through stage 4 chronic kidney disease, or unspecified chronic kidney disease: Secondary | ICD-10-CM | POA: Diagnosis not present

## 2018-10-21 DIAGNOSIS — I5033 Acute on chronic diastolic (congestive) heart failure: Secondary | ICD-10-CM | POA: Diagnosis not present

## 2018-10-21 DIAGNOSIS — J9621 Acute and chronic respiratory failure with hypoxia: Secondary | ICD-10-CM | POA: Diagnosis not present

## 2018-10-22 DIAGNOSIS — E1122 Type 2 diabetes mellitus with diabetic chronic kidney disease: Secondary | ICD-10-CM | POA: Diagnosis not present

## 2018-10-22 DIAGNOSIS — I5033 Acute on chronic diastolic (congestive) heart failure: Secondary | ICD-10-CM | POA: Diagnosis not present

## 2018-10-22 DIAGNOSIS — I2510 Atherosclerotic heart disease of native coronary artery without angina pectoris: Secondary | ICD-10-CM | POA: Diagnosis not present

## 2018-10-22 DIAGNOSIS — J189 Pneumonia, unspecified organism: Secondary | ICD-10-CM | POA: Diagnosis not present

## 2018-10-22 DIAGNOSIS — J9621 Acute and chronic respiratory failure with hypoxia: Secondary | ICD-10-CM | POA: Diagnosis not present

## 2018-10-22 DIAGNOSIS — I130 Hypertensive heart and chronic kidney disease with heart failure and stage 1 through stage 4 chronic kidney disease, or unspecified chronic kidney disease: Secondary | ICD-10-CM | POA: Diagnosis not present

## 2018-10-23 DIAGNOSIS — J189 Pneumonia, unspecified organism: Secondary | ICD-10-CM | POA: Diagnosis not present

## 2018-10-23 DIAGNOSIS — Z8679 Personal history of other diseases of the circulatory system: Secondary | ICD-10-CM | POA: Diagnosis not present

## 2018-10-23 DIAGNOSIS — R609 Edema, unspecified: Secondary | ICD-10-CM | POA: Diagnosis not present

## 2018-10-23 DIAGNOSIS — J449 Chronic obstructive pulmonary disease, unspecified: Secondary | ICD-10-CM | POA: Diagnosis not present

## 2018-10-23 DIAGNOSIS — E1140 Type 2 diabetes mellitus with diabetic neuropathy, unspecified: Secondary | ICD-10-CM | POA: Diagnosis not present

## 2018-10-23 DIAGNOSIS — J9611 Chronic respiratory failure with hypoxia: Secondary | ICD-10-CM | POA: Diagnosis not present

## 2018-10-24 DIAGNOSIS — E1122 Type 2 diabetes mellitus with diabetic chronic kidney disease: Secondary | ICD-10-CM | POA: Diagnosis not present

## 2018-10-24 DIAGNOSIS — J9621 Acute and chronic respiratory failure with hypoxia: Secondary | ICD-10-CM | POA: Diagnosis not present

## 2018-10-24 DIAGNOSIS — J189 Pneumonia, unspecified organism: Secondary | ICD-10-CM | POA: Diagnosis not present

## 2018-10-24 DIAGNOSIS — I2510 Atherosclerotic heart disease of native coronary artery without angina pectoris: Secondary | ICD-10-CM | POA: Diagnosis not present

## 2018-10-24 DIAGNOSIS — I5033 Acute on chronic diastolic (congestive) heart failure: Secondary | ICD-10-CM | POA: Diagnosis not present

## 2018-10-24 DIAGNOSIS — I130 Hypertensive heart and chronic kidney disease with heart failure and stage 1 through stage 4 chronic kidney disease, or unspecified chronic kidney disease: Secondary | ICD-10-CM | POA: Diagnosis not present

## 2018-10-27 DIAGNOSIS — I130 Hypertensive heart and chronic kidney disease with heart failure and stage 1 through stage 4 chronic kidney disease, or unspecified chronic kidney disease: Secondary | ICD-10-CM | POA: Diagnosis not present

## 2018-10-27 DIAGNOSIS — J189 Pneumonia, unspecified organism: Secondary | ICD-10-CM | POA: Diagnosis not present

## 2018-10-27 DIAGNOSIS — I5033 Acute on chronic diastolic (congestive) heart failure: Secondary | ICD-10-CM | POA: Diagnosis not present

## 2018-10-27 DIAGNOSIS — I2510 Atherosclerotic heart disease of native coronary artery without angina pectoris: Secondary | ICD-10-CM | POA: Diagnosis not present

## 2018-10-27 DIAGNOSIS — E1122 Type 2 diabetes mellitus with diabetic chronic kidney disease: Secondary | ICD-10-CM | POA: Diagnosis not present

## 2018-10-27 DIAGNOSIS — J9621 Acute and chronic respiratory failure with hypoxia: Secondary | ICD-10-CM | POA: Diagnosis not present

## 2018-10-28 DIAGNOSIS — I130 Hypertensive heart and chronic kidney disease with heart failure and stage 1 through stage 4 chronic kidney disease, or unspecified chronic kidney disease: Secondary | ICD-10-CM | POA: Diagnosis not present

## 2018-10-28 DIAGNOSIS — I2510 Atherosclerotic heart disease of native coronary artery without angina pectoris: Secondary | ICD-10-CM | POA: Diagnosis not present

## 2018-10-28 DIAGNOSIS — J189 Pneumonia, unspecified organism: Secondary | ICD-10-CM | POA: Diagnosis not present

## 2018-10-28 DIAGNOSIS — E1122 Type 2 diabetes mellitus with diabetic chronic kidney disease: Secondary | ICD-10-CM | POA: Diagnosis not present

## 2018-10-28 DIAGNOSIS — J9621 Acute and chronic respiratory failure with hypoxia: Secondary | ICD-10-CM | POA: Diagnosis not present

## 2018-10-28 DIAGNOSIS — I5033 Acute on chronic diastolic (congestive) heart failure: Secondary | ICD-10-CM | POA: Diagnosis not present

## 2018-11-02 DIAGNOSIS — J9621 Acute and chronic respiratory failure with hypoxia: Secondary | ICD-10-CM | POA: Diagnosis not present

## 2018-11-02 DIAGNOSIS — J189 Pneumonia, unspecified organism: Secondary | ICD-10-CM | POA: Diagnosis not present

## 2018-11-02 DIAGNOSIS — I5033 Acute on chronic diastolic (congestive) heart failure: Secondary | ICD-10-CM | POA: Diagnosis not present

## 2018-11-02 DIAGNOSIS — I2510 Atherosclerotic heart disease of native coronary artery without angina pectoris: Secondary | ICD-10-CM | POA: Diagnosis not present

## 2018-11-02 DIAGNOSIS — I130 Hypertensive heart and chronic kidney disease with heart failure and stage 1 through stage 4 chronic kidney disease, or unspecified chronic kidney disease: Secondary | ICD-10-CM | POA: Diagnosis not present

## 2018-11-02 DIAGNOSIS — E1122 Type 2 diabetes mellitus with diabetic chronic kidney disease: Secondary | ICD-10-CM | POA: Diagnosis not present

## 2018-11-02 LAB — AFB ORGANISM ID BY DNA PROBE
M avium complex: NEGATIVE
M tuberculosis complex: NEGATIVE

## 2018-11-02 LAB — RAPID GROWER BROTH SUSCEP.
Cefoxitin: 8
Tigecycline: 0.06

## 2018-11-02 LAB — ACID FAST CULTURE WITH REFLEXED SENSITIVITIES (MYCOBACTERIA): Acid Fast Culture: POSITIVE — AB

## 2018-11-02 LAB — ORG ID BY SEQUENCING RFLX AST

## 2018-11-04 DIAGNOSIS — I130 Hypertensive heart and chronic kidney disease with heart failure and stage 1 through stage 4 chronic kidney disease, or unspecified chronic kidney disease: Secondary | ICD-10-CM | POA: Diagnosis not present

## 2018-11-04 DIAGNOSIS — J9621 Acute and chronic respiratory failure with hypoxia: Secondary | ICD-10-CM | POA: Diagnosis not present

## 2018-11-04 DIAGNOSIS — I2510 Atherosclerotic heart disease of native coronary artery without angina pectoris: Secondary | ICD-10-CM | POA: Diagnosis not present

## 2018-11-04 DIAGNOSIS — E1122 Type 2 diabetes mellitus with diabetic chronic kidney disease: Secondary | ICD-10-CM | POA: Diagnosis not present

## 2018-11-04 DIAGNOSIS — J189 Pneumonia, unspecified organism: Secondary | ICD-10-CM | POA: Diagnosis not present

## 2018-11-04 DIAGNOSIS — I5033 Acute on chronic diastolic (congestive) heart failure: Secondary | ICD-10-CM | POA: Diagnosis not present

## 2018-11-06 DIAGNOSIS — Z20828 Contact with and (suspected) exposure to other viral communicable diseases: Secondary | ICD-10-CM | POA: Diagnosis not present

## 2018-11-06 DIAGNOSIS — Z111 Encounter for screening for respiratory tuberculosis: Secondary | ICD-10-CM | POA: Diagnosis not present

## 2018-11-06 DIAGNOSIS — Z23 Encounter for immunization: Secondary | ICD-10-CM | POA: Diagnosis not present

## 2018-11-13 DIAGNOSIS — Z20828 Contact with and (suspected) exposure to other viral communicable diseases: Secondary | ICD-10-CM | POA: Diagnosis not present

## 2018-11-16 DIAGNOSIS — E785 Hyperlipidemia, unspecified: Secondary | ICD-10-CM | POA: Diagnosis not present

## 2018-11-16 DIAGNOSIS — E119 Type 2 diabetes mellitus without complications: Secondary | ICD-10-CM | POA: Diagnosis not present

## 2018-11-16 DIAGNOSIS — G894 Chronic pain syndrome: Secondary | ICD-10-CM | POA: Diagnosis not present

## 2018-11-16 DIAGNOSIS — M545 Low back pain: Secondary | ICD-10-CM | POA: Diagnosis not present

## 2018-11-16 DIAGNOSIS — I10 Essential (primary) hypertension: Secondary | ICD-10-CM | POA: Diagnosis not present

## 2018-11-20 DIAGNOSIS — Z20828 Contact with and (suspected) exposure to other viral communicable diseases: Secondary | ICD-10-CM | POA: Diagnosis not present

## 2018-11-22 DIAGNOSIS — I6523 Occlusion and stenosis of bilateral carotid arteries: Secondary | ICD-10-CM | POA: Diagnosis not present

## 2018-11-22 DIAGNOSIS — Z7984 Long term (current) use of oral hypoglycemic drugs: Secondary | ICD-10-CM | POA: Diagnosis not present

## 2018-11-22 DIAGNOSIS — F411 Generalized anxiety disorder: Secondary | ICD-10-CM | POA: Diagnosis not present

## 2018-11-22 DIAGNOSIS — J9611 Chronic respiratory failure with hypoxia: Secondary | ICD-10-CM | POA: Diagnosis not present

## 2018-11-22 DIAGNOSIS — I10 Essential (primary) hypertension: Secondary | ICD-10-CM | POA: Diagnosis not present

## 2018-11-22 DIAGNOSIS — G458 Other transient cerebral ischemic attacks and related syndromes: Secondary | ICD-10-CM | POA: Diagnosis not present

## 2018-11-22 DIAGNOSIS — M1611 Unilateral primary osteoarthritis, right hip: Secondary | ICD-10-CM | POA: Diagnosis not present

## 2018-11-22 DIAGNOSIS — E785 Hyperlipidemia, unspecified: Secondary | ICD-10-CM | POA: Diagnosis not present

## 2018-11-22 DIAGNOSIS — F321 Major depressive disorder, single episode, moderate: Secondary | ICD-10-CM | POA: Diagnosis not present

## 2018-11-22 DIAGNOSIS — N183 Chronic kidney disease, stage 3 unspecified: Secondary | ICD-10-CM | POA: Diagnosis not present

## 2018-11-22 DIAGNOSIS — I70203 Unspecified atherosclerosis of native arteries of extremities, bilateral legs: Secondary | ICD-10-CM | POA: Diagnosis not present

## 2018-11-22 DIAGNOSIS — J449 Chronic obstructive pulmonary disease, unspecified: Secondary | ICD-10-CM | POA: Diagnosis not present

## 2018-11-22 DIAGNOSIS — D751 Secondary polycythemia: Secondary | ICD-10-CM | POA: Diagnosis not present

## 2018-11-22 DIAGNOSIS — E1151 Type 2 diabetes mellitus with diabetic peripheral angiopathy without gangrene: Secondary | ICD-10-CM | POA: Diagnosis not present

## 2018-11-22 DIAGNOSIS — M6281 Muscle weakness (generalized): Secondary | ICD-10-CM | POA: Diagnosis not present

## 2018-11-22 DIAGNOSIS — E1140 Type 2 diabetes mellitus with diabetic neuropathy, unspecified: Secondary | ICD-10-CM | POA: Diagnosis not present

## 2018-11-22 DIAGNOSIS — F4321 Adjustment disorder with depressed mood: Secondary | ICD-10-CM | POA: Diagnosis not present

## 2018-11-22 DIAGNOSIS — D72829 Elevated white blood cell count, unspecified: Secondary | ICD-10-CM | POA: Diagnosis not present

## 2018-11-22 DIAGNOSIS — E1122 Type 2 diabetes mellitus with diabetic chronic kidney disease: Secondary | ICD-10-CM | POA: Diagnosis not present

## 2018-11-26 DIAGNOSIS — Z20828 Contact with and (suspected) exposure to other viral communicable diseases: Secondary | ICD-10-CM | POA: Diagnosis not present

## 2018-11-30 DIAGNOSIS — B372 Candidiasis of skin and nail: Secondary | ICD-10-CM | POA: Diagnosis not present

## 2018-11-30 DIAGNOSIS — E785 Hyperlipidemia, unspecified: Secondary | ICD-10-CM | POA: Diagnosis not present

## 2018-11-30 DIAGNOSIS — E119 Type 2 diabetes mellitus without complications: Secondary | ICD-10-CM | POA: Diagnosis not present

## 2018-12-02 ENCOUNTER — Telehealth: Payer: Self-pay | Admitting: Pulmonary Disease

## 2018-12-02 DIAGNOSIS — Z20828 Contact with and (suspected) exposure to other viral communicable diseases: Secondary | ICD-10-CM | POA: Diagnosis not present

## 2018-12-02 NOTE — Telephone Encounter (Signed)
Called Adrian Andrews back at the number provided, was transferred to two different departments before I was transferred to [facility] phone. Left a detailed message for her to call us back since we are not getting samples of Bevespi anymore.

## 2018-12-03 NOTE — Telephone Encounter (Signed)
Spoke with someone at [facility], she would not give me her name. Advised her that we do not get samples of Bevespi any more. Nothing further was needed.

## 2018-12-07 DIAGNOSIS — M545 Low back pain: Secondary | ICD-10-CM | POA: Diagnosis not present

## 2018-12-07 DIAGNOSIS — Z9981 Dependence on supplemental oxygen: Secondary | ICD-10-CM | POA: Diagnosis not present

## 2018-12-07 DIAGNOSIS — E119 Type 2 diabetes mellitus without complications: Secondary | ICD-10-CM | POA: Diagnosis not present

## 2018-12-07 DIAGNOSIS — J449 Chronic obstructive pulmonary disease, unspecified: Secondary | ICD-10-CM | POA: Diagnosis not present

## 2018-12-07 DIAGNOSIS — L989 Disorder of the skin and subcutaneous tissue, unspecified: Secondary | ICD-10-CM | POA: Diagnosis not present

## 2018-12-15 DIAGNOSIS — Z20828 Contact with and (suspected) exposure to other viral communicable diseases: Secondary | ICD-10-CM | POA: Diagnosis not present

## 2018-12-21 DIAGNOSIS — Z20828 Contact with and (suspected) exposure to other viral communicable diseases: Secondary | ICD-10-CM | POA: Diagnosis not present

## 2018-12-22 DIAGNOSIS — E1140 Type 2 diabetes mellitus with diabetic neuropathy, unspecified: Secondary | ICD-10-CM | POA: Diagnosis not present

## 2018-12-22 DIAGNOSIS — F321 Major depressive disorder, single episode, moderate: Secondary | ICD-10-CM | POA: Diagnosis not present

## 2018-12-22 DIAGNOSIS — J9611 Chronic respiratory failure with hypoxia: Secondary | ICD-10-CM | POA: Diagnosis not present

## 2018-12-22 DIAGNOSIS — D72829 Elevated white blood cell count, unspecified: Secondary | ICD-10-CM | POA: Diagnosis not present

## 2018-12-22 DIAGNOSIS — E785 Hyperlipidemia, unspecified: Secondary | ICD-10-CM | POA: Diagnosis not present

## 2018-12-22 DIAGNOSIS — E1151 Type 2 diabetes mellitus with diabetic peripheral angiopathy without gangrene: Secondary | ICD-10-CM | POA: Diagnosis not present

## 2018-12-22 DIAGNOSIS — F411 Generalized anxiety disorder: Secondary | ICD-10-CM | POA: Diagnosis not present

## 2018-12-22 DIAGNOSIS — D751 Secondary polycythemia: Secondary | ICD-10-CM | POA: Diagnosis not present

## 2018-12-22 DIAGNOSIS — N183 Chronic kidney disease, stage 3 unspecified: Secondary | ICD-10-CM | POA: Diagnosis not present

## 2018-12-22 DIAGNOSIS — J449 Chronic obstructive pulmonary disease, unspecified: Secondary | ICD-10-CM | POA: Diagnosis not present

## 2018-12-22 DIAGNOSIS — I10 Essential (primary) hypertension: Secondary | ICD-10-CM | POA: Diagnosis not present

## 2018-12-22 DIAGNOSIS — M1611 Unilateral primary osteoarthritis, right hip: Secondary | ICD-10-CM | POA: Diagnosis not present

## 2018-12-22 DIAGNOSIS — F4321 Adjustment disorder with depressed mood: Secondary | ICD-10-CM | POA: Diagnosis not present

## 2018-12-22 DIAGNOSIS — Z7984 Long term (current) use of oral hypoglycemic drugs: Secondary | ICD-10-CM | POA: Diagnosis not present

## 2018-12-22 DIAGNOSIS — G458 Other transient cerebral ischemic attacks and related syndromes: Secondary | ICD-10-CM | POA: Diagnosis not present

## 2018-12-22 DIAGNOSIS — E1122 Type 2 diabetes mellitus with diabetic chronic kidney disease: Secondary | ICD-10-CM | POA: Diagnosis not present

## 2018-12-22 DIAGNOSIS — I6523 Occlusion and stenosis of bilateral carotid arteries: Secondary | ICD-10-CM | POA: Diagnosis not present

## 2018-12-22 DIAGNOSIS — M6281 Muscle weakness (generalized): Secondary | ICD-10-CM | POA: Diagnosis not present

## 2018-12-22 DIAGNOSIS — I70203 Unspecified atherosclerosis of native arteries of extremities, bilateral legs: Secondary | ICD-10-CM | POA: Diagnosis not present

## 2018-12-29 DIAGNOSIS — Z20828 Contact with and (suspected) exposure to other viral communicable diseases: Secondary | ICD-10-CM | POA: Diagnosis not present

## 2019-01-12 DIAGNOSIS — Z23 Encounter for immunization: Secondary | ICD-10-CM | POA: Diagnosis not present

## 2019-01-12 DIAGNOSIS — Z85828 Personal history of other malignant neoplasm of skin: Secondary | ICD-10-CM | POA: Diagnosis not present

## 2019-01-12 DIAGNOSIS — D485 Neoplasm of uncertain behavior of skin: Secondary | ICD-10-CM | POA: Diagnosis not present

## 2019-01-14 DIAGNOSIS — Z20828 Contact with and (suspected) exposure to other viral communicable diseases: Secondary | ICD-10-CM | POA: Diagnosis not present

## 2019-01-29 DIAGNOSIS — Z23 Encounter for immunization: Secondary | ICD-10-CM | POA: Diagnosis not present

## 2019-02-02 DIAGNOSIS — Z9981 Dependence on supplemental oxygen: Secondary | ICD-10-CM | POA: Diagnosis not present

## 2019-02-02 DIAGNOSIS — R5381 Other malaise: Secondary | ICD-10-CM | POA: Diagnosis not present

## 2019-02-02 DIAGNOSIS — J449 Chronic obstructive pulmonary disease, unspecified: Secondary | ICD-10-CM | POA: Diagnosis not present

## 2019-02-02 DIAGNOSIS — E119 Type 2 diabetes mellitus without complications: Secondary | ICD-10-CM | POA: Diagnosis not present

## 2019-02-26 DIAGNOSIS — Z23 Encounter for immunization: Secondary | ICD-10-CM | POA: Diagnosis not present

## 2019-03-01 DIAGNOSIS — Z9981 Dependence on supplemental oxygen: Secondary | ICD-10-CM | POA: Diagnosis not present

## 2019-03-01 DIAGNOSIS — E119 Type 2 diabetes mellitus without complications: Secondary | ICD-10-CM | POA: Diagnosis not present

## 2019-03-01 DIAGNOSIS — J449 Chronic obstructive pulmonary disease, unspecified: Secondary | ICD-10-CM | POA: Diagnosis not present

## 2019-03-01 DIAGNOSIS — R197 Diarrhea, unspecified: Secondary | ICD-10-CM | POA: Diagnosis not present

## 2019-03-08 DIAGNOSIS — C44319 Basal cell carcinoma of skin of other parts of face: Secondary | ICD-10-CM | POA: Diagnosis not present

## 2019-03-09 DIAGNOSIS — Z9981 Dependence on supplemental oxygen: Secondary | ICD-10-CM | POA: Diagnosis not present

## 2019-03-09 DIAGNOSIS — T50Z95A Adverse effect of other vaccines and biological substances, initial encounter: Secondary | ICD-10-CM | POA: Diagnosis not present

## 2019-03-09 DIAGNOSIS — E119 Type 2 diabetes mellitus without complications: Secondary | ICD-10-CM | POA: Diagnosis not present

## 2019-03-09 DIAGNOSIS — J449 Chronic obstructive pulmonary disease, unspecified: Secondary | ICD-10-CM | POA: Diagnosis not present

## 2019-03-22 DIAGNOSIS — I10 Essential (primary) hypertension: Secondary | ICD-10-CM | POA: Diagnosis not present

## 2019-03-22 DIAGNOSIS — J449 Chronic obstructive pulmonary disease, unspecified: Secondary | ICD-10-CM | POA: Diagnosis not present

## 2019-03-22 DIAGNOSIS — Z9981 Dependence on supplemental oxygen: Secondary | ICD-10-CM | POA: Diagnosis not present

## 2019-03-23 DIAGNOSIS — R0602 Shortness of breath: Secondary | ICD-10-CM | POA: Diagnosis not present

## 2019-03-26 ENCOUNTER — Other Ambulatory Visit (HOSPITAL_COMMUNITY): Payer: Self-pay | Admitting: Physician Assistant

## 2019-03-26 DIAGNOSIS — R0602 Shortness of breath: Secondary | ICD-10-CM

## 2019-03-31 ENCOUNTER — Ambulatory Visit (HOSPITAL_COMMUNITY)
Admission: RE | Admit: 2019-03-31 | Discharge: 2019-03-31 | Disposition: A | Discharge status: Home / Self Care | Payer: Medicare Other | Source: Ambulatory Visit | Attending: Family Medicine | Admitting: Family Medicine

## 2019-03-31 ENCOUNTER — Other Ambulatory Visit: Payer: Self-pay

## 2019-03-31 DIAGNOSIS — R0602 Shortness of breath: Secondary | ICD-10-CM | POA: Diagnosis not present

## 2019-03-31 DIAGNOSIS — R9431 Abnormal electrocardiogram [ECG] [EKG]: Secondary | ICD-10-CM | POA: Insufficient documentation

## 2019-04-12 DIAGNOSIS — C44319 Basal cell carcinoma of skin of other parts of face: Secondary | ICD-10-CM | POA: Diagnosis not present

## 2019-04-26 DIAGNOSIS — K219 Gastro-esophageal reflux disease without esophagitis: Secondary | ICD-10-CM | POA: Diagnosis not present

## 2019-04-26 DIAGNOSIS — I10 Essential (primary) hypertension: Secondary | ICD-10-CM | POA: Diagnosis not present

## 2019-04-26 DIAGNOSIS — J449 Chronic obstructive pulmonary disease, unspecified: Secondary | ICD-10-CM | POA: Diagnosis not present

## 2019-04-26 DIAGNOSIS — Z9981 Dependence on supplemental oxygen: Secondary | ICD-10-CM | POA: Diagnosis not present

## 2019-05-12 DIAGNOSIS — E785 Hyperlipidemia, unspecified: Secondary | ICD-10-CM | POA: Diagnosis not present

## 2019-05-12 DIAGNOSIS — Z79899 Other long term (current) drug therapy: Secondary | ICD-10-CM | POA: Diagnosis not present

## 2019-05-12 DIAGNOSIS — D649 Anemia, unspecified: Secondary | ICD-10-CM | POA: Diagnosis not present

## 2019-05-12 DIAGNOSIS — E039 Hypothyroidism, unspecified: Secondary | ICD-10-CM | POA: Diagnosis not present

## 2019-05-12 DIAGNOSIS — E559 Vitamin D deficiency, unspecified: Secondary | ICD-10-CM | POA: Diagnosis not present

## 2019-05-12 DIAGNOSIS — D519 Vitamin B12 deficiency anemia, unspecified: Secondary | ICD-10-CM | POA: Diagnosis not present

## 2019-05-12 DIAGNOSIS — E1140 Type 2 diabetes mellitus with diabetic neuropathy, unspecified: Secondary | ICD-10-CM | POA: Diagnosis not present

## 2019-05-17 DIAGNOSIS — Z9981 Dependence on supplemental oxygen: Secondary | ICD-10-CM | POA: Diagnosis not present

## 2019-05-17 DIAGNOSIS — J449 Chronic obstructive pulmonary disease, unspecified: Secondary | ICD-10-CM | POA: Diagnosis not present

## 2019-05-17 DIAGNOSIS — E119 Type 2 diabetes mellitus without complications: Secondary | ICD-10-CM | POA: Diagnosis not present

## 2019-05-17 DIAGNOSIS — D696 Thrombocytopenia, unspecified: Secondary | ICD-10-CM | POA: Diagnosis not present

## 2019-05-17 DIAGNOSIS — I10 Essential (primary) hypertension: Secondary | ICD-10-CM | POA: Diagnosis not present

## 2019-05-22 DIAGNOSIS — M545 Low back pain: Secondary | ICD-10-CM | POA: Diagnosis not present

## 2019-05-24 ENCOUNTER — Emergency Department (HOSPITAL_COMMUNITY): Payer: Medicare Other

## 2019-05-24 ENCOUNTER — Other Ambulatory Visit: Payer: Self-pay

## 2019-05-24 ENCOUNTER — Inpatient Hospital Stay (HOSPITAL_COMMUNITY)
Admission: EM | Admit: 2019-05-24 | Discharge: 2019-05-31 | DRG: 280 | Disposition: A | Discharge status: Home / Self Care | Payer: Medicare Other | Source: Skilled Nursing Facility | Attending: Internal Medicine | Admitting: Internal Medicine

## 2019-05-24 ENCOUNTER — Inpatient Hospital Stay (HOSPITAL_COMMUNITY): Payer: Medicare Other

## 2019-05-24 ENCOUNTER — Encounter (HOSPITAL_COMMUNITY): Payer: Self-pay | Admitting: *Deleted

## 2019-05-24 DIAGNOSIS — E875 Hyperkalemia: Secondary | ICD-10-CM | POA: Diagnosis not present

## 2019-05-24 DIAGNOSIS — N183 Chronic kidney disease, stage 3 unspecified: Secondary | ICD-10-CM | POA: Diagnosis present

## 2019-05-24 DIAGNOSIS — E871 Hypo-osmolality and hyponatremia: Secondary | ICD-10-CM | POA: Diagnosis present

## 2019-05-24 DIAGNOSIS — M1611 Unilateral primary osteoarthritis, right hip: Secondary | ICD-10-CM | POA: Diagnosis present

## 2019-05-24 DIAGNOSIS — R0602 Shortness of breath: Secondary | ICD-10-CM | POA: Diagnosis not present

## 2019-05-24 DIAGNOSIS — Z7982 Long term (current) use of aspirin: Secondary | ICD-10-CM

## 2019-05-24 DIAGNOSIS — I10 Essential (primary) hypertension: Secondary | ICD-10-CM | POA: Diagnosis present

## 2019-05-24 DIAGNOSIS — E1122 Type 2 diabetes mellitus with diabetic chronic kidney disease: Secondary | ICD-10-CM | POA: Diagnosis present

## 2019-05-24 DIAGNOSIS — Z888 Allergy status to other drugs, medicaments and biological substances status: Secondary | ICD-10-CM

## 2019-05-24 DIAGNOSIS — Z8673 Personal history of transient ischemic attack (TIA), and cerebral infarction without residual deficits: Secondary | ICD-10-CM

## 2019-05-24 DIAGNOSIS — I5033 Acute on chronic diastolic (congestive) heart failure: Secondary | ICD-10-CM | POA: Diagnosis present

## 2019-05-24 DIAGNOSIS — Z7984 Long term (current) use of oral hypoglycemic drugs: Secondary | ICD-10-CM | POA: Diagnosis not present

## 2019-05-24 DIAGNOSIS — R778 Other specified abnormalities of plasma proteins: Secondary | ICD-10-CM | POA: Insufficient documentation

## 2019-05-24 DIAGNOSIS — J9601 Acute respiratory failure with hypoxia: Secondary | ICD-10-CM | POA: Diagnosis present

## 2019-05-24 DIAGNOSIS — Z20822 Contact with and (suspected) exposure to covid-19: Secondary | ICD-10-CM | POA: Diagnosis present

## 2019-05-24 DIAGNOSIS — Z87891 Personal history of nicotine dependence: Secondary | ICD-10-CM

## 2019-05-24 DIAGNOSIS — E119 Type 2 diabetes mellitus without complications: Secondary | ICD-10-CM | POA: Diagnosis not present

## 2019-05-24 DIAGNOSIS — M479 Spondylosis, unspecified: Secondary | ICD-10-CM | POA: Diagnosis present

## 2019-05-24 DIAGNOSIS — R600 Localized edema: Secondary | ICD-10-CM | POA: Diagnosis not present

## 2019-05-24 DIAGNOSIS — J441 Chronic obstructive pulmonary disease with (acute) exacerbation: Secondary | ICD-10-CM | POA: Diagnosis not present

## 2019-05-24 DIAGNOSIS — I214 Non-ST elevation (NSTEMI) myocardial infarction: Secondary | ICD-10-CM | POA: Diagnosis not present

## 2019-05-24 DIAGNOSIS — E785 Hyperlipidemia, unspecified: Secondary | ICD-10-CM | POA: Diagnosis present

## 2019-05-24 DIAGNOSIS — Z8701 Personal history of pneumonia (recurrent): Secondary | ICD-10-CM

## 2019-05-24 DIAGNOSIS — E1169 Type 2 diabetes mellitus with other specified complication: Secondary | ICD-10-CM | POA: Diagnosis not present

## 2019-05-24 DIAGNOSIS — Z85828 Personal history of other malignant neoplasm of skin: Secondary | ICD-10-CM

## 2019-05-24 DIAGNOSIS — E1151 Type 2 diabetes mellitus with diabetic peripheral angiopathy without gangrene: Secondary | ICD-10-CM | POA: Diagnosis not present

## 2019-05-24 DIAGNOSIS — Z9981 Dependence on supplemental oxygen: Secondary | ICD-10-CM | POA: Diagnosis not present

## 2019-05-24 DIAGNOSIS — M549 Dorsalgia, unspecified: Secondary | ICD-10-CM | POA: Diagnosis present

## 2019-05-24 DIAGNOSIS — F419 Anxiety disorder, unspecified: Secondary | ICD-10-CM | POA: Diagnosis present

## 2019-05-24 DIAGNOSIS — J9620 Acute and chronic respiratory failure, unspecified whether with hypoxia or hypercapnia: Secondary | ICD-10-CM | POA: Diagnosis not present

## 2019-05-24 DIAGNOSIS — J42 Unspecified chronic bronchitis: Secondary | ICD-10-CM | POA: Diagnosis not present

## 2019-05-24 DIAGNOSIS — J9621 Acute and chronic respiratory failure with hypoxia: Secondary | ICD-10-CM | POA: Diagnosis present

## 2019-05-24 DIAGNOSIS — N179 Acute kidney failure, unspecified: Secondary | ICD-10-CM | POA: Diagnosis present

## 2019-05-24 DIAGNOSIS — N1831 Chronic kidney disease, stage 3a: Secondary | ICD-10-CM | POA: Diagnosis not present

## 2019-05-24 DIAGNOSIS — I5021 Acute systolic (congestive) heart failure: Secondary | ICD-10-CM | POA: Diagnosis not present

## 2019-05-24 DIAGNOSIS — M2550 Pain in unspecified joint: Secondary | ICD-10-CM | POA: Diagnosis not present

## 2019-05-24 DIAGNOSIS — R0902 Hypoxemia: Secondary | ICD-10-CM

## 2019-05-24 DIAGNOSIS — N170 Acute kidney failure with tubular necrosis: Secondary | ICD-10-CM | POA: Diagnosis not present

## 2019-05-24 DIAGNOSIS — E861 Hypovolemia: Secondary | ICD-10-CM | POA: Diagnosis present

## 2019-05-24 DIAGNOSIS — Z881 Allergy status to other antibiotic agents status: Secondary | ICD-10-CM

## 2019-05-24 DIAGNOSIS — I509 Heart failure, unspecified: Secondary | ICD-10-CM | POA: Diagnosis not present

## 2019-05-24 DIAGNOSIS — Z79899 Other long term (current) drug therapy: Secondary | ICD-10-CM | POA: Diagnosis not present

## 2019-05-24 DIAGNOSIS — J9600 Acute respiratory failure, unspecified whether with hypoxia or hypercapnia: Secondary | ICD-10-CM | POA: Diagnosis not present

## 2019-05-24 DIAGNOSIS — I130 Hypertensive heart and chronic kidney disease with heart failure and stage 1 through stage 4 chronic kidney disease, or unspecified chronic kidney disease: Secondary | ICD-10-CM | POA: Diagnosis not present

## 2019-05-24 DIAGNOSIS — Z66 Do not resuscitate: Secondary | ICD-10-CM | POA: Diagnosis present

## 2019-05-24 DIAGNOSIS — N182 Chronic kidney disease, stage 2 (mild): Secondary | ICD-10-CM | POA: Diagnosis not present

## 2019-05-24 DIAGNOSIS — Z91041 Radiographic dye allergy status: Secondary | ICD-10-CM

## 2019-05-24 DIAGNOSIS — Z7401 Bed confinement status: Secondary | ICD-10-CM | POA: Diagnosis not present

## 2019-05-24 DIAGNOSIS — Z7951 Long term (current) use of inhaled steroids: Secondary | ICD-10-CM

## 2019-05-24 DIAGNOSIS — R7989 Other specified abnormal findings of blood chemistry: Secondary | ICD-10-CM | POA: Diagnosis not present

## 2019-05-24 DIAGNOSIS — R05 Cough: Secondary | ICD-10-CM | POA: Diagnosis not present

## 2019-05-24 LAB — CBC WITH DIFFERENTIAL/PLATELET
Abs Immature Granulocytes: 0.02 10*3/uL (ref 0.00–0.07)
Basophils Absolute: 0 10*3/uL (ref 0.0–0.1)
Basophils Relative: 0 %
Eosinophils Absolute: 0.1 10*3/uL (ref 0.0–0.5)
Eosinophils Relative: 1 %
HCT: 31.6 % — ABNORMAL LOW (ref 39.0–52.0)
Hemoglobin: 11.8 g/dL — ABNORMAL LOW (ref 13.0–17.0)
Immature Granulocytes: 0 %
Lymphocytes Relative: 14 %
Lymphs Abs: 0.7 10*3/uL (ref 0.7–4.0)
MCH: 40.3 pg — ABNORMAL HIGH (ref 26.0–34.0)
MCHC: 37.3 g/dL — ABNORMAL HIGH (ref 30.0–36.0)
MCV: 107.8 fL — ABNORMAL HIGH (ref 80.0–100.0)
Monocytes Absolute: 0.4 10*3/uL (ref 0.1–1.0)
Monocytes Relative: 7 %
Neutro Abs: 4.3 10*3/uL (ref 1.7–7.7)
Neutrophils Relative %: 78 %
Platelets: 122 10*3/uL — ABNORMAL LOW (ref 150–400)
RBC: 2.93 MIL/uL — ABNORMAL LOW (ref 4.22–5.81)
RDW: 17 % — ABNORMAL HIGH (ref 11.5–15.5)
WBC: 5.5 10*3/uL (ref 4.0–10.5)
nRBC: 0 % (ref 0.0–0.2)

## 2019-05-24 LAB — BRAIN NATRIURETIC PEPTIDE: B Natriuretic Peptide: 1733.6 pg/mL — ABNORMAL HIGH (ref 0.0–100.0)

## 2019-05-24 LAB — POCT I-STAT EG7
Acid-Base Excess: 1 mmol/L (ref 0.0–2.0)
Bicarbonate: 23.1 mmol/L (ref 20.0–28.0)
Calcium, Ion: 1.07 mmol/L — ABNORMAL LOW (ref 1.15–1.40)
HCT: 35 % — ABNORMAL LOW (ref 39.0–52.0)
Hemoglobin: 11.9 g/dL — ABNORMAL LOW (ref 13.0–17.0)
O2 Saturation: 100 %
Potassium: 5.5 mmol/L — ABNORMAL HIGH (ref 3.5–5.1)
Sodium: 132 mmol/L — ABNORMAL LOW (ref 135–145)
TCO2: 24 mmol/L (ref 22–32)
pCO2, Ven: 28.8 mmHg — ABNORMAL LOW (ref 44.0–60.0)
pH, Ven: 7.513 — ABNORMAL HIGH (ref 7.250–7.430)
pO2, Ven: 227 mmHg — ABNORMAL HIGH (ref 32.0–45.0)

## 2019-05-24 LAB — COMPREHENSIVE METABOLIC PANEL
ALT: 21 U/L (ref 0–44)
AST: 26 U/L (ref 15–41)
Albumin: 3.4 g/dL — ABNORMAL LOW (ref 3.5–5.0)
Alkaline Phosphatase: 67 U/L (ref 38–126)
Anion gap: 10 (ref 5–15)
BUN: 41 mg/dL — ABNORMAL HIGH (ref 8–23)
CO2: 19 mmol/L — ABNORMAL LOW (ref 22–32)
Calcium: 9 mg/dL (ref 8.9–10.3)
Chloride: 105 mmol/L (ref 98–111)
Creatinine, Ser: 1.85 mg/dL — ABNORMAL HIGH (ref 0.61–1.24)
GFR calc Af Amer: 42 mL/min — ABNORMAL LOW (ref >60)
GFR calc non Af Amer: 36 mL/min — ABNORMAL LOW (ref >60)
Glucose, Bld: 224 mg/dL — ABNORMAL HIGH (ref 70–99)
Potassium: 5.5 mmol/L — ABNORMAL HIGH (ref 3.5–5.1)
Sodium: 134 mmol/L — ABNORMAL LOW (ref 135–145)
Total Bilirubin: 1.1 mg/dL (ref 0.3–1.2)
Total Protein: 6.1 g/dL — ABNORMAL LOW (ref 6.5–8.1)

## 2019-05-24 LAB — CBG MONITORING, ED
Glucose-Capillary: 180 mg/dL — ABNORMAL HIGH (ref 70–99)
Glucose-Capillary: 216 mg/dL — ABNORMAL HIGH (ref 70–99)

## 2019-05-24 LAB — SARS CORONAVIRUS 2 BY RT PCR (HOSPITAL ORDER, PERFORMED IN ~~LOC~~ HOSPITAL LAB): SARS Coronavirus 2: NEGATIVE

## 2019-05-24 LAB — HEMOGLOBIN A1C
Hgb A1c MFr Bld: 8.5 % — ABNORMAL HIGH (ref 4.8–5.6)
Mean Plasma Glucose: 197.25 mg/dL

## 2019-05-24 LAB — TROPONIN I (HIGH SENSITIVITY)
Troponin I (High Sensitivity): 1480 ng/L (ref <18)
Troponin I (High Sensitivity): 1743 ng/L (ref <18)

## 2019-05-24 LAB — CK: Total CK: 193 U/L (ref 49–397)

## 2019-05-24 MED ORDER — ONDANSETRON HCL 4 MG/2ML IJ SOLN: 4.0000 mg | Freq: Four times a day (QID) | INTRAMUSCULAR | Status: DC | PRN

## 2019-05-24 MED ORDER — SODIUM CHLORIDE 0.9 % IV SOLN: 250.0000 mL | INTRAVENOUS | Status: DC | PRN

## 2019-05-24 MED ORDER — HYDROCODONE-ACETAMINOPHEN 5-325 MG PO TABS
1.0000 | ORAL_TABLET | Freq: Two times a day (BID) | ORAL | Status: DC | PRN
  Administered 2019-05-24 – 2019-05-31 (×14): 1 via ORAL
  Filled 2019-05-24 (×15): qty 1

## 2019-05-24 MED ORDER — SODIUM ZIRCONIUM CYCLOSILICATE 10 G PO PACK
10.0000 g | PACK | Freq: Once | ORAL | Status: AC
  Administered 2019-05-24: 10 g via ORAL
  Filled 2019-05-24: qty 1

## 2019-05-24 MED ORDER — PANTOPRAZOLE SODIUM 40 MG PO TBEC
40.0000 mg | DELAYED_RELEASE_TABLET | Freq: Two times a day (BID) | ORAL | Status: DC
  Administered 2019-05-24 – 2019-05-31 (×14): 40 mg via ORAL
  Filled 2019-05-24 (×14): qty 1

## 2019-05-24 MED ORDER — HEPARIN (PORCINE) 25000 UT/250ML-% IV SOLN
1200.0000 [IU]/h | INTRAVENOUS | Status: DC
  Administered 2019-05-24: 950 [IU]/h via INTRAVENOUS
  Administered 2019-05-26: 1300 [IU]/h via INTRAVENOUS
  Filled 2019-05-24 (×3): qty 250

## 2019-05-24 MED ORDER — CALCIUM CARBONATE ANTACID 500 MG PO CHEW: 1.0000 | CHEWABLE_TABLET | Freq: Every day | ORAL | Status: DC | PRN

## 2019-05-24 MED ORDER — FLUTICASONE FUROATE-VILANTEROL 100-25 MCG/INH IN AEPB
1.0000 | INHALATION_SPRAY | Freq: Every day | RESPIRATORY_TRACT | Status: DC
  Administered 2019-05-26 – 2019-05-31 (×6): 1 via RESPIRATORY_TRACT
  Filled 2019-05-24: qty 28

## 2019-05-24 MED ORDER — ATORVASTATIN CALCIUM 10 MG PO TABS
20.0000 mg | ORAL_TABLET | Freq: Every day | ORAL | Status: DC
  Administered 2019-05-24 – 2019-05-25 (×2): 20 mg via ORAL
  Filled 2019-05-24 (×2): qty 2

## 2019-05-24 MED ORDER — INSULIN ASPART 100 UNIT/ML ~~LOC~~ SOLN
0.0000 [IU] | Freq: Three times a day (TID) | SUBCUTANEOUS | Status: DC
  Administered 2019-05-24 – 2019-05-25 (×2): 2 [IU] via SUBCUTANEOUS
  Administered 2019-05-25: 1 [IU] via SUBCUTANEOUS
  Administered 2019-05-25: 3 [IU] via SUBCUTANEOUS
  Administered 2019-05-26 – 2019-05-27 (×3): 2 [IU] via SUBCUTANEOUS
  Administered 2019-05-27: 5 [IU] via SUBCUTANEOUS
  Administered 2019-05-27: 1 [IU] via SUBCUTANEOUS
  Administered 2019-05-28: 5 [IU] via SUBCUTANEOUS
  Administered 2019-05-28: 2 [IU] via SUBCUTANEOUS
  Administered 2019-05-28: 7 [IU] via SUBCUTANEOUS
  Administered 2019-05-29: 9 [IU] via SUBCUTANEOUS
  Administered 2019-05-29: 3 [IU] via SUBCUTANEOUS
  Administered 2019-05-29: 7 [IU] via SUBCUTANEOUS
  Administered 2019-05-30: 2 [IU] via SUBCUTANEOUS
  Administered 2019-05-30 (×2): 5 [IU] via SUBCUTANEOUS
  Administered 2019-05-31: 3 [IU] via SUBCUTANEOUS
  Administered 2019-05-31: 2 [IU] via SUBCUTANEOUS
  Administered 2019-05-31: 7 [IU] via SUBCUTANEOUS

## 2019-05-24 MED ORDER — GABAPENTIN 300 MG PO CAPS
300.0000 mg | ORAL_CAPSULE | Freq: Every morning | ORAL | Status: DC
  Administered 2019-05-25 – 2019-05-31 (×7): 300 mg via ORAL
  Filled 2019-05-24 (×6): qty 1
  Filled 2019-05-24: qty 3

## 2019-05-24 MED ORDER — HEPARIN BOLUS VIA INFUSION
4000.0000 [IU] | Freq: Once | INTRAVENOUS | Status: AC
  Administered 2019-05-24: 4000 [IU] via INTRAVENOUS
  Filled 2019-05-24: qty 4000

## 2019-05-24 MED ORDER — PREDNISOLONE 5 MG PO TABS
40.0000 mg | ORAL_TABLET | Freq: Every day | ORAL | Status: DC
  Administered 2019-05-24: 40 mg via ORAL
  Filled 2019-05-24 (×2): qty 8

## 2019-05-24 MED ORDER — ASPIRIN EC 81 MG PO TBEC: 81.0000 mg | DELAYED_RELEASE_TABLET | Freq: Every day | ORAL | Status: DC

## 2019-05-24 MED ORDER — ACETAMINOPHEN 325 MG PO TABS: 650.0000 mg | ORAL_TABLET | ORAL | Status: DC | PRN

## 2019-05-24 MED ORDER — TRAZODONE HCL 50 MG PO TABS
50.0000 mg | ORAL_TABLET | Freq: Every day | ORAL | Status: DC
  Administered 2019-05-24 – 2019-05-30 (×7): 50 mg via ORAL
  Filled 2019-05-24 (×7): qty 1

## 2019-05-24 MED ORDER — METOPROLOL TARTRATE 25 MG PO TABS
12.5000 mg | ORAL_TABLET | Freq: Two times a day (BID) | ORAL | Status: DC
  Administered 2019-05-24: 12.5 mg via ORAL
  Filled 2019-05-24: qty 1

## 2019-05-24 MED ORDER — FUROSEMIDE 20 MG PO TABS: 20.0000 mg | ORAL_TABLET | Freq: Every day | ORAL | Status: DC | PRN

## 2019-05-24 MED ORDER — ALBUTEROL SULFATE (2.5 MG/3ML) 0.083% IN NEBU
2.5000 mg | INHALATION_SOLUTION | Freq: Four times a day (QID) | RESPIRATORY_TRACT | Status: DC | PRN
  Administered 2019-05-28: 2.5 mg via RESPIRATORY_TRACT
  Filled 2019-05-24: qty 3

## 2019-05-24 MED ORDER — SODIUM CHLORIDE 0.9% FLUSH
3.0000 mL | Freq: Two times a day (BID) | INTRAVENOUS | Status: DC
  Administered 2019-05-24 – 2019-05-30 (×11): 3 mL via INTRAVENOUS

## 2019-05-24 MED ORDER — GUAIFENESIN ER 600 MG PO TB12: 600.0000 mg | ORAL_TABLET | Freq: Two times a day (BID) | ORAL | Status: DC

## 2019-05-24 MED ORDER — SERTRALINE HCL 100 MG PO TABS
100.0000 mg | ORAL_TABLET | Freq: Every day | ORAL | Status: DC
  Administered 2019-05-25 – 2019-05-31 (×7): 100 mg via ORAL
  Filled 2019-05-24 (×7): qty 1

## 2019-05-24 MED ORDER — TAMSULOSIN HCL 0.4 MG PO CAPS
0.4000 mg | ORAL_CAPSULE | Freq: Every day | ORAL | Status: DC
  Administered 2019-05-24 – 2019-05-30 (×7): 0.4 mg via ORAL
  Filled 2019-05-24 (×7): qty 1

## 2019-05-24 MED ORDER — TRAMADOL HCL 50 MG PO TABS
50.0000 mg | ORAL_TABLET | ORAL | Status: DC | PRN
  Administered 2019-05-27: 50 mg via ORAL
  Filled 2019-05-24: qty 1

## 2019-05-24 MED ORDER — ATENOLOL 50 MG PO TABS: 50.0000 mg | ORAL_TABLET | Freq: Every day | ORAL | Status: DC

## 2019-05-24 MED ORDER — SODIUM CHLORIDE 0.9% FLUSH
3.0000 mL | INTRAVENOUS | Status: DC | PRN
  Administered 2019-05-31: 3 mL via INTRAVENOUS

## 2019-05-24 MED ORDER — HEPARIN SODIUM (PORCINE) 5000 UNIT/ML IJ SOLN: 5000.0000 [IU] | Freq: Two times a day (BID) | INTRAMUSCULAR | Status: DC

## 2019-05-24 MED ORDER — FUROSEMIDE 10 MG/ML IJ SOLN
40.0000 mg | Freq: Two times a day (BID) | INTRAMUSCULAR | Status: DC
  Administered 2019-05-24 – 2019-05-28 (×9): 40 mg via INTRAVENOUS
  Filled 2019-05-24 (×9): qty 4

## 2019-05-24 NOTE — H&P (Signed)
History and Physical    Adrian Andrews [id number] DOB: 10/20/1950 DOA: 05/24/2019  PCP: [name], MD   Patient coming from: Assisted living  I have personally briefly reviewed patient's old medical records in [place]  Chief Complaint: Short of breath  HPI: Adrian Andrews is a 69 y.o. male with medical history significant of chronic hypoxic respiratory failure secondary COPD, IDDM, hypertension, HLD, presented with increasing short of breath for 4 days.  Symptoms more cough with whitish phlegm, wheezing, denies any chest pain, no fever chills.  Symptoms gradually getting worse, today even minimum activity can induce significant dyspnea. ED Course: Patient was found to be cyanotic, O2 saturation dropped to lower 80s on ambulation.  Potassium 5.5, bicarb 19, creatinine 1.8, troponin 1400 first set then 1700 second set.  EKG showed no significant ST-T changes  Review of Systems: As per HPI otherwise 10 point review of systems negative.    Past Medical History:  Diagnosis Date  . Anxiety   . Arthritis    back, right hip  . Chronic kidney disease    stage 3  . COPD (chronic obstructive pulmonary disease) ([place])    on 3L home O2  . Diabetes mellitus without complication ([place])   . Dyspnea   . Hyperlipidemia   . Hypertension   . Peripheral vascular disease ([place])    Carotid artery stenosis, s/p bilat stents  . Skin cancer    basal cell right cheek, left ear  . Stroke [facility])    noted on MRI    Past Surgical History:  Procedure Laterality Date  . ENDARTERECTOMY Left 02/21/2017   Procedure: LEFT CAROTID ENDARTERECTOMY;  Surgeon: Rosetta Posner, MD;  [facility] OR;  Service: Vascular;  Laterality: Left;  . lower extremity stenting    . PATCH ANGIOPLASTY Left 02/21/2017   Procedure: PATCH ANGIOPLASTY OF LEFT CAROTID ARTERY USING HEMASHIELD PLATINUM FINESSE PATCH;  Surgeon: Rosetta Posner, MD;  Location: [place];  Service: Vascular;  Laterality: Left;     reports that he  quit smoking about 13 months ago. His smoking use included cigarettes. He has a 120.00 pack-year smoking history. He has never used smokeless tobacco. He reports that he does not drink alcohol or use drugs.  Allergies  Allergen Reactions  . Clindamycin/Lincomycin     Throat tightens up  . Contrast Media [Iodinated Diagnostic Agents] Shortness Of Breath    11/20/2004 reaction with CTA CAP  . Metformin And Related Other (See Comments)    Effected kidney function    No family history on file.   Prior to Admission medications   Medication Sig Start Date End Date Taking? Authorizing Provider  acetaminophen (TYLENOL) 325 MG tablet Take 2 tablets (650 mg total) by mouth every 6 (six) hours as needed for mild pain or headache. 09/28/18  Yes Swayze, Ava, DO  albuterol (VENTOLIN HFA) 108 (90 Base) MCG/ACT inhaler Inhale 2 puffs into the lungs every 6 (six) hours as needed for wheezing or shortness of breath. 08/10/18  Yes Olalere, Adewale A, MD  aspirin EC 81 MG tablet Take 81 mg by mouth daily.   Yes [provider]  atenolol (TENORMIN) 50 MG tablet Take 50 mg by mouth daily.   Yes [provider]  atorvastatin (LIPITOR) 40 MG tablet Take 20 mg by mouth at bedtime.    Yes [provider]  budesonide-formoterol (SYMBICORT) 80-4.5 MCG/ACT inhaler Inhale 1 puff into the lungs 2 (two) times daily.   Yes [provider]  calcium carbonate (TUMS EX) 750 MG chewable tablet Chew 1 tablet by mouth daily as needed (For indegestion or nausea).   Yes [provider]  furosemide (LASIX) 20 MG tablet Take 20 mg by mouth daily as needed for edema.   Yes [provider]  gabapentin (NEURONTIN) 300 MG capsule Take 1 capsule (300 mg total) by mouth every morning. 09/29/18  Yes Swayze, Ava, DO  glipiZIDE (GLUCOTROL) 10 MG tablet Take 1 tablet (10 mg total) by mouth 2 (two) times daily before a meal. 02/16/18  Yes Rai, Ripudeep K, MD  Glycopyrrolate-Formoterol (BEVESPI  AEROSPHERE) 9-4.8 MCG/ACT AERO Inhale 2 puffs into the lungs 2 (two) times daily. 08/10/18  Yes Olalere, Adewale A, MD  HYDROcodone-acetaminophen (NORCO/VICODIN) 5-325 MG tablet Take 1 tablet by mouth 2 (two) times daily as needed for moderate pain.   Yes [provider]  pioglitazone (ACTOS) 30 MG tablet Take 30 mg by mouth daily.   Yes [provider]  sertraline (ZOLOFT) 100 MG tablet Take 100 mg by mouth daily.   Yes [provider]  tamsulosin (FLOMAX) 0.4 MG CAPS capsule Take 0.4 mg by mouth at bedtime.   Yes [provider]  traMADol (ULTRAM) 50 MG tablet Take 1 tablet (50 mg total) by mouth daily as needed for severe pain (arthritis). Patient taking differently: Take 50 mg by mouth every 4 (four) hours as needed for moderate pain.  09/28/18  Yes Swayze, Ava, DO  traZODone (DESYREL) 50 MG tablet Take 50 mg by mouth at bedtime. 01/29/18  Yes [provider]  fluticasone (FLONASE) 50 MCG/ACT nasal spray Place 2 sprays into both nostrils daily. Patient not taking: Reported on 05/24/2019 09/29/18   Swayze, Ava, DO  gabapentin (NEURONTIN) 300 MG capsule Take 2 capsules (600 mg total) by mouth at bedtime. Patient not taking: Reported on 05/24/2019 09/28/18   Swayze, Ava, DO  guaiFENesin (MUCINEX) 600 MG 12 hr tablet Take 1 tablet (600 mg total) by mouth 2 (two) times daily. Patient not taking: Reported on 05/24/2019 02/16/18   Rai, Vernelle Emerald, MD  metoprolol tartrate (LOPRESSOR) 25 MG tablet Take 0.5 tablets (12.5 mg total) by mouth 2 (two) times daily. Patient not taking: Reported on 05/24/2019 09/28/18   Swayze, Ava, DO  pantoprazole (PROTONIX) 40 MG tablet Take 1 tablet (40 mg total) by mouth 2 (two) times daily. Patient not taking: Reported on 05/24/2019 09/28/18   Swayze, Ava, DO  pioglitazone (ACTOS) 15 MG tablet Take 1 tablet (15 mg total) by mouth daily. Patient not taking: Reported on 05/24/2019 02/16/18   Rai, Vernelle Emerald, MD  polyethylene glycol (MIRALAX /  GLYCOLAX) 17 g packet Take 17 g by mouth daily as needed for moderate constipation. Patient not taking: Reported on 05/24/2019 09/28/18   Karie Kirks, DO    Physical Exam: Vitals:   05/24/19 1515 05/24/19 1616 05/24/19 1700 05/24/19 1826  BP: (!) 159/68  136/70 (!) 153/78  Pulse: 60 64 60 78  Resp: (!) 9 18 12 14   Temp:      TempSrc:      SpO2: 93% 92% 92% 100%  Weight:      Height:        Constitutional: NAD, calm, comfortable Vitals:   05/24/19 1515 05/24/19 1616 05/24/19 1700 05/24/19 1826  BP: (!) 159/68  136/70 (!) 153/78  Pulse: 60 64 60 78  Resp: (!) 9 18 12 14   Temp:      TempSrc:  SpO2: 93% 92% 92% 100%  Weight:      Height:       Eyes: PERRL, lids and conjunctivae normal ENMT: Mucous membranes are moist.  Positive for cyanosis posterior pharynx clear of any exudate or lesions.Normal dentition.  Neck: normal, supple, no masses, no thyromegaly Respiratory: Diffuse wheezing, diminished breath sounds bilaterally. Normal respiratory effort. No accessory muscle use.  Cardiovascular: Regular rate and rhythm, no murmurs / rubs / gallops.  2+ extremity edema. 2+ pedal pulses. No carotid bruits.  Abdomen: no tenderness, no masses palpated. No hepatosplenomegaly. Bowel sounds positive.  Musculoskeletal: no clubbing / cyanosis. No joint deformity upper and lower extremities. Good ROM, no contractures. Normal muscle tone.  Skin: no rashes, lesions, ulcers. No induration Neurologic: CN 2-12 grossly intact. Sensation intact, DTR normal. Strength 5/5 in all 4.  Psychiatric: Normal judgment and insight. Alert and oriented x 3. Normal mood.     Labs on Admission: I have personally reviewed following labs and imaging studies  CBC: Recent Labs  Lab 05/24/19 1129  WBC 5.5  NEUTROABS 4.3  HGB 11.8*  HCT 31.6*  MCV 107.8*  PLT 123XX123*   Basic Metabolic Panel: Recent Labs  Lab 05/24/19 1129  NA 134*  K 5.5*  CL 105  CO2 19*  GLUCOSE 224*  BUN 41*  CREATININE 1.85*    CALCIUM 9.0   GFR: Estimated Creatinine Clearance: 37.2 mL/min (A) (by C-G formula based on SCr of 1.85 mg/dL (H)). Liver Function Tests: Recent Labs  Lab 05/24/19 1129  AST 26  ALT 21  ALKPHOS 67  BILITOT 1.1  PROT 6.1*  ALBUMIN 3.4*   No results for input(s): LIPASE, AMYLASE in the last 168 hours. No results for input(s): AMMONIA in the last 168 hours. Coagulation Profile: No results for input(s): INR, PROTIME in the last 168 hours. Cardiac Enzymes: No results for input(s): CKTOTAL, CKMB, CKMBINDEX, TROPONINI in the last 168 hours. BNP (last 3 results) No results for input(s): PROBNP in the last 8760 hours. HbA1C: No results for input(s): HGBA1C in the last 72 hours. CBG: No results for input(s): GLUCAP in the last 168 hours. Lipid Profile: No results for input(s): CHOL, HDL, LDLCALC, TRIG, CHOLHDL, LDLDIRECT in the last 72 hours. Thyroid Function Tests: No results for input(s): TSH, T4TOTAL, FREET4, T3FREE, THYROIDAB in the last 72 hours. Anemia Panel: No results for input(s): VITAMINB12, FOLATE, FERRITIN, TIBC, IRON, RETICCTPCT in the last 72 hours. Urine analysis:    Component Value Date/Time   COLORURINE YELLOW 09/16/2018 [place] 09/16/2018 1715   LABSPEC 1.006 09/16/2018 1715   PHURINE 6.0 09/16/2018 1715   GLUCOSEU NEGATIVE 09/16/2018 1715   HGBUR MODERATE (A) 09/16/2018 1715   BILIRUBINUR NEGATIVE 09/16/2018 1715   BILIRUBINUR neg 09/10/2013 1308   KETONESUR NEGATIVE 09/16/2018 1715   PROTEINUR NEGATIVE 09/16/2018 1715   UROBILINOGEN 0.2 09/10/2013 1308   NITRITE NEGATIVE 09/16/2018 1715   LEUKOCYTESUR NEGATIVE 09/16/2018 1715    Radiological Exams on Admission: DG Chest Portable 1 View  Result Date: 05/24/2019 CLINICAL DATA:  Shortness of breath, COPD EXAM: PORTABLE CHEST 1 VIEW COMPARISON:  09/20/2018 FINDINGS: Heart size is borderline enlarged, stable. Atherosclerotic calcification of the aortic knob. Mild streaky bibasilar  opacities, right greater than left, favor atelectasis. No pleural effusion. No pneumothorax. IMPRESSION: Mild streaky bibasilar opacities, right greater than left, favor atelectasis. Electronically Signed   By: Davina Poke D.O.   On: 05/24/2019 12:22    EKG: Independently reviewed.  Q waves on V1  V2 probably chronic  Assessment/Plan Active Problems:   CHF (congestive heart failure) (HCC)  Acute on chronic hypoxic respite failure -Suspect there is a new onset of CHF with signs of decompensation no fluid overload, further suspect there is a silent heart attack at some point, will continue to trend troponins, cardiology on board and started ACS meds including heparin drip. agreed with IV Lasix today and then reevaluate tomorrow -Also has signs of COPD exacerbation, he is already on LABA, and Pulmicort, will add p.o. steroid short course  COPD exacerbation -As above  AKI on CKD stage II -Clinical leukocyte fluid overload, suspect cardiorenal syndrome, continue diuresis recheck BMP in a.m.  IIDM uncontrolled -Sliding scale for now in case will need cardiac cath -Consider stop Actos once CHF diagnosis confirmed  HTN Continue home meds, monitor response.  DVT prophylaxis: Heparin drip Code Status: Full code Family Communication: None at bedside Disposition Plan: Condition complicated, likely will need more than 2 midnights hospital stay Consults called: Cardiology Admission status: Telemetry admission   Lequita Halt MD [facility] Pager [phone]  05/24/2019, 7:12 PM

## 2019-05-24 NOTE — ED Triage Notes (Signed)
Pt here via [facility] from [facility] for increasing sob over 4 days.  He states normally on 3 L [place] when he ambulates the hallways and his  O2 normally runs from 89 - 91 on 3L.  He states the sob increases when he bends over to tie his shoes.  Per staff, his sats dropped into the 80's when ambulating today.

## 2019-05-24 NOTE — ED Notes (Signed)
This Rn was called by [place] department due to pt in severe resp distress. When this Rn arrived to radiology pt was cyanotic, rr labored and fast. NRB applied and pt transported back to treatment room. Pt was disconnected from his oxygen while using the restroom in radiology. Pt color returned, resp less labored and pt states he feels better,  Pt returned to his 3L [place].

## 2019-05-24 NOTE — Consult Note (Addendum)
Cardiology consultation:   Patient ID: KUNJ SAFRIT MRN: [id number]; DOB: Jul 20, 1950   Admission date: 05/24/2019  Primary Care Provider: [name], MD Primary Cardiologist: Minus Breeding, MD   Chief Complaint:  SOB  Patient Profile:   JOHNATAN LYNDON is a 69 y.o. male with a hx of hypertension, hyperlipidemia, carotid artery disease status post left carotid endarterectomy, COPD, stroke, diabetes mellitus and prior tobacco smoker (quit 04/2018) who is being seen today for the evaluation of shortness of breath and elevated troponin.   History of Present Illness:   Mr. Hughbanks is a 69yo M with a hx as stated above who presented to [facility] on 05/24/19 from [place] with increased shortness of breath x4 days. Reports he wears 3 L nasal cannula with ambulation at baseline with O2 saturations in the low 90 range. He states that over the last several weeks he has noticed  progressive exertional SOB. Previously he would tolerate walking with minimal complication however more recently states that this has become more difficult and causes worsening fatigue. He lives in an assisted living facility. 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 He reports three pillow orthopnea that is essentially unchanged from his baseline. Denies chest pain, change in LE edema, no coughing, fevers, chills, dizziness, palpitations or syncope. Has a significant productive cough. He saw the MD at the ALF who prescribed him inhalers with mild relief. Given his progressive symptoms and hypoxia,  EMS was called for further evaluation.   On ED arrival, hsT found to be elevated at 1480 with no complaints of chest pain. CXR with mild streaky bibasilar opacities, right greater than left, favoring atelectasis. BNP elevated at 1733. Creatinine elevated at 1.85 with a  baseline of 1.1.   He was last seen by our service during hospital consultation 09/22/2018 for evaluation of elevated troponin.  At that time he was admitted for sepsis secondary to right lower lobe pneumonia and acute kidney injury. Hospital course was complicated by severe hypoxic respiratory failure secondary to cavitary pneumonia as well as an episode NSVT.  EKG at that time revealed ST depression in leads V4 through V6 with initial troponin at 61 and a peak of 131. Plan was to pursue NST to rule out coronary ischemia. Stress test performed 09/23/2018 was considered low risk nuclear study with a mild, mostly fixed moderate sized inferior wall defect suspected to be nontransmural infarct with minimal peri-infarct ischemia in the right coronary distribution.  Follow-up echocardiogram showed preserved LV function without wall motion abnormality.  Troponin elevation felt not to be secondary to ACS with suspected demand ischemia from PNA and anemia with no plans for further cardiac work-up. He was then placed on metoprolol 25 mg twice daily for NSVT.   Past Medical History:  Diagnosis Date  . Anxiety   . Arthritis    back, right hip  . Chronic kidney disease    stage 3  . COPD (chronic obstructive pulmonary disease) ([place])    on 3L home O2  . Diabetes mellitus without complication ([place])   . Dyspnea   . Hyperlipidemia   . Hypertension   . Peripheral vascular disease ([place])    Carotid artery stenosis, s/p bilat stents  . Skin cancer    basal cell right cheek, left ear  . Stroke [facility])    noted  on MRI    Past Surgical History:  Procedure Laterality Date  . ENDARTERECTOMY Left 02/21/2017   Procedure: LEFT CAROTID ENDARTERECTOMY;  Surgeon: Rosetta Posner, MD;  Location: [facility] OR;  Service: Vascular;  Laterality: Left;  . lower extremity stenting    . PATCH ANGIOPLASTY Left 02/21/2017   Procedure: PATCH ANGIOPLASTY OF LEFT CAROTID ARTERY USING HEMASHIELD PLATINUM FINESSE PATCH;  Surgeon: Rosetta Posner, MD;   Location: [facility] OR;  Service: Vascular;  Laterality: Left;     Medications Prior to Admission: Prior to Admission medications   Medication Sig Start Date End Date Taking? Authorizing Provider  acetaminophen (TYLENOL) 325 MG tablet Take 2 tablets (650 mg total) by mouth every 6 (six) hours as needed for mild pain or headache. 09/28/18   Swayze, Ava, DO  albuterol (VENTOLIN HFA) 108 (90 Base) MCG/ACT inhaler Inhale 2 puffs into the lungs every 6 (six) hours as needed for wheezing or shortness of breath. 08/10/18   Olalere, Adewale A, MD  aspirin EC 81 MG tablet Take 81 mg by mouth daily.    [provider]  aspirin-acetaminophen-caffeine (EXCEDRIN MIGRAINE) [phone] MG tablet Take by mouth every 6 (six) hours as needed for headache.    [provider]  atorvastatin (LIPITOR) 40 MG tablet Take 20 mg by mouth at bedtime.     [provider]  fluticasone (FLONASE) 50 MCG/ACT nasal spray Place 2 sprays into both nostrils daily. 09/29/18   Swayze, Ava, DO  gabapentin (NEURONTIN) 300 MG capsule Take 2 capsules (600 mg total) by mouth at bedtime. 09/28/18   Swayze, Ava, DO  gabapentin (NEURONTIN) 300 MG capsule Take 1 capsule (300 mg total) by mouth every morning. 09/29/18   Swayze, Ava, DO  glipiZIDE (GLUCOTROL) 10 MG tablet Take 1 tablet (10 mg total) by mouth 2 (two) times daily before a meal. 02/16/18   Rai, Ripudeep K, MD  Glycopyrrolate-Formoterol (BEVESPI AEROSPHERE) 9-4.8 MCG/ACT AERO Inhale 2 puffs into the lungs 2 (two) times daily. 08/10/18   Olalere, Cicero Duck A, MD  guaiFENesin (MUCINEX) 600 MG 12 hr tablet Take 1 tablet (600 mg total) by mouth 2 (two) times daily. 02/16/18   Rai, Vernelle Emerald, MD  metoprolol tartrate (LOPRESSOR) 25 MG tablet Take 0.5 tablets (12.5 mg total) by mouth 2 (two) times daily. 09/28/18   Swayze, Ava, DO  pantoprazole (PROTONIX) 40 MG tablet Take 1 tablet (40 mg total) by mouth 2 (two) times daily. 09/28/18   Swayze, Ava, DO  pioglitazone (ACTOS) 15 MG  tablet Take 1 tablet (15 mg total) by mouth daily. 02/16/18   Rai, Ripudeep K, MD  polyethylene glycol (MIRALAX / GLYCOLAX) 17 g packet Take 17 g by mouth daily as needed for moderate constipation. 09/28/18   Swayze, Ava, DO  sertraline (ZOLOFT) 50 MG tablet Take 50 mg by mouth daily.    [provider]  traMADol (ULTRAM) 50 MG tablet Take 1 tablet (50 mg total) by mouth daily as needed for severe pain (arthritis). 09/28/18   Swayze, Ava, DO  traZODone (DESYREL) 50 MG tablet Take 50 mg by mouth at bedtime. 01/29/18   [provider]     Allergies:    Allergies  Allergen Reactions  . Clindamycin/Lincomycin     Throat tightens up  . Contrast Media [Iodinated Diagnostic Agents] Shortness Of Breath    11/20/2004 reaction with CTA CAP  . Metformin And Related Other (See Comments)    Effected kidney function    Social History:   Social  History   Socioeconomic History  . Marital status: Single    Spouse name: Not on file  . Number of children: 2  . Years of education: Not on file  . Highest education level: Not on file  Occupational History    Comment: retired  Tobacco Use  . Smoking status: Former Smoker    Packs/day: 2.00    Years: 60.00    Pack years: 120.00    Types: Cigarettes    Quit date: 04/2018    Years since quitting: 1.1  . Smokeless tobacco: Never Used  Substance and Sexual Activity  . Alcohol use: No    Comment: heavy drinker in past, none for 20 years  . Drug use: No  . Sexual activity: Not Currently  Other Topics Concern  . Not on file  Social History Narrative  . Not on file   Social Determinants of Health   Financial Resource Strain:   . Difficulty of Paying Living Expenses:   Food Insecurity:   . Worried About Charity fundraiser in the Last Year:   . Arboriculturist in the Last Year:   Transportation Needs:   . Film/video editor (Medical):   Marland Kitchen Lack of Transportation (Non-Medical):   Physical Activity:   . Days of Exercise per  Week:   . Minutes of Exercise per Session:   Stress:   . Feeling of Stress :   Social Connections:   . Frequency of Communication with Friends and Family:   . Frequency of Social Gatherings with Friends and Family:   . Attends Religious Services:   . Active Member of Clubs or Organizations:   . Attends Archivist Meetings:   Marland Kitchen Marital Status:   Intimate Partner Violence:   . Fear of Current or Ex-Partner:   . Emotionally Abused:   Marland Kitchen Physically Abused:   . Sexually Abused:     Family History:   The patient's family history is not on file.    ROS:  Please see the history of present illness.  All other ROS reviewed and negative.     Physical Exam/Data:   Vitals:   05/24/19 1415 05/24/19 1500 05/24/19 1515 05/24/19 1616  BP: (!) 144/79 108/63 (!) 159/68   Pulse: 61 65 60 64  Resp: 14 13 (!) 9 18  Temp:      TempSrc:      SpO2: 92% 95% 93% 92%  Weight:      Height:       No intake or output data in the 24 hours ending 05/24/19 1644 Last 3 Weights 05/24/2019 09/28/2018 09/27/2018  Weight (lbs) 157 lb 10.1 oz 157 lb 11.2 oz 156 lb 4.8 oz  Weight (kg) 71.5 kg 71.532 kg 70.897 kg     Body mass index is 23.28 kg/m.   General: Well developed, well nourished, NAD Neck: Negative for carotid bruits. No JVD Lungs: Diminished bilaterally. Breathing is labored. -->  Coarse rhonchi throughout, cannot exclude rales but also wheezing. Cardiovascular: RRR with S1 S2. + murmur sounds like 1/6 SEM at RUSB. Abdomen: Soft, non-tender, non-distended. No obvious abdominal masses. Extremities: 1-2+ BLE edema. Radial pulses 2+ bilaterally Neuro: Alert and oriented. No focal deficits. No facial asymmetry. MAE spontaneously. ->  Has issues with word recognition -mild aphasia. Psych: Responds to questions appropriately with normal affect.     EKG:  The ECG that was done 05/24/19 was personally reviewed and demonstrates NSR with new TWI in leads II, III, avF,  V3-V4 and no acute ST segment  changes   Relevant CV Studies:  Echo 09/14/2018 1. The left ventricle has normal systolic function, with an ejection fraction of 55-60%. The cavity size was normal. Left ventricular diastolic Doppler parameters are consistent with pseudonormalization. No evidence of left ventricular regional wall  motion abnormalities. 2. Mild calcification of the mitral valve leaflet. There is moderate mitral annular calcification present. No evidence of mitral valve stenosis. No significant mitral regurgitation. 3. The aortic valve is tricuspid. Mild calcification of the aortic valve. No stenosis of the aortic valve. 4. The aortic root is normal in size and structure. 5. The right ventricle has mildly reduced systolic function. The cavity was mildly enlarged. There is no increase in right ventricular wall thickness. Mildly D-shaped interventricular septum suggests a degree of RV pressure/volume overload. 6. The inferior vena cava was dilated in size with >50% respiratory variability. No complete TR doppler jet so unable to estimate PA systolic pressure.  Lexiscan stress 09/23/2018:   There was no ST segment deviation noted during stress.  Defect 1: There is a medium defect of mild severity.  Findings consistent with prior myocardial infarction with peri-infarct ischemia.  This is a low risk study.  Nuclear stress EF: 53%.  The left ventricular ejection fraction is mildly decreased (45-54%).   Low risk stress nuclear study with a mild, mostly fixed moderate-size inferior wall defect (suspect nontransmural infarction with minimal peri-infarct ischemia in the right coronary distribution). Low normal left ventricular global systolic function  Laboratory Data:  High Sensitivity Troponin:   Recent Labs  Lab 05/24/19 1129 05/24/19 1500  TROPONINIHS 1,480* 1,743*      Chemistry Recent Labs  Lab 05/24/19 1129  NA 134*  K 5.5*  CL 105  CO2 19*  GLUCOSE 224*  BUN 41*  CREATININE 1.85*    CALCIUM 9.0  GFRNONAA 36*  GFRAA 42*  ANIONGAP 10    Recent Labs  Lab 05/24/19 1129  PROT 6.1*  ALBUMIN 3.4*  AST 26  ALT 21  ALKPHOS 67  BILITOT 1.1   Hematology Recent Labs  Lab 05/24/19 1129  WBC 5.5  RBC 2.93*  HGB 11.8*  HCT 31.6*  MCV 107.8*  MCH 40.3*  MCHC 37.3*  RDW 17.0*  PLT 122*   BNP Recent Labs  Lab 05/24/19 1129  BNP 1,733.6*    DDimer No results for input(s): DDIMER in the last 168 hours.   Radiology/Studies:  DG Chest Portable 1 View  Result Date: 05/24/2019 CLINICAL DATA:  Shortness of breath, COPD EXAM: PORTABLE CHEST 1 VIEW COMPARISON:  09/20/2018 FINDINGS: Heart size is borderline enlarged, stable. Atherosclerotic calcification of the aortic knob. Mild streaky bibasilar opacities, right greater than left, favor atelectasis. No pleural effusion. No pneumothorax. IMPRESSION: Mild streaky bibasilar opacities, right greater than left, favor atelectasis. Electronically Signed   By: Davina Poke D.O.   On: 05/24/2019 12:22   Assessment and Plan:   1. Acute CHF exacerbation: -Presented with progressive SOB for the last several weeks. Previously he would tolerate walking with minimal complication however more recently states that this has become more difficult and causes worsening fatigue.  -On ED arrival, hsT found to be elevated at 1480 with no complaints of chest pain.  -CXR with mild streaky bibasilar opacities, right greater than left, favoring atelectasis. - BNP elevated at 1733.  -Creatinine elevated at 1.85 with a baseline of 1.1 -Plan for IV Lasix 40mg  BID and follow creatinine closely  -Once adequately diuresed, will need  further ischemic workup given elevated troponin -Prior stress test with moderate defect in the RCA region however with echo confirming no RWMA -Will plan to repeat echocardiogram at this time -Continue metoprolol 12.5 as BP allows.  -If LV systolic dysfunction, will need guideline HF therapy when BP and renal  tolerates.    2. Elevated troponin: -Previously seen by our service in 10/2018 for elevated troponin at which time he underwent Lexiscan 09/2018 that showed mild, mostly fixed moderate-size inferior wall defect (suspect nontransmural infarction with minimal peri-infarct ischemia in the right coronary distribution) and low normal left ventricular global systolic function. -Follow up echocardiogram with LVEF at 55-60% with no RWMA.  -Plan was for follow up, however appears this has not occurred.  -Plan IV Heparin  -Once more stable from a HF standpoint, will need LHC for further ischemic evaluation  3. DM2: -HbA1c, 7.6 -Plan for SSI for glucose control while inpatient status.   4. COPD with prior smoking hx: -Likely has some component of COPD causing symptoms -On PTA inhalers -Would benefit from OP pulmonary    For questions or updates, please contact [place] Please consult [email or web address] for contact info under      Signed, Glenetta Hew, MD  05/24/2019 4:44 PM    ATTENDING ATTESTATION  I have seen, examined and evaluated the patient this PM along with [facility].  After reviewing all the available data and chart, we discussed the patients laboratory, study & physical findings as well as symptoms in detail. I agree with her findings, examination as well as impression recommendations as per our discussion.    Attending adjustments noted in italics.   Very interesting story of a gentleman with a pretty complicated history.  He presents with a indolent history of slow onset worsening dyspnea.  He has significant edema and orthopnea but also has wheezing and rhonchi on exam.  He denies ever having any chest pain or pressure or tachycardia symptoms.  He does have an elevated troponin which is concerning for possible non-STEMI however there is no clinical scenario to suggest symptoms of MI.   At this point, it appears that there is a combination of COPD exacerbation and CHF.   Not sure which one caused which. With elevated creatinine level, would not be able to do any potential ischemic evaluation for now.   However with troponin of 1400, I do think probably cardiac catheterization will be warranted once renal function stabilizes.  He will also need to be a lie flat.  This would likely both be accomplished by IV diuresis.  Agree with plan for IV Lasix and following up echocardiogram. He does probably need inhalers for his wheezing/COPD.  Cardiology will follow along and help determine timing for possible ischemic evaluation.   Glenetta Hew, M.D., M.S. Interventional Cardiologist   Pager # [phone] Phone # [phone] [address] [place], [place] [address]

## 2019-05-24 NOTE — Progress Notes (Signed)
ANTICOAGULATION CONSULT NOTE - Initial Consult  Pharmacy Consult for heparin Indication: chest pain/ACS  Allergies  Allergen Reactions  . Clindamycin/Lincomycin     Throat tightens up  . Contrast Media [Iodinated Diagnostic Agents] Shortness Of Breath    11/20/2004 reaction with CTA CAP  . Metformin And Related Other (See Comments)    Effected kidney function    Patient Measurements: Height: 5\' 9"  (175.3 cm) Weight: 71.5 kg (157 lb 10.1 oz) IBW/kg (Calculated) : 70.7 Heparin Dosing Weight: 71 kg   Vital Signs: Temp: 98.1 F (36.7 C) (05/10 1112) Temp Source: Oral (05/10 1112) BP: 159/68 (05/10 1515) Pulse Rate: 60 (05/10 1515)  Labs: Recent Labs    05/24/19 1129  HGB 11.8*  HCT 31.6*  PLT 122*  CREATININE 1.85*  TROPONINIHS 1,480*    Estimated Creatinine Clearance: 37.2 mL/min (A) (by C-G formula based on SCr of 1.85 mg/dL (H)).   Medical History: Past Medical History:  Diagnosis Date  . Anxiety   . Arthritis    back, right hip  . Chronic kidney disease    stage 3  . COPD (chronic obstructive pulmonary disease) ([place])    on 3L home O2  . Diabetes mellitus without complication ([place])   . Dyspnea   . Hyperlipidemia   . Hypertension   . Peripheral vascular disease ([place])    Carotid artery stenosis, s/p bilat stents  . Skin cancer    basal cell right cheek, left ear  . Stroke [facility])    noted on MRI    Medications:  (Not in a hospital admission)   Assessment: 39 YOM with SOB and elevated troponin concerning for ACS. Pharmacy consulted to start IV heparin with no plans for immediate LHC. H/H and Plt low. SCr 1.85  Goal of Therapy:  Heparin level 0.3-0.7 units/ml Monitor platelets by anticoagulation protocol: Yes   Plan:  -Heparin 4000 units IV bolus followed by IV heparin infusion at 950 units/hr -F/u 8 hr HL -Monitor daily HL, CBC and s/s of bleeding   Albertina Parr, PharmD., BCPS, BCCCP Clinical Pharmacist Clinical phone for 05/24/19 until  11:30pm: [phone] If after 11:30pm, please refer to [facility] for unit-specific pharmacist

## 2019-05-24 NOTE — ED Provider Notes (Signed)
[facility] EMERGENCY DEPARTMENT Provider Note   CSN: [id number] Arrival date & time: 05/24/19  1104     History Chief Complaint  Patient presents with  . Shortness of Breath    Adrian Andrews is a 69 y.o. male with a past medical history of COPD, CKD, DM, stroke, hypertension, heart failure, normally on O2 3 L nasal cannula when he ambulates, who presents today for evaluation of shortness of breath.  He has had worsening shortness of breath over about 1 week.  His shortness of breath increases when he bends over.  He is unable to breathe when lying flat, states that he is needing 3 pillows which is consistent with his baseline.  His legs are not significantly more swollen than usual.  No fevers.  He reports he has had no significant coughing.  He was noted to have his sats drop into the 80s when ambulating today.  He states he normally runs 89 to 91% on 3 L.  He has gotten his Covid vaccines.  He denies any nausea or vomiting.  No pain.   HPI     Past Medical History:  Diagnosis Date  . Anxiety   . Arthritis    back, right hip  . Chronic kidney disease    stage 3  . COPD (chronic obstructive pulmonary disease) ([place])    on 3L home O2  . Diabetes mellitus without complication ([place])   . Dyspnea   . Hyperlipidemia   . Hypertension   . Peripheral vascular disease ([place])    Carotid artery stenosis, s/p bilat stents  . Skin cancer    basal cell right cheek, left ear  . Stroke [facility])    noted on MRI    Patient Active Problem List   Diagnosis Date Noted  . Septic shock ([place])   . Acute on chronic diastolic CHF (congestive heart failure) ([place]) 09/16/2018  . Hyperbilirubinemia 09/16/2018  . RLL pneumonia 09/10/2018  . Severe sepsis ([place]) 09/10/2018  . Essential hypertension 09/10/2018  . Dyslipidemia 09/10/2018  . COPD with acute exacerbation ([place]) 02/14/2018  . Nicotine use disorder 02/14/2018  . CKD (chronic kidney disease) stage 3, GFR 30-59 ml/min  02/14/2018  . COPD (chronic obstructive pulmonary disease) ([place]) 02/13/2018  . Lactic acidosis 02/13/2018  . Type 2 diabetes mellitus with other specified complication ([place]) [id number]  . Acute respiratory failure with hypoxia ([employer]) 12/21/2017  . CAP (community acquired pneumonia) 12/21/2017  . AKI (acute kidney injury) ([place]) 12/21/2017  . COPD exacerbation ([place]) 12/20/2017  . Heart failure ([place]) 12/20/2017  . Carotid artery stenosis, symptomatic, left 02/21/2017    Past Surgical History:  Procedure Laterality Date  . ENDARTERECTOMY Left 02/21/2017   Procedure: LEFT CAROTID ENDARTERECTOMY;  Surgeon: Rosetta Posner, MD;  Location: [facility] OR;  Service: Vascular;  Laterality: Left;  . lower extremity stenting    . PATCH ANGIOPLASTY Left 02/21/2017   Procedure: PATCH ANGIOPLASTY OF LEFT CAROTID ARTERY USING HEMASHIELD PLATINUM FINESSE PATCH;  Surgeon: Rosetta Posner, MD;  Location: [place];  Service: Vascular;  Laterality: Left;       No family history on file.  Social History   Tobacco Use  . Smoking status: Former Smoker    Packs/day: 2.00    Years: 60.00    Pack years: 120.00    Types: Cigarettes    Quit date: 04/2018    Years since quitting: 1.1  . Smokeless tobacco: Never Used  Substance Use Topics  . Alcohol use:  No    Comment: heavy drinker in past, none for 20 years  . Drug use: No    Home Medications Prior to Admission medications   Medication Sig Start Date End Date Taking? Authorizing Provider  acetaminophen (TYLENOL) 325 MG tablet Take 2 tablets (650 mg total) by mouth every 6 (six) hours as needed for mild pain or headache. 09/28/18  Yes Swayze, Ava, DO  albuterol (VENTOLIN HFA) 108 (90 Base) MCG/ACT inhaler Inhale 2 puffs into the lungs every 6 (six) hours as needed for wheezing or shortness of breath. 08/10/18  Yes Olalere, Adewale A, MD  aspirin EC 81 MG tablet Take 81 mg by mouth daily.   Yes [provider]  atenolol (TENORMIN) 50 MG tablet Take 50 mg by mouth  daily.   Yes [provider]  atorvastatin (LIPITOR) 40 MG tablet Take 20 mg by mouth at bedtime.    Yes [provider]  budesonide-formoterol (SYMBICORT) 80-4.5 MCG/ACT inhaler Inhale 1 puff into the lungs 2 (two) times daily.   Yes [provider]  calcium carbonate (TUMS EX) 750 MG chewable tablet Chew 1 tablet by mouth daily as needed (For indegestion or nausea).   Yes [provider]  furosemide (LASIX) 20 MG tablet Take 20 mg by mouth daily as needed for edema.   Yes [provider]  gabapentin (NEURONTIN) 300 MG capsule Take 1 capsule (300 mg total) by mouth every morning. 09/29/18  Yes Swayze, Ava, DO  glipiZIDE (GLUCOTROL) 10 MG tablet Take 1 tablet (10 mg total) by mouth 2 (two) times daily before a meal. 02/16/18  Yes Rai, Ripudeep K, MD  Glycopyrrolate-Formoterol (BEVESPI AEROSPHERE) 9-4.8 MCG/ACT AERO Inhale 2 puffs into the lungs 2 (two) times daily. 08/10/18  Yes Olalere, Adewale A, MD  HYDROcodone-acetaminophen (NORCO/VICODIN) 5-325 MG tablet Take 1 tablet by mouth 2 (two) times daily as needed for moderate pain.   Yes [provider]  pioglitazone (ACTOS) 30 MG tablet Take 30 mg by mouth daily.   Yes [provider]  sertraline (ZOLOFT) 100 MG tablet Take 100 mg by mouth daily.   Yes [provider]  tamsulosin (FLOMAX) 0.4 MG CAPS capsule Take 0.4 mg by mouth at bedtime.   Yes [provider]  traMADol (ULTRAM) 50 MG tablet Take 1 tablet (50 mg total) by mouth daily as needed for severe pain (arthritis). Patient taking differently: Take 50 mg by mouth every 4 (four) hours as needed for moderate pain.  09/28/18  Yes Swayze, Ava, DO  traZODone (DESYREL) 50 MG tablet Take 50 mg by mouth at bedtime. 01/29/18  Yes [provider]  fluticasone (FLONASE) 50 MCG/ACT nasal spray Place 2 sprays into both nostrils daily. Patient not taking: Reported on 05/24/2019 09/29/18   Swayze, Ava, DO  gabapentin  (NEURONTIN) 300 MG capsule Take 2 capsules (600 mg total) by mouth at bedtime. Patient not taking: Reported on 05/24/2019 09/28/18   Swayze, Ava, DO  guaiFENesin (MUCINEX) 600 MG 12 hr tablet Take 1 tablet (600 mg total) by mouth 2 (two) times daily. Patient not taking: Reported on 05/24/2019 02/16/18   Rai, Vernelle Emerald, MD  metoprolol tartrate (LOPRESSOR) 25 MG tablet Take 0.5 tablets (12.5 mg total) by mouth 2 (two) times daily. Patient not taking: Reported on 05/24/2019 09/28/18   Swayze, Ava, DO  pantoprazole (PROTONIX) 40 MG tablet Take 1 tablet (40 mg total) by mouth 2 (two) times daily. Patient not taking: Reported on 05/24/2019 09/28/18   Swayze, Ava, DO  pioglitazone (ACTOS) 15 MG tablet Take 1 tablet (15 mg total) by mouth daily. Patient not taking: Reported on 05/24/2019 02/16/18   Rai, Vernelle Emerald, MD  polyethylene glycol (MIRALAX / GLYCOLAX) 17 g packet Take 17 g by mouth daily as needed for moderate constipation. Patient not taking: Reported on 05/24/2019 09/28/18   Swayze, Ava, DO    Allergies    Clindamycin/lincomycin, Contrast media [iodinated diagnostic agents], and Metformin and related  Review of Systems   Review of Systems  Constitutional: Negative for chills, fever and unexpected weight change.  HENT: Negative for congestion.   Eyes: Negative for visual disturbance.  Respiratory: Positive for shortness of breath. Negative for cough, chest tightness and wheezing.   Cardiovascular: Positive for leg swelling (Chronic, unchanged). Negative for chest pain and palpitations.  Gastrointestinal: Negative for abdominal pain, nausea and vomiting.  Genitourinary: Negative for dysuria.  Neurological: Negative for weakness and headaches.  Psychiatric/Behavioral: Negative for confusion.  All other systems reviewed and are negative.   Physical Exam Updated Vital Signs BP (!) 159/68   Pulse 64   Temp 98.1 F (36.7 C) (Oral)   Resp 18   Ht 5\' 9"  (1.753 m)   Wt 71.5 kg   SpO2 92%   BMI  23.28 kg/m   Physical Exam Vitals and nursing note reviewed.  Constitutional:      General: He is not in acute distress.    Appearance: He is well-developed. He is not ill-appearing or diaphoretic.  HENT:     Head: Normocephalic and atraumatic.     Mouth/Throat:     Mouth: Mucous membranes are moist.  Eyes:     General: No scleral icterus.       Right eye: No discharge.        Left eye: No discharge.     Conjunctiva/sclera: Conjunctivae normal.  Cardiovascular:     Rate and Rhythm: Normal rate and regular rhythm.  Pulmonary:     Effort: Pulmonary effort is normal. No tachypnea, accessory muscle usage or respiratory distress.     Breath sounds: No stridor. Rhonchi (Posterior, left >r) and rales (Anterior, bilaterally, more is lower lobes) present.  Chest:     Chest wall: No mass or tenderness.  Abdominal:     General: There is no distension.     Palpations: Abdomen is soft.  Musculoskeletal:        General: No deformity.     Cervical back: Normal range of motion and neck supple.     Right lower leg: No tenderness. Edema present.     Left lower leg: No tenderness. Edema present.     Comments: 2+ pitting edema bilaterally.   Skin:    General: Skin is warm and dry.  Neurological:     General: No focal deficit present.     Mental Status: He is alert.     Motor: No abnormal muscle tone.  Psychiatric:        Behavior: Behavior normal.     ED Results / Procedures / Treatments   Labs (all labs ordered are listed, but only abnormal results are displayed) Labs Reviewed  COMPREHENSIVE METABOLIC PANEL - Abnormal; Notable for the following components:      Result Value   Sodium 134 (*)    Potassium 5.5 (*)    CO2 19 (*)    Glucose, Bld 224 (*)    BUN 41 (*)    Creatinine, Ser 1.85 (*)    Total Protein 6.1 (*)  Albumin 3.4 (*)    GFR calc non Af Amer 36 (*)    GFR calc Af Amer 42 (*)    All other components within normal limits  CBC WITH DIFFERENTIAL/PLATELET -  Abnormal; Notable for the following components:   RBC 2.93 (*)    Hemoglobin 11.8 (*)    HCT 31.6 (*)    MCV 107.8 (*)    MCH 40.3 (*)    MCHC 37.3 (*)    RDW 17.0 (*)    Platelets 122 (*)    All other components within normal limits  BRAIN NATRIURETIC PEPTIDE - Abnormal; Notable for the following components:   B Natriuretic Peptide 1,733.6 (*)    All other components within normal limits  TROPONIN I (HIGH SENSITIVITY) - Abnormal; Notable for the following components:   Troponin I (High Sensitivity) 1,480 (*)    All other components within normal limits  TROPONIN I (HIGH SENSITIVITY) - Abnormal; Notable for the following components:   Troponin I (High Sensitivity) 1,743 (*)    All other components within normal limits  SARS CORONAVIRUS 2 BY RT PCR (HOSPITAL ORDER, [facility])  HEPARIN LEVEL (UNFRACTIONATED)    EKG None  Radiology DG Chest Portable 1 View  Result Date: 05/24/2019 CLINICAL DATA:  Shortness of breath, COPD EXAM: PORTABLE CHEST 1 VIEW COMPARISON:  09/20/2018 FINDINGS: Heart size is borderline enlarged, stable. Atherosclerotic calcification of the aortic knob. Mild streaky bibasilar opacities, right greater than left, favor atelectasis. No pleural effusion. No pneumothorax. IMPRESSION: Mild streaky bibasilar opacities, right greater than left, favor atelectasis. Electronically Signed   By: Davina Poke D.O.   On: 05/24/2019 12:22    Procedures .Critical Care Performed by: Lorin Glass, PA-C Authorized by: Lorin Glass, PA-C   Critical care provider statement:    Critical care time (minutes):  45   Critical care was time spent personally by me on the following activities:  Discussions with consultants, evaluation of patient's response to treatment, examination of patient, ordering and performing treatments and interventions, ordering and review of laboratory studies, ordering and review of radiographic studies, pulse  oximetry, re-evaluation of patient's condition, obtaining history from patient or surrogate and review of old charts Comments:     Elevated troponin.    (including critical care time)  Medications Ordered in ED Medications  furosemide (LASIX) injection 40 mg (has no administration in time range)  metoprolol tartrate (LOPRESSOR) tablet 12.5 mg (has no administration in time range)  pantoprazole (PROTONIX) EC tablet 40 mg (has no administration in time range)  aspirin EC tablet 81 mg (has no administration in time range)  atorvastatin (LIPITOR) tablet 20 mg (has no administration in time range)  heparin ADULT infusion 100 units/mL (25000 units/231mL sodium chloride 0.45%) (950 Units/hr Intravenous New Bag/Given 05/24/19 1638)  heparin bolus via infusion 4,000 Units (4,000 Units Intravenous Bolus from Bag 05/24/19 1638)    ED Course  I have reviewed the triage vital signs and the nursing notes.  Pertinent labs & imaging results that were available during my care of the patient were reviewed by me and considered in my medical decision making (see chart for details).  Clinical Course as of May 24 1647  Mon May 24, 2019  1254 Significantly higher than his normal    Troponin I (High Sensitivity)(!!): 1,480 [EH]  1306 Spoke with trish of cards, they will come see patient.    [EH]  1543 I spoke with Dr. Roosevelt Locks who will seee  the patient for admission.    [EH]    Clinical Course User Index [EH] Ollen Gross   MDM Rules/Calculators/A&P                     Patient is a 69 year old gentleman who presents today for evaluation of shortness of breath.  He normally is on 3 L and runs 89 to 91%.  He has had worsening shortness of breath.  On exam he is on his usual 3 L/min and satting in the low 90s.  He is afebrile, not tachycardic or tachypneic.  Mild intermittent hypertension.  EKG without evidence of acute ischemia.  Opponent is significantly elevated at 1480.  I spoke with  cardiology who came to see the patient, request medical admission.  Additional labs show no significant leukocytosis.  Hemoglobin is 11.8 which is improved from his baseline.  Mild thrombocytopenia at 122.  CMP shows a turn for an AKI with his creatinine elevated at 1.85.  In addition he is mildly hyperkalemic at 5.5.  Covid test is negative.  BNP is elevated at 1700.  While patient does have COPD clinically his symptoms appear more consistent with CHF.  Chest x-ray shows streaky opacities bilaterally.  Patient will be admitted in the hospital.  I spoke with Dr. Roosevelt Locks who will see the patient for admission.  Note: Portions of this report may have been transcribed using voice recognition software. Every effort was made to ensure accuracy; however, inadvertent computerized transcription errors may be present   Final Clinical Impression(s) / ED Diagnoses Final diagnoses:  SOB (shortness of breath)  Elevated troponin    Rx / DC Orders ED Discharge Orders    None       Ollen Gross 05/24/19 1651    Dorie Rank, MD 05/27/19 1028

## 2019-05-24 NOTE — ED Notes (Signed)
Pt transported for renal US

## 2019-05-24 NOTE — ED Notes (Signed)
Tele on Heparin gtt [place] ordered

## 2019-05-25 ENCOUNTER — Inpatient Hospital Stay (HOSPITAL_COMMUNITY): Payer: Medicare Other

## 2019-05-25 DIAGNOSIS — I5021 Acute systolic (congestive) heart failure: Secondary | ICD-10-CM

## 2019-05-25 DIAGNOSIS — E1169 Type 2 diabetes mellitus with other specified complication: Secondary | ICD-10-CM

## 2019-05-25 DIAGNOSIS — N1831 Chronic kidney disease, stage 3a: Secondary | ICD-10-CM

## 2019-05-25 DIAGNOSIS — J9601 Acute respiratory failure with hypoxia: Secondary | ICD-10-CM

## 2019-05-25 DIAGNOSIS — N183 Chronic kidney disease, stage 3 unspecified: Secondary | ICD-10-CM

## 2019-05-25 DIAGNOSIS — I10 Essential (primary) hypertension: Secondary | ICD-10-CM

## 2019-05-25 LAB — CBC
HCT: 31.6 % — ABNORMAL LOW (ref 39.0–52.0)
Hemoglobin: 10.6 g/dL — ABNORMAL LOW (ref 13.0–17.0)
MCH: 34.8 pg — ABNORMAL HIGH (ref 26.0–34.0)
MCHC: 33.5 g/dL (ref 30.0–36.0)
MCV: 103.6 fL — ABNORMAL HIGH (ref 80.0–100.0)
Platelets: UNDETERMINED 10*3/uL (ref 150–400)
RBC: 3.05 MIL/uL — ABNORMAL LOW (ref 4.22–5.81)
RDW: 14.9 % (ref 11.5–15.5)
WBC: 3.1 10*3/uL — ABNORMAL LOW (ref 4.0–10.5)
nRBC: 0 % (ref 0.0–0.2)

## 2019-05-25 LAB — GLUCOSE, CAPILLARY
Glucose-Capillary: 124 mg/dL — ABNORMAL HIGH (ref 70–99)
Glucose-Capillary: 161 mg/dL — ABNORMAL HIGH (ref 70–99)

## 2019-05-25 LAB — BASIC METABOLIC PANEL
Anion gap: 13 (ref 5–15)
BUN: 41 mg/dL — ABNORMAL HIGH (ref 8–23)
CO2: 19 mmol/L — ABNORMAL LOW (ref 22–32)
Calcium: 8.8 mg/dL — ABNORMAL LOW (ref 8.9–10.3)
Chloride: 101 mmol/L (ref 98–111)
Creatinine, Ser: 1.55 mg/dL — ABNORMAL HIGH (ref 0.61–1.24)
GFR calc Af Amer: 52 mL/min — ABNORMAL LOW (ref >60)
GFR calc non Af Amer: 45 mL/min — ABNORMAL LOW (ref >60)
Glucose, Bld: 283 mg/dL — ABNORMAL HIGH (ref 70–99)
Potassium: 5 mmol/L (ref 3.5–5.1)
Sodium: 133 mmol/L — ABNORMAL LOW (ref 135–145)

## 2019-05-25 LAB — HEPARIN LEVEL (UNFRACTIONATED)
Heparin Unfractionated: 1.56 IU/mL — ABNORMAL HIGH (ref 0.30–0.70)
Heparin Unfractionated: 0.12 IU/mL — ABNORMAL LOW (ref 0.30–0.70)
Heparin Unfractionated: 0.25 IU/mL — ABNORMAL LOW (ref 0.30–0.70)

## 2019-05-25 LAB — ECHOCARDIOGRAM COMPLETE
Height: 69 in
Weight: 2522.06 oz

## 2019-05-25 LAB — CBG MONITORING, ED
Glucose-Capillary: 242 mg/dL — ABNORMAL HIGH (ref 70–99)
Glucose-Capillary: 167 mg/dL — ABNORMAL HIGH (ref 70–99)

## 2019-05-25 LAB — HIV ANTIBODY (ROUTINE TESTING W REFLEX): HIV Screen 4th Generation wRfx: NONREACTIVE

## 2019-05-25 LAB — TROPONIN I (HIGH SENSITIVITY): Troponin I (High Sensitivity): 668 ng/L (ref <18)

## 2019-05-25 MED ORDER — HEPARIN BOLUS VIA INFUSION
2000.0000 [IU] | Freq: Once | INTRAVENOUS | Status: AC
  Administered 2019-05-25: 2000 [IU] via INTRAVENOUS
  Filled 2019-05-25: qty 2000

## 2019-05-25 MED ORDER — SIMETHICONE 80 MG PO CHEW
80.0000 mg | CHEWABLE_TABLET | Freq: Once | ORAL | Status: AC
  Administered 2019-05-25: 80 mg via ORAL
  Filled 2019-05-25: qty 1

## 2019-05-25 MED ORDER — MELATONIN 3 MG PO TABS
6.0000 mg | ORAL_TABLET | Freq: Every day | ORAL | Status: DC
  Administered 2019-05-25 – 2019-05-30 (×7): 6 mg via ORAL
  Filled 2019-05-25 (×8): qty 2

## 2019-05-25 MED ORDER — INSULIN DETEMIR 100 UNIT/ML ~~LOC~~ SOLN
5.0000 [IU] | Freq: Two times a day (BID) | SUBCUTANEOUS | Status: DC
  Administered 2019-05-25 – 2019-05-31 (×13): 5 [IU] via SUBCUTANEOUS
  Filled 2019-05-25 (×14): qty 0.05

## 2019-05-25 MED ORDER — SIMETHICONE 80 MG PO CHEW
80.0000 mg | CHEWABLE_TABLET | Freq: Four times a day (QID) | ORAL | Status: DC | PRN
  Administered 2019-05-26 – 2019-05-28 (×2): 80 mg via ORAL
  Filled 2019-05-25 (×2): qty 1

## 2019-05-25 MED ORDER — HEPARIN BOLUS VIA INFUSION
2000.0000 [IU] | Freq: Once | INTRAVENOUS | Status: DC
  Filled 2019-05-25: qty 2000

## 2019-05-25 MED ORDER — CLOTRIMAZOLE 1 % EX CREA
1.0000 "application " | TOPICAL_CREAM | Freq: Two times a day (BID) | CUTANEOUS | Status: DC
  Administered 2019-05-25 – 2019-05-31 (×11): 1 via TOPICAL
  Filled 2019-05-25: qty 15

## 2019-05-25 MED ORDER — ASPIRIN EC 325 MG PO TBEC
325.0000 mg | DELAYED_RELEASE_TABLET | Freq: Every day | ORAL | Status: DC
  Administered 2019-05-25 – 2019-05-26 (×2): 325 mg via ORAL
  Filled 2019-05-25 (×2): qty 1

## 2019-05-25 MED ORDER — METOPROLOL TARTRATE 25 MG PO TABS
25.0000 mg | ORAL_TABLET | Freq: Two times a day (BID) | ORAL | Status: DC
  Administered 2019-05-25 – 2019-05-30 (×10): 25 mg via ORAL
  Filled 2019-05-25 (×11): qty 1

## 2019-05-25 MED ORDER — HYDROCODONE-ACETAMINOPHEN 5-325 MG PO TABS
1.0000 | ORAL_TABLET | Freq: Once | ORAL | Status: AC
  Administered 2019-05-25: 11:00:00 1 via ORAL
  Filled 2019-05-25: qty 1

## 2019-05-25 NOTE — ED Notes (Signed)
PT arrived at bedside.

## 2019-05-25 NOTE — ED Notes (Signed)
Returned call to [place] at [facility] to provide update. Pt approved and aware.

## 2019-05-25 NOTE — Evaluation (Signed)
Physical Therapy Evaluation Patient Details Name: Adrian Andrews MRN: [id number] DOB: December 22, 1950 Today's Date: 05/25/2019   History of Present Illness  69 y.o. male with PMH of HTN, HLD, carotid artery disease s/p L CEA, COPD, CVA, DM II and former tobacco use who presented dyspnea and elevated troponin. He was previously admitted in Sep 2020 with RLL PNA and elevated trop. Myoview at the time was low risk however showed moderate sized inferior wall defect suspected to be infarct with minimal ischemia  Clinical Impression  Pt admitted with above diagnosis. Pt was able to stand up with min guard assist with good balance standing and able to wash himself up some.  Does desat with marching in place and with performing hygeine in standing to 86% on 3L.  Needs frequent rest breaks. Should be able to return to his I living apartment once medical issues resolved. Pt does state he has no portable tank and would benefit from gettingone.   Pt currently with functional limitations due to the deficits listed below (see PT Problem List). Pt will benefit from skilled PT to increase their independence and safety with mobility to allow discharge to the venue listed below.      Follow Up Recommendations Home health PT;Supervision - Intermittent    Equipment Recommendations  None recommended by PT , portable O2 tank   Recommendations for Other Services       Precautions / Restrictions Precautions Precautions: Fall Restrictions Weight Bearing Restrictions: No      Mobility  Bed Mobility Overal bed mobility: Independent                Transfers Overall transfer level: Needs assistance Equipment used: None Transfers: Sit to/from Stand Sit to Stand: Min guard         General transfer comment: No assist needed, guard provided for safety off stretcher. Pt marched in place initially and desat to 85% on 3L needing a rest to incr sats >90%.  Washed pts back and pt washed his face and perineal  area with min guard assist and was able to wash his perneal area without steadying assist.  Pt desat again and needed to rest.  Did not ambulate as pt desats with minimal activity at EOB.   Ambulation/Gait             General Gait Details: TBA  Stairs            Wheelchair Mobility    Modified Rankin (Stroke Patients Only)       Balance Overall balance assessment: Needs assistance Sitting-balance support: No upper extremity supported;Feet supported Sitting balance-Leahy Scale: Fair     Standing balance support: No upper extremity supported;During functional activity Standing balance-Leahy Scale: Fair Standing balance comment: static and dynamic activity with min guard assist                             Pertinent Vitals/Pain Pain Assessment: 0-10 Pain Score: 7  Pain Location: back Pain Descriptors / Indicators: Aching;Grimacing;Guarding Pain Intervention(s): Limited activity within patient's tolerance;Monitored during session;Patient requesting pain meds-RN notified;Repositioned    Home Living Family/patient expects to be discharged to:: Private residence(I living) Living Arrangements: Alone Available Help at Discharge: Available PRN/intermittently Type of Home: Independent living facility([facility]) Home Access: Level entry     Home Layout: One level Home Equipment: Grab bars - toilet;Grab bars - tub/shower;Hand held shower head;Shower seat - built in(3LO2 at home)  Prior Function Level of Independence: Independent         Comments: did not use device, facility gives pt meds and provides meals, shuttle for appts per facility, pt bahted and dressed self     Hand Dominance   Dominant Hand: Right    Extremity/Trunk Assessment   Upper Extremity Assessment Upper Extremity Assessment: Defer to OT evaluation    Lower Extremity Assessment Lower Extremity Assessment: Generalized weakness    Cervical / Trunk Assessment Cervical  / Trunk Assessment: Normal  Communication   Communication: No difficulties  Cognition Arousal/Alertness: Awake/alert Behavior During Therapy: WFL for tasks assessed/performed Overall Cognitive Status: Within Functional Limits for tasks assessed                                        General Comments General comments (skin integrity, edema, etc.): 70 bpm, 93% on 3L, 15, 129/64 pre BP and 128/66 post BP.  O2 sats to 86% on 3L with activity.     Exercises General Exercises - Lower Extremity Hip Flexion/Marching: AROM;Both;15 reps;Standing   Assessment/Plan    PT Assessment Patient needs continued PT services  PT Problem List Decreased balance;Decreased mobility;Decreased activity tolerance;Decreased knowledge of precautions;Decreased safety awareness;Decreased knowledge of use of DME;Cardiopulmonary status limiting activity;Pain       PT Treatment Interventions DME instruction;Gait training;Functional mobility training;Therapeutic activities;Therapeutic exercise;Balance training;Patient/family education    PT Goals (Current goals can be found in the Care Plan section)  Acute Rehab PT Goals Patient Stated Goal: to go home PT Goal Formulation: With patient Time For Goal Achievement: 06/08/19 Potential to Achieve Goals: Good    Frequency Min 3X/week   Barriers to discharge        Co-evaluation               AM-PAC PT "6 Clicks" Mobility  Outcome Measure Help needed turning from your back to your side while in a flat bed without using bedrails?: None Help needed moving from lying on your back to sitting on the side of a flat bed without using bedrails?: None Help needed moving to and from a bed to a chair (including a wheelchair)?: None Help needed standing up from a chair using your arms (e.g., wheelchair or bedside chair)?: None Help needed to walk in hospital room?: A Little Help needed climbing 3-5 steps with a railing? : A Little 6 Click Score:  22    End of Session Equipment Utilized During Treatment: Gait belt;Oxygen Activity Tolerance: Patient limited by fatigue Patient left: in bed;with call bell/phone within reach Nurse Communication: Mobility status;Patient requests pain meds PT Visit Diagnosis: Unsteadiness on feet (R26.81);Muscle weakness (generalized) (M62.81)    Time: [id number] PT Time Calculation (min) (ACUTE ONLY): 26 min   Charges:   PT Evaluation $PT Eval Moderate Complexity: 1 Mod PT Treatments $Therapeutic Activity: 8-22 mins        [place] Krotz W,PT Acute Rehabilitation Services Pager:  [phone]  Office:  [facility] 05/25/2019, 11:17 AM

## 2019-05-25 NOTE — Progress Notes (Signed)
[facility] PROGRESS NOTE    Progress Note  Adrian Andrews  [id number] DOB: April 11, 1950 DOA: 05/24/2019 PCP: [name], MD     Brief Narrative:   Adrian Andrews is an 69 y.o. male past medical history significant of chronic respiratory failure secondary COPD oxygen dependent, insulin-dependent diabetes mellitus, status post left carotid endarterectomy, history of CVA essential hypertension comes in for 4 days of dyspnea.he relates some productive cough denies any fever or chills.  In the ED was found to be hypoxic potassium of 5 bicarbonate 19 creatinine 1.8 d  Assessment/Plan:   Acute on chronic respiratory failure with hypoxia due to acute probably systolic heart failure: Prior stress test showing RC defect region, 2D echo during that time showed no wall motion abnormality. On arrival T waves were inverted, cardiac biomarkers were 1400 he did not complain of chest pain, chest x-ray shows treat by bilateral opacities and a BNP of 1700, his creatinine is elevated to 1.8 from 1.1. Cardiology was consulted will start on IV Lasix and they recommended ischemic work-up. We will start her on IV heparin, 2D echo was ordered current aspirin, continue beta-blocker. Discontinue steroids he is not having a COPD exacerbation he appears fluid overloaded on physical exam continue aggressive IV diuresis.  Acute kidney injury on chronic kidney disease stage II: Clinically he appears fluid overloaded, suspect cardiorenal syndrome in the setting of ischemic event. His baseline creatinine is around 1 on admission 1.8, improving with diuresis.  Mild hypervolemic hyponatremia: Continue IV diuresis, recheck basic metabolic panel in the morning.  Hyperkalemia Likely due to hypovolemia improving with diuresis recheck in the morning.  Type 2 diabetes mellitus with other specified complication (HCC) Hemoglobin A1c of 8.5, agree with stopping Actos. On steroids, will discontinue, continue  long-acting insulin plus sliding scale.  Essential hypertension: Blood pressure seems to be well controlled continue metoprololat 25 mg of Lasix IV twice daily.   DVT prophylaxis: lovenox Family Communication:none Status is: Inpatient  Remains inpatient appropriate because:Hemodynamically unstable   Dispo: The patient is from: Home              Anticipated d/c is to: Home              Anticipated d/c date is: > 3 days              Patient currently is medically stable to d/c.  Code Status:     Code Status Orders  (From admission, onward)         Start     Ordered   05/24/19 1656  Do not attempt resuscitation (DNR)  Continuous    Question Answer Comment  In the event of cardiac or respiratory ARREST Do not call a "code blue"   In the event of cardiac or respiratory ARREST Do not perform Intubation, CPR, defibrillation or ACLS   In the event of cardiac or respiratory ARREST Use medication by any route, position, wound care, and other measures to relive pain and suffering. May use oxygen, suction and manual treatment of airway obstruction as needed for comfort.      05/24/19 1657        Code Status History    Date Active Date Inactive Code Status Order ID Comments User Context   09/10/2018 1200 09/28/2018 1752 DNR [id number]  Karmen Bongo, MD ED   02/13/2018 1906 02/16/2018 1645 Full Code [id number]  Elmarie Shiley, MD ED   12/20/2017 1608 12/23/2017 1737 Full Code [id number]  Arrien, Mauricio  Quillian Quince, MD Inpatient   02/21/2017 1701 02/22/2017 1449 Full Code [id number]  Gabriel Earing, PA-C Inpatient   Advance Care Planning Activity    Advance Directive Documentation     Most Recent Value  Type of Advance Directive  Living will  Pre-existing out of facility DNR order (yellow form or pink MOST form)  --  "MOST" Form in Place?  --        IV Access:    Peripheral IV   Procedures and diagnostic studies:   US RENAL  Result Date: 05/24/2019 CLINICAL DATA:  Acute  kidney injury EXAM: RENAL / URINARY TRACT ULTRASOUND COMPLETE COMPARISON:  None. FINDINGS: Right Kidney: Renal measurements: 9.4 x 4.6 x 4.7 cm = volume: 106.4 mL. Cortical echogenicity is normal. No hydronephrosis. Small exophytic cyst at the midpole measuring 1.2 cm. Left Kidney: Renal measurements: 8.7 x 5 x 5.2 cm = volume: 119.8 mL. Echogenicity within normal limits. No mass or hydronephrosis visualized. Bladder: Appears slightly thick walled Other: None. IMPRESSION: 1. Negative for hydronephrosis.  Small cyst in the right kidney 2. Slightly thick-walled appearance of the bladder which is nonspecific, consider cystitis or potential chronic obstruction. Electronically Signed   By: Donavan Foil M.D.   On: 05/24/2019 20:34   DG Chest Portable 1 View  Result Date: 05/24/2019 CLINICAL DATA:  Shortness of breath, COPD EXAM: PORTABLE CHEST 1 VIEW COMPARISON:  09/20/2018 FINDINGS: Heart size is borderline enlarged, stable. Atherosclerotic calcification of the aortic knob. Mild streaky bibasilar opacities, right greater than left, favor atelectasis. No pleural effusion. No pneumothorax. IMPRESSION: Mild streaky bibasilar opacities, right greater than left, favor atelectasis. Electronically Signed   By: Davina Poke D.O.   On: 05/24/2019 12:22     Medical Consultants:    None.  Anti-Infectives:   None  Subjective:    Adrian Andrews he denies any chest pain, he relates he continues to be short of breath.  Objective:    Vitals:   05/25/19 0100 05/25/19 0331 05/25/19 0430 05/25/19 0600  BP: 128/73 137/73 122/60 121/73  Pulse:  77 71 68  Resp: 13 17 16  (!) 21  Temp:      TempSrc:      SpO2:  (!) 89% 96% 98%  Weight:      Height:       SpO2: 98 % O2 Flow Rate (L/min): 3 L/min  No intake or output data in the 24 hours ending 05/25/19 0713 Filed Weights   05/24/19 1116  Weight: 71.5 kg    Exam: General exam: In no acute distress. Respiratory system: Good air movement and  crackles at bases bilaterally. Cardiovascular system: S1 & S2 heard, RRR.  Positive JVD Gastrointestinal system: Abdomen is nondistended, soft and nontender.  Central nervous system: Alert and oriented. No focal neurological deficits. Extremities: 2+ asymmetrical edema on the left and on the right Skin: No rashes, lesions or ulcers Psychiatry: Judgment and insight appear normal, mood and affect seem appropriate.   Data Reviewed:    Labs: Basic Metabolic Panel: Recent Labs  Lab 05/24/19 1129 05/24/19 1129 05/24/19 2246 05/25/19 0516  NA 134*  --  132* 133*  K 5.5*   < > 5.5* 5.0  CL 105  --   --  101  CO2 19*  --   --  19*  GLUCOSE 224*  --   --  283*  BUN 41*  --   --  41*  CREATININE 1.85*  --   --  1.55*  CALCIUM  9.0  --   --  8.8*   < > = values in this interval not displayed.   GFR Estimated Creatinine Clearance: 44.3 mL/min (A) (by C-G formula based on SCr of 1.55 mg/dL (H)). Liver Function Tests: Recent Labs  Lab 05/24/19 1129  AST 26  ALT 21  ALKPHOS 67  BILITOT 1.1  PROT 6.1*  ALBUMIN 3.4*   No results for input(s): LIPASE, AMYLASE in the last 168 hours. No results for input(s): AMMONIA in the last 168 hours. Coagulation profile No results for input(s): INR, PROTIME in the last 168 hours. COVID-19 Labs  No results for input(s): DDIMER, FERRITIN, LDH, CRP in the last 72 hours.  Lab Results  Component Value Date   SARSCOV2NAA NEGATIVE 05/24/2019   SARSCOV2NAA NEGATIVE 09/28/2018   [place] NEGATIVE 09/13/2018   [place] NEGATIVE 09/10/2018    CBC: Recent Labs  Lab 05/24/19 1129 05/24/19 2246  WBC 5.5  --   NEUTROABS 4.3  --   HGB 11.8* 11.9*  HCT 31.6* 35.0*  MCV 107.8*  --   PLT 122*  --    Cardiac Enzymes: Recent Labs  Lab 05/24/19 1944  CKTOTAL 193   BNP (last 3 results) No results for input(s): PROBNP in the last 8760 hours. CBG: Recent Labs  Lab 05/24/19 1923 05/24/19 2304  GLUCAP 180* 216*   D-Dimer: No results  for input(s): DDIMER in the last 72 hours. Hgb A1c: Recent Labs    05/24/19 2200  HGBA1C 8.5*   Lipid Profile: No results for input(s): CHOL, HDL, LDLCALC, TRIG, CHOLHDL, LDLDIRECT in the last 72 hours. Thyroid function studies: No results for input(s): TSH, T4TOTAL, T3FREE, THYROIDAB in the last 72 hours.  Invalid input(s): FREET3 Anemia work up: No results for input(s): VITAMINB12, FOLATE, FERRITIN, TIBC, IRON, RETICCTPCT in the last 72 hours. Sepsis Labs: Recent Labs  Lab 05/24/19 1129  WBC 5.5   Microbiology Recent Results (from the past 240 hour(s))  SARS Coronavirus 2 by RT PCR (hospital order, performed in [facility] hospital lab) Nasopharyngeal Nasopharyngeal Swab     Status: None   Collection Time: 05/24/19 11:29 AM   Specimen: Nasopharyngeal Swab  Result Value Ref Range Status   SARS Coronavirus 2 NEGATIVE NEGATIVE Final    Comment: (NOTE) SARS-CoV-2 target nucleic acids are NOT DETECTED. The SARS-CoV-2 RNA is generally detectable in upper and lower respiratory specimens during the acute phase of infection. The lowest concentration of SARS-CoV-2 viral copies this assay can detect is 250 copies / mL. A negative result does not preclude SARS-CoV-2 infection and should not be used as the sole basis for treatment or other patient management decisions.  A negative result may occur with improper specimen collection / handling, submission of specimen other than nasopharyngeal swab, presence of viral mutation(s) within the areas targeted by this assay, and inadequate number of viral copies (<250 copies / mL). A negative result must be combined with clinical observations, patient history, and epidemiological information. Fact Sheet for Patients:   StrictlyIdeas.no Fact Sheet for Healthcare Providers: BankingDealers.co.za This test is not yet approved or cleared  by the [place] FDA and has been authorized for detection  and/or diagnosis of SARS-CoV-2 by FDA under an Emergency Use Authorization (EUA).  This EUA will remain in effect (meaning this test can be used) for the duration of the COVID-19 declaration under Section 564(b)(1) of the Act, 21 U.S.C. section 360bbb-3(b)(1), unless the authorization is terminated or revoked sooner. Performed at [facility], [place]  [address] [place], [place] [address]      Medications:   . aspirin EC  81 mg Oral Daily  . atenolol  50 mg Oral Daily  . atorvastatin  20 mg Oral QHS  . fluticasone furoate-vilanterol  1 puff Inhalation Daily  . furosemide  40 mg Intravenous BID  . gabapentin  300 mg Oral q morning - 10a  . insulin aspart  0-9 Units Subcutaneous TID WC  . melatonin  6 mg Oral QHS  . metoprolol tartrate  12.5 mg Oral BID  . pantoprazole  40 mg Oral BID  . prednisoLONE  40 mg Oral Daily  . sertraline  100 mg Oral Daily  . sodium chloride flush  3 mL Intravenous Q12H  . tamsulosin  0.4 mg Oral QHS  . traZODone  50 mg Oral QHS   Continuous Infusions: . sodium chloride    . heparin 950 Units/hr (05/25/19 0349)      LOS: 1 day   Charlynne Cousins  [facility]  05/25/2019, 7:13 AM

## 2019-05-25 NOTE — Progress Notes (Signed)
[place] for heparin Indication: chest pain/ACS  Allergies  Allergen Reactions  . Clindamycin/Lincomycin     Throat tightens up  . Contrast Media [Iodinated Diagnostic Agents] Shortness Of Breath    11/20/2004 reaction with CTA CAP  . Metformin And Related Other (See Comments)    Effected kidney function    Patient Measurements: Height: 5\' 9"  (175.3 cm) Weight: 71.5 kg (157 lb 10.1 oz) IBW/kg (Calculated) : 70.7 Heparin Dosing Weight: 71 kg   Vital Signs: BP: 139/88 (05/11 0730) Pulse Rate: 73 (05/11 0730)  Labs: Recent Labs    05/24/19 1129 05/24/19 1500 05/24/19 1944 05/24/19 2246 05/25/19 0516 05/25/19 0643  HGB 11.8*  --   --  11.9*  --   --   HCT 31.6*  --   --  35.0*  --   --   PLT 122*  --   --   --   --   --   HEPARINUNFRC  --   --   --   --  1.56* 0.12*  CREATININE 1.85*  --   --   --  1.55*  --   CKTOTAL  --   --  193  --   --   --   TROPONINIHS 1,480* 1,743*  --   --  668*  --     Estimated Creatinine Clearance: 44.3 mL/min (A) (by C-G formula based on SCr of 1.55 mg/dL (H)).  Assessment: 64 YOM with SOB and elevated troponin concerning for ACS. Pharmacy consulted to start IV heparin with no plans for immediate LHC. Heparin level is subtherapeutic. No bleeding noted. CBC is stable and no bleeding noted.   Goal of Therapy:  Heparin level 0.3-0.7 units/ml Monitor platelets by anticoagulation protocol: Yes   Plan:  Heparin bolus 2000 units IV x 1 Increase heparin gtt 1150 units/hr Check an 8 hr heparin level Daily heparin level and CBC  Salome Arnt, PharmD, BCPS Clinical Pharmacist Please see AMION for all pharmacy numbers 05/25/2019 7:52 AM

## 2019-05-25 NOTE — Evaluation (Signed)
Occupational Therapy Evaluation Patient Details Name: Adrian Andrews MRN: [id number] DOB: [birth year] Today's Date: 05/25/2019    History of Present Illness 69 y.o. male with PMH of HTN, HLD, carotid artery disease s/p L CEA, COPD, CVA, DM II and former tobacco use who presented dyspnea and elevated troponin. He was previously admitted in Sep 2020 with RLL PNA and elevated trop. Myoview at the time was low risk however showed moderate sized inferior wall defect suspected to be infarct with minimal ischemia   Clinical Impression   Pt PTA: Pt living in ALF/ILF meals are provided an dshuttle for transport. Pt reports independence with ADL and mobility; enjoys walking in the hallways and staying active. Pt currently limited by SOB, poor endurance, decreased ability to care for self. Pt requiring 3L O2 and required 4L O2  To maintain >90% after sit to stand and taking steps to [facility]. Pt education provided for energy conservation and handout in folder in room. Pt limited eval as pt had just arrived to room. Modified independence for  Bed mobility and minguardA for transfers- pt taking a few steps to [facility]. Pt does require continued OT skilled services. OT following acutely.      Follow Up Recommendations  Home health OT    Equipment Recommendations  None recommended by OT    Recommendations for Other Services       Precautions / Restrictions Precautions Precautions: Fall Restrictions Weight Bearing Restrictions: No      Mobility Bed Mobility Overal bed mobility: Independent                Transfers Overall transfer level: Needs assistance Equipment used: None Transfers: Sit to/from Stand Sit to Stand: Min guard         General transfer comment: guarding for safety with initial standing balance    Balance Overall balance assessment: Needs assistance Sitting-balance support: No upper extremity supported;Feet supported Sitting balance-Leahy Scale: Fair     Standing  balance support: No upper extremity supported;During functional activity Standing balance-Leahy Scale: Fair                             ADL either performed or assessed with clinical judgement   ADL Overall ADL's : Needs assistance/impaired Eating/Feeding: Modified independent;Sitting   Grooming: Set up;Sitting   Upper Body Bathing: Set up;Sitting   Lower Body Bathing: Minimal assistance;Sitting/lateral leans;Sit to/from stand   Upper Body Dressing : Set up;Sitting   Lower Body Dressing: Minimal assistance;Sitting/lateral leans;Sit to/from stand Lower Body Dressing Details (indicate cue type and reason): can donn socks in bed, but requires assist in standing to simulate pulling "pants" to waist Toilet Transfer: Minimal assistance;Stand-pivot;RW;Cueing for safety;BSC   Toileting- Clothing Manipulation and Hygiene: Minimal assistance;Sitting/lateral lean;Sit to/from stand;Cueing for safety       Functional mobility during ADLs: Min guard;Cueing for safety;Cueing for sequencing General ADL Comments: Pt limited by SOB, poor endurance, decreased ability to care for self. Pt requiring 3L O2 and required 4L after sit to stand and taking steps to [facility].     Vision Baseline Vision/History: No visual deficits Patient Visual Report: No change from baseline Vision Assessment?: No apparent visual deficits     Perception     Praxis      Pertinent Vitals/Pain Pain Assessment: 0-10 Pain Score: 7  Pain Location: back Pain Descriptors / Indicators: Aching;Grimacing;Guarding Pain Intervention(s): Monitored during session     Hand Dominance Right   Extremity/Trunk Assessment Upper  Extremity Assessment Upper Extremity Assessment: Generalized weakness   Lower Extremity Assessment Lower Extremity Assessment: Generalized weakness   Cervical / Trunk Assessment Cervical / Trunk Assessment: Normal   Communication Communication Communication: No difficulties   Cognition  Arousal/Alertness: Awake/alert Behavior During Therapy: WFL for tasks assessed/performed Overall Cognitive Status: Within Functional Limits for tasks assessed                                     General Comments  Pt's HR 80 BPM with exertion. Pt desats to 85% on 3L O2 with limited mobility to [facility]. Pt requiring 4L O2 to recove >90% for 30 secs prior to going back to 3L O2 to stay >90% with pursed lip breathing.    Exercises     Shoulder Instructions      Home Living Family/patient expects to be discharged to:: Private residence Living Arrangements: Alone Available Help at Discharge: Available PRN/intermittently Type of Home: Independent living facility([facility]) Home Access: Level entry     Home Layout: One level     Bathroom Shower/Tub: Occupational psychologist: Handicapped height     Home Equipment: Grab bars - toilet;Grab bars - tub/shower;Hand held shower head;Shower seat - built in   Additional Comments: 3L O2 at baseline      Prior Functioning/Environment Level of Independence: Independent        Comments: did not use device, facility gives pt meds and provides meals, shuttle for appts per facility, pt bathed and dressed self        OT Problem List: Decreased strength;Decreased activity tolerance;Impaired balance (sitting and/or standing);Decreased safety awareness;Increased edema;Cardiopulmonary status limiting activity      OT Treatment/Interventions: Self-care/ADL training;Therapeutic exercise;Energy conservation;DME and/or AE instruction;Therapeutic activities;Patient/family education;Balance training    OT Goals(Current goals can be found in the care plan section) Acute Rehab OT Goals Patient Stated Goal: to go home OT Goal Formulation: With patient Time For Goal Achievement: 06/08/19 Potential to Achieve Goals: Good ADL Goals Pt Will Perform Lower Body Dressing: (P) with modified independence;sitting/lateral leans;sit  to/from stand Additional ADL Goal #1: (P) Pt will maintain O2 sats >92% with ADL task in standing with 1 seated rest break in 5 mins. Additional ADL Goal #2: (P) Pt will recall/utilize 3 energy conservation techniques for OOB ADL/mobility to increase independence at home.  OT Frequency: Min 2X/week   Barriers to D/Andrews:            Co-evaluation              AM-PAC OT "6 Clicks" Daily Activity     Outcome Measure Help from another person eating meals?: None Help from another person taking care of personal grooming?: A Little Help from another person toileting, which includes using toliet, bedpan, or urinal?: A Little Help from another person bathing (including washing, rinsing, drying)?: A Little Help from another person to put on and taking off regular upper body clothing?: None Help from another person to put on and taking off regular lower body clothing?: A Little 6 Click Score: 20   End of Session Equipment Utilized During Treatment: Oxygen Nurse Communication: Mobility status  Activity Tolerance: Patient limited by fatigue;Patient limited by lethargy Patient left: in bed;with call bell/phone within reach  OT Visit Diagnosis: Unsteadiness on feet (R26.81);Muscle weakness (generalized) (M62.81)                Time: [id number] OT Time  Calculation (min): 16 min Charges:  OT General Charges $OT Visit: 1 Visit OT Evaluation $OT Eval Moderate Complexity: 1 Mod  Adrian Andrews, Adrian Andrews Acute Rehabilitation Services Pager: [phone] Office: [phone]   Adrian Andrews 05/25/2019, 5:11 PM

## 2019-05-25 NOTE — ED Notes (Signed)
hospitalist at bedside

## 2019-05-25 NOTE — Progress Notes (Signed)
  Echocardiogram 2D Echocardiogram has been performed.  Darlina Sicilian M 05/25/2019, 3:10 PM

## 2019-05-25 NOTE — Progress Notes (Addendum)
Progress Note  Patient Name: Adrian Andrews Date of Encounter: 05/25/2019  Primary Cardiologist: Minus Breeding, MD   Subjective   Denies any CP. Significant back pain. Feel bloated in the abdomen. Breathing improving.   He tells me he is not able to focus on anything besides his back hurting which is causing anxiety. Breathing and edema definitely better.  Inpatient Medications    Scheduled Meds: . aspirin EC  325 mg Oral Daily  . atorvastatin  20 mg Oral QHS  . fluticasone furoate-vilanterol  1 puff Inhalation Daily  . furosemide  40 mg Intravenous BID  . gabapentin  300 mg Oral q morning - 10a  . heparin  2,000 Units Intravenous Once  . insulin aspart  0-9 Units Subcutaneous TID WC  . insulin detemir  5 Units Subcutaneous BID  . melatonin  6 mg Oral QHS  . metoprolol tartrate  25 mg Oral BID  . pantoprazole  40 mg Oral BID  . sertraline  100 mg Oral Daily  . sodium chloride flush  3 mL Intravenous Q12H  . tamsulosin  0.4 mg Oral QHS  . traZODone  50 mg Oral QHS   Continuous Infusions: . sodium chloride    . heparin 1,150 Units/hr (05/25/19 0753)   PRN Meds: sodium chloride, acetaminophen, albuterol, calcium carbonate, HYDROcodone-acetaminophen, ondansetron (ZOFRAN) IV, sodium chloride flush, traMADol   Vital Signs    Vitals:   05/25/19 0331 05/25/19 0430 05/25/19 0600 05/25/19 0730  BP: 137/73 122/60 121/73 139/88  Pulse: 77 71 68 73  Resp: 17 16 (!) 21 (!) 21  Temp:      TempSrc:      SpO2: (!) 89% 96% 98% 95%  Weight:      Height:       No intake or output data in the 24 hours ending 05/25/19 0828 Last 3 Weights 05/24/2019 09/28/2018 09/27/2018  Weight (lbs) 157 lb 10.1 oz 157 lb 11.2 oz 156 lb 4.8 oz  Weight (kg) 71.5 kg 71.532 kg 70.897 kg      Telemetry    NSR without significant ventricular ectopy - Personally Reviewed  ECG    NSR with T wave inversion in anterior leads - Personally Reviewed  Physical Exam   GEN: No acute distress. However  notably uncomfortable Neck: +JVD with pulsatile JVp-8-10 cm water. Cardiac: RRR, no murmurs, rubs, or gallops. Frequent activity noted. Respiratory: diminished breath sound with baseline crackles. Also Rales the applicant with extra wheeze. GI: Soft, nontender, mildly protuberant and on to phonetic. He says he feels bloated MS: 2-3 + BLE edema; No deformity. Neuro:  Nonfocal  Psych: Normal affect   Labs    High Sensitivity Troponin:   Recent Labs  Lab 05/24/19 1129 05/24/19 1500 05/25/19 0516  TROPONINIHS 1,480* 1,743* 668*      Chemistry Recent Labs  Lab 05/24/19 1129 05/24/19 2246 05/25/19 0516  NA 134* 132* 133*  K 5.5* 5.5* 5.0  CL 105  --  101  CO2 19*  --  19*  GLUCOSE 224*  --  283*  BUN 41*  --  41*  CREATININE 1.85*  --  1.55*  CALCIUM 9.0  --  8.8*  PROT 6.1*  --   --   ALBUMIN 3.4*  --   --   AST 26  --   --   ALT 21  --   --   ALKPHOS 67  --   --   BILITOT 1.1  --   --  GFRNONAA 36*  --  45*  GFRAA 42*  --  52*  ANIONGAP 10  --  13     Hematology Recent Labs  Lab 05/24/19 1129 05/24/19 2246  WBC 5.5  --   RBC 2.93*  --   HGB 11.8* 11.9*  HCT 31.6* 35.0*  MCV 107.8*  --   MCH 40.3*  --   MCHC 37.3*  --   RDW 17.0*  --   PLT 122*  --     BNP Recent Labs  Lab 05/24/19 1129  BNP 1,733.6*     DDimer No results for input(s): DDIMER in the last 168 hours.   Radiology    US RENAL  Result Date: 05/24/2019 CLINICAL DATA:  Acute kidney injury EXAM: RENAL / URINARY TRACT ULTRASOUND COMPLETE COMPARISON:  None. FINDINGS: Right Kidney: Renal measurements: 9.4 x 4.6 x 4.7 cm = volume: 106.4 mL. Cortical echogenicity is normal. No hydronephrosis. Small exophytic cyst at the midpole measuring 1.2 cm. Left Kidney: Renal measurements: 8.7 x 5 x 5.2 cm = volume: 119.8 mL. Echogenicity within normal limits. No mass or hydronephrosis visualized. Bladder: Appears slightly thick walled Other: None. IMPRESSION: 1. Negative for hydronephrosis.  Small cyst  in the right kidney 2. Slightly thick-walled appearance of the bladder which is nonspecific, consider cystitis or potential chronic obstruction. Electronically Signed   By: Donavan Foil M.D.   On: 05/24/2019 20:34   DG Chest Portable 1 View  Result Date: 05/24/2019 CLINICAL DATA:  Shortness of breath, COPD EXAM: PORTABLE CHEST 1 VIEW COMPARISON:  09/20/2018 FINDINGS: Heart size is borderline enlarged, stable. Atherosclerotic calcification of the aortic knob. Mild streaky bibasilar opacities, right greater than left, favor atelectasis. No pleural effusion. No pneumothorax. IMPRESSION: Mild streaky bibasilar opacities, right greater than left, favor atelectasis. Electronically Signed   By: Davina Poke D.O.   On: 05/24/2019 12:22    Cardiac Studies   Echo 09/14/2018 1. The left ventricle has normal systolic function, with an ejection  fraction of 55-60%. The cavity size was normal. Left ventricular diastolic  Doppler parameters are consistent with pseudonormalization. No evidence of  left ventricular regional wall  motion abnormalities.  2. Mild calcification of the mitral valve leaflet. There is moderate  mitral annular calcification present. No evidence of mitral valve  stenosis. No significant mitral regurgitation.  3. The aortic valve is tricuspid. Mild calcification of the aortic valve.  No stenosis of the aortic valve.  4. The aortic root is normal in size and structure.  5. The right ventricle has mildly reduced systolic function. The cavity  was mildly enlarged. There is no increase in right ventricular wall  thickness. Mildly D-shaped interventricular septum suggests a degree of RV  pressure/volume overload.  6. The inferior vena cava was dilated in size with >50% respiratory  variability. No complete TR doppler jet so unable to estimate PA systolic  pressure.   Patient Profile     69 y.o. male with PMH of HTN, HLD, carotid artery disease s/p L CEA, COPD, CVA, DM II and  former tobacco use who presented dyspnea and elevated troponin. He was previously admitted in Sep 2020 with RLL PNA and elevated trop. Myoview at the time was low risk however showed moderate sized inferior wall defect suspected to be infarct with minimal ischemia.   Assessment & Plan    1. Acute CHF exacerbation: Still mildly volume overloaded, continue IV lasix 40mg  BID for 1-2 more day, likely switch to oral lasix tomorrow at 40mg   daily. Pending echocardiogram to reassess EF  - consider add ACEI/ARB given h/o DM II  2. Elevated troponin: 1480 --> 1743 --> 668  -would consider for cardiac catheterization once euvolemic; I had a talk with him today, but he was not interested in discussing in detail because of his back pain. He does not want to be cut on. He will take into consideration the possibility of heart catheterization, but not ready to decide yet.  3. HTN: continue metoprolol; wants renal function stable, agree with possibly adding ACE inhibitor ARB.  4. HLD: continue lipitor  5. Acute renal insufficiency: Cr 1.85 on arrival, baseline 1.19 -> improved 1.55 today.  6. COPD on 3L home O2 -> as per [facility] primary team. .  7. DM II -  as per [facility] primary team. .  8. H/o CVA -  Has mild veral communication difficulties.       For questions or updates, please contact [place] Please consult [email or web address] for contact info under        Signed, Almyra Deforest, [place]  05/25/2019, 8:28 AM    ATTENDING ATTESTATION  I have seen, examined and evaluated the patient this AM in ER along with Mr. Eulas Post, [place].  After reviewing all the available data and chart, we discussed the patients laboratory, study & physical findings as well as symptoms in detail. I agree with his findings, examination as well as impression recommendations as per our discussion.    Attending adjustments noted in italics.   Unfortunately, Mr. Vanecek is still downstairs in the ER. He is very uncomfortable laying on the  stretcher and not getting his pain medications was anxiety medications. This makes it very difficult for me to discuss his symptoms with [place]. Clinically he does look notably improved but still has volume overload with edema and JVD. We can reassess his diuresis tomorrow. He may require more than just one more day evaluate diuretic.  Once his renal function stabilizes, I do agree with possibly adding ACE-I/ARB. Continue beta-blocker and statin.  Once his renal function stabilizes, and he is better diaries, we can complete discussion with him about whether not he would be interested in proceeding with heart catheterization. He clearly has a Living Will with DNR in place. He does not have any operations, but will need to discuss the nuance between an operation and a cardiac catheterization. Will continue to discuss, but until he moves into the room, he is not willing to discuss.  Echo still pending. This may help reinforce treatment plan.   Glenetta Hew, M.D., M.S. Interventional Cardiologist   Pager # [phone] Phone # [phone] [place], [place] [address]

## 2019-05-25 NOTE — Plan of Care (Signed)
  Problem: Education: Goal: Knowledge of General Education information will improve Description: Including pain rating scale, medication(s)/side effects and non-pharmacologic comfort measures Outcome: Progressing   Problem: Health Behavior/Discharge Planning: Goal: Ability to manage health-related needs will improve Outcome: Progressing   Problem: Clinical Measurements: Goal: Ability to maintain clinical measurements within normal limits will improve Outcome: Progressing Goal: Will remain free from infection Outcome: Progressing Goal: Diagnostic test results will improve Outcome: Progressing Goal: Respiratory complications will improve Outcome: Progressing Goal: Cardiovascular complication will be avoided Outcome: Progressing   Problem: Activity: Goal: Risk for activity intolerance will decrease Outcome: Progressing   Problem: Nutrition: Goal: Adequate nutrition will be maintained Outcome: Progressing   Problem: Coping: Goal: Level of anxiety will decrease Outcome: Progressing   Problem: Elimination: Goal: Will not experience complications related to bowel motility Outcome: Progressing Goal: Will not experience complications related to urinary retention Outcome: Progressing   Problem: Pain Managment: Goal: General experience of comfort will improve Outcome: Progressing   Problem: Safety: Goal: Ability to remain free from injury will improve Outcome: Progressing   Problem: Skin Integrity: Goal: Risk for impaired skin integrity will decrease Outcome: Progressing   Problem: Education: Goal: Ability to describe self-care measures that may prevent or decrease complications (Diabetes Survival Skills Education) will improve Outcome: Progressing Goal: Individualized Educational Video(s) Outcome: Progressing   Problem: Coping: Goal: Ability to adjust to condition or change in health will improve Outcome: Progressing   Problem: Fluid Volume: Goal: Ability to  maintain a balanced intake and output will improve Outcome: Progressing   Problem: Health Behavior/Discharge Planning: Goal: Ability to identify and utilize available resources and services will improve Outcome: Progressing Goal: Ability to manage health-related needs will improve Outcome: Progressing   Problem: Metabolic: Goal: Ability to maintain appropriate glucose levels will improve Outcome: Progressing   Problem: Nutritional: Goal: Maintenance of adequate nutrition will improve Outcome: Progressing Goal: Progress toward achieving an optimal weight will improve Outcome: Progressing   Problem: Skin Integrity: Goal: Risk for impaired skin integrity will decrease Outcome: Progressing   Problem: Tissue Perfusion: Goal: Adequacy of tissue perfusion will improve Outcome: Progressing   Problem: Education: Goal: Ability to demonstrate management of disease process will improve Outcome: Progressing Goal: Ability to verbalize understanding of medication therapies will improve Outcome: Progressing Goal: Individualized Educational Video(s) Outcome: Progressing   Problem: Activity: Goal: Capacity to carry out activities will improve Outcome: Progressing   Problem: Cardiac: Goal: Ability to achieve and maintain adequate cardiopulmonary perfusion will improve Outcome: Progressing   

## 2019-05-25 NOTE — ED Notes (Signed)
Lunch Tray Ordered 

## 2019-05-25 NOTE — Progress Notes (Deleted)
[place] for heparin Indication: chest pain/ACS  Allergies  Allergen Reactions  . Clindamycin/Lincomycin     Throat tightens up  . Contrast Media [Iodinated Diagnostic Agents] Shortness Of Breath    11/20/2004 reaction with CTA CAP  . Metformin And Related Other (See Comments)    Effected kidney function    Patient Measurements: Height: 5\' 9"  (175.3 cm) Weight: 71.5 kg (157 lb 10.1 oz) IBW/kg (Calculated) : 70.7 Heparin Dosing Weight: 71 kg   Vital Signs: BP: 137/73 (05/11 0331) Pulse Rate: 77 (05/11 0331)  Labs: Recent Labs    05/24/19 1129 05/24/19 1500 05/24/19 1944 05/24/19 2246  HGB 11.8*  --   --  11.9*  HCT 31.6*  --   --  35.0*  PLT 122*  --   --   --   CREATININE 1.85*  --   --   --   CKTOTAL  --   --  193  --   TROPONINIHS 1,480* 1,743*  --   --     Estimated Creatinine Clearance: 37.2 mL/min (A) (by C-G formula based on SCr of 1.85 mg/dL (H)).  Assessment: 69 yo male with SOB and elevated troponin for heparin  Goal of Therapy:  Heparin level 0.3-0.7 units/ml Monitor platelets by anticoagulation protocol: Yes   Plan:  -Heparin 2000 units IV bolus, then increase heparin 1150 units/hr Check heparin level in 8 hours.  Phillis Knack, PharmD, BCPS

## 2019-05-25 NOTE — Progress Notes (Signed)
[place] for heparin Indication: chest pain/ACS  Allergies  Allergen Reactions  . Clindamycin/Lincomycin     Throat tightens up  . Contrast Media [Iodinated Diagnostic Agents] Shortness Of Breath    11/20/2004 reaction with CTA CAP  . Metformin And Related Other (See Comments)    Effected kidney function    Patient Measurements: Height: 5\' 9"  (175.3 cm) Weight: 81.6 kg (179 lb 12.8 oz) IBW/kg (Calculated) : 70.7 Heparin Dosing Weight: 71 kg   Vital Signs: Temp: 97.9 F (36.6 C) (05/11 1548) Temp Source: Oral (05/11 1548) BP: 140/69 (05/11 1548) Pulse Rate: 65 (05/11 1548)  Labs: Recent Labs    05/24/19 1129 05/24/19 1129 05/24/19 1500 05/24/19 1944 05/24/19 2246 05/25/19 0516 05/25/19 0643 05/25/19 1621  HGB 11.8*   < >  --   --  11.9* 10.6*  --   --   HCT 31.6*  --   --   --  35.0* 31.6*  --   --   PLT 122*  --   --   --   --  PLATELET CLUMPS NOTED ON SMEAR, UNABLE TO ESTIMATE  --   --   HEPARINUNFRC  --   --   --   --   --  1.56* 0.12* 0.25*  CREATININE 1.85*  --   --   --   --  1.55*  --   --   CKTOTAL  --   --   --  193  --   --   --   --   TROPONINIHS 1,480*  --  1,743*  --   --  668*  --   --    < > = values in this interval not displayed.    Estimated Creatinine Clearance: 44.3 mL/min (A) (by C-G formula based on SCr of 1.55 mg/dL (H)).  Assessment: 70 YOM with SOB and elevated troponin concerning for ACS. Pharmacy consulted to start IV heparin with no plans for immediate LHC. Heparin level is subtherapeutic; no issue with heparin infusion.  Goal of Therapy:  Heparin level 0.3-0.7 units/ml Monitor platelets by anticoagulation protocol: Yes   Plan:  Increase heparin gtt to 1350 units/hr F/U AM labs  Kseniya Grunden D. Mina Marble, PharmD, BCPS, [place] 05/25/2019, 6:40 PM

## 2019-05-26 DIAGNOSIS — I214 Non-ST elevation (NSTEMI) myocardial infarction: Principal | ICD-10-CM

## 2019-05-26 LAB — CBC
HCT: 34.3 % — ABNORMAL LOW (ref 39.0–52.0)
Hemoglobin: 12.6 g/dL — ABNORMAL LOW (ref 13.0–17.0)
MCH: 38.9 pg — ABNORMAL HIGH (ref 26.0–34.0)
MCHC: 36.7 g/dL — ABNORMAL HIGH (ref 30.0–36.0)
MCV: 105.9 fL — ABNORMAL HIGH (ref 80.0–100.0)
Platelets: 128 10*3/uL — ABNORMAL LOW (ref 150–400)
RBC: 3.24 MIL/uL — ABNORMAL LOW (ref 4.22–5.81)
RDW: 16.9 % — ABNORMAL HIGH (ref 11.5–15.5)
WBC: 7.4 10*3/uL (ref 4.0–10.5)
nRBC: 0 % (ref 0.0–0.2)

## 2019-05-26 LAB — GLUCOSE, CAPILLARY
Glucose-Capillary: 109 mg/dL — ABNORMAL HIGH (ref 70–99)
Glucose-Capillary: 170 mg/dL — ABNORMAL HIGH (ref 70–99)
Glucose-Capillary: 181 mg/dL — ABNORMAL HIGH (ref 70–99)
Glucose-Capillary: 172 mg/dL — ABNORMAL HIGH (ref 70–99)

## 2019-05-26 LAB — LIPID PANEL
Cholesterol: 81 mg/dL (ref 0–200)
HDL: 28 mg/dL — ABNORMAL LOW (ref >40)
LDL Cholesterol: 36 mg/dL (ref 0–99)
Total CHOL/HDL Ratio: 2.9 RATIO
Triglycerides: 87 mg/dL (ref <150)
VLDL: 17 mg/dL (ref 0–40)

## 2019-05-26 LAB — BASIC METABOLIC PANEL
Anion gap: 11 (ref 5–15)
BUN: 42 mg/dL — ABNORMAL HIGH (ref 8–23)
CO2: 24 mmol/L (ref 22–32)
Calcium: 9.7 mg/dL (ref 8.9–10.3)
Chloride: 100 mmol/L (ref 98–111)
Creatinine, Ser: 1.69 mg/dL — ABNORMAL HIGH (ref 0.61–1.24)
GFR calc Af Amer: 47 mL/min — ABNORMAL LOW (ref >60)
GFR calc non Af Amer: 40 mL/min — ABNORMAL LOW (ref >60)
Glucose, Bld: 160 mg/dL — ABNORMAL HIGH (ref 70–99)
Potassium: 4.9 mmol/L (ref 3.5–5.1)
Sodium: 135 mmol/L (ref 135–145)

## 2019-05-26 LAB — HEPARIN LEVEL (UNFRACTIONATED): Heparin Unfractionated: 0.85 IU/mL — ABNORMAL HIGH (ref 0.30–0.70)

## 2019-05-26 MED ORDER — ASPIRIN EC 81 MG PO TBEC
81.0000 mg | DELAYED_RELEASE_TABLET | Freq: Every day | ORAL | Status: DC
  Administered 2019-05-27 – 2019-05-31 (×5): 81 mg via ORAL
  Filled 2019-05-26 (×5): qty 1

## 2019-05-26 MED ORDER — ADULT MULTIVITAMIN W/MINERALS CH
1.0000 | ORAL_TABLET | Freq: Every day | ORAL | Status: DC
  Administered 2019-05-26 – 2019-05-31 (×6): 1 via ORAL
  Filled 2019-05-26 (×6): qty 1

## 2019-05-26 MED ORDER — CLOPIDOGREL BISULFATE 75 MG PO TABS
75.0000 mg | ORAL_TABLET | Freq: Every day | ORAL | Status: DC
  Administered 2019-05-26 – 2019-05-31 (×6): 75 mg via ORAL
  Filled 2019-05-26 (×6): qty 1

## 2019-05-26 MED ORDER — LOPERAMIDE HCL 2 MG PO CAPS
2.0000 mg | ORAL_CAPSULE | Freq: Once | ORAL | Status: AC
  Administered 2019-05-26: 21:00:00 2 mg via ORAL
  Filled 2019-05-26: qty 1

## 2019-05-26 MED ORDER — GLUCERNA SHAKE PO LIQD
237.0000 mL | Freq: Three times a day (TID) | ORAL | Status: DC
  Administered 2019-05-26 – 2019-05-30 (×9): 237 mL via ORAL

## 2019-05-26 MED ORDER — ATORVASTATIN CALCIUM 40 MG PO TABS
40.0000 mg | ORAL_TABLET | Freq: Every day | ORAL | Status: DC
  Administered 2019-05-26 – 2019-05-30 (×5): 40 mg via ORAL
  Filled 2019-05-26 (×5): qty 1

## 2019-05-26 NOTE — Progress Notes (Signed)
Initial Nutrition Assessment  RD working remotely.  DOCUMENTATION CODES:   Not applicable  INTERVENTION:   -Glucerna Shake po TID, each supplement provides 220 kcal and 10 grams of protein -MVI with minerals daily  NUTRITION DIAGNOSIS:   Increased nutrient needs related to chronic illness(COPD) as evidenced by estimated needs.  GOAL:   Patient will meet greater than or equal to 90% of their needs  MONITOR:   PO intake, Supplement acceptance, Labs, Weight trends, Skin, I & O's  REASON FOR ASSESSMENT:   Consult Assessment of nutrition requirement/status  ASSESSMENT:   Adrian Andrews is a 69 y.o. male with medical history significant of chronic hypoxic respiratory failure secondary COPD, IDDM, hypertension, HLD, presented with increasing short of breath for 4 days.  Symptoms more cough with whitish phlegm, wheezing, denies any chest pain, no fever chills.  Symptoms gradually getting worse, today even minimum activity can induce significant dyspnea.  Pt admitted with CHF and COPD exacerbation.   Reviewed I/O's: +577 ml x 24 hours  Attempted to speak with pt via phone, however, no answer. Unable to obtain further nutrition-related history or complete nutrition-focused physical exam at this time.   Pt with good appetite; noted meal completion 75%.   Reviewed wt hx; noted wt increase over the past year, however, this may be related to edema.   Medications reviewed and include lasix and melatonin.   Lab Results  Component Value Date   HGBA1C 8.5 (H) 05/24/2019   PTA DM medications are 10 mg glipizide BID and pioglitazone daily. Per ADA's Standards of Medical Care of Diabetes, glycemic targets for older adults who have multiple co-morbidities, cognitive impairments, and functional dependence should be less stringent (Hgb A1c <8.0-8.5).   Labs reviewed: CBGS: 109-170 (inpatient orders for glycemic control are 0-9 units insulin aspart TID with meals and 5 units insulin detemir  BID ).   Diet Order:   Diet Order            Diet heart healthy/carb modified Room service appropriate? Yes; Fluid consistency: Thin  Diet effective now              EDUCATION NEEDS:   No education needs have been identified at this time  Skin:  Skin Assessment: Skin Integrity Issues: Skin Integrity Issues:: Other (Comment) Other: MASD to lt groin  Last BM:  05/26/19  Height:   Ht Readings from Last 1 Encounters:  05/25/19 5\' 9"  (1.753 m)    Weight:   Wt Readings from Last 1 Encounters:  05/26/19 80.3 kg    Ideal Body Weight:  72.7 kg  BMI:  Body mass index is 26.14 kg/m.  Estimated Nutritional Needs:   Kcal:  2000-2200  Protein:  105-120 grams  Fluid:  > 2 L    Loistine Chance, RD, LDN, [place] Registered Dietitian II Certified Diabetes Care and Education Specialist Please refer to [facility] for RD and/or RD on-call/weekend/after hours pager

## 2019-05-26 NOTE — Progress Notes (Signed)
Physical Therapy Treatment Patient Details Name: Adrian Andrews MRN: [id number] DOB: August 14, 1950 Today's Date: 05/26/2019    History of Present Illness 69 y.o. male with PMH of HTN, HLD, carotid artery disease s/p L CEA, COPD, CVA, DM II and former tobacco use who presented dyspnea and elevated troponin. He was previously admitted in Sep 2020 with RLL PNA and elevated trop. Myoview at the time was low risk however showed moderate sized inferior wall defect suspected to be infarct with minimal ischemia    PT Comments    Pt progressing towards goals, however, remains limited secondary to decreased oxygen sats. Only able to tolerate short distance before oxygen sats dropped to 85% on 4L. Required prolonged seated rest and pursed lip breathing to return to 91% on 4L. Educated about importance of activity pacing. Feel pt would benefit from portable O2 tank at home in order to ease mobility at home and out in community. Will continue to follow acutely to maximize functional mobility independence and safety.     Follow Up Recommendations  Home health PT;Supervision - Intermittent     Equipment Recommendations  Other (comment)(portable O2 tank)    Recommendations for Other Services       Precautions / Restrictions Precautions Precautions: Fall Precaution Comments: Watch O2 sats Restrictions Weight Bearing Restrictions: No    Mobility  Bed Mobility Overal bed mobility: Independent                Transfers Overall transfer level: Needs assistance Equipment used: None Transfers: Sit to/from Stand Sit to Stand: Min guard         General transfer comment: Min guard for safety.   Ambulation/Gait Ambulation/Gait assistance: Min guard Gait Distance (Feet): 8 Feet Assistive device: None Gait Pattern/deviations: Step-through pattern Gait velocity: Decreased   General Gait Details: Walked short distance on 4L, however, oxygen sats decreased to 85%. Required prolonged seated  rest and pursed lip breathing to return to 91% on 4L. Educated about importance of activity pacing and rest breaks at home.    Stairs             Wheelchair Mobility    Modified Rankin (Stroke Patients Only)       Balance Overall balance assessment: Needs assistance Sitting-balance support: No upper extremity supported;Feet supported Sitting balance-Leahy Scale: Fair     Standing balance support: No upper extremity supported;During functional activity Standing balance-Leahy Scale: Fair                              Cognition Arousal/Alertness: Awake/alert Behavior During Therapy: WFL for tasks assessed/performed Overall Cognitive Status: Within Functional Limits for tasks assessed                                        Exercises      General Comments        Pertinent Vitals/Pain Pain Assessment: No/denies pain    Home Living                      Prior Function            PT Goals (current goals can now be found in the care plan section) Acute Rehab PT Goals Patient Stated Goal: to go home PT Goal Formulation: With patient Time For Goal Achievement: 06/08/19 Potential to Achieve Goals: Good Progress  towards PT goals: Progressing toward goals    Frequency    Min 3X/week      PT Plan Current plan remains appropriate    Co-evaluation              AM-PAC PT "6 Clicks" Mobility   Outcome Measure  Help needed turning from your back to your side while in a flat bed without using bedrails?: None Help needed moving from lying on your back to sitting on the side of a flat bed without using bedrails?: None Help needed moving to and from a bed to a chair (including a wheelchair)?: A Little Help needed standing up from a chair using your arms (e.g., wheelchair or bedside chair)?: A Little Help needed to walk in hospital room?: A Little Help needed climbing 3-5 steps with a railing? : A Little 6 Click Score:  20    End of Session Equipment Utilized During Treatment: Gait belt;Oxygen Activity Tolerance: Treatment limited secondary to medical complications (Comment)(decreased oxygen sats) Patient left: in bed;with call bell/phone within reach(sitting EOB ) Nurse Communication: Mobility status;Patient requests pain meds PT Visit Diagnosis: Unsteadiness on feet (R26.81);Muscle weakness (generalized) (M62.81)     Time: [id number] PT Time Calculation (min) (ACUTE ONLY): 14 min  Charges:  $Therapeutic Activity: 8-22 mins                     Lou Miner, DPT  Acute Rehabilitation Services  Pager: [phone] Office: [phone]    Rudean Hitt 05/26/2019, 2:37 PM

## 2019-05-26 NOTE — Progress Notes (Signed)
[facility] PROGRESS NOTE    Progress Note  Adrian Andrews  [id number] DOB: 07/13/1950 DOA: 05/24/2019 PCP: [name], MD     Brief Narrative:   Adrian Andrews is an 69 y.o. male past medical history significant of chronic respiratory failure secondary COPD oxygen dependent, insulin-dependent diabetes mellitus, status post left carotid endarterectomy, history of CVA essential hypertension comes in for 4 days of dyspnea.he relates some productive cough denies any fever or chills.  In the ED was found to be hypoxic potassium of 5 bicarbonate 19 creatinine 1.8 d  Assessment/Plan:   Acute respiratory failure with hypoxia  Acute diastolic CHF Non-ST elevation MI, elevated troponin -Admitted with fluid overload, elevated high-sensitivity troponins -Cardiology consulted, diuresed with IV Lasix, volume status is improving -Still remains fluid overloaded, continue IV Lasix today -Cardiology following, patient declines left heart catheterization, understands risks, benefits -Discontinue IV heparin today -Start Plavix per cards recommendations, continue aspirin, metoprolol, statin -PT OT eval  Acute kidney injury on chronic kidney disease stage II: His baseline creatinine is around 1 on admission 1.8, improving with diuresis. -Likely cardiorenal, monitor  Mild hypervolemic hyponatremia: -Resolved  Hyperkalemia Likely due to acute kidney injury, improving  Type 2 diabetes mellitus with other specified complication (HCC) Hemoglobin A1c of 8.5, agree with stopping Actos. -CBG stable, continue long-acting insulin and sliding scale  Essential hypertension: -Stable, continue metoprolol and IV Lasix today  DVT prophylaxis: lovenox Family Communication:none Status is: Inpatient  Remains inpatient appropriate because: Pending improvement in volume status   Dispo: The patient is from: Home              Anticipated d/c is to: Home              Anticipated d/c date is: >  1-2 days              Patient currently is not medically stable to d/c.  Code Status:     Code Status Orders  (From admission, onward)         Start     Ordered   05/24/19 1656  Do not attempt resuscitation (DNR)  Continuous    Question Answer Comment  In the event of cardiac or respiratory ARREST Do not call a "code blue"   In the event of cardiac or respiratory ARREST Do not perform Intubation, CPR, defibrillation or ACLS   In the event of cardiac or respiratory ARREST Use medication by any route, position, wound care, and other measures to relive pain and suffering. May use oxygen, suction and manual treatment of airway obstruction as needed for comfort.      05/24/19 1657        Code Status History    Date Active Date Inactive Code Status Order ID Comments User Context   09/10/2018 1200 09/28/2018 1752 DNR [id number]  Karmen Bongo, MD ED   02/13/2018 1906 02/16/2018 1645 Full Code [id number]  Elmarie Shiley, MD ED   12/20/2017 1608 12/23/2017 1737 Full Code [id number]  Tawni Millers, MD Inpatient   02/21/2017 1701 02/22/2017 1449 Full Code [id number]  Gabriel Earing, PA-C Inpatient   Advance Care Planning Activity    Advance Directive Documentation     Most Recent Value  Type of Advance Directive  Living will  Pre-existing out of facility DNR order (yellow form or pink MOST form)  --  "MOST" Form in Place?  --        IV Access:    Peripheral IV  Procedures and diagnostic studies:   US RENAL  Result Date: 05/24/2019 CLINICAL DATA:  Acute kidney injury EXAM: RENAL / URINARY TRACT ULTRASOUND COMPLETE COMPARISON:  None. FINDINGS: Right Kidney: Renal measurements: 9.4 x 4.6 x 4.7 cm = volume: 106.4 mL. Cortical echogenicity is normal. No hydronephrosis. Small exophytic cyst at the midpole measuring 1.2 cm. Left Kidney: Renal measurements: 8.7 x 5 x 5.2 cm = volume: 119.8 mL. Echogenicity within normal limits. No mass or hydronephrosis visualized. Bladder:  Appears slightly thick walled Other: None. IMPRESSION: 1. Negative for hydronephrosis.  Small cyst in the right kidney 2. Slightly thick-walled appearance of the bladder which is nonspecific, consider cystitis or potential chronic obstruction. Electronically Signed   By: Donavan Foil M.D.   On: 05/24/2019 20:34   ECHOCARDIOGRAM COMPLETE  Result Date: 05/25/2019    ECHOCARDIOGRAM REPORT   Patient Name:   Adrian Andrews Date of Exam: 05/25/2019 Medical Rec #:  [id number]      Height:       69.0 in Accession #:    [id number]     Weight:       157.6 lb Date of Birth:  08-04-1950     BSA:          1.867 m Patient Age:    47 years       BP:           131/69 mmHg Patient Gender: M              HR:           64 bpm. Exam Location:  Inpatient Procedure: 2D Echo Indications:    CHF-Acute Systolic [id number] / [id number]  History:        Patient has prior history of Echocardiogram examinations, most                 recent 09/14/2018. CHF, COPD and Stroke; Risk                 Factors:Hypertension, Diabetes, Dyslipidemia and Former Smoker.                 Elevated troponin.  Sonographer:    Darlina Sicilian RDCS Referring Phys: [place]  1. Left ventricular ejection fraction, by estimation, is 55 to 60%. The left ventricle has normal function. The left ventricle has no regional wall motion abnormalities. Left ventricular diastolic parameters are consistent with Grade II diastolic dysfunction (pseudonormalization). Elevated left atrial pressure.  2. Right ventricular systolic function is mildly reduced. The right ventricular size is mildly enlarged. There is mildly elevated pulmonary artery systolic pressure. The estimated right ventricular systolic pressure is [id number] mmHg.  3. The mitral valve is normal in structure. No evidence of mitral valve regurgitation. No evidence of mitral stenosis.  4. The aortic valve is tricuspid. Aortic valve regurgitation is not visualized. Mild aortic valve sclerosis is present,  with no evidence of aortic valve stenosis.  5. The inferior vena cava is dilated in size with <50% respiratory variability, suggesting right atrial pressure of 15 mmHg. Comparison(s): No significant change from prior study. Prior images reviewed side by side. FINDINGS  Left Ventricle: Left ventricular ejection fraction, by estimation, is 55 to 60%. The left ventricle has normal function. The left ventricle has no regional wall motion abnormalities. The left ventricular internal cavity size was normal in size. There is  no left ventricular hypertrophy. Left ventricular diastolic parameters are consistent with Grade II diastolic dysfunction (pseudonormalization). Elevated left atrial  pressure. Right Ventricle: The right ventricular size is mildly enlarged. No increase in right ventricular wall thickness. Right ventricular systolic function is mildly reduced. There is mildly elevated pulmonary artery systolic pressure. The tricuspid regurgitant  velocity is 2.40 m/s, and with an assumed right atrial pressure of 15 mmHg, the estimated right ventricular systolic pressure is [id number] mmHg. Left Atrium: Left atrial size was normal in size. Right Atrium: Right atrial size was normal in size. Pericardium: There is no evidence of pericardial effusion. Mitral Valve: The mitral valve is normal in structure. Normal mobility of the mitral valve leaflets. Mild mitral annular calcification. No evidence of mitral valve regurgitation. No evidence of mitral valve stenosis. Tricuspid Valve: The tricuspid valve is normal in structure. Tricuspid valve regurgitation is mild . No evidence of tricuspid stenosis. Aortic Valve: The aortic valve is tricuspid. Aortic valve regurgitation is not visualized. Mild aortic valve sclerosis is present, with no evidence of aortic valve stenosis. Pulmonic Valve: The pulmonic valve was grossly normal. Pulmonic valve regurgitation is trivial. No evidence of pulmonic stenosis. Aorta: The aortic root is normal  in size and structure. Venous: The inferior vena cava is dilated in size with less than 50% respiratory variability, suggesting right atrial pressure of 15 mmHg. IAS/Shunts: No atrial level shunt detected by color flow Doppler.  LEFT VENTRICLE PLAX 2D LVIDd:         4.70 cm      Diastology LVIDs:         4.00 cm      LV e' lateral:   7.38 cm/s LV PW:         1.00 cm      LV E/e' lateral: 15.6 LV IVS:        1.00 cm      LV e' medial:    3.97 cm/s LVOT diam:     2.10 cm      LV E/e' medial:  29.0 LV SV:         70 LV SV Index:   38 LVOT Area:     3.46 cm  LV Volumes (MOD) LV vol d, MOD A2C: 105.0 ml LV vol d, MOD A4C: 94.3 ml LV vol s, MOD A2C: 70.5 ml LV vol s, MOD A4C: 36.0 ml LV SV MOD A2C:     34.5 ml LV SV MOD A4C:     94.3 ml LV SV MOD BP:      56.3 ml RIGHT VENTRICLE RV S prime:     7.95 cm/s TAPSE (M-mode): 1.3 cm LEFT ATRIUM             Index       RIGHT ATRIUM           Index LA diam:        3.00 cm 1.61 cm/m  RA Area:     20.00 cm LA Vol (A2C):   86.4 ml 46.27 ml/m RA Volume:   56.60 ml  30.31 ml/m LA Vol (A4C):   49.2 ml 26.35 ml/m LA Biplane Vol: 64.7 ml 34.65 ml/m  AORTIC VALVE LVOT Vmax:   87.50 cm/s LVOT Vmean:  58.800 cm/s LVOT VTI:    0.203 m  AORTA Ao Root diam: 3.50 cm MITRAL VALVE                TRICUSPID VALVE MV Area (PHT): 3.99 cm     TR Peak grad:   23.0 mmHg MV Decel Time: 190 msec     TR Vmax:  240.00 cm/s MV E velocity: 115.00 cm/s MV A velocity: 95.20 cm/s   SHUNTS MV E/A ratio:  1.21         Systemic VTI:  0.20 m                             Systemic Diam: 2.10 cm Dani Gobble Croitoru MD Electronically signed by Sanda Klein MD Signature Date/Time: 05/25/2019/3:26:01 PM    Final      Medical Consultants:    None.  Anti-Infectives:   None  Subjective:    Adrian Andrews feels better, breathing is improving, denies any chest pain, declined heart catheterization  Objective:    Vitals:   05/26/19 0435 05/26/19 0538 05/26/19 0803 05/26/19 1204  BP: (!) 104/57    (!) 106/56  Pulse: (!) 56   68  Resp: 13   20  Temp: 97.9 F (36.6 C)     TempSrc: Oral     SpO2: 99%  96% 91%  Weight:  80.3 kg    Height:       SpO2: 91 % O2 Flow Rate (L/min): 3 L/min   Intake/Output Summary (Last 24 hours) at 05/26/2019 1324 Last data filed at 05/26/2019 0135 Gross per 24 hour  Intake 576.85 ml  Output --  Net 576.85 ml   Filed Weights   05/24/19 1116 05/25/19 1545 05/26/19 0538  Weight: 71.5 kg 81.6 kg 80.3 kg    Exam: General exam: AAOx3, no distress Respiratory system: Few basilar rales, otherwise clear Cardiovascular system: S1 & S2 heard, RRR.  Positive JVD Gastrointestinal system: Abdomen is nondistended, soft and nontender.  Central nervous system: Alert and oriented. No focal neurological deficits. Extremities: 2+ edema Skin: No rashes, lesions or ulcers Psychiatry: Judgment and insight appear normal, mood and affect seem appropriate.   Data Reviewed:    Labs: Basic Metabolic Panel: Recent Labs  Lab 05/24/19 1129 05/24/19 1129 05/24/19 2246 05/24/19 2246 05/25/19 0516 05/26/19 0839  NA 134*  --  132*  --  133* 135  K 5.5*   < > 5.5*   < > 5.0 4.9  CL 105  --   --   --  101 100  CO2 19*  --   --   --  19* 24  GLUCOSE 224*  --   --   --  283* 160*  BUN 41*  --   --   --  41* 42*  CREATININE 1.85*  --   --   --  1.55* 1.69*  CALCIUM 9.0  --   --   --  8.8* 9.7   < > = values in this interval not displayed.   GFR Estimated Creatinine Clearance: 40.7 mL/min (A) (by C-G formula based on SCr of 1.69 mg/dL (H)). Liver Function Tests: Recent Labs  Lab 05/24/19 1129  AST 26  ALT 21  ALKPHOS 67  BILITOT 1.1  PROT 6.1*  ALBUMIN 3.4*   No results for input(s): LIPASE, AMYLASE in the last 168 hours. No results for input(s): AMMONIA in the last 168 hours. Coagulation profile No results for input(s): INR, PROTIME in the last 168 hours. COVID-19 Labs  No results for input(s): DDIMER, FERRITIN, LDH, CRP in the last 72  hours.  Lab Results  Component Value Date   SARSCOV2NAA NEGATIVE 05/24/2019   SARSCOV2NAA NEGATIVE 09/28/2018   [place] NEGATIVE 09/13/2018   [place] NEGATIVE 09/10/2018    CBC: Recent Labs  Lab 05/24/19  1129 05/24/19 2246 05/25/19 0516 05/26/19 0839  WBC 5.5  --  3.1* 7.4  NEUTROABS 4.3  --   --   --   HGB 11.8* 11.9* 10.6* 12.6*  HCT 31.6* 35.0* 31.6* 34.3*  MCV 107.8*  --  103.6* 105.9*  PLT 122*  --  PLATELET CLUMPS NOTED ON SMEAR, UNABLE TO ESTIMATE 128*   Cardiac Enzymes: Recent Labs  Lab 05/24/19 1944  CKTOTAL 193   BNP (last 3 results) No results for input(s): PROBNP in the last 8760 hours. CBG: Recent Labs  Lab 05/25/19 1224 05/25/19 1638 05/25/19 2128 05/26/19 0620 05/26/19 1230  GLUCAP 167* 124* 161* 109* 170*   D-Dimer: No results for input(s): DDIMER in the last 72 hours. Hgb A1c: Recent Labs    05/24/19 2200  HGBA1C 8.5*   Lipid Profile: Recent Labs    05/26/19 0839  CHOL 81  HDL 28*  LDLCALC 36  TRIG 87  CHOLHDL 2.9   Thyroid function studies: No results for input(s): TSH, T4TOTAL, T3FREE, THYROIDAB in the last 72 hours.  Invalid input(s): FREET3 Anemia work up: No results for input(s): VITAMINB12, FOLATE, FERRITIN, TIBC, IRON, RETICCTPCT in the last 72 hours. Sepsis Labs: Recent Labs  Lab 05/24/19 1129 05/25/19 0516 05/26/19 0839  WBC 5.5 3.1* 7.4   Microbiology Recent Results (from the past 240 hour(s))  SARS Coronavirus 2 by RT PCR (hospital order, performed in [facility] hospital lab) Nasopharyngeal Nasopharyngeal Swab     Status: None   Collection Time: 05/24/19 11:29 AM   Specimen: Nasopharyngeal Swab  Result Value Ref Range Status   SARS Coronavirus 2 NEGATIVE NEGATIVE Final    Comment: (NOTE) SARS-CoV-2 target nucleic acids are NOT DETECTED. The SARS-CoV-2 RNA is generally detectable in upper and lower respiratory specimens during the acute phase of infection. The lowest concentration of SARS-CoV-2  viral copies this assay can detect is 250 copies / mL. A negative result does not preclude SARS-CoV-2 infection and should not be used as the sole basis for treatment or other patient management decisions.  A negative result may occur with improper specimen collection / handling, submission of specimen other than nasopharyngeal swab, presence of viral mutation(s) within the areas targeted by this assay, and inadequate number of viral copies (<250 copies / mL). A negative result must be combined with clinical observations, patient history, and epidemiological information. Fact Sheet for Patients:   StrictlyIdeas.no Fact Sheet for Healthcare Providers: BankingDealers.co.za This test is not yet approved or cleared  by the [place] FDA and has been authorized for detection and/or diagnosis of SARS-CoV-2 by FDA under an Emergency Use Authorization (EUA).  This EUA will remain in effect (meaning this test can be used) for the duration of the COVID-19 declaration under Section 564(b)(1) of the Act, 21 U.S.C. section 360bbb-3(b)(1), unless the authorization is terminated or revoked sooner. Performed at [facility], [place] [address], [place], [place] [address]      Medications:   . [START ON 05/27/2019] aspirin EC  81 mg Oral Daily  . atorvastatin  40 mg Oral QHS  . clopidogrel  75 mg Oral Daily  . clotrimazole  1 application Topical BID  . fluticasone furoate-vilanterol  1 puff Inhalation Daily  . furosemide  40 mg Intravenous BID  . gabapentin  300 mg Oral q morning - 10a  . insulin aspart  0-9 Units Subcutaneous TID WC  . insulin detemir  5 Units Subcutaneous BID  . melatonin  6 mg Oral QHS  .  metoprolol tartrate  25 mg Oral BID  . pantoprazole  40 mg Oral BID  . sertraline  100 mg Oral Daily  . sodium chloride flush  3 mL Intravenous Q12H  . tamsulosin  0.4 mg Oral QHS  . traZODone  50 mg Oral QHS   Continuous  Infusions: . sodium chloride        LOS: 2 days   Domenic Polite  [facility]  05/26/2019, 1:24 PM

## 2019-05-26 NOTE — Progress Notes (Addendum)
Progress Note  Patient Name: Adrian Andrews Date of Encounter: 05/26/2019  Primary Cardiologist: Minus Breeding, MD   Subjective   Breathing improving. Back pain also improving. No recent chest pain  He is in a much better mood.  Back pain and anxiety have notably improved since he moved up to the floor.  He is actually smiling today.  Inpatient Medications    Scheduled Meds: . aspirin EC  325 mg Oral Daily  . atorvastatin  20 mg Oral QHS  . clotrimazole  1 application Topical BID  . fluticasone furoate-vilanterol  1 puff Inhalation Daily  . furosemide  40 mg Intravenous BID  . gabapentin  300 mg Oral q morning - 10a  . insulin aspart  0-9 Units Subcutaneous TID WC  . insulin detemir  5 Units Subcutaneous BID  . melatonin  6 mg Oral QHS  . metoprolol tartrate  25 mg Oral BID  . pantoprazole  40 mg Oral BID  . sertraline  100 mg Oral Daily  . sodium chloride flush  3 mL Intravenous Q12H  . tamsulosin  0.4 mg Oral QHS  . traZODone  50 mg Oral QHS   Continuous Infusions: . sodium chloride    . heparin 1,300 Units/hr (05/26/19 0745)   PRN Meds: sodium chloride, acetaminophen, albuterol, calcium carbonate, HYDROcodone-acetaminophen, ondansetron (ZOFRAN) IV, simethicone, sodium chloride flush, traMADol   Vital Signs    Vitals:   05/26/19 0019 05/26/19 0435 05/26/19 0538 05/26/19 0803  BP: (!) 117/58 (!) 104/57    Pulse: 66 (!) 56    Resp: 18 13    Temp: 97.6 F (36.4 C) 97.9 F (36.6 C)    TempSrc: Oral Oral    SpO2: 95% 99%  96%  Weight:   80.3 kg   Height:        Intake/Output Summary (Last 24 hours) at 05/26/2019 1056 Last data filed at 05/26/2019 0135 Gross per 24 hour  Intake 576.85 ml  Output --  Net 576.85 ml   Last 3 Weights 05/26/2019 05/25/2019 05/24/2019  Weight (lbs) 177 lb 179 lb 12.8 oz 157 lb 10.1 oz  Weight (kg) 80.287 kg 81.557 kg 71.5 kg      Telemetry    NSR without significant ventricular ectopy - Personally Reviewed  ECG    NSR  with lateral TWI - Personally Reviewed  Physical Exam   GEN: No acute distress.   Neck: JVP ~8 cm water. Cardiac: RRR, normal S1 and S2.  1/6 SEM at RUSB.  Otherwise no rubs, or gallops.  Respiratory: Clear to auscultation bilaterally.  Nonlabored, with only minimal bibasal rales. GI: Soft, nontender, non-distended  MS:  Still has 2+ pitting edema in the lower extremities.  No deformity. Neuro:  Nonfocal  Psych: Normal affect & in a much more pleasant mood.  Labs    High Sensitivity Troponin:   Recent Labs  Lab 05/24/19 1129 05/24/19 1500 05/25/19 0516  TROPONINIHS 1,480* 1,743* 668*      Chemistry Recent Labs  Lab 05/24/19 1129 05/24/19 1129 05/24/19 2246 05/25/19 0516 05/26/19 0839  NA 134*   < > 132* 133* 135  K 5.5*   < > 5.5* 5.0 4.9  CL 105  --   --  101 100  CO2 19*  --   --  19* 24  GLUCOSE 224*  --   --  283* 160*  BUN 41*  --   --  41* 42*  CREATININE 1.85*  --   --  1.55* 1.69*  CALCIUM 9.0  --   --  8.8* 9.7  PROT 6.1*  --   --   --   --   ALBUMIN 3.4*  --   --   --   --   AST 26  --   --   --   --   ALT 21  --   --   --   --   ALKPHOS 67  --   --   --   --   BILITOT 1.1  --   --   --   --   GFRNONAA 36*  --   --  45* 40*  GFRAA 42*  --   --  52* 47*  ANIONGAP 10  --   --  13 11   < > = values in this interval not displayed.     Hematology Recent Labs  Lab 05/24/19 1129 05/24/19 1129 05/24/19 2246 05/25/19 0516 05/26/19 0839  WBC 5.5  --   --  3.1* 7.4  RBC 2.93*  --   --  3.05* 3.24*  HGB 11.8*   < > 11.9* 10.6* 12.6*  HCT 31.6*   < > 35.0* 31.6* 34.3*  MCV 107.8*  --   --  103.6* 105.9*  MCH 40.3*  --   --  34.8* 38.9*  MCHC 37.3*  --   --  33.5 36.7*  RDW 17.0*  --   --  14.9 16.9*  PLT 122*  --   --  PLATELET CLUMPS NOTED ON SMEAR, UNABLE TO ESTIMATE 128*   < > = values in this interval not displayed.    BNP Recent Labs  Lab 05/24/19 1129  BNP 1,733.6*     DDimer No results for input(s): DDIMER in the last 168 hours.    Radiology    US RENAL  Result Date: 05/24/2019 CLINICAL DATA:  Acute kidney injury EXAM: RENAL / URINARY TRACT ULTRASOUND COMPLETE COMPARISON:  None. FINDINGS: Right Kidney: Renal measurements: 9.4 x 4.6 x 4.7 cm = volume: 106.4 mL. Cortical echogenicity is normal. No hydronephrosis. Small exophytic cyst at the midpole measuring 1.2 cm. Left Kidney: Renal measurements: 8.7 x 5 x 5.2 cm = volume: 119.8 mL. Echogenicity within normal limits. No mass or hydronephrosis visualized. Bladder: Appears slightly thick walled Other: None. IMPRESSION: 1. Negative for hydronephrosis.  Small cyst in the right kidney 2. Slightly thick-walled appearance of the bladder which is nonspecific, consider cystitis or potential chronic obstruction. Electronically Signed   By: Donavan Foil M.D.   On: 05/24/2019 20:34   DG Chest Portable 1 View  Result Date: 05/24/2019 CLINICAL DATA:  Shortness of breath, COPD EXAM: PORTABLE CHEST 1 VIEW COMPARISON:  09/20/2018 FINDINGS: Heart size is borderline enlarged, stable. Atherosclerotic calcification of the aortic knob. Mild streaky bibasilar opacities, right greater than left, favor atelectasis. No pleural effusion. No pneumothorax. IMPRESSION: Mild streaky bibasilar opacities, right greater than left, favor atelectasis. Electronically Signed   By: Davina Poke D.O.   On: 05/24/2019 12:22   ECHOCARDIOGRAM COMPLETE  Result Date: 05/25/2019    ECHOCARDIOGRAM REPORT   Patient Name:   Adrian Andrews Date of Exam: 05/25/2019 Medical Rec #:  [id number]      Height:       69.0 in Accession #:    [id number]     Weight:       157.6 lb Date of Birth:  10-29-1950     BSA:  1.867 m Patient Age:    62 years       BP:           131/69 mmHg Patient Gender: M              HR:           64 bpm. Exam Location:  Inpatient Procedure: 2D Echo Indications:    CHF-Acute Systolic [id number] / [id number]  History:        Patient has prior history of Echocardiogram examinations, most                  recent 09/14/2018. CHF, COPD and Stroke; Risk                 Factors:Hypertension, Diabetes, Dyslipidemia and Former Smoker.                 Elevated troponin.  Sonographer:    Darlina Sicilian RDCS Referring Phys: [place]  1. Left ventricular ejection fraction, by estimation, is 55 to 60%. The left ventricle has normal function. The left ventricle has no regional wall motion abnormalities. Left ventricular diastolic parameters are consistent with Grade II diastolic dysfunction (pseudonormalization). Elevated left atrial pressure.  2. Right ventricular systolic function is mildly reduced. The right ventricular size is mildly enlarged. There is mildly elevated pulmonary artery systolic pressure. The estimated right ventricular systolic pressure is 99991111 mmHg.  3. The mitral valve is normal in structure. No evidence of mitral valve regurgitation. No evidence of mitral stenosis.  4. The aortic valve is tricuspid. Aortic valve regurgitation is not visualized. Mild aortic valve sclerosis is present, with no evidence of aortic valve stenosis.  5. The inferior vena cava is dilated in size with <50% respiratory variability, suggesting right atrial pressure of 15 mmHg. Comparison(s): No significant change from prior study. Prior images reviewed side by side. FINDINGS  Left Ventricle: Left ventricular ejection fraction, by estimation, is 55 to 60%. The left ventricle has normal function. The left ventricle has no regional wall motion abnormalities. The left ventricular internal cavity size was normal in size. There is  no left ventricular hypertrophy. Left ventricular diastolic parameters are consistent with Grade II diastolic dysfunction (pseudonormalization). Elevated left atrial pressure. Right Ventricle: The right ventricular size is mildly enlarged. No increase in right ventricular wall thickness. Right ventricular systolic function is mildly reduced. There is mildly elevated pulmonary artery  systolic pressure. The tricuspid regurgitant  velocity is 2.40 m/s, and with an assumed right atrial pressure of 15 mmHg, the estimated right ventricular systolic pressure is 99991111 mmHg. Left Atrium: Left atrial size was normal in size. Right Atrium: Right atrial size was normal in size. Pericardium: There is no evidence of pericardial effusion. Mitral Valve: The mitral valve is normal in structure. Normal mobility of the mitral valve leaflets. Mild mitral annular calcification. No evidence of mitral valve regurgitation. No evidence of mitral valve stenosis. Tricuspid Valve: The tricuspid valve is normal in structure. Tricuspid valve regurgitation is mild . No evidence of tricuspid stenosis. Aortic Valve: The aortic valve is tricuspid. Aortic valve regurgitation is not visualized. Mild aortic valve sclerosis is present, with no evidence of aortic valve stenosis. Pulmonic Valve: The pulmonic valve was grossly normal. Pulmonic valve regurgitation is trivial. No evidence of pulmonic stenosis. Aorta: The aortic root is normal in size and structure. Venous: The inferior vena cava is dilated in size with less than 50% respiratory variability, suggesting right atrial pressure of 15 mmHg.  IAS/Shunts: No atrial level shunt detected by color flow Doppler.  LEFT VENTRICLE PLAX 2D LVIDd:         4.70 cm      Diastology LVIDs:         4.00 cm      LV e' lateral:   7.38 cm/s LV PW:         1.00 cm      LV E/e' lateral: 15.6 LV IVS:        1.00 cm      LV e' medial:    3.97 cm/s LVOT diam:     2.10 cm      LV E/e' medial:  29.0 LV SV:         70 LV SV Index:   38 LVOT Area:     3.46 cm  LV Volumes (MOD) LV vol d, MOD A2C: 105.0 ml LV vol d, MOD A4C: 94.3 ml LV vol s, MOD A2C: 70.5 ml LV vol s, MOD A4C: 36.0 ml LV SV MOD A2C:     34.5 ml LV SV MOD A4C:     94.3 ml LV SV MOD BP:      56.3 ml RIGHT VENTRICLE RV S prime:     7.95 cm/s TAPSE (M-mode): 1.3 cm LEFT ATRIUM             Index       RIGHT ATRIUM           Index LA diam:         3.00 cm 1.61 cm/m  RA Area:     20.00 cm LA Vol (A2C):   86.4 ml 46.27 ml/m RA Volume:   56.60 ml  30.31 ml/m LA Vol (A4C):   49.2 ml 26.35 ml/m LA Biplane Vol: 64.7 ml 34.65 ml/m  AORTIC VALVE LVOT Vmax:   87.50 cm/s LVOT Vmean:  58.800 cm/s LVOT VTI:    0.203 m  AORTA Ao Root diam: 3.50 cm MITRAL VALVE                TRICUSPID VALVE MV Area (PHT): 3.99 cm     TR Peak grad:   23.0 mmHg MV Decel Time: 190 msec     TR Vmax:        240.00 cm/s MV E velocity: 115.00 cm/s MV A velocity: 95.20 cm/s   SHUNTS MV E/A ratio:  1.21         Systemic VTI:  0.20 m                             Systemic Diam: 2.10 cm Sanda Klein MD Electronically signed by Sanda Klein MD Signature Date/Time: 05/25/2019/3:26:01 PM    Final     Cardiac Studies   Echo 05/25/2019: EF 55-60%.  No R WMA (would argue against large MI).  GRII DD with elevated LAP.  Mild reduced RV function with mild RVH and suggestion of elevated pulmonary pressures-38 mmHg.  This correlates also with dilated IVC suggesting elevated RAP of 15 mmHg.  Mild aortic valve sclerosis.  No stenosis.  Patient Profile     69 y.o. male with PMH of HTN, HLD, carotid artery disease s/p L CEA, COPD, CVA, DM II and former tobacco use who presented dyspnea and elevated troponin. He was previously admitted in Sep 2020 with RLL PNA and elevated trop. Myoview at the time was low risk however showed moderate sized inferior wall defect  suspected to be infarct with minimal ischemia.   Assessment & Plan    1. Acute Diastolic CHF exacerbation:    - weight down from 180 lbs to 177 lbs. I/O not accurate  - no BP room to add ACEI/ARB given h/o DM II  - Echo 05/25/2019 showed EF 55-60%, grade 2 DD, RVSP 38 mmHg  - Cr went up slightly, still mildly volume overloaded, continue IV lasix 40mg  BID, likely switch to oral lasix tomorrow at 40mg  daily prior to discharge  2. Elevated troponin: 1480 --> 1743 --> 668            - discussed with the patient regarding cardiac  catheterization, he is stable enough to proceed, however he does not wish to proceed with cardiac catheterization even after knowing that medical therapy cannot take away underlying blockages.   - therefore, agree with starting plavix given NSTEMI  3. HTN: continue metoprolol; no BP room to add ACEI/ARB at this time  4. HLD: continue lipitor  5. Acute renal insufficiency: Cr 1.85 on arrival, baseline 1.19 -> 1.55 --> 1.69.  6. COPD on 3L home O2 - per primary team  7. DM II - per primary team  8. H/o CVA      For questions or updates, please contact [place] Please consult [email or web address] for contact info under        Signed, Almyra Deforest, [place]  05/26/2019, 10:56 AM  \   ATTENDING ATTESTATION  I have seen, examined and evaluated the patient this morning on rounds along with Almyra Deforest, PA.  After reviewing all the available data and chart, we discussed the patients laboratory, study & physical findings as well as symptoms in detail. I agree with his findings, examination as well as impression recommendations as per our discussion.    Attending adjustments noted in italics.   [place] is doing much better today.  He is in better spirits.  I think getting him out of the ER made a big difference.  His back pain and anxiety are now being treated.  He has diuresed relatively well and the edema is notably improved.  Echo yesterday did show grade 2 diastolic dysfunction but preserved EF and no regional wall motion normalities.  This does mention with argue that his positive troponin may simply have been demand ischemia from CHF.  However, cannot exclude non-STEMI.  We had a good talk today, and he is quite clear that he does not want to have invasive procedures including heart catheterization performed. Completed heparin course.  I do agree with adding Plavix. He still has evidence of volume overload on exam and with elevated JVP.  I suspect that the echo done yesterday was indicative of  how significant he had been currently as part overload.  Plan will be probably another day of IV diuresis reassess tomorrow.  With no plans for ischemic evaluation, which is simply keep him on beta-blocker aspirin and Plavix. Continue current dose of statin.  We will continue to follow.     Glenetta Hew, M.D., M.S. Interventional Cardiologist   Pager # [phone] Phone # [phone] [address] [place],  [address]

## 2019-05-26 NOTE — Progress Notes (Signed)
ANTICOAGULATION CONSULT NOTE - Follow Up Consult  Pharmacy Consult for IV heparin Indication: chest pain/ACS  Allergies  Allergen Reactions  . Clindamycin/Lincomycin     Throat tightens up  . Contrast Media [Iodinated Diagnostic Agents] Shortness Of Breath    11/20/2004 reaction with CTA CAP  . Metformin And Related Other (See Comments)    Effected kidney function    Patient Measurements: Height: 5\' 9"  (175.3 cm) Weight: 80.3 kg (177 lb) IBW/kg (Calculated) : 70.7 Heparin Dosing Weight: 80 kg  Vital Signs: Temp: 97.9 F (36.6 C) (05/12 0435) Temp Source: Oral (05/12 0435) BP: 104/57 (05/12 0435) Pulse Rate: 56 (05/12 0435)  Labs: Recent Labs    05/24/19 1129 05/24/19 1129 05/24/19 1500 05/24/19 1944 05/24/19 2246 05/24/19 2246 05/25/19 0516 05/25/19 0516 05/25/19 0643 05/25/19 1621 05/26/19 0839  HGB 11.8*   < >  --   --  11.9*   < > 10.6*  --   --   --  12.6*  HCT 31.6*   < >  --   --  35.0*  --  31.6*  --   --   --  34.3*  PLT 122*  --   --   --   --   --  PLATELET CLUMPS NOTED ON SMEAR, UNABLE TO ESTIMATE  --   --   --  128*  HEPARINUNFRC  --   --   --   --   --   --  1.56*   < > 0.12* 0.25* 0.85*  CREATININE 1.85*  --   --   --   --   --  1.55*  --   --   --  1.69*  CKTOTAL  --   --   --  193  --   --   --   --   --   --   --   TROPONINIHS 1,480*  --  1,743*  --   --   --  668*  --   --   --   --    < > = values in this interval not displayed.    Estimated Creatinine Clearance: 40.7 mL/min (A) (by C-G formula based on SCr of 1.69 mg/dL (H)).   Medications:  Scheduled:  . aspirin EC  325 mg Oral Daily  . atorvastatin  20 mg Oral QHS  . clotrimazole  1 application Topical BID  . fluticasone furoate-vilanterol  1 puff Inhalation Daily  . furosemide  40 mg Intravenous BID  . gabapentin  300 mg Oral q morning - 10a  . insulin aspart  0-9 Units Subcutaneous TID WC  . insulin detemir  5 Units Subcutaneous BID  . melatonin  6 mg Oral QHS  . metoprolol  tartrate  25 mg Oral BID  . pantoprazole  40 mg Oral BID  . sertraline  100 mg Oral Daily  . sodium chloride flush  3 mL Intravenous Q12H  . tamsulosin  0.4 mg Oral QHS  . traZODone  50 mg Oral QHS    Assessment: 54 YOM presents with SOB and elevated troponin concerning for ACS. Pharmacy consulted to dose IV heparin. Heparin level of 0.85 is supratherapeutic with a goal of 0.3-0.7. Current infusion is 1350 units/hr.   Goal of Therapy:  Heparin level 0.3-0.7 units/ml Monitor platelets by anticoagulation protocol: Yes   Plan:  Decrease heparin infusion to 1200 units/hr. F/u 6h heparin level. Monitor CBC, HL, s/sx of bleeding   Aniella Wandrey 05/26/2019,10:34 AM

## 2019-05-27 DIAGNOSIS — R0602 Shortness of breath: Secondary | ICD-10-CM

## 2019-05-27 LAB — BASIC METABOLIC PANEL
Anion gap: 11 (ref 5–15)
BUN: 37 mg/dL — ABNORMAL HIGH (ref 8–23)
CO2: 29 mmol/L (ref 22–32)
Calcium: 9.5 mg/dL (ref 8.9–10.3)
Chloride: 98 mmol/L (ref 98–111)
Creatinine, Ser: 1.59 mg/dL — ABNORMAL HIGH (ref 0.61–1.24)
GFR calc Af Amer: 50 mL/min — ABNORMAL LOW (ref >60)
GFR calc non Af Amer: 43 mL/min — ABNORMAL LOW (ref >60)
Glucose, Bld: 155 mg/dL — ABNORMAL HIGH (ref 70–99)
Potassium: 5.5 mmol/L — ABNORMAL HIGH (ref 3.5–5.1)
Sodium: 138 mmol/L (ref 135–145)

## 2019-05-27 LAB — CBC
HCT: 34.4 % — ABNORMAL LOW (ref 39.0–52.0)
Hemoglobin: 12.5 g/dL — ABNORMAL LOW (ref 13.0–17.0)
MCH: 37.8 pg — ABNORMAL HIGH (ref 26.0–34.0)
MCHC: 36.3 g/dL — ABNORMAL HIGH (ref 30.0–36.0)
MCV: 103.9 fL — ABNORMAL HIGH (ref 80.0–100.0)
Platelets: 145 10*3/uL — ABNORMAL LOW (ref 150–400)
RBC: 3.31 MIL/uL — ABNORMAL LOW (ref 4.22–5.81)
RDW: 15.9 % — ABNORMAL HIGH (ref 11.5–15.5)
WBC: 5.8 10*3/uL (ref 4.0–10.5)
nRBC: 0 % (ref 0.0–0.2)

## 2019-05-27 LAB — GLUCOSE, CAPILLARY
Glucose-Capillary: 139 mg/dL — ABNORMAL HIGH (ref 70–99)
Glucose-Capillary: 272 mg/dL — ABNORMAL HIGH (ref 70–99)
Glucose-Capillary: 188 mg/dL — ABNORMAL HIGH (ref 70–99)
Glucose-Capillary: 337 mg/dL — ABNORMAL HIGH (ref 70–99)

## 2019-05-27 MED ORDER — INSULIN ASPART 100 UNIT/ML ~~LOC~~ SOLN
4.0000 [IU] | Freq: Once | SUBCUTANEOUS | Status: AC
  Administered 2019-05-28: 4 [IU] via SUBCUTANEOUS

## 2019-05-27 MED ORDER — SALINE SPRAY 0.65 % NA SOLN
1.0000 | NASAL | Status: DC | PRN
  Administered 2019-05-29: 1 via NASAL
  Filled 2019-05-27: qty 44

## 2019-05-27 MED ORDER — SODIUM ZIRCONIUM CYCLOSILICATE 5 G PO PACK
5.0000 g | PACK | Freq: Once | ORAL | Status: DC
  Filled 2019-05-27 (×2): qty 1

## 2019-05-27 NOTE — Progress Notes (Addendum)
Progress Note  Patient Name: Adrian Andrews Date of Encounter: 05/27/2019  Primary Cardiologist: Minus Breeding, MD   Subjective   Breathing improving.  Reports nasal irritation from cannula.  No chest pain.  Inpatient Medications    Scheduled Meds: . aspirin EC  81 mg Oral Daily  . atorvastatin  40 mg Oral QHS  . clopidogrel  75 mg Oral Daily  . clotrimazole  1 application Topical BID  . feeding supplement (GLUCERNA SHAKE)  237 mL Oral TID BM  . fluticasone furoate-vilanterol  1 puff Inhalation Daily  . furosemide  40 mg Intravenous BID  . gabapentin  300 mg Oral q morning - 10a  . insulin aspart  0-9 Units Subcutaneous TID WC  . insulin detemir  5 Units Subcutaneous BID  . melatonin  6 mg Oral QHS  . metoprolol tartrate  25 mg Oral BID  . multivitamin with minerals  1 tablet Oral Daily  . pantoprazole  40 mg Oral BID  . sertraline  100 mg Oral Daily  . sodium chloride flush  3 mL Intravenous Q12H  . sodium zirconium cyclosilicate  5 g Oral Once  . tamsulosin  0.4 mg Oral QHS  . traZODone  50 mg Oral QHS   Continuous Infusions: . sodium chloride     PRN Meds: sodium chloride, acetaminophen, albuterol, calcium carbonate, HYDROcodone-acetaminophen, ondansetron (ZOFRAN) IV, simethicone, sodium chloride flush, traMADol   Vital Signs    Vitals:   05/26/19 1933 05/27/19 0321 05/27/19 0332 05/27/19 0742  BP: 139/68 (!) 120/58    Pulse: 78 69    Resp: 18 18    Temp: 98.2 F (36.8 C) (!) 97.5 F (36.4 C)    TempSrc: Oral Oral    SpO2: 97% 95%  95%  Weight:   78.5 kg   Height:        Intake/Output Summary (Last 24 hours) at 05/27/2019 0942 Last data filed at 05/27/2019 0829 Gross per 24 hour  Intake 840 ml  Output --  Net 840 ml   Last 3 Weights 05/27/2019 05/26/2019 05/25/2019  Weight (lbs) 173 lb 177 lb 179 lb 12.8 oz  Weight (kg) 78.472 kg 80.287 kg 81.557 kg      Telemetry    Normal sinus rhythm- Personally Reviewed  ECG    No new tracing  Physical  Exam   GEN: No acute distress.   Neck: Elevated JVD Cardiac: normal S1 and S2.  1/6 SEM at RUSB.  RRR, no rubs, or gallops.  Respiratory:  Faint Rales left greater than right. GI: Soft, nontender, non-distended  MS:  1-2+ edema; No deformity. Neuro:  Nonfocal  Psych: Normal affect   Labs    High Sensitivity Troponin:   Recent Labs  Lab 05/24/19 1129 05/24/19 1500 05/25/19 0516  TROPONINIHS 1,480* 1,743* 668*      Chemistry Recent Labs  Lab 05/24/19 1129 05/24/19 2246 05/25/19 0516 05/26/19 0839 05/27/19 0651  NA 134*   < > 133* 135 138  K 5.5*   < > 5.0 4.9 5.5*  CL 105  --  101 100 98  CO2 19*  --  19* 24 29  GLUCOSE 224*  --  283* 160* 155*  BUN 41*  --  41* 42* 37*  CREATININE 1.85*  --  1.55* 1.69* 1.59*  CALCIUM 9.0  --  8.8* 9.7 9.5  PROT 6.1*  --   --   --   --   ALBUMIN 3.4*  --   --   --   --  AST 26  --   --   --   --   ALT 21  --   --   --   --   ALKPHOS 67  --   --   --   --   BILITOT 1.1  --   --   --   --   GFRNONAA 36*  --  45* 40* 43*  GFRAA 42*  --  52* 47* 50*  ANIONGAP 10  --  13 11 11    < > = values in this interval not displayed.     Hematology Recent Labs  Lab 05/25/19 0516 05/26/19 0839 05/27/19 0651  WBC 3.1* 7.4 5.8  RBC 3.05* 3.24* 3.31*  HGB 10.6* 12.6* 12.5*  HCT 31.6* 34.3* 34.4*  MCV 103.6* 105.9* 103.9*  MCH 34.8* 38.9* 37.8*  MCHC 33.5 36.7* 36.3*  RDW 14.9 16.9* 15.9*  PLT PLATELET CLUMPS NOTED ON SMEAR, UNABLE TO ESTIMATE 128* 145*    BNP Recent Labs  Lab 05/24/19 1129  BNP 1,733.6*      Radiology    ECHOCARDIOGRAM COMPLETE  Result Date: 05/25/2019    ECHOCARDIOGRAM REPORT   Patient Name:   DANTRE SCHEIRER Date of Exam: 05/25/2019 Medical Rec #:  [id number]      Height:       69.0 in Accession #:    [id number]     Weight:       157.6 lb Date of Birth:  October 17, 1950     BSA:          1.867 m Patient Age:    69 years       BP:           131/69 mmHg Patient Gender: M              HR:           64 bpm. Exam  Location:  Inpatient Procedure: 2D Echo Indications:    CHF-Acute Systolic [id number] / [id number]  History:        Patient has prior history of Echocardiogram examinations, most                 recent 09/14/2018. CHF, COPD and Stroke; Risk                 Factors:Hypertension, Diabetes, Dyslipidemia and Former Smoker.                 Elevated troponin.  Sonographer:    Darlina Sicilian RDCS Referring Phys: [place]  1. Left ventricular ejection fraction, by estimation, is 55 to 60%. The left ventricle has normal function. The left ventricle has no regional wall motion abnormalities. Left ventricular diastolic parameters are consistent with Grade II diastolic dysfunction (pseudonormalization). Elevated left atrial pressure.  2. Right ventricular systolic function is mildly reduced. The right ventricular size is mildly enlarged. There is mildly elevated pulmonary artery systolic pressure. The estimated right ventricular systolic pressure is 99991111 mmHg.  3. The mitral valve is normal in structure. No evidence of mitral valve regurgitation. No evidence of mitral stenosis.  4. The aortic valve is tricuspid. Aortic valve regurgitation is not visualized. Mild aortic valve sclerosis is present, with no evidence of aortic valve stenosis.  5. The inferior vena cava is dilated in size with <50% respiratory variability, suggesting right atrial pressure of 15 mmHg. Comparison(s): No significant change from prior study. Prior images reviewed side by side. FINDINGS  Left Ventricle: Left ventricular ejection fraction,  by estimation, is 55 to 60%. The left ventricle has normal function. The left ventricle has no regional wall motion abnormalities. The left ventricular internal cavity size was normal in size. There is  no left ventricular hypertrophy. Left ventricular diastolic parameters are consistent with Grade II diastolic dysfunction (pseudonormalization). Elevated left atrial pressure. Right Ventricle: The right  ventricular size is mildly enlarged. No increase in right ventricular wall thickness. Right ventricular systolic function is mildly reduced. There is mildly elevated pulmonary artery systolic pressure. The tricuspid regurgitant  velocity is 2.40 m/s, and with an assumed right atrial pressure of 15 mmHg, the estimated right ventricular systolic pressure is 99991111 mmHg. Left Atrium: Left atrial size was normal in size. Right Atrium: Right atrial size was normal in size. Pericardium: There is no evidence of pericardial effusion. Mitral Valve: The mitral valve is normal in structure. Normal mobility of the mitral valve leaflets. Mild mitral annular calcification. No evidence of mitral valve regurgitation. No evidence of mitral valve stenosis. Tricuspid Valve: The tricuspid valve is normal in structure. Tricuspid valve regurgitation is mild . No evidence of tricuspid stenosis. Aortic Valve: The aortic valve is tricuspid. Aortic valve regurgitation is not visualized. Mild aortic valve sclerosis is present, with no evidence of aortic valve stenosis. Pulmonic Valve: The pulmonic valve was grossly normal. Pulmonic valve regurgitation is trivial. No evidence of pulmonic stenosis. Aorta: The aortic root is normal in size and structure. Venous: The inferior vena cava is dilated in size with less than 50% respiratory variability, suggesting right atrial pressure of 15 mmHg. IAS/Shunts: No atrial level shunt detected by color flow Doppler.  LEFT VENTRICLE PLAX 2D LVIDd:         4.70 cm      Diastology LVIDs:         4.00 cm      LV e' lateral:   7.38 cm/s LV PW:         1.00 cm      LV E/e' lateral: 15.6 LV IVS:        1.00 cm      LV e' medial:    3.97 cm/s LVOT diam:     2.10 cm      LV E/e' medial:  29.0 LV SV:         70 LV SV Index:   38 LVOT Area:     3.46 cm  LV Volumes (MOD) LV vol d, MOD A2C: 105.0 ml LV vol d, MOD A4C: 94.3 ml LV vol s, MOD A2C: 70.5 ml LV vol s, MOD A4C: 36.0 ml LV SV MOD A2C:     34.5 ml LV SV MOD A4C:      94.3 ml LV SV MOD BP:      56.3 ml RIGHT VENTRICLE RV S prime:     7.95 cm/s TAPSE (M-mode): 1.3 cm LEFT ATRIUM             Index       RIGHT ATRIUM           Index LA diam:        3.00 cm 1.61 cm/m  RA Area:     20.00 cm LA Vol (A2C):   86.4 ml 46.27 ml/m RA Volume:   56.60 ml  30.31 ml/m LA Vol (A4C):   49.2 ml 26.35 ml/m LA Biplane Vol: 64.7 ml 34.65 ml/m  AORTIC VALVE LVOT Vmax:   87.50 cm/s LVOT Vmean:  58.800 cm/s LVOT VTI:    0.203 m  AORTA Ao Root diam: 3.50 cm MITRAL VALVE                TRICUSPID VALVE MV Area (PHT): 3.99 cm     TR Peak grad:   23.0 mmHg MV Decel Time: 190 msec     TR Vmax:        240.00 cm/s MV E velocity: 115.00 cm/s MV A velocity: 95.20 cm/s   SHUNTS MV E/A ratio:  1.21         Systemic VTI:  0.20 m                             Systemic Diam: 2.10 cm Sanda Klein MD Electronically signed by Sanda Klein MD Signature Date/Time: 05/25/2019/3:26:01 PM    Final     Cardiac Studies   Echo 05/25/19  IMPRESSIONS  1. Left ventricular ejection fraction, by estimation, is 55 to 60%. The left ventricle has normal function. The left ventricle has no regional wall motion abnormalities. Left ventricular diastolic parameters are consistent with Grade II diastolic dysfunction (pseudonormalization). Elevated left atrial pressure.  2. Right ventricular systolic function is mildly reduced. The right ventricular size is mildly enlarged. There is mildly elevated pulmonary artery systolic pressure. The estimated right ventricular systolic pressure is 99991111 mmHg.  3. The mitral valve is normal in structure. No evidence of mitral valve regurgitation. No evidence of mitral stenosis.  4. The aortic valve is tricuspid. Aortic valve regurgitation is not visualized. Mild aortic valve sclerosis is present, with no evidence of aortic valve stenosis.  5. The inferior vena cava is dilated in size with <50% respiratory variability, suggesting right atrial pressure of 15 mmHg. Comparison(s): No  significant change from prior study. Prior images reviewed side by side.  Patient Profile     69 y.o. male with PMH of HTN, HLD, carotid artery disease s/p L CEA, COPD, CVA, DM II and former tobacco use who presented dyspnea and elevated troponin. He was previously admitted in Sep 2020 with RLL PNA and elevated trop. Myoview at the time was low risk however showed moderate sized inferior wall defect suspected to be infarct with minimal ischemia.  Assessment & Plan    1.  Acute diastolic heart failure -BNP greater than 1700.  Echocardiogram showed LV function of 55 to 60% and grade 2 diastolic dysfunction.  INO +1 L, inaccurate. Weight down 5 lb 179>>173lb. Still volume overloaded on exam. -Continue IV diuresis.  BUN/creatinine improved. -Add compression stockings -Continue beta-blocker  2.  Hyperkalemia -Patient reports prior history as well -Not on any meds which holds potassium - Lokelma order by primary   3. Elevated troponin  -High-sensitivity troponin peaked at 1743.  No regional wall motion abnormality on echocardiogram. -Patient declined cardiac catheterization -Plan to treat medically with aspirin, Plavix and statin.  4. HTN - BP stable on current medications.   For questions or updates, please contact [facility] Please consult [email or web address] for contact info under        SignedLeanor Kail, PA  05/27/2019, 9:42 AM     ATTENDING ATTESTATION  I have seen, examined and evaluated the patient this AM along with Mr. Curly [place], [place].  After reviewing all the available data and chart, we discussed the patients laboratory, study & physical findings as well as symptoms in detail. I agree with his findings, examination as well as impression recommendations as per our discussion.    Attending adjustments noted in italics.  Continues to be just mildly volume overloaded.  I think finishing out the day with IV Lasix and reassess in the morning.  Hopefully at that time he  will see more euvolemic.  His numbers seem much better and he is feeling much better.  No plans for cardiac catheterization with no active angina.  We discussed support stockings and plans for still a little more diuresis.  He asked about going home.  I think the best case scenario will be to consider tomorrow afternoon however more likely Saturday 5/15.   Glenetta Hew, M.D., M.S. Interventional Cardiologist   Pager # [phone] Phone # [phone] [address] [place],  [address]

## 2019-05-27 NOTE — Progress Notes (Signed)
Patient was measured for compression knee highs.  Determined he needed a medium and when they arrived on the unit I put them on the patient and he said they felt fine.

## 2019-05-27 NOTE — Progress Notes (Signed)
[facility] PROGRESS NOTE    Progress Note  Adrian Andrews  [id number] DOB: 09-Jul-1950 DOA: 05/24/2019 PCP: [name], MD    Brief Narrative:   Adrian Andrews is an 69 y.o. male past medical history significant of chronic respiratory failure secondary COPD oxygen dependent, insulin-dependent diabetes mellitus, status post left carotid endarterectomy, history of CVA essential hypertension presented to ED with 4 days of dyspnea. In the ED was found to be hypoxic, CXR noted CHF, Troponins 1400  Assessment/Plan:   Acute respiratory failure with hypoxia  Acute diastolic CHF Non-ST elevation MI, elevated troponin -Admitted with fluid overload, elevated high-sensitivity troponins -Cardiology consulted, diuresed with IV Lasix, volume status is improving -Volume status is improving, still remains slightly fluid overloaded -I/O is inaccurate -Cardiology following, patient declined left heart catheterization -Started on Plavix, continue aspirin metoprolol and statin  Hyperkalemia -May be from renal insufficiency, -Lokelma x1, repeat BMP this afternoon, Lasix should help as well  Acute kidney injury on chronic kidney disease stage II: His baseline creatinine is around 1 on admission 1.8, improving with diuresis. -Likely cardiorenal, monitor  Mild hypervolemic hyponatremia: -Resolved  Type 2 diabetes mellitus with other specified complication (HCC) Hemoglobin A1c of 8.5, agree with stopping Actos. -CBG stable, continue long-acting insulin and sliding scale  Essential hypertension: -Stable, continue metoprolol and IV Lasix today  DVT prophylaxis: lovenox  CODE STATUS: Family Communication: Discussed with patient in detail, no family Status is: Inpatient Remains inpatient appropriate because: Pending improvement in volume status, hyperkalemia   Dispo: The patient is from: Home              Anticipated d/c is to: Home              Anticipated d/c date is: > 1-2  days              Patient currently is not medically stable to d/c.   Procedures and diagnostic studies:   ECHOCARDIOGRAM COMPLETE  Result Date: 05/25/2019    ECHOCARDIOGRAM REPORT   Patient Name:   Adrian Andrews Date of Exam: 05/25/2019 Medical Rec #:  [id number]      Height:       69.0 in Accession #:    [id number]     Weight:       157.6 lb Date of Birth:  May 08, 1950     BSA:          1.867 m Patient Age:    80 years       BP:           131/69 mmHg Patient Gender: M              HR:           64 bpm. Exam Location:  Inpatient Procedure: 2D Echo Indications:    CHF-Acute Systolic [id number] / [id number]  History:        Patient has prior history of Echocardiogram examinations, most                 recent 09/14/2018. CHF, COPD and Stroke; Risk                 Factors:Hypertension, Diabetes, Dyslipidemia and Former Smoker.                 Elevated troponin.  Sonographer:    Darlina Sicilian RDCS Referring Phys: [place]  1. Left ventricular ejection fraction, by estimation, is 55 to 60%. The left ventricle has  normal function. The left ventricle has no regional wall motion abnormalities. Left ventricular diastolic parameters are consistent with Grade II diastolic dysfunction (pseudonormalization). Elevated left atrial pressure.  2. Right ventricular systolic function is mildly reduced. The right ventricular size is mildly enlarged. There is mildly elevated pulmonary artery systolic pressure. The estimated right ventricular systolic pressure is [id number] mmHg.  3. The mitral valve is normal in structure. No evidence of mitral valve regurgitation. No evidence of mitral stenosis.  4. The aortic valve is tricuspid. Aortic valve regurgitation is not visualized. Mild aortic valve sclerosis is present, with no evidence of aortic valve stenosis.  5. The inferior vena cava is dilated in size with <50% respiratory variability, suggesting right atrial pressure of 15 mmHg. Comparison(s): No significant change  from prior study. Prior images reviewed side by side. FINDINGS  Left Ventricle: Left ventricular ejection fraction, by estimation, is 55 to 60%. The left ventricle has normal function. The left ventricle has no regional wall motion abnormalities. The left ventricular internal cavity size was normal in size. There is  no left ventricular hypertrophy. Left ventricular diastolic parameters are consistent with Grade II diastolic dysfunction (pseudonormalization). Elevated left atrial pressure. Right Ventricle: The right ventricular size is mildly enlarged. No increase in right ventricular wall thickness. Right ventricular systolic function is mildly reduced. There is mildly elevated pulmonary artery systolic pressure. The tricuspid regurgitant  velocity is 2.40 m/s, and with an assumed right atrial pressure of 15 mmHg, the estimated right ventricular systolic pressure is [id number] mmHg. Left Atrium: Left atrial size was normal in size. Right Atrium: Right atrial size was normal in size. Pericardium: There is no evidence of pericardial effusion. Mitral Valve: The mitral valve is normal in structure. Normal mobility of the mitral valve leaflets. Mild mitral annular calcification. No evidence of mitral valve regurgitation. No evidence of mitral valve stenosis. Tricuspid Valve: The tricuspid valve is normal in structure. Tricuspid valve regurgitation is mild . No evidence of tricuspid stenosis. Aortic Valve: The aortic valve is tricuspid. Aortic valve regurgitation is not visualized. Mild aortic valve sclerosis is present, with no evidence of aortic valve stenosis. Pulmonic Valve: The pulmonic valve was grossly normal. Pulmonic valve regurgitation is trivial. No evidence of pulmonic stenosis. Aorta: The aortic root is normal in size and structure. Venous: The inferior vena cava is dilated in size with less than 50% respiratory variability, suggesting right atrial pressure of 15 mmHg. IAS/Shunts: No atrial level shunt detected by  color flow Doppler.  LEFT VENTRICLE PLAX 2D LVIDd:         4.70 cm      Diastology LVIDs:         4.00 cm      LV e' lateral:   7.38 cm/s LV PW:         1.00 cm      LV E/e' lateral: 15.6 LV IVS:        1.00 cm      LV e' medial:    3.97 cm/s LVOT diam:     2.10 cm      LV E/e' medial:  29.0 LV SV:         70 LV SV Index:   38 LVOT Area:     3.46 cm  LV Volumes (MOD) LV vol d, MOD A2C: 105.0 ml LV vol d, MOD A4C: 94.3 ml LV vol s, MOD A2C: 70.5 ml LV vol s, MOD A4C: 36.0 ml LV SV MOD A2C:     34.5  ml LV SV MOD A4C:     94.3 ml LV SV MOD BP:      56.3 ml RIGHT VENTRICLE RV S prime:     7.95 cm/s TAPSE (M-mode): 1.3 cm LEFT ATRIUM             Index       RIGHT ATRIUM           Index LA diam:        3.00 cm 1.61 cm/m  RA Area:     20.00 cm LA Vol (A2C):   86.4 ml 46.27 ml/m RA Volume:   56.60 ml  30.31 ml/m LA Vol (A4C):   49.2 ml 26.35 ml/m LA Biplane Vol: 64.7 ml 34.65 ml/m  AORTIC VALVE LVOT Vmax:   87.50 cm/s LVOT Vmean:  58.800 cm/s LVOT VTI:    0.203 m  AORTA Ao Root diam: 3.50 cm MITRAL VALVE                TRICUSPID VALVE MV Area (PHT): 3.99 cm     TR Peak grad:   23.0 mmHg MV Decel Time: 190 msec     TR Vmax:        240.00 cm/s MV E velocity: 115.00 cm/s MV A velocity: 95.20 cm/s   SHUNTS MV E/A ratio:  1.21         Systemic VTI:  0.20 m                             Systemic Diam: 2.10 cm Dani Gobble Croitoru MD Electronically signed by Sanda Klein MD Signature Date/Time: 05/25/2019/3:26:01 PM    Final      Medical Consultants:    None.  Anti-Infectives:   None  Subjective:   Feels better, breathing is improving, still has edema Objective:    Vitals:   05/27/19 0321 05/27/19 0332 05/27/19 0742 05/27/19 1116  BP: (!) 120/58   (!) 114/59  Pulse: 69   77  Resp: 18   17  Temp: (!) 97.5 F (36.4 C)   97.8 F (36.6 C)  TempSrc: Oral   Oral  SpO2: 95%  95% 93%  Weight:  78.5 kg    Height:       SpO2: 93 % O2 Flow Rate (L/min): 4 L/min   Intake/Output Summary (Last 24 hours) at  05/27/2019 1152 Last data filed at 05/27/2019 0829 Gross per 24 hour  Intake 840 ml  Output --  Net 840 ml   Filed Weights   05/25/19 1545 05/26/19 0538 05/27/19 0332  Weight: 81.6 kg 80.3 kg 78.5 kg    Exam: General exam: AAOx3, no distress HEENT, positive JVD CVS S1-S2 regular rate rhythm Lungs few basilar rales, otherwise clear Extremities with 1-2+ ankle edema Skin: No rashes on exposed skin Psychiatry: Judgment and insight appear normal, mood and affect seem appropriate.   Data Reviewed:    Labs: Basic Metabolic Panel: Recent Labs  Lab 05/24/19 1129 05/24/19 1129 05/24/19 2246 05/24/19 2246 05/25/19 0516 05/25/19 0516 05/26/19 0839 05/27/19 0651  NA 134*  --  132*  --  133*  --  135 138  K 5.5*   < > 5.5*   < > 5.0   < > 4.9 5.5*  CL 105  --   --   --  101  --  100 98  CO2 19*  --   --   --  19*  --  24  29  GLUCOSE 224*  --   --   --  283*  --  160* 155*  BUN 41*  --   --   --  41*  --  42* 37*  CREATININE 1.85*  --   --   --  1.55*  --  1.69* 1.59*  CALCIUM 9.0  --   --   --  8.8*  --  9.7 9.5   < > = values in this interval not displayed.   GFR Estimated Creatinine Clearance: 43.2 mL/min (A) (by C-G formula based on SCr of 1.59 mg/dL (H)). Liver Function Tests: Recent Labs  Lab 05/24/19 1129  AST 26  ALT 21  ALKPHOS 67  BILITOT 1.1  PROT 6.1*  ALBUMIN 3.4*   No results for input(s): LIPASE, AMYLASE in the last 168 hours. No results for input(s): AMMONIA in the last 168 hours. Coagulation profile No results for input(s): INR, PROTIME in the last 168 hours. COVID-19 Labs  No results for input(s): DDIMER, FERRITIN, LDH, CRP in the last 72 hours.  Lab Results  Component Value Date   SARSCOV2NAA NEGATIVE 05/24/2019   [place] NEGATIVE 09/28/2018   [place] NEGATIVE 09/13/2018   [place] NEGATIVE 09/10/2018    CBC: Recent Labs  Lab 05/24/19 1129 05/24/19 2246 05/25/19 0516 05/26/19 0839 05/27/19 0651  WBC 5.5  --  3.1* 7.4  5.8  NEUTROABS 4.3  --   --   --   --   HGB 11.8* 11.9* 10.6* 12.6* 12.5*  HCT 31.6* 35.0* 31.6* 34.3* 34.4*  MCV 107.8*  --  103.6* 105.9* 103.9*  PLT 122*  --  PLATELET CLUMPS NOTED ON SMEAR, UNABLE TO ESTIMATE 128* 145*   Cardiac Enzymes: Recent Labs  Lab 05/24/19 1944  CKTOTAL 193   BNP (last 3 results) No results for input(s): PROBNP in the last 8760 hours. CBG: Recent Labs  Lab 05/26/19 1230 05/26/19 1613 05/26/19 2101 05/27/19 0605 05/27/19 1049  GLUCAP 170* 181* 172* 139* 272*   D-Dimer: No results for input(s): DDIMER in the last 72 hours. Hgb A1c: Recent Labs    05/24/19 2200  HGBA1C 8.5*   Lipid Profile: Recent Labs    05/26/19 0839  CHOL 81  HDL 28*  LDLCALC 36  TRIG 87  CHOLHDL 2.9   Thyroid function studies: No results for input(s): TSH, T4TOTAL, T3FREE, THYROIDAB in the last 72 hours.  Invalid input(s): FREET3 Anemia work up: No results for input(s): VITAMINB12, FOLATE, FERRITIN, TIBC, IRON, RETICCTPCT in the last 72 hours. Sepsis Labs: Recent Labs  Lab 05/24/19 1129 05/25/19 0516 05/26/19 0839 05/27/19 0651  WBC 5.5 3.1* 7.4 5.8   Microbiology Recent Results (from the past 240 hour(s))  SARS Coronavirus 2 by RT PCR (hospital order, performed in [facility] hospital lab) Nasopharyngeal Nasopharyngeal Swab     Status: None   Collection Time: 05/24/19 11:29 AM   Specimen: Nasopharyngeal Swab  Result Value Ref Range Status   SARS Coronavirus 2 NEGATIVE NEGATIVE Final    Comment: (NOTE) SARS-CoV-2 target nucleic acids are NOT DETECTED. The SARS-CoV-2 RNA is generally detectable in upper and lower respiratory specimens during the acute phase of infection. The lowest concentration of SARS-CoV-2 viral copies this assay can detect is 250 copies / mL. A negative result does not preclude SARS-CoV-2 infection and should not be used as the sole basis for treatment or other patient management decisions.  A negative result may occur  with improper specimen collection / handling, submission of specimen other  than nasopharyngeal swab, presence of viral mutation(s) within the areas targeted by this assay, and inadequate number of viral copies (<250 copies / mL). A negative result must be combined with clinical observations, patient history, and epidemiological information. Fact Sheet for Patients:   StrictlyIdeas.no Fact Sheet for Healthcare Providers: BankingDealers.co.za This test is not yet approved or cleared  by the [place] FDA and has been authorized for detection and/or diagnosis of SARS-CoV-2 by FDA under an Emergency Use Authorization (EUA).  This EUA will remain in effect (meaning this test can be used) for the duration of the COVID-19 declaration under Section 564(b)(1) of the Act, 21 U.S.C. section 360bbb-3(b)(1), unless the authorization is terminated or revoked sooner. Performed at [facility], [place] [address] [place], [place] [address]      Medications:   . aspirin EC  81 mg Oral Daily  . atorvastatin  40 mg Oral QHS  . clopidogrel  75 mg Oral Daily  . clotrimazole  1 application Topical BID  . feeding supplement (GLUCERNA SHAKE)  237 mL Oral TID BM  . fluticasone furoate-vilanterol  1 puff Inhalation Daily  . furosemide  40 mg Intravenous BID  . gabapentin  300 mg Oral q morning - 10a  . insulin aspart  0-9 Units Subcutaneous TID WC  . insulin detemir  5 Units Subcutaneous BID  . melatonin  6 mg Oral QHS  . metoprolol tartrate  25 mg Oral BID  . multivitamin with minerals  1 tablet Oral Daily  . pantoprazole  40 mg Oral BID  . sertraline  100 mg Oral Daily  . sodium chloride flush  3 mL Intravenous Q12H  . sodium zirconium cyclosilicate  5 g Oral Once  . tamsulosin  0.4 mg Oral QHS  . traZODone  50 mg Oral QHS   Continuous Infusions: . sodium chloride        LOS: 3 days   Domenic Polite  [facility]  05/27/2019,  11:52 AM

## 2019-05-27 NOTE — Plan of Care (Signed)
  Problem: Education: Goal: Knowledge of General Education information will improve Description: Including pain rating scale, medication(s)/side effects and non-pharmacologic comfort measures Outcome: Progressing   Problem: Health Behavior/Discharge Planning: Goal: Ability to manage health-related needs will improve Outcome: Progressing   Problem: Clinical Measurements: Goal: Ability to maintain clinical measurements within normal limits will improve Outcome: Progressing Goal: Will remain free from infection Outcome: Progressing Goal: Diagnostic test results will improve Outcome: Progressing Goal: Respiratory complications will improve Outcome: Progressing Goal: Cardiovascular complication will be avoided Outcome: Progressing   Problem: Activity: Goal: Risk for activity intolerance will decrease Outcome: Progressing   Problem: Nutrition: Goal: Adequate nutrition will be maintained Outcome: Progressing   Problem: Coping: Goal: Level of anxiety will decrease Outcome: Progressing   Problem: Elimination: Goal: Will not experience complications related to bowel motility Outcome: Progressing Goal: Will not experience complications related to urinary retention Outcome: Progressing   Problem: Pain Managment: Goal: General experience of comfort will improve Outcome: Progressing   Problem: Safety: Goal: Ability to remain free from injury will improve Outcome: Progressing   Problem: Skin Integrity: Goal: Risk for impaired skin integrity will decrease Outcome: Progressing   Problem: Education: Goal: Ability to describe self-care measures that may prevent or decrease complications (Diabetes Survival Skills Education) will improve Outcome: Progressing Goal: Individualized Educational Video(s) Outcome: Progressing   Problem: Coping: Goal: Ability to adjust to condition or change in health will improve Outcome: Progressing   Problem: Fluid Volume: Goal: Ability to  maintain a balanced intake and output will improve Outcome: Progressing   Problem: Health Behavior/Discharge Planning: Goal: Ability to identify and utilize available resources and services will improve Outcome: Progressing Goal: Ability to manage health-related needs will improve Outcome: Progressing   Problem: Metabolic: Goal: Ability to maintain appropriate glucose levels will improve Outcome: Progressing   Problem: Nutritional: Goal: Maintenance of adequate nutrition will improve Outcome: Progressing Goal: Progress toward achieving an optimal weight will improve Outcome: Progressing   Problem: Skin Integrity: Goal: Risk for impaired skin integrity will decrease Outcome: Progressing   Problem: Tissue Perfusion: Goal: Adequacy of tissue perfusion will improve Outcome: Progressing   Problem: Education: Goal: Ability to demonstrate management of disease process will improve Outcome: Progressing Goal: Ability to verbalize understanding of medication therapies will improve Outcome: Progressing Goal: Individualized Educational Video(s) Outcome: Progressing   Problem: Activity: Goal: Capacity to carry out activities will improve Outcome: Progressing   Problem: Cardiac: Goal: Ability to achieve and maintain adequate cardiopulmonary perfusion will improve Outcome: Progressing   

## 2019-05-27 NOTE — TOC Initial Note (Signed)
Transition of Care [facility]) - Initial/Assessment Note    Patient Details  Name: Adrian Andrews MRN: [id number] Date of Birth: 1950-11-06  Transition of Care [facility]) CM/SW Contact:    Alberteen Sam, LCSW Phone Number: 05/27/2019, 10:19 AM  Clinical Narrative:                  CSW spoke with patient at bedside, confirmed plan is to return to [facility]. Informed patient of PT/OT home health recommendation, patient is agreeable. Informed patient [facility] reports using [facility], patient is agreeable. Patient expressed concerns about getting a smaller oxygen tank. CSW has reached out to [place] with [employer] who will follow up on this and reach out to patient direclty to aid in setting him up with smaller tank due to portable being on back order right now. Other than requesting small tank, patient has plenty of O2 at home.    CSW spoke with Cassie with [facility] they are able to accept patient for home health PT and OT services. Will need home health PT and OT orders at time of discharge.   CSW spoke with Roselyn Reef at [place] [facility] to inform of potential dc tomorrow. Roselyn Reef states number for report is main number [phone] and ask to speak with Roselyn Reef. She request dc summary, fl2, and home health orders be faxed to [phone] prior to dc.     Expected Discharge Plan: Assisted Living([facility]) Barriers to Discharge: Continued Medical Work up   Patient Goals and CMS Choice Patient states their goals for this hospitalization and ongoing recovery are:: to go back to [employer].gov Compare Post Acute Care list provided to:: Patient Choice offered to / list presented to : Patient  Expected Discharge Plan and Services Expected Discharge Plan: Assisted Living([facility])       Living arrangements for the past 2 months: Assisted Living Facility([facility])                 DME Arranged: Oxygen DME Agency: [facility] Date DME Agency Contacted:  05/27/19 Time DME Agency Contacted: 18 Representative spoke with at DME Agency: [place]: PT, OT [place] Agency: [facility] Date [place]: 05/27/19 Time HH Agency Contacted: 1000 Representative spoke with at [place] Arrangements/Services Living arrangements for the past 2 months: Assisted Living Facility([facility]) Lives with:: Self Patient language and need for interpreter reviewed:: Yes Do you feel safe going back to the place where you live?: Yes      Need for Family Participation in Patient Care: Yes (Comment) Care giver support system in place?: Yes (comment)   Criminal Activity/Legal Involvement Pertinent to Current Situation/Hospitalization: No - Comment as needed  Activities of Daily Living      Permission Sought/Granted Permission sought to share information with : Customer service manager, Tourist information centre manager, Family Supports Permission granted to share information with : Yes, Verbal Permission Granted  Share Information with NAME: Mae  Permission granted to share info w AGENCY: [place] and home health agencies  Permission granted to share info w Relationship: friend  Permission granted to share info w Contact Information: [phone]  Emotional Assessment Appearance:: Appears stated age Attitude/Demeanor/Rapport: Gracious Affect (typically observed): Calm Orientation: : Oriented to Self, Oriented to Place, Oriented to  Time, Oriented to Situation Alcohol / Substance Use: Not Applicable Psych Involvement: No (comment)  Admission diagnosis:  CHF (congestive heart failure) (HCC) [I50.9] SOB (shortness of breath) [R06.02] Elevated troponin [  R77.8] AKI (acute kidney injury) ([place]) [N17.9] Patient Active Problem List   Diagnosis Date Noted  . CHF (congestive heart failure) ([place]) 05/24/2019  . SOB (shortness of breath)   . Elevated troponin   . Septic shock ([place])   . Acute on chronic diastolic CHF  (congestive heart failure) ([place]) 09/16/2018  . Hyperbilirubinemia 09/16/2018  . RLL pneumonia 09/10/2018  . Severe sepsis ([place]) 09/10/2018  . Essential hypertension 09/10/2018  . Dyslipidemia 09/10/2018  . COPD with acute exacerbation ([place]) 02/14/2018  . Nicotine use disorder 02/14/2018  . CKD (chronic kidney disease) stage 3, GFR 30-59 ml/min 02/14/2018  . COPD (chronic obstructive pulmonary disease) ([place]) 02/13/2018  . Lactic acidosis 02/13/2018  . Type 2 diabetes mellitus with other specified complication ([place]) [id number]  . Acute respiratory failure with hypoxia ([place]) 12/21/2017  . CAP (community acquired pneumonia) 12/21/2017  . AKI (acute kidney injury) ([place]) 12/21/2017  . COPD exacerbation ([place]) 12/20/2017  . Heart failure ([place]) 12/20/2017  . Carotid artery stenosis, symptomatic, left 02/21/2017   PCP:  [name], MD Pharmacy:   [place] - [id number] [place] ([place]) [place] [address] Phone: [phone] Fax: [phone]     Social Determinants of Health (SDOH) Interventions    Readmission Risk Interventions Readmission Risk Prevention Plan 09/11/2018  Transportation Screening Complete  HRI or Home Care Consult Complete  Some recent data might be hidden

## 2019-05-28 DIAGNOSIS — N170 Acute kidney failure with tubular necrosis: Secondary | ICD-10-CM

## 2019-05-28 DIAGNOSIS — N182 Chronic kidney disease, stage 2 (mild): Secondary | ICD-10-CM

## 2019-05-28 DIAGNOSIS — E875 Hyperkalemia: Secondary | ICD-10-CM

## 2019-05-28 LAB — GLUCOSE, CAPILLARY
Glucose-Capillary: 174 mg/dL — ABNORMAL HIGH (ref 70–99)
Glucose-Capillary: 192 mg/dL — ABNORMAL HIGH (ref 70–99)
Glucose-Capillary: 287 mg/dL — ABNORMAL HIGH (ref 70–99)
Glucose-Capillary: 294 mg/dL — ABNORMAL HIGH (ref 70–99)
Glucose-Capillary: 314 mg/dL — ABNORMAL HIGH (ref 70–99)

## 2019-05-28 LAB — BASIC METABOLIC PANEL
Anion gap: 11 (ref 5–15)
BUN: 33 mg/dL — ABNORMAL HIGH (ref 8–23)
CO2: 28 mmol/L (ref 22–32)
Calcium: 8.8 mg/dL — ABNORMAL LOW (ref 8.9–10.3)
Chloride: 96 mmol/L — ABNORMAL LOW (ref 98–111)
Creatinine, Ser: 1.41 mg/dL — ABNORMAL HIGH (ref 0.61–1.24)
GFR calc Af Amer: 58 mL/min — ABNORMAL LOW (ref >60)
GFR calc non Af Amer: 50 mL/min — ABNORMAL LOW (ref >60)
Glucose, Bld: 192 mg/dL — ABNORMAL HIGH (ref 70–99)
Potassium: 3.9 mmol/L (ref 3.5–5.1)
Sodium: 135 mmol/L (ref 135–145)

## 2019-05-28 MED ORDER — POTASSIUM CHLORIDE CRYS ER 20 MEQ PO TBCR
40.0000 meq | EXTENDED_RELEASE_TABLET | Freq: Once | ORAL | Status: AC
  Administered 2019-05-28: 40 meq via ORAL
  Filled 2019-05-28: qty 2

## 2019-05-28 MED ORDER — INSULIN ASPART 100 UNIT/ML ~~LOC~~ SOLN
3.0000 [IU] | Freq: Three times a day (TID) | SUBCUTANEOUS | Status: DC
  Administered 2019-05-28 – 2019-05-29 (×3): 3 [IU] via SUBCUTANEOUS

## 2019-05-28 MED ORDER — GUAIFENESIN-DM 100-10 MG/5ML PO SYRP
5.0000 mL | ORAL_SOLUTION | ORAL | Status: DC | PRN
  Administered 2019-05-28: 5 mL via ORAL
  Filled 2019-05-28 (×2): qty 5

## 2019-05-28 MED ORDER — FUROSEMIDE 80 MG PO TABS
80.0000 mg | ORAL_TABLET | Freq: Every day | ORAL | Status: DC
  Administered 2019-05-29 – 2019-05-31 (×3): 80 mg via ORAL
  Filled 2019-05-28 (×3): qty 1

## 2019-05-28 NOTE — Progress Notes (Signed)
Inpatient Diabetes Program Recommendations  AACE/ADA: New Consensus Statement on Inpatient Glycemic Control   Target Ranges:  Prepandial:   less than 140 mg/dL      Peak postprandial:   less than 180 mg/dL (1-2 hours)      Critically ill patients:  140 - 180 mg/dL   Results for Adrian Andrews, Adrian Andrews (MRN [id number]) as of 05/28/2019 11:31  Ref. Range 05/27/2019 06:05 05/27/2019 10:49 05/27/2019 16:01 05/27/2019 20:47 05/28/2019 00:03 05/28/2019 06:14 05/28/2019 11:11  Glucose-Capillary Latest Ref Range: 70 - 99 mg/dL 139 (H) 272 (H) 188 (H) 337 (H) 287 (H) 174 (H) 294 (H)   Review of Glycemic Control  Diabetes history: DM2 Outpatient Diabetes medications: Glipizide 10 mg BID, Actos 15 mg daily Current orders for Inpatient glycemic control: Levemir 5 units BID, Novolog 0-9 units TID with meals  Inpatient Diabetes Program Recommendations:   Insulin-Meal Coverage: Please consider ordering Novolog 3 units TID with meals for meal coverage if patient eats at least 50% of meals.  Insulin-Correction: Please consider ordering Novolog 0-5 units QHS for bedtime correction.  Outpatient DM medications: Would recommend discontinuing Actos at time of discharge due to side effect of fluid retention and with noted CHF. Could consider adding additional oral DM medication such as Tradjenta 5 mg daily and have patient follow up with PCP regarding DM.  Thanks, Barnie Alderman, RN, MSN, CDE Diabetes Coordinator Inpatient Diabetes Program [phone] (Team Pager from 8am to 5pm)

## 2019-05-28 NOTE — TOC Progression Note (Addendum)
Transition of Care [facility]) - Progression Note    Patient Details  Name: Adrian Andrews MRN: [id number] Date of Birth: 04/21/1950  Transition of Care [facility]) CM/SW [place], RN Phone Number: 05/28/2019, 2:46 PM  Clinical Narrative:    Improving clinically, working on ambulation and oxygenation with PT and OT.Planning for discharge tomorrow. Dr. Broadus John writing face to face orders for Home health. [facility] aware of orders. See CSW note for information of where to call report  And fax information Expected Discharge Plan: Assisted Living([facility]) Barriers to Discharge: Continued Medical Work up  Expected Discharge Plan and Services Expected Discharge Plan: Assisted Living([facility])       Living arrangements for the past 2 months: Assisted Living Facility([facility])                 DME Arranged: Oxygen DME Agency: [employer] Date DME Agency Contacted: 05/27/19 Time DME Agency Contacted: 95 Representative spoke with at DME Agency: [place] HH Arranged: PT, OT HH Agency: [facility] [place] Date [place]: 05/27/19 Time [place]: 1000 Representative spoke with at [place]: Cassie   Social Determinants of Health ([place]) Interventions    Readmission Risk Interventions Readmission Risk Prevention Plan 09/11/2018  Transportation Screening Complete  HRI or Home Care Consult Complete  Some recent data might be hidden

## 2019-05-28 NOTE — Progress Notes (Addendum)
Occupational Therapy Treatment Patient Details Name: Adrian Andrews MRN: [id number] DOB: 1950-09-06 Today's Date: 05/28/2019    History of present illness 69 y.o. male with PMH of HTN, HLD, carotid artery disease s/p L CEA, COPD, CVA, DM II and former tobacco use who presented dyspnea and elevated troponin. He was previously admitted in Sep 2020 with RLL PNA and elevated trop. Myoview at the time was low risk however showed moderate sized inferior wall defect suspected to be infarct with minimal ischemia   OT comments  Pt making good progress with functional goals. Pt coming from bathroom and going to sink upon arrival. Pt able to stand ay sink for hygiene/grooming and bathing with sup. Pt able to recall energy conservation techniques during ADLs, ADL mobility and for home use for safety. Pt states that he feels better and hopes to go home tomorrow.   Follow Up Recommendations  Home health OT    Equipment Recommendations  None recommended by OT    Recommendations for Other Services      Precautions / Restrictions Precautions Precautions: Fall Restrictions Weight Bearing Restrictions: No       Mobility Bed Mobility Overal bed mobility: Independent                Transfers Overall transfer level: Needs assistance Equipment used: None Transfers: Sit to/from Stand Sit to Stand: Supervision         General transfer comment: pt coming from bathroom and going ot sink upon OT arrival    Balance Overall balance assessment: Needs assistance Sitting-balance support: No upper extremity supported;Feet supported Sitting balance-Leahy Scale: Good     Standing balance support: No upper extremity supported;During functional activity Standing balance-Leahy Scale: Fair Standing balance comment: pt coming from bathroom and going to sink upon arrival. Pt able to stand ay sink for hygiene/grooming and bathing with sup                           ADL either performed or  assessed with clinical judgement   ADL                                               Vision Baseline Vision/History: No visual deficits Patient Visual Report: No change from baseline     Perception     Praxis      Cognition Arousal/Alertness: Awake/alert Behavior During Therapy: WFL for tasks assessed/performed Overall Cognitive Status: Within Functional Limits for tasks assessed                                          Exercises     Shoulder Instructions       General Comments      Pertinent Vitals/ Pain       Pain Assessment: No/denies pain Pain Intervention(s): Monitored during session  Home Living                                          Prior Functioning/Environment              Frequency  Min 2X/week        Progress  Toward Goals  OT Goals(current goals can now be found in the care plan section)  Progress towards OT goals: Progressing toward goals  Acute Rehab OT Goals Patient Stated Goal: to go home  Plan Discharge plan remains appropriate    Co-evaluation                 AM-PAC OT "6 Clicks" Daily Activity     Outcome Measure   Help from another person eating meals?: None Help from another person taking care of personal grooming?: None Help from another person toileting, which includes using toliet, bedpan, or urinal?: A Little Help from another person bathing (including washing, rinsing, drying)?: A Little Help from another person to put on and taking off regular upper body clothing?: None Help from another person to put on and taking off regular lower body clothing?: A Little 6 Click Score: 21    End of Session Equipment Utilized During Treatment: Oxygen  OT Visit Diagnosis: Unsteadiness on feet (R26.81);Muscle weakness (generalized) (M62.81)   Activity Tolerance Patient tolerated treatment well   Patient Left with call bell/phone within reach;in chair   Nurse  Communication          Time: [id number] OT Time Calculation (min): 18 min  Charges: OT General Charges $OT Visit: 1 Visit OT Treatments $Self Care/Home Management : 8-22 mins     Emmit Alexanders [facility] 05/28/2019, 11:06 AM

## 2019-05-28 NOTE — Progress Notes (Addendum)
Physical Therapy Treatment Patient Details Name: Adrian Andrews MRN: [id number] DOB: 1950/04/07 Today's Date: 05/28/2019    History of Present Illness 69 y.o. male with PMH of HTN, HLD, carotid artery disease s/p L CEA, COPD, CVA, DM II and former tobacco use who presented dyspnea and elevated troponin. He was previously admitted in Sep 2020 with RLL PNA and elevated trop. Myoview at the time was low risk however showed moderate sized inferior wall defect suspected to be infarct with minimal ischemia    PT Comments    Pt admitted with above diagnosis. Pt was able to ambulate without device with min guard assist limited by DOE 3/4 as well as desaturation with activity. Pt educated on pursed lip breathing and use of incentive spirometer as well as energy conservation.  Desats as low as 80% on 8 L with activity and incr time to recover to 85% with 4L.  Pt states his baseline was 85%.  Will follow acutely and progress as able.   Pt currently with functional limitations due to balance and endurance deficits. Pt will benefit from skilled PT to increase their independence and safety with mobility to allow discharge to the venue listed below.     Follow Up Recommendations  Home health PT;Supervision - Intermittent     Equipment Recommendations  Other (comment)(portable O2 tank)    Recommendations for Other Services       Precautions / Restrictions Precautions Precautions: Fall Precaution Comments: Watch O2 sats Restrictions Weight Bearing Restrictions: No    Mobility  Bed Mobility Overal bed mobility: Independent                Transfers Overall transfer level: Needs assistance Equipment used: None Transfers: Sit to/from Stand Sit to Stand: Supervision         General transfer comment: pt coming from bathroom and going ot sink upon OT arrival  Ambulation/Gait Ambulation/Gait assistance: Min guard Gait Distance (Feet): 110 Feet Assistive device: None Gait  Pattern/deviations: Step-through pattern Gait velocity: Decreased Gait velocity interpretation: <1.31 ft/sec, indicative of household ambulator General Gait Details: Pt able to walk without device.  O2 on 4L droppng therefore had to put pt on 8 L to incr sats to low 80's.  upon return to room, oxygen sats took at least 5 min to return to 85% which pt states is his baseline much of the time. Had pt work on pursed lip breathing as well as incentive spirometer.  Required prolonged seated rest and pursed lip breathing with exertion. Educated about importance of activity pacing and rest breaks at home. O2 back on 4L at rest.    Stairs             Wheelchair Mobility    Modified Rankin (Stroke Patients Only)       Balance Overall balance assessment: Needs assistance Sitting-balance support: No upper extremity supported;Feet supported Sitting balance-Leahy Scale: Good     Standing balance support: No upper extremity supported;During functional activity Standing balance-Leahy Scale: Fair Standing balance comment: pt coming from bathroom and going to sink upon arrival. Pt able to stand ay sink for hygiene/grooming and bathing with sup                            Cognition Arousal/Alertness: Awake/alert Behavior During Therapy: WFL for tasks assessed/performed Overall Cognitive Status: Within Functional Limits for tasks assessed  Exercises General Exercises - Lower Extremity Long Arc Quad: AROM;Both;10 reps;Seated Hip Flexion/Marching: AROM;Both;15 reps;Standing    General Comments        Pertinent Vitals/Pain Pain Assessment: No/denies pain Pain Intervention(s): Monitored during session    Home Living                      Prior Function            PT Goals (current goals can now be found in the care plan section) Acute Rehab PT Goals Patient Stated Goal: to go home Progress towards PT goals:  Progressing toward goals    Frequency    Min 3X/week      PT Plan Current plan remains appropriate    Co-evaluation              AM-PAC PT "6 Clicks" Mobility   Outcome Measure  Help needed turning from your back to your side while in a flat bed without using bedrails?: None Help needed moving from lying on your back to sitting on the side of a flat bed without using bedrails?: None Help needed moving to and from a bed to a chair (including a wheelchair)?: A Little Help needed standing up from a chair using your arms (e.g., wheelchair or bedside chair)?: A Little Help needed to walk in hospital room?: A Little Help needed climbing 3-5 steps with a railing? : A Little 6 Click Score: 20    End of Session Equipment Utilized During Treatment: Gait belt;Oxygen Activity Tolerance: Treatment limited secondary to medical complications (Comment)(decreased oxygen sats) Patient left: with call bell/phone within reach;in chair;with chair alarm set Nurse Communication: Mobility status PT Visit Diagnosis: Unsteadiness on feet (R26.81);Muscle weakness (generalized) (M62.81)     Time: 1047-1100 PT Time Calculation (min) (ACUTE ONLY): 13 min  Charges:  $Gait Training: 8-22 mins                     Zasha Belleau W,PT [place] Pager:  [phone]  Office:  [place] 05/28/2019, 1:05 PM

## 2019-05-28 NOTE — Care Management Important Message (Signed)
Important Message  Patient Details  Name: Adrian Andrews MRN: [id number] Date of Birth: [birth year]   Medicare Important Message Given:  Yes     Shelda Altes 05/28/2019, 9:56 AM

## 2019-05-28 NOTE — Progress Notes (Signed)
[facility] PROGRESS NOTE    Progress Note  Adrian Andrews  [id number] DOB: [birth year] DOA: 05/24/2019 PCP: [name], MD    Brief Narrative:   Adrian Andrews is an 69 y.o. male past medical history significant of chronic respiratory failure secondary COPD oxygen dependent, insulin-dependent diabetes mellitus, status post left carotid endarterectomy, history of CVA essential hypertension presented to ED with 4 days of dyspnea. In the ED was found to be hypoxic, CXR noted CHF, Troponins 1400  Assessment/Plan:   Acute respiratory failure with hypoxia  Acute diastolic CHF Non-ST elevation MI, elevated troponin -Admitted with fluid overload, elevated high-sensitivity troponins -Cardiology consulted, diuresed with IV Lasix -Volume status is improving, weight down 6 kg, -I/O is inaccurate -Cardiology following, changed to p.o. Lasix, will defer to cardiology  -patient declined left heart catheterization-plan for medical management -Started on Plavix, continue aspirin metoprolol and statin  Hyperkalemia -May be from renal insufficiency, -Treated with Lokelma yesterday -Improved, monitor  Acute kidney injury on chronic kidney disease stage II: His baseline creatinine is around 1 on admission 1.8, improving with diuresis. -Likely cardiorenal, monitor  Mild hypervolemic hyponatremia: -Resolved  Type 2 diabetes mellitus with other specified complication (HCC) Hemoglobin A1c of 8.5, agree with stopping Actos. -CBGs poorly controlled, add NovoLog with meals, continue Lantus  Essential hypertension: -Stable, continue metoprolol and Lasix  DVT prophylaxis: lovenox  CODE STATUS: Family Communication: Discussed with patient in detail, no family Status is: Inpatient Remains inpatient appropriate because: Pending improvement in volume status, CHF  Dispo: The patient is from: Home              Anticipated d/c is to: Assisted living              Anticipated d/c date  is: > 1-2 days              Patient currently is not medically stable to d/c.  Medical Consultants:    Cardiology  Subjective:   -Does not feel too well today, complains of low back pain, still with some dyspnea on exertion, swelling is improving Objective:    Vitals:   05/28/19 0523 05/28/19 0820 05/28/19 0821 05/28/19 1048  BP: (!) 115/58 (!) 100/47  (!) 95/55  Pulse: 99 (!) 104  98  Resp: 18 18  20   Temp: 98 F (36.7 C) 97.7 F (36.5 C)    TempSrc: Oral Oral    SpO2: (!) 85% 91% 92% 91%  Weight:      Height:       SpO2: 91 % O2 Flow Rate (L/min): 4 L/min   Intake/Output Summary (Last 24 hours) at 05/28/2019 1153 Last data filed at 05/28/2019 0826 Gross per 24 hour  Intake 480 ml  Output --  Net 480 ml   Filed Weights   05/26/19 0538 05/27/19 0332 05/28/19 0520  Weight: 80.3 kg 78.5 kg 75.8 kg    Exam General, pleasant elderly male sitting up in bed, AAOx3 HEENT, unable to assess JVD CVS S1-S2 regular rate rhythm Lungs rare basilar rhonchi, otherwise clear Extremities with trace ankle edema Skin: No rashes on exposed skin Psychiatry:  mood and affect seem appropriate.   Data Reviewed:    Labs: Basic Metabolic Panel: Recent Labs  Lab 05/24/19 1129 05/24/19 1129 05/24/19 2246 05/24/19 2246 05/25/19 0516 05/25/19 0516 05/26/19 [id number] 05/26/19 0839 05/27/19 0651 05/28/19 0624  NA 134*   < > 132*  --  133*  --  135  --  138 135  K  5.5*   < > 5.5*   < > 5.0   < > 4.9   < > 5.5* 3.9  CL 105  --   --   --  101  --  100  --  98 96*  CO2 19*  --   --   --  19*  --  24  --  29 28  GLUCOSE 224*  --   --   --  283*  --  160*  --  155* 192*  BUN 41*  --   --   --  41*  --  42*  --  37* 33*  CREATININE 1.85*  --   --   --  1.55*  --  1.69*  --  1.59* 1.41*  CALCIUM 9.0  --   --   --  8.8*  --  9.7  --  9.5 8.8*   < > = values in this interval not displayed.   GFR Estimated Creatinine Clearance: 48.7 mL/min (A) (by C-G formula based on SCr of 1.41 mg/dL  (H)). Liver Function Tests: Recent Labs  Lab 05/24/19 1129  AST 26  ALT 21  ALKPHOS 67  BILITOT 1.1  PROT 6.1*  ALBUMIN 3.4*   No results for input(s): LIPASE, AMYLASE in the last 168 hours. No results for input(s): AMMONIA in the last 168 hours. Coagulation profile No results for input(s): INR, PROTIME in the last 168 hours. COVID-19 Labs  No results for input(s): DDIMER, FERRITIN, LDH, CRP in the last 72 hours.  Lab Results  Component Value Date   SARSCOV2NAA NEGATIVE 05/24/2019   [place] NEGATIVE 09/28/2018   [place] NEGATIVE 09/13/2018   [place] NEGATIVE 09/10/2018    CBC: Recent Labs  Lab 05/24/19 1129 05/24/19 2246 05/25/19 0516 05/26/19 0839 05/27/19 0651  WBC 5.5  --  3.1* 7.4 5.8  NEUTROABS 4.3  --   --   --   --   HGB 11.8* 11.9* 10.6* 12.6* 12.5*  HCT 31.6* 35.0* 31.6* 34.3* 34.4*  MCV 107.8*  --  103.6* 105.9* 103.9*  PLT 122*  --  PLATELET CLUMPS NOTED ON SMEAR, UNABLE TO ESTIMATE 128* 145*   Cardiac Enzymes: Recent Labs  Lab 05/24/19 1944  CKTOTAL 193   BNP (last 3 results) No results for input(s): PROBNP in the last 8760 hours. CBG: Recent Labs  Lab 05/27/19 1601 05/27/19 2047 05/28/19 0003 05/28/19 0614 05/28/19 1111  GLUCAP 188* 337* 287* 174* 294*   D-Dimer: No results for input(s): DDIMER in the last 72 hours. Hgb A1c: No results for input(s): HGBA1C in the last 72 hours. Lipid Profile: Recent Labs    05/26/19 0839  CHOL 81  HDL 28*  LDLCALC 36  TRIG 87  CHOLHDL 2.9   Thyroid function studies: No results for input(s): TSH, T4TOTAL, T3FREE, THYROIDAB in the last 72 hours.  Invalid input(s): FREET3 Anemia work up: No results for input(s): VITAMINB12, FOLATE, FERRITIN, TIBC, IRON, RETICCTPCT in the last 72 hours. Sepsis Labs: Recent Labs  Lab 05/24/19 1129 05/25/19 0516 05/26/19 0839 05/27/19 0651  WBC 5.5 3.1* 7.4 5.8   Microbiology Recent Results (from the past 240 hour(s))  SARS Coronavirus 2 by  RT PCR (hospital order, performed in [facility] hospital lab) Nasopharyngeal Nasopharyngeal Swab     Status: None   Collection Time: 05/24/19 11:29 AM   Specimen: Nasopharyngeal Swab  Result Value Ref Range Status   SARS Coronavirus 2 NEGATIVE NEGATIVE Final    Comment: (NOTE) SARS-CoV-2 target  nucleic acids are NOT DETECTED. The SARS-CoV-2 RNA is generally detectable in upper and lower respiratory specimens during the acute phase of infection. The lowest concentration of SARS-CoV-2 viral copies this assay can detect is 250 copies / mL. A negative result does not preclude SARS-CoV-2 infection and should not be used as the sole basis for treatment or other patient management decisions.  A negative result may occur with improper specimen collection / handling, submission of specimen other than nasopharyngeal swab, presence of viral mutation(s) within the areas targeted by this assay, and inadequate number of viral copies (<250 copies / mL). A negative result must be combined with clinical observations, patient history, and epidemiological information. Fact Sheet for Patients:   StrictlyIdeas.no Fact Sheet for Healthcare Providers: BankingDealers.co.za This test is not yet approved or cleared  by the [place] FDA and has been authorized for detection and/or diagnosis of SARS-CoV-2 by FDA under an Emergency Use Authorization (EUA).  This EUA will remain in effect (meaning this test can be used) for the duration of the COVID-19 declaration under Section 564(b)(1) of the Act, 21 U.S.C. section 360bbb-3(b)(1), unless the authorization is terminated or revoked sooner. Performed at [facility], [place] [address], [place], [place] [address]      Medications:   . aspirin EC  81 mg Oral Daily  . atorvastatin  40 mg Oral QHS  . clopidogrel  75 mg Oral Daily  . clotrimazole  1 application Topical BID  . feeding supplement (GLUCERNA  SHAKE)  237 mL Oral TID BM  . fluticasone furoate-vilanterol  1 puff Inhalation Daily  . furosemide  40 mg Intravenous BID  . gabapentin  300 mg Oral q morning - 10a  . insulin aspart  0-9 Units Subcutaneous TID WC  . insulin aspart  3 Units Subcutaneous TID WC  . insulin detemir  5 Units Subcutaneous BID  . melatonin  6 mg Oral QHS  . metoprolol tartrate  25 mg Oral BID  . multivitamin with minerals  1 tablet Oral Daily  . pantoprazole  40 mg Oral BID  . sertraline  100 mg Oral Daily  . sodium chloride flush  3 mL Intravenous Q12H  . tamsulosin  0.4 mg Oral QHS  . traZODone  50 mg Oral QHS   Continuous Infusions: . sodium chloride        LOS: 4 days   Domenic Polite  [facility]  05/28/2019, 11:53 AM

## 2019-05-28 NOTE — Progress Notes (Signed)
Called to bedside by nurse. Nurse stated patient has WOB and SOB. Patient is fluid overloaded and needs Lasix and Mucinex for congestion. Patient states he takes the Mucinex at home for the congestion. RT called nurse and asked for an order to be added on to help the patient.

## 2019-05-28 NOTE — Progress Notes (Addendum)
Progress Note  Patient Name: Adrian Andrews Date of Encounter: 05/28/2019  Primary Cardiologist: Minus Breeding, MD   Subjective   Breathing and lower extremity edema improving.  No recurrent chest pain.  Sinus tachycardic with ambulation.  Inpatient Medications    Scheduled Meds: . aspirin EC  81 mg Oral Daily  . atorvastatin  40 mg Oral QHS  . clopidogrel  75 mg Oral Daily  . clotrimazole  1 application Topical BID  . feeding supplement (GLUCERNA SHAKE)  237 mL Oral TID BM  . fluticasone furoate-vilanterol  1 puff Inhalation Daily  . [START ON 05/29/2019] furosemide  80 mg Oral Daily  . gabapentin  300 mg Oral q morning - 10a  . insulin aspart  0-9 Units Subcutaneous TID WC  . insulin aspart  3 Units Subcutaneous TID WC  . insulin detemir  5 Units Subcutaneous BID  . melatonin  6 mg Oral QHS  . metoprolol tartrate  25 mg Oral BID  . multivitamin with minerals  1 tablet Oral Daily  . pantoprazole  40 mg Oral BID  . sertraline  100 mg Oral Daily  . sodium chloride flush  3 mL Intravenous Q12H  . tamsulosin  0.4 mg Oral QHS  . traZODone  50 mg Oral QHS   Continuous Infusions: . sodium chloride     PRN Meds: sodium chloride, acetaminophen, albuterol, calcium carbonate, guaiFENesin-dextromethorphan, HYDROcodone-acetaminophen, ondansetron (ZOFRAN) IV, simethicone, sodium chloride, sodium chloride flush, traMADol   Vital Signs    Vitals:   05/28/19 1209 05/28/19 1644 05/28/19 2040 05/28/19 2130  BP: 112/66 129/62 117/63   Pulse: 87 84 (!) 105 94  Resp: 20 18 20 20   Temp: 97.8 F (36.6 C) 98.1 F (36.7 C) 99.3 F (37.4 C)   TempSrc: Oral Oral Oral   SpO2: 93% 94% 91% 91%  Weight:      Height:        Intake/Output Summary (Last 24 hours) at 05/28/2019 2235 Last data filed at 05/28/2019 1700 Gross per 24 hour  Intake 480 ml  Output --  Net 480 ml   Last 3 Weights 05/28/2019 05/27/2019 05/26/2019  Weight (lbs) 167 lb 3.2 oz 173 lb 177 lb  Weight (kg) 75.841 kg  78.472 kg 80.287 kg      Telemetry    Sinus tachycardia at 100s ambulation this morning- Personally Reviewed  ECG    No new tracing  Physical Exam   GEN: No acute distress.   Neck:  Elevated JVD Cardiac: regular tachycardiac, 1/6 SE murmurs, rubs, or gallops.  Respiratory: Clear to auscultation bilaterally. GI: Soft, nontender, non-distended  MS: trace to 1 + LE edema R > L ; No deformity. Neuro:  Nonfocal  Psych: Normal affect   Labs    High Sensitivity Troponin:   Recent Labs  Lab 05/24/19 1129 05/24/19 1500 05/25/19 0516  TROPONINIHS 1,480* 1,743* 668*      Chemistry Recent Labs  Lab 05/24/19 1129 05/24/19 2246 05/26/19 0839 05/27/19 0651 05/28/19 0624  NA 134*   < > 135 138 135  K 5.5*   < > 4.9 5.5* 3.9  CL 105   < > 100 98 96*  CO2 19*   < > 24 29 28   GLUCOSE 224*   < > 160* 155* 192*  BUN 41*   < > 42* 37* 33*  CREATININE 1.85*   < > 1.69* 1.59* 1.41*  CALCIUM 9.0   < > 9.7 9.5 8.8*  PROT 6.1*  --   --   --   --  ALBUMIN 3.4*  --   --   --   --   AST 26  --   --   --   --   ALT 21  --   --   --   --   ALKPHOS 67  --   --   --   --   BILITOT 1.1  --   --   --   --   GFRNONAA 36*   < > 40* 43* 50*  GFRAA 42*   < > 47* 50* 58*  ANIONGAP 10   < > 11 11 11    < > = values in this interval not displayed.     Hematology Recent Labs  Lab 05/25/19 0516 05/26/19 0839 05/27/19 0651  WBC 3.1* 7.4 5.8  RBC 3.05* 3.24* 3.31*  HGB 10.6* 12.6* 12.5*  HCT 31.6* 34.3* 34.4*  MCV 103.6* 105.9* 103.9*  MCH 34.8* 38.9* 37.8*  MCHC 33.5 36.7* 36.3*  RDW 14.9 16.9* 15.9*  PLT PLATELET CLUMPS NOTED ON SMEAR, UNABLE TO ESTIMATE 128* 145*    BNP Recent Labs  Lab 05/24/19 1129  BNP 1,733.6*       Radiology    No results found.  Cardiac Studies   Echo 05/25/19  IMPRESSIONS  1. Left ventricular ejection fraction, by estimation, is 55 to 60%. The left ventricle has normal function. The left ventricle has no regional wall motion abnormalities. Left  ventricular diastolic parameters are consistent with Grade II diastolic dysfunction (pseudonormalization). Elevated left atrial pressure.  2. Right ventricular systolic function is mildly reduced. The right ventricular size is mildly enlarged. There is mildly elevated pulmonary artery systolic pressure. The estimated right ventricular systolic pressure is 99991111 mmHg.  3. The mitral valve is normal in structure. No evidence of mitral valve regurgitation. No evidence of mitral stenosis.  4. The aortic valve is tricuspid. Aortic valve regurgitation is not visualized. Mild aortic valve sclerosis is present, with no evidence of aortic valve stenosis.  5. The inferior vena cava is dilated in size with <50% respiratory variability, suggesting right atrial pressure of 15 mmHg. Comparison(s): No significant change from prior study. Prior images reviewed side by side.   Patient Profile   69 y.o.malewith PMH of HTN, HLD, carotid artery disease s/p L CEA, COPD, CVA, DM II and former tobacco use who presented dyspnea and elevated troponin. He was previously admitted in Sep 2020 with RLL PNA and elevated trop. Myoview at the time was low risk however showed moderate sized inferior wall defect suspected to be infarct with minimal ischemia.  Assessment & Plan    1.  Acute diastolic heart failure -BNP greater than 1700.  Echocardiogram showed LV function of 55 to 60% and grade 2 diastolic dysfunction.  I & O inaccurate. Weight down 12 lb 179>>167lb ( 5lb in past 24 hours).  - Volume status improving but mild volume overload on exam.  -Continue IV diuresis>>> likely change to PO this evening or AM.  BUN/creatinine improving 33/1.41 today.  -Continue beta-blocker - if BP tolerates, would titrate further to 50mg  BID on d/c.  With potassium greater than 5, will be reluctant to add ACE inhibitor or ARB.  2.  Hyperkalemia -Resolved  -Did not got Lokelma   3. Elevated troponin  -High-sensitivity troponin peaked  at 1743.  No regional wall motion abnormality on echocardiogram. -Patient declined cardiac catheterization - Treated with heparin for ~48 hours.  -Plan to treat medically with aspirin, Plavix and statin.  4. HTN -  BP stable on BB  5. Sinus tachycardia - With movement this morning - Follow - Continue lopressor 25mg  BId >> may consider increase at discharge to 50mg  BID   Likely home tomorrow AM. Will arrange follow up.   For questions or updates, please contact [place] Please consult [email or web address] for contact info under        Signed, Leanor Kail, [place]  Physician Assistant  Cardiology   05/28/2019, 10:35 PM     ATTENDING ATTESTATION  I have seen, examined and evaluated the patient this AM along with Mr. Curly [place], [place].  After reviewing all the available data and chart, we discussed the patients laboratory, study & physical findings as well as symptoms in detail. I agree with his findings, examination as well as impression recommendations as per our discussion.    Attending adjustments noted in italics.   Overall doing well.  Diuresing to the point where now he seems to have minimal edema.  Tachycardia with walking could be indication of mild overdiuresis.  We will stop IV Lasix for now and start 80 mg Lasix p.o. daily.  Would increase beta-blocker dose to 50 mg twice daily tomorrow.-But will defer to rounding team.  Anticipated that should be ready for discharge probably tomorrow.  We have arranged outpatient follow-up.  Glenetta Hew, M.D., M.S. Interventional Cardiologist   Pager # [phone] Phone # [phone] [address] [place], [place] [address]

## 2019-05-29 DIAGNOSIS — I130 Hypertensive heart and chronic kidney disease with heart failure and stage 1 through stage 4 chronic kidney disease, or unspecified chronic kidney disease: Secondary | ICD-10-CM

## 2019-05-29 LAB — GLUCOSE, CAPILLARY
Glucose-Capillary: 235 mg/dL — ABNORMAL HIGH (ref 70–99)
Glucose-Capillary: 304 mg/dL — ABNORMAL HIGH (ref 70–99)
Glucose-Capillary: 397 mg/dL — ABNORMAL HIGH (ref 70–99)
Glucose-Capillary: 318 mg/dL — ABNORMAL HIGH (ref 70–99)

## 2019-05-29 LAB — BASIC METABOLIC PANEL
Anion gap: 11 (ref 5–15)
BUN: 37 mg/dL — ABNORMAL HIGH (ref 8–23)
CO2: 29 mmol/L (ref 22–32)
Calcium: 8.8 mg/dL — ABNORMAL LOW (ref 8.9–10.3)
Chloride: 94 mmol/L — ABNORMAL LOW (ref 98–111)
Creatinine, Ser: 1.48 mg/dL — ABNORMAL HIGH (ref 0.61–1.24)
GFR calc Af Amer: 55 mL/min — ABNORMAL LOW (ref >60)
GFR calc non Af Amer: 47 mL/min — ABNORMAL LOW (ref >60)
Glucose, Bld: 267 mg/dL — ABNORMAL HIGH (ref 70–99)
Potassium: 4.2 mmol/L (ref 3.5–5.1)
Sodium: 134 mmol/L — ABNORMAL LOW (ref 135–145)

## 2019-05-29 MED ORDER — IPRATROPIUM-ALBUTEROL 0.5-2.5 (3) MG/3ML IN SOLN
3.0000 mL | Freq: Three times a day (TID) | RESPIRATORY_TRACT | Status: DC
  Administered 2019-05-29: 3 mL via RESPIRATORY_TRACT
  Filled 2019-05-29 (×2): qty 3

## 2019-05-29 MED ORDER — IPRATROPIUM-ALBUTEROL 0.5-2.5 (3) MG/3ML IN SOLN: 3.0000 mL | Freq: Four times a day (QID) | RESPIRATORY_TRACT | Status: DC

## 2019-05-29 MED ORDER — LOPERAMIDE HCL 2 MG PO CAPS
4.0000 mg | ORAL_CAPSULE | Freq: Once | ORAL | Status: AC
  Administered 2019-05-29: 4 mg via ORAL
  Filled 2019-05-29: qty 2

## 2019-05-29 MED ORDER — INSULIN ASPART 100 UNIT/ML ~~LOC~~ SOLN
5.0000 [IU] | Freq: Three times a day (TID) | SUBCUTANEOUS | Status: DC
  Administered 2019-05-29 – 2019-05-31 (×7): 5 [IU] via SUBCUTANEOUS

## 2019-05-29 MED ORDER — GUAIFENESIN ER 600 MG PO TB12
600.0000 mg | ORAL_TABLET | Freq: Two times a day (BID) | ORAL | Status: DC
  Administered 2019-05-29 – 2019-05-31 (×5): 600 mg via ORAL
  Filled 2019-05-29 (×5): qty 1

## 2019-05-29 MED ORDER — LEVALBUTEROL HCL 0.63 MG/3ML IN NEBU
0.6300 mg | INHALATION_SOLUTION | Freq: Three times a day (TID) | RESPIRATORY_TRACT | Status: DC
  Administered 2019-05-30 (×3): 0.63 mg via RESPIRATORY_TRACT
  Filled 2019-05-29 (×4): qty 3

## 2019-05-29 MED ORDER — METOPROLOL TARTRATE 5 MG/5ML IV SOLN
5.0000 mg | INTRAVENOUS | Status: DC | PRN
  Administered 2019-05-29: 5 mg via INTRAVENOUS
  Filled 2019-05-29: qty 5

## 2019-05-29 NOTE — Progress Notes (Signed)
[facility] PROGRESS NOTE    Progress Note  Adrian Andrews  [id number] DOB: September 19, 1950 DOA: 05/24/2019 PCP: [name], MD   Brief Narrative:   Adrian Andrews is an 69 y.o. male past medical history significant of chronic respiratory failure secondary COPD on 3L Home O2, insulin-dependent diabetes mellitus, status post left carotid endarterectomy, history of CVA essential hypertension presented to ED with 4 days of dyspnea. In the ED was found to be hypoxic, CXR noted CHF, Troponins 1400 Subjective: Feels slightly more short of breath today, coughing more overnight, oxygen bumped up to 4.5 L  Assessment/Plan:   Acute respiratory failure with hypoxia  Acute diastolic CHF Non-ST elevation MI, elevated troponin -Admitted with fluid overload, elevated high-sensitivity troponins -Cardiology consulted, diuresed with IV Lasix -Volume status is improving, weight down 6 kg, -I/O is inaccurate -Cardiology following, changed to oral Lasix -patient declined left heart catheterization-plan for medical management -Started on Plavix, continue aspirin metoprolol and statin -Slight worsening cough and dyspnea today, add Mucinex and duo nebs  Hyperkalemia -May be from renal insufficiency, -Treated with Lokelma -Improved  Acute kidney injury on chronic kidney disease stage II: His baseline creatinine is around 1 on admission 1.8, improving with diuresis. -Likely cardiorenal,  -Improved and stable  Mild hypervolemic hyponatremia: -Resolved  Type 2 diabetes mellitus with other specified complication (HCC) Hemoglobin A1c of 8.5, agree with stopping Actos. -CBGs poorly controlled, increase NovoLog with meals, continue Lantus  Essential hypertension: -Stable, continue metoprolol and Lasix  DVT prophylaxis: lovenox  CODE STATUS: Family Communication: Discussed with patient in detail, no family Status is: Inpatient Remains inpatient appropriate because: Slight worsening oxygen  requirement,  Dispo: The patient is from: Home              Anticipated d/c is to: Assisted living              Anticipated d/c date is: > 1-2 days              Patient currently is not medically stable to d/c.  Medical Consultants:    Cardiology  Objective:    Vitals:   05/29/19 0514 05/29/19 0839 05/29/19 0921 05/29/19 1121  BP: (!) 113/56 94/61 106/62 108/61  Pulse: 88 99 91 83  Resp: 19 20  20   Temp: 97.6 F (36.4 C)   (!) 97.5 F (36.4 C)  TempSrc: Oral   Oral  SpO2: 91% 93%  91%  Weight: 75.4 kg     Height:       SpO2: 91 % O2 Flow Rate (L/min): 4 L/min   Intake/Output Summary (Last 24 hours) at 05/29/2019 1135 Last data filed at 05/29/2019 [id number] Gross per 24 hour  Intake 483 ml  Output 250 ml  Net 233 ml   Filed Weights   05/27/19 0332 05/28/19 0520 05/29/19 0514  Weight: 78.5 kg 75.8 kg 75.4 kg    Exam General,: Pleasant elderly male sitting up in the recliner, AAO x3, uncomfortable appearing HEENT neck obese unable to assess JVD CVS S1-S2 regular rate rhythm Lungs with poor air movement few basilar rhonchi otherwise clear Extremities trace ankle edema Skin no rashes on exposed skin  Psychiatry:  mood and affect seem appropriate.   Data Reviewed:    Labs: Basic Metabolic Panel: Recent Labs  Lab 05/25/19 0516 05/25/19 0516 05/26/19 0839 05/26/19 0839 05/27/19 0651 05/27/19 0651 05/28/19 0624 05/29/19 0532  NA 133*  --  135  --  138  --  135 134*  K  5.0   < > 4.9   < > 5.5*   < > 3.9 4.2  CL 101  --  100  --  98  --  96* 94*  CO2 19*  --  24  --  29  --  28 29  GLUCOSE 283*  --  160*  --  155*  --  192* 267*  BUN 41*  --  42*  --  37*  --  33* 37*  CREATININE 1.55*  --  1.69*  --  1.59*  --  1.41* 1.48*  CALCIUM 8.8*  --  9.7  --  9.5  --  8.8* 8.8*   < > = values in this interval not displayed.   GFR Estimated Creatinine Clearance: 46.4 mL/min (A) (by C-G formula based on SCr of 1.48 mg/dL (H)). Liver Function Tests: Recent Labs   Lab 05/24/19 1129  AST 26  ALT 21  ALKPHOS 67  BILITOT 1.1  PROT 6.1*  ALBUMIN 3.4*   No results for input(s): LIPASE, AMYLASE in the last 168 hours. No results for input(s): AMMONIA in the last 168 hours. Coagulation profile No results for input(s): INR, PROTIME in the last 168 hours. COVID-19 Labs  No results for input(s): DDIMER, FERRITIN, LDH, CRP in the last 72 hours.  Lab Results  Component Value Date   SARSCOV2NAA NEGATIVE 05/24/2019   [place] NEGATIVE 09/28/2018   [place] NEGATIVE 09/13/2018   [place] NEGATIVE 09/10/2018    CBC: Recent Labs  Lab 05/24/19 1129 05/24/19 2246 05/25/19 0516 05/26/19 0839 05/27/19 0651  WBC 5.5  --  3.1* 7.4 5.8  NEUTROABS 4.3  --   --   --   --   HGB 11.8* 11.9* 10.6* 12.6* 12.5*  HCT 31.6* 35.0* 31.6* 34.3* 34.4*  MCV 107.8*  --  103.6* 105.9* 103.9*  PLT 122*  --  PLATELET CLUMPS NOTED ON SMEAR, UNABLE TO ESTIMATE 128* 145*   Cardiac Enzymes: Recent Labs  Lab 05/24/19 1944  CKTOTAL 193   BNP (last 3 results) No results for input(s): PROBNP in the last 8760 hours. CBG: Recent Labs  Lab 05/28/19 1111 05/28/19 1642 05/28/19 2047 05/29/19 0608 05/29/19 1057  GLUCAP 294* 314* 192* 235* 304*   D-Dimer: No results for input(s): DDIMER in the last 72 hours. Hgb A1c: No results for input(s): HGBA1C in the last 72 hours. Lipid Profile: No results for input(s): CHOL, HDL, LDLCALC, TRIG, CHOLHDL, LDLDIRECT in the last 72 hours. Thyroid function studies: No results for input(s): TSH, T4TOTAL, T3FREE, THYROIDAB in the last 72 hours.  Invalid input(s): FREET3 Anemia work up: No results for input(s): VITAMINB12, FOLATE, FERRITIN, TIBC, IRON, RETICCTPCT in the last 72 hours. Sepsis Labs: Recent Labs  Lab 05/24/19 1129 05/25/19 0516 05/26/19 0839 05/27/19 0651  WBC 5.5 3.1* 7.4 5.8   Microbiology Recent Results (from the past 240 hour(s))  SARS Coronavirus 2 by RT PCR (hospital order, performed in [facility] hospital lab) Nasopharyngeal Nasopharyngeal Swab     Status: None   Collection Time: 05/24/19 11:29 AM   Specimen: Nasopharyngeal Swab  Result Value Ref Range Status   SARS Coronavirus 2 NEGATIVE NEGATIVE Final    Comment: (NOTE) SARS-CoV-2 target nucleic acids are NOT DETECTED. The SARS-CoV-2 RNA is generally detectable in upper and lower respiratory specimens during the acute phase of infection. The lowest concentration of SARS-CoV-2 viral copies this assay can detect is 250 copies / mL. A negative result does not preclude SARS-CoV-2 infection and should not  be used as the sole basis for treatment or other patient management decisions.  A negative result may occur with improper specimen collection / handling, submission of specimen other than nasopharyngeal swab, presence of viral mutation(s) within the areas targeted by this assay, and inadequate number of viral copies (<250 copies / mL). A negative result must be combined with clinical observations, patient history, and epidemiological information. Fact Sheet for Patients:   StrictlyIdeas.no Fact Sheet for Healthcare Providers: BankingDealers.co.za This test is not yet approved or cleared  by the [place] FDA and has been authorized for detection and/or diagnosis of SARS-CoV-2 by FDA under an Emergency Use Authorization (EUA).  This EUA will remain in effect (meaning this test can be used) for the duration of the COVID-19 declaration under Section 564(b)(1) of the Act, 21 U.S.C. section 360bbb-3(b)(1), unless the authorization is terminated or revoked sooner. Performed at [facility], [place] [address] [place], [place] [address]      Medications:   . aspirin EC  81 mg Oral Daily  . atorvastatin  40 mg Oral QHS  . clopidogrel  75 mg Oral Daily  . clotrimazole  1 application Topical BID  . feeding supplement (GLUCERNA SHAKE)  237 mL Oral TID BM  . fluticasone  furoate-vilanterol  1 puff Inhalation Daily  . furosemide  80 mg Oral Daily  . gabapentin  300 mg Oral q morning - 10a  . guaiFENesin  600 mg Oral BID  . insulin aspart  0-9 Units Subcutaneous TID WC  . insulin aspart  3 Units Subcutaneous TID WC  . insulin detemir  5 Units Subcutaneous BID  . ipratropium-albuterol  3 mL Nebulization QID  . melatonin  6 mg Oral QHS  . metoprolol tartrate  25 mg Oral BID  . multivitamin with minerals  1 tablet Oral Daily  . pantoprazole  40 mg Oral BID  . sertraline  100 mg Oral Daily  . sodium chloride flush  3 mL Intravenous Q12H  . tamsulosin  0.4 mg Oral QHS  . traZODone  50 mg Oral QHS   Continuous Infusions: . sodium chloride        LOS: 5 days   Domenic Polite  [facility]  05/29/2019, 11:35 AM

## 2019-05-29 NOTE — Progress Notes (Signed)
Progress Note   Subjective   Doing resaonably well today, the patient denies CP.  He is a little more SOB today.  No new concerns  Inpatient Medications    Scheduled Meds: . aspirin EC  81 mg Oral Daily  . atorvastatin  40 mg Oral QHS  . clopidogrel  75 mg Oral Daily  . clotrimazole  1 application Topical BID  . feeding supplement (GLUCERNA SHAKE)  237 mL Oral TID BM  . fluticasone furoate-vilanterol  1 puff Inhalation Daily  . furosemide  80 mg Oral Daily  . gabapentin  300 mg Oral q morning - 10a  . guaiFENesin  600 mg Oral BID  . insulin aspart  0-9 Units Subcutaneous TID WC  . insulin aspart  3 Units Subcutaneous TID WC  . insulin detemir  5 Units Subcutaneous BID  . melatonin  6 mg Oral QHS  . metoprolol tartrate  25 mg Oral BID  . multivitamin with minerals  1 tablet Oral Daily  . pantoprazole  40 mg Oral BID  . sertraline  100 mg Oral Daily  . sodium chloride flush  3 mL Intravenous Q12H  . tamsulosin  0.4 mg Oral QHS  . traZODone  50 mg Oral QHS   Continuous Infusions: . sodium chloride     PRN Meds: sodium chloride, acetaminophen, albuterol, calcium carbonate, guaiFENesin-dextromethorphan, HYDROcodone-acetaminophen, ondansetron (ZOFRAN) IV, simethicone, sodium chloride, sodium chloride flush, traMADol   Vital Signs    Vitals:   05/29/19 0129 05/29/19 0514 05/29/19 0839 05/29/19 0921  BP: 101/60 (!) 113/56 94/61 106/62  Pulse: 94 88 99 91  Resp: 19 19 20    Temp: 98.2 F (36.8 C) 97.6 F (36.4 C)    TempSrc: Oral Oral    SpO2: 90% 91% 93%   Weight:  75.4 kg    Height:        Intake/Output Summary (Last 24 hours) at 05/29/2019 1036 Last data filed at 05/29/2019 0923 Gross per 24 hour  Intake 483 ml  Output 250 ml  Net 233 ml   Filed Weights   05/27/19 0332 05/28/19 0520 05/29/19 0514  Weight: 78.5 kg 75.8 kg 75.4 kg     Physical Exam   GEN- The patient is chronically ill appearing, alert and oriented x 3 today.   In a chair, wearing  O2 Head- normocephalic, atraumatic Eyes-  Sclera clear, conjunctiva pink Ears- hearing intact Oropharynx- clear Neck- supple, Lungs-  normal work of breathing Heart- Regular rate and rhythm  GI- soft  Extremities- no clubbing, cyanosis, or edema  MS- no significant deformity or atrophy Skin- no rash or lesion Psych- euthymic mood, full affect Neuro- strength and sensation are intact   Labs    Chemistry Recent Labs  Lab 05/24/19 1129 05/24/19 2246 05/27/19 0651 05/28/19 0624 05/29/19 0532  NA 134*   < > 138 135 134*  K 5.5*   < > 5.5* 3.9 4.2  CL 105   < > 98 96* 94*  CO2 19*   < > 29 28 29   GLUCOSE 224*   < > 155* 192* 267*  BUN 41*   < > 37* 33* 37*  CREATININE 1.85*   < > 1.59* 1.41* 1.48*  CALCIUM 9.0   < > 9.5 8.8* 8.8*  PROT 6.1*  --   --   --   --   ALBUMIN 3.4*  --   --   --   --   AST 26  --   --   --   --  ALT 21  --   --   --   --   ALKPHOS 67  --   --   --   --   BILITOT 1.1  --   --   --   --   GFRNONAA 36*   < > 43* 50* 47*  GFRAA 42*   < > 50* 58* 55*  ANIONGAP 10   < > 11 11 11    < > = values in this interval not displayed.     Hematology Recent Labs  Lab 05/25/19 0516 05/26/19 0839 05/27/19 0651  WBC 3.1* 7.4 5.8  RBC 3.05* 3.24* 3.31*  HGB 10.6* 12.6* 12.5*  HCT 31.6* 34.3* 34.4*  MCV 103.6* 105.9* 103.9*  MCH 34.8* 38.9* 37.8*  MCHC 33.5 36.7* 36.3*  RDW 14.9 16.9* 15.9*  PLT PLATELET CLUMPS NOTED ON SMEAR, UNABLE TO ESTIMATE 128* 145*     Patient Profile   69 y.o.malewith PMH of HTN, HLD, carotid artery disease s/p L CEA, COPD, CVA, DM II and former tobacco use who presented dyspnea and elevated troponin. He was previously admitted in Sep 2020 with RLL PNA and elevated trop. Myoview at the time was low risk however showed moderate sized inferior wall defect suspected to be infarct with minimal ischemia.    Assessment & Plan    1.  Acute diastolic dysfunction Clinically improving Now stable on oral diuretics Will ask  research to screen for alleviate HF trial as an outpatient Continue current medical therapy  2. Elevated troponin Clinically stable Likely due to demand ischemia Will treat medically as per patient preference  3. Chronic SOB secondary to oxygen dependant COPD On home O2 Clinically stable  4. Hypertensive cardiovascular disease with stage IIIa renal failure Stable No change required today Given soft BP and respiratory status, I would not advise increasing metoprolol  Continue current medical management Cardiology to be available as needed this weekend.  Thompson Grayer MD, [facility] 05/29/2019 10:36 AM

## 2019-05-29 NOTE — Progress Notes (Signed)
Pt c/a/ox4 during shift report. Pt denies complaints, but is his breathing is noticeably labored. Pt does not seem to think this is far from his baseline, but reports feeling tired/overexerted.

## 2019-05-29 NOTE — Progress Notes (Signed)
   05/29/19 2140  Assess: MEWS Score  ECG Heart Rate (!) 155  Assess: MEWS Score  MEWS Temp 0  MEWS Systolic 0  MEWS Pulse 3  MEWS RR 0  MEWS LOC 0  MEWS Score 3  MEWS Score Color Yellow  Assess: if the MEWS score is Yellow or Red  Were vital signs taken at a resting state? Yes  Focused Assessment Documented focused assessment  Early Detection of Sepsis Score *See Row Information* Low  MEWS guidelines implemented *See Row Information* Yes  Treat  MEWS Interventions Administered scheduled meds/treatments;Administered prn meds/treatments  Take Vital Signs  Increase Vital Sign Frequency  Yellow: Q 2hr X 2 then Q 4hr X 2, if remains yellow, continue Q 4hrs  Escalate  MEWS: Escalate Yellow: discuss with charge nurse/RN and consider discussing with provider and RRT  Notify: Charge Nurse/RN  Name of Charge Nurse/RN Notified Alisha   Date Charge Nurse/RN Notified 05/29/19  Time Charge Nurse/RN Notified 2215  Notify: Provider  Provider Name/Title [place]  Date Provider Notified 05/29/19  Time Provider Notified 2215  Document  Progress note created (see row info) Yes

## 2019-05-29 NOTE — Progress Notes (Signed)
Page to Cards PA.  3e21 [place] please call, possible home/current med discrepancy = soft BP

## 2019-05-29 NOTE — Progress Notes (Signed)
Patient appears visible less dyspneic at rest this afternoon.  He is still able to ambulate independently to the bathroom, he is aware of a drop in O2 saturation when he does, as he self-monitors regularly. Pt states his O2 climbs/returns to 90s within minutes of resting again.

## 2019-05-30 ENCOUNTER — Inpatient Hospital Stay (HOSPITAL_COMMUNITY): Payer: Medicare Other

## 2019-05-30 LAB — BASIC METABOLIC PANEL
Anion gap: 10 (ref 5–15)
BUN: 37 mg/dL — ABNORMAL HIGH (ref 8–23)
CO2: 27 mmol/L (ref 22–32)
Calcium: 9 mg/dL (ref 8.9–10.3)
Chloride: 97 mmol/L — ABNORMAL LOW (ref 98–111)
Creatinine, Ser: 1.5 mg/dL — ABNORMAL HIGH (ref 0.61–1.24)
GFR calc Af Amer: 54 mL/min — ABNORMAL LOW (ref >60)
GFR calc non Af Amer: 46 mL/min — ABNORMAL LOW (ref >60)
Glucose, Bld: 186 mg/dL — ABNORMAL HIGH (ref 70–99)
Potassium: 5.1 mmol/L (ref 3.5–5.1)
Sodium: 134 mmol/L — ABNORMAL LOW (ref 135–145)

## 2019-05-30 LAB — CBC
HCT: 32 % — ABNORMAL LOW (ref 39.0–52.0)
Hemoglobin: 11.2 g/dL — ABNORMAL LOW (ref 13.0–17.0)
MCH: 35.8 pg — ABNORMAL HIGH (ref 26.0–34.0)
MCHC: 35 g/dL (ref 30.0–36.0)
MCV: 102.2 fL — ABNORMAL HIGH (ref 80.0–100.0)
Platelets: 138 10*3/uL — ABNORMAL LOW (ref 150–400)
RBC: 3.13 MIL/uL — ABNORMAL LOW (ref 4.22–5.81)
RDW: 16 % — ABNORMAL HIGH (ref 11.5–15.5)
WBC: 6.1 10*3/uL (ref 4.0–10.5)
nRBC: 0 % (ref 0.0–0.2)

## 2019-05-30 LAB — GLUCOSE, CAPILLARY
Glucose-Capillary: 190 mg/dL — ABNORMAL HIGH (ref 70–99)
Glucose-Capillary: 276 mg/dL — ABNORMAL HIGH (ref 70–99)
Glucose-Capillary: 256 mg/dL — ABNORMAL HIGH (ref 70–99)
Glucose-Capillary: 265 mg/dL — ABNORMAL HIGH (ref 70–99)

## 2019-05-30 MED ORDER — METOPROLOL TARTRATE 12.5 MG HALF TABLET
12.5000 mg | ORAL_TABLET | Freq: Once | ORAL | Status: AC
  Administered 2019-05-30: 12.5 mg via ORAL
  Filled 2019-05-30: qty 1

## 2019-05-30 MED ORDER — ENOXAPARIN SODIUM 40 MG/0.4ML ~~LOC~~ SOLN
40.0000 mg | SUBCUTANEOUS | Status: DC
  Administered 2019-05-30 – 2019-05-31 (×2): 40 mg via SUBCUTANEOUS
  Filled 2019-05-30 (×2): qty 0.4

## 2019-05-30 MED ORDER — METOPROLOL TARTRATE 25 MG PO TABS
37.5000 mg | ORAL_TABLET | Freq: Two times a day (BID) | ORAL | Status: DC
  Administered 2019-05-30 – 2019-05-31 (×2): 37.5 mg via ORAL
  Filled 2019-05-30 (×2): qty 1

## 2019-05-30 NOTE — Plan of Care (Signed)
  Problem: Education: Goal: Knowledge of General Education information will improve Description: Including pain rating scale, medication(s)/side effects and non-pharmacologic comfort measures Outcome: Progressing   Problem: Health Behavior/Discharge Planning: Goal: Ability to manage health-related needs will improve Outcome: Progressing   Problem: Clinical Measurements: Goal: Ability to maintain clinical measurements within normal limits will improve Outcome: Progressing Goal: Will remain free from infection Outcome: Progressing Goal: Diagnostic test results will improve Outcome: Progressing Goal: Respiratory complications will improve Outcome: Progressing Goal: Cardiovascular complication will be avoided Outcome: Progressing   Problem: Activity: Goal: Risk for activity intolerance will decrease Outcome: Progressing   Problem: Coping: Goal: Level of anxiety will decrease Outcome: Progressing   Problem: Pain Managment: Goal: General experience of comfort will improve Outcome: Progressing   Problem: Safety: Goal: Ability to remain free from injury will improve Outcome: Progressing   Problem: Education: Goal: Ability to describe self-care measures that may prevent or decrease complications (Diabetes Survival Skills Education) will improve Outcome: Progressing Goal: Individualized Educational Video(s) Outcome: Progressing   Problem: Coping: Goal: Ability to adjust to condition or change in health will improve Outcome: Progressing   Problem: Fluid Volume: Goal: Ability to maintain a balanced intake and output will improve Outcome: Progressing   Problem: Health Behavior/Discharge Planning: Goal: Ability to identify and utilize available resources and services will improve Outcome: Progressing Goal: Ability to manage health-related needs will improve Outcome: Progressing   Problem: Metabolic: Goal: Ability to maintain appropriate glucose levels will  improve Outcome: Progressing   Problem: Nutritional: Goal: Maintenance of adequate nutrition will improve Outcome: Progressing   Problem: Skin Integrity: Goal: Risk for impaired skin integrity will decrease Outcome: Progressing   Problem: Tissue Perfusion: Goal: Adequacy of tissue perfusion will improve Outcome: Progressing   Problem: Education: Goal: Ability to demonstrate management of disease process will improve Outcome: Progressing Goal: Ability to verbalize understanding of medication therapies will improve Outcome: Progressing Goal: Individualized Educational Video(s) Outcome: Progressing   Problem: Activity: Goal: Capacity to carry out activities will improve Outcome: Progressing   Problem: Cardiac: Goal: Ability to achieve and maintain adequate cardiopulmonary perfusion will improve Outcome: Progressing

## 2019-05-30 NOTE — Plan of Care (Signed)
  Problem: Nutrition: Goal: Adequate nutrition will be maintained Outcome: Completed/Met   Problem: Elimination: Goal: Will not experience complications related to bowel motility Outcome: Completed/Met Goal: Will not experience complications related to urinary retention Outcome: Completed/Met   Problem: Skin Integrity: Goal: Risk for impaired skin integrity will decrease Outcome: Completed/Met   Problem: Nutritional: Goal: Progress toward achieving an optimal weight will improve Outcome: Completed/Met

## 2019-05-30 NOTE — Progress Notes (Signed)
Occupational Therapy Treatment Patient Details Name: Adrian Andrews MRN: [id number] DOB: 20-Aug-1950 Today's Date: 05/30/2019    History of present illness 69 y.o. male with PMH of HTN, HLD, carotid artery disease s/p L CEA, COPD, CVA, DM II and former tobacco use who presented dyspnea and elevated troponin. He was previously admitted in Sep 2020 with RLL PNA and elevated trop. Myoview at the time was low risk however showed moderate sized inferior wall defect suspected to be infarct with minimal ischemia   OT comments  Pt continuing to progress well toward stated goals. Session focused on ECS recall, education, and implementation. Pt able to recall to use a shower seat as needed when bathing to conserve energy. Pt was also able to recall self monitoring O2 to self cue rest breaks. Reviewed pursed lip breathing with activity. Encouraged pt to continue to mobilize within personal limits to progress activity tolerance. HHOT remains appropriate for higher level ECS/IADL skills in home environment. Anticipate d/c tomorrow.   Follow Up Recommendations  Home health OT    Equipment Recommendations  None recommended by OT    Recommendations for Other Services      Precautions / Restrictions Precautions Precautions: Fall Precaution Comments: Watch O2 sats Restrictions Weight Bearing Restrictions: No       Mobility Bed Mobility               General bed mobility comments: up in chair  Transfers Overall transfer level: Needs assistance Equipment used: None Transfers: Sit to/from Stand Sit to Stand: Supervision              Balance Overall balance assessment: Needs assistance Sitting-balance support: No upper extremity supported;Feet supported Sitting balance-Leahy Scale: Good     Standing balance support: No upper extremity supported;During functional activity Standing balance-Leahy Scale: Fair                             ADL either performed or assessed  with clinical judgement   ADL                                         General ADL Comments: Focused session on ECS education, recall, and applying it to BADL routines     Vision Patient Visual Report: No change from baseline     Perception     Praxis      Cognition Arousal/Alertness: Awake/alert Behavior During Therapy: WFL for tasks assessed/performed Overall Cognitive Status: Within Functional Limits for tasks assessed                                          Exercises     Shoulder Instructions       General Comments      Pertinent Vitals/ Pain       Pain Assessment: No/denies pain  Home Living                                          Prior Functioning/Environment              Frequency  Min 2X/week        Progress Toward Goals  OT Goals(current goals can now be found in the care plan section)  Progress towards OT goals: Progressing toward goals  Acute Rehab OT Goals Patient Stated Goal: to go home OT Goal Formulation: With patient Time For Goal Achievement: 06/08/19 Potential to Achieve Goals: Good  Plan Discharge plan remains appropriate    Co-evaluation                 AM-PAC OT "6 Clicks" Daily Activity     Outcome Measure   Help from another person eating meals?: None Help from another person taking care of personal grooming?: None Help from another person toileting, which includes using toliet, bedpan, or urinal?: A Little Help from another person bathing (including washing, rinsing, drying)?: A Little Help from another person to put on and taking off regular upper body clothing?: None Help from another person to put on and taking off regular lower body clothing?: A Little 6 Click Score: 21    End of Session Equipment Utilized During Treatment: Oxygen  OT Visit Diagnosis: Unsteadiness on feet (R26.81);Muscle weakness (generalized) (M62.81)   Activity Tolerance Patient  tolerated treatment well   Patient Left in chair;with call bell/phone within reach   Nurse Communication Mobility status        Time: 1206-1216 OT Time Calculation (min): 10 min  Charges: OT General Charges $OT Visit: 1 Visit OT Treatments $Self Care/Home Management : 8-22 mins  Zenovia Jarred, MSOT, OTR/L [facility] Office Number: [phone] Pager: [phone]  Zenovia Jarred 05/30/2019, 1:37 PM

## 2019-05-30 NOTE — Progress Notes (Signed)
[facility] PROGRESS NOTE    Progress Note  Adrian Andrews  [id number] DOB: 09/17/1950 DOA: 05/24/2019 PCP: [name], MD   Brief Narrative:   Adrian Andrews is an 69 y.o. male past medical history significant of chronic respiratory failure secondary COPD on 3L Home O2, insulin-dependent diabetes mellitus, status post left carotid endarterectomy, history of CVA essential hypertension presented to ED with 4 days of dyspnea. In the ED was found to be hypoxic, CXR noted CHF, Troponins 1400  Subjective: Still with some dyspnea but overall improving, heart rate went up to 130, 150s overnight  Assessment/Plan:   Acute respiratory failure with hypoxia  Acute diastolic CHF Non-ST elevation MI, elevated troponin Sinus tachycardia -Admitted with fluid overload, elevated high-sensitivity troponins -Cardiology consulted, diuresed with IV Lasix -Volume status is improving, weight down 6 kg, -I/O is inaccurate -Cardiology following, changed to oral Lasix -patient declined left heart catheterization-plan for medical management -Started on Plavix, continue aspirin metoprolol and statin -Overnight with sinus tachycardia, heart rate going up to the 130s, discontinued albuterol neb, increase beta-blocker dose slightly to 37.5 mg,  reluctant to increase this much further given severe COPD, also repeat chest x-ray  Hyperkalemia -May be from renal insufficiency, -Treated with Lokelma -Improved  Acute kidney injury on chronic kidney disease stage II: His baseline creatinine is around 1 on admission 1.8, improving with diuresis. -Likely cardiorenal,  -Improved and stable  Mild hypervolemic hyponatremia: -Resolved  Type 2 diabetes mellitus with other specified complication (HCC) Hemoglobin A1c of 8.5, agree with stopping Actos. -CBGs poorly controlled, increase NovoLog with meals, continue Lantus  Essential hypertension: -Stable, continue metoprolol and Lasix  DVT prophylaxis:  lovenox  CODE STATUS: Family Communication: Discussed with patient in detail, no family Status is: Inpatient Remains inpatient appropriate because: Severe sinus tachycardia  Dispo: The patient is from: Home              Anticipated d/c is to: Assisted living              Anticipated d/c date is: > 1-2 days              Patient currently is not medically stable to d/c.  Medical Consultants:    Cardiology  Objective:    Vitals:   05/30/19 0300 05/30/19 0453 05/30/19 0838 05/30/19 0927  BP:  (!) 106/50  (!) 121/93  Pulse:  80  90  Resp:  19  16  Temp:  97.9 F (36.6 C)  98.1 F (36.7 C)  TempSrc:  Oral  Oral  SpO2:  94% 95% 94%  Weight: 75 kg     Height:       SpO2: 94 % O2 Flow Rate (L/min): 3 L/min FiO2 (%): 36 %   Intake/Output Summary (Last 24 hours) at 05/30/2019 1123 Last data filed at 05/30/2019 0930 Gross per 24 hour  Intake 460 ml  Output 2 ml  Net 458 ml   Filed Weights   05/28/19 0520 05/29/19 0514 05/30/19 0300  Weight: 75.8 kg 75.4 kg 75 kg    Exam Pleasant elderly male sitting up in the recliner, AAOx3, uncomfortable appearing HEENT no JVD CVS S1-S2 regular rate rhythm Lungs with poor air movement, rare basilar rhonchi, otherwise clear, no wheezing Abdomen is soft nontender with positive bowel sounds no organomegaly Extremities with no edema now Skin no rashes on exposed skin  Psychiatry:  mood and affect seem appropriate.   Data Reviewed:    Labs: Basic Metabolic Panel: Recent Labs  Lab 05/26/19 0839 05/26/19 0839 05/27/19 0651 05/27/19 0651 05/28/19 0624 05/28/19 0624 05/29/19 0532 05/30/19 0335  NA 135  --  138  --  135  --  134* 134*  K 4.9   < > 5.5*   < > 3.9   < > 4.2 5.1  CL 100  --  98  --  96*  --  94* 97*  CO2 24  --  29  --  28  --  29 27  GLUCOSE 160*  --  155*  --  192*  --  267* 186*  BUN 42*  --  37*  --  33*  --  37* 37*  CREATININE 1.69*  --  1.59*  --  1.41*  --  1.48* 1.50*  CALCIUM 9.7  --  9.5  --  8.8*   --  8.8* 9.0   < > = values in this interval not displayed.   GFR Estimated Creatinine Clearance: 45.8 mL/min (A) (by C-G formula based on SCr of 1.5 mg/dL (H)). Liver Function Tests: Recent Labs  Lab 05/24/19 1129  AST 26  ALT 21  ALKPHOS 67  BILITOT 1.1  PROT 6.1*  ALBUMIN 3.4*   No results for input(s): LIPASE, AMYLASE in the last 168 hours. No results for input(s): AMMONIA in the last 168 hours. Coagulation profile No results for input(s): INR, PROTIME in the last 168 hours. COVID-19 Labs  No results for input(s): DDIMER, FERRITIN, LDH, CRP in the last 72 hours.  Lab Results  Component Value Date   SARSCOV2NAA NEGATIVE 05/24/2019   [place] NEGATIVE 09/28/2018   [place] NEGATIVE 09/13/2018   [place] NEGATIVE 09/10/2018    CBC: Recent Labs  Lab 05/24/19 1129 05/24/19 1129 05/24/19 2246 05/25/19 0516 05/26/19 0839 05/27/19 0651 05/30/19 0335  WBC 5.5  --   --  3.1* 7.4 5.8 6.1  NEUTROABS 4.3  --   --   --   --   --   --   HGB 11.8*   < > 11.9* 10.6* 12.6* 12.5* 11.2*  HCT 31.6*   < > 35.0* 31.6* 34.3* 34.4* 32.0*  MCV 107.8*  --   --  103.6* 105.9* 103.9* 102.2*  PLT 122*  --   --  PLATELET CLUMPS NOTED ON SMEAR, UNABLE TO ESTIMATE 128* 145* 138*   < > = values in this interval not displayed.   Cardiac Enzymes: Recent Labs  Lab 05/24/19 1944  CKTOTAL 193   BNP (last 3 results) No results for input(s): PROBNP in the last 8760 hours. CBG: Recent Labs  Lab 05/29/19 0608 05/29/19 1057 05/29/19 1601 05/29/19 2108 05/30/19 0614  GLUCAP 235* 304* 397* 318* 190*   D-Dimer: No results for input(s): DDIMER in the last 72 hours. Hgb A1c: No results for input(s): HGBA1C in the last 72 hours. Lipid Profile: No results for input(s): CHOL, HDL, LDLCALC, TRIG, CHOLHDL, LDLDIRECT in the last 72 hours. Thyroid function studies: No results for input(s): TSH, T4TOTAL, T3FREE, THYROIDAB in the last 72 hours.  Invalid input(s): FREET3 Anemia work  up: No results for input(s): VITAMINB12, FOLATE, FERRITIN, TIBC, IRON, RETICCTPCT in the last 72 hours. Sepsis Labs: Recent Labs  Lab 05/25/19 0516 05/26/19 0839 05/27/19 0651 05/30/19 0335  WBC 3.1* 7.4 5.8 6.1   Microbiology Recent Results (from the past 240 hour(s))  SARS Coronavirus 2 by RT PCR (hospital order, performed in [facility] hospital lab) Nasopharyngeal Nasopharyngeal Swab     Status: None   Collection Time: 05/24/19  11:29 AM   Specimen: Nasopharyngeal Swab  Result Value Ref Range Status   SARS Coronavirus 2 NEGATIVE NEGATIVE Final    Comment: (NOTE) SARS-CoV-2 target nucleic acids are NOT DETECTED. The SARS-CoV-2 RNA is generally detectable in upper and lower respiratory specimens during the acute phase of infection. The lowest concentration of SARS-CoV-2 viral copies this assay can detect is 250 copies / mL. A negative result does not preclude SARS-CoV-2 infection and should not be used as the sole basis for treatment or other patient management decisions.  A negative result may occur with improper specimen collection / handling, submission of specimen other than nasopharyngeal swab, presence of viral mutation(s) within the areas targeted by this assay, and inadequate number of viral copies (<250 copies / mL). A negative result must be combined with clinical observations, patient history, and epidemiological information. Fact Sheet for Patients:   StrictlyIdeas.no Fact Sheet for Healthcare Providers: BankingDealers.co.za This test is not yet approved or cleared  by the [place] FDA and has been authorized for detection and/or diagnosis of SARS-CoV-2 by FDA under an Emergency Use Authorization (EUA).  This EUA will remain in effect (meaning this test can be used) for the duration of the COVID-19 declaration under Section 564(b)(1) of the Act, 21 U.S.C. section 360bbb-3(b)(1), unless the authorization is  terminated or revoked sooner. Performed at [facility], [address], [place], [place] [address]      Medications:   . aspirin EC  81 mg Oral Daily  . atorvastatin  40 mg Oral QHS  . clopidogrel  75 mg Oral Daily  . clotrimazole  1 application Topical BID  . feeding supplement (GLUCERNA SHAKE)  237 mL Oral TID BM  . fluticasone furoate-vilanterol  1 puff Inhalation Daily  . furosemide  80 mg Oral Daily  . gabapentin  300 mg Oral q morning - 10a  . guaiFENesin  600 mg Oral BID  . insulin aspart  0-9 Units Subcutaneous TID WC  . insulin aspart  5 Units Subcutaneous TID WC  . insulin detemir  5 Units Subcutaneous BID  . levalbuterol  0.63 mg Nebulization TID  . melatonin  6 mg Oral QHS  . metoprolol tartrate  12.5 mg Oral Once  . metoprolol tartrate  37.5 mg Oral BID  . multivitamin with minerals  1 tablet Oral Daily  . pantoprazole  40 mg Oral BID  . sertraline  100 mg Oral Daily  . sodium chloride flush  3 mL Intravenous Q12H  . tamsulosin  0.4 mg Oral QHS  . traZODone  50 mg Oral QHS   Continuous Infusions: . sodium chloride        LOS: 6 days   Domenic Polite  [facility]  05/30/2019, 11:23 AM

## 2019-05-31 DIAGNOSIS — J42 Unspecified chronic bronchitis: Secondary | ICD-10-CM

## 2019-05-31 LAB — BASIC METABOLIC PANEL
Anion gap: 7 (ref 5–15)
BUN: 40 mg/dL — ABNORMAL HIGH (ref 8–23)
CO2: 29 mmol/L (ref 22–32)
Calcium: 8.9 mg/dL (ref 8.9–10.3)
Chloride: 96 mmol/L — ABNORMAL LOW (ref 98–111)
Creatinine, Ser: 1.44 mg/dL — ABNORMAL HIGH (ref 0.61–1.24)
GFR calc Af Amer: 57 mL/min — ABNORMAL LOW (ref >60)
GFR calc non Af Amer: 49 mL/min — ABNORMAL LOW (ref >60)
Glucose, Bld: 216 mg/dL — ABNORMAL HIGH (ref 70–99)
Potassium: 4.7 mmol/L (ref 3.5–5.1)
Sodium: 132 mmol/L — ABNORMAL LOW (ref 135–145)

## 2019-05-31 LAB — SARS CORONAVIRUS 2 (TAT 6-24 HRS): SARS Coronavirus 2: NEGATIVE

## 2019-05-31 LAB — GLUCOSE, CAPILLARY
Glucose-Capillary: 210 mg/dL — ABNORMAL HIGH (ref 70–99)
Glucose-Capillary: 370 mg/dL — ABNORMAL HIGH (ref 70–99)
Glucose-Capillary: 192 mg/dL — ABNORMAL HIGH (ref 70–99)

## 2019-05-31 LAB — CBC
HCT: 32.9 % — ABNORMAL LOW (ref 39.0–52.0)
Hemoglobin: 11 g/dL — ABNORMAL LOW (ref 13.0–17.0)
MCH: 33.6 pg (ref 26.0–34.0)
MCHC: 33.4 g/dL (ref 30.0–36.0)
MCV: 100.6 fL — ABNORMAL HIGH (ref 80.0–100.0)
Platelets: 164 10*3/uL (ref 150–400)
RBC: 3.27 MIL/uL — ABNORMAL LOW (ref 4.22–5.81)
RDW: 14.9 % (ref 11.5–15.5)
WBC: 5.9 10*3/uL (ref 4.0–10.5)
nRBC: 0 % (ref 0.0–0.2)

## 2019-05-31 MED ORDER — CLOPIDOGREL BISULFATE 75 MG PO TABS: 75.0000 mg | ORAL_TABLET | Freq: Every day | ORAL | 0 refills | Status: AC

## 2019-05-31 MED ORDER — INSULIN DETEMIR 100 UNIT/ML ~~LOC~~ SOLN: 12.0000 [IU] | Freq: Every day | SUBCUTANEOUS | 0 refills | Status: DC

## 2019-05-31 MED ORDER — CANAGLIFLOZIN 100 MG PO TABS
100.0000 mg | ORAL_TABLET | Freq: Every day | ORAL | 0 refills | Status: DC
Start: 2019-05-31 — End: 2019-05-31

## 2019-05-31 MED ORDER — FUROSEMIDE 20 MG PO TABS: 40.0000 mg | ORAL_TABLET | Freq: Two times a day (BID) | ORAL | 0 refills | Status: DC

## 2019-05-31 MED ORDER — LEVALBUTEROL HCL 0.63 MG/3ML IN NEBU
0.6300 mg | INHALATION_SOLUTION | Freq: Two times a day (BID) | RESPIRATORY_TRACT | Status: DC
  Administered 2019-05-31: 0.63 mg via RESPIRATORY_TRACT
  Filled 2019-05-31 (×2): qty 3

## 2019-05-31 MED ORDER — METOPROLOL TARTRATE 25 MG PO TABS: 37.5000 mg | ORAL_TABLET | Freq: Two times a day (BID) | ORAL | 0 refills | Status: AC

## 2019-05-31 MED ORDER — FUROSEMIDE 20 MG PO TABS: 40.0000 mg | ORAL_TABLET | Freq: Every day | ORAL | 0 refills | Status: AC

## 2019-05-31 MED ORDER — DAPAGLIFLOZIN PROPANEDIOL 5 MG PO TABS
5.0000 mg | ORAL_TABLET | Freq: Every day | ORAL | 0 refills | Status: AC
Start: 2019-05-31 — End: ?

## 2019-05-31 NOTE — Discharge Summary (Addendum)
Physician Discharge Summary  Adrian Andrews [id number] DOB: 1950/04/07 DOA: 05/24/2019  PCP: [name], MD  Admit date: 05/24/2019 Discharge date: 05/31/2019  Time spent: 35 minutes  Recommendations for Outpatient Follow-up:  Cardiology Dr. Percival Spanish in 2 weeks Please check BMP in 1 week  Discharge Diagnoses:  Active Problems:   Acute respiratory failure with hypoxia (HCC) Chronic respiratory failure COPD on 3 L home O2 Acute diastolic CHF Non-ST elevation MI Elevated troponin   AKI (acute kidney injury) (HCC)   Type 2 diabetes mellitus with other specified complication (HCC) uncontrolled   CKD (chronic kidney disease) stage 3, GFR 30-59 ml/min   Essential hypertension   Acute on chronic diastolic CHF (congestive heart failure) (HCC)   CHF (congestive heart failure) ([place]) DO NOT RESUSCITATE   Discharge Condition: Stable  Diet recommendation: Low-sodium, heart healthy, diabetic  Filed Weights   05/29/19 0514 05/30/19 0300 05/31/19 0600  Weight: 75.4 kg 75 kg 73.1 kg    History of present illness:  Adrian Andrews is an 69 y.o. male past medical history significant of chronic respiratory failure secondary COPD on 3L Home O2, insulin-dependent diabetes mellitus, status post left carotid endarterectomy, history of CVA essential hypertension presented to ED with 4 days of dyspnea. In the ED was found to be hypoxic, CXR noted CHF, Troponins 1400  Hospital Course:   Acute respiratory failure with hypoxia  Acute diastolic CHF Non-ST elevation MI, elevated troponin Sinus tachycardia -Admitted with fluid overload, elevated high-sensitivity troponins -Cardiology consulted, diuresed with IV Lasix -Volume status is improving, weight down 8 kg, -transitioned to oral lasix  -patient declined left heart catheterization-plan for medical management -Started on Plavix, continue aspirin metoprolol and statin -metoprolol dose increased to 37.5mg  bid -discharge back to assisted  living facility with home health services -Follow-up with cardiology Dr. Percival Spanish in 2 weeks  COPD/chronic respiratory failure -Stable, on 3 L home O2 at baseline -Continue nebs  PRN  Hyperkalemia -May be from renal insufficiency, -Treated with Lokelma -Improved  Acute kidney injury on chronic kidney disease stage II: His baseline creatinine is around 1 on admission 1.8, improving with diuresis. -Likely cardiorenal,  -Improved and stable at 1.4-1.5 range  Mild hypervolemic hyponatremia: -Resolved  Type 2 diabetes mellitus with other specified complication (HCC) Hemoglobin A1c of 8.5, stopped actos due to CHF. -CBGs poorly controlled, used insulin inpatient, added SGLT2 inhibitors to help with CHF and DM at DC  Essential hypertension: -Stable, continue metoprolol and Lasix    Consultations:  Cardiology  Discharge Exam: Vitals:   05/30/19 2010 05/31/19 0555  BP:  106/67  Pulse:  81  Resp:  18  Temp:  98 F (36.7 C)  SpO2: (!) 89% 94%    General: AAOx3, no distress Cardiovascular: S1-S2, regular rate rhythm Respiratory: Few basilar rhonchi otherwise clear  Discharge Instructions   Discharge Instructions    Diet - low sodium heart healthy   Complete by: As directed    Diet Carb Modified   Complete by: As directed    Increase activity slowly   Complete by: As directed      Allergies as of 05/31/2019      Reactions   Clindamycin/lincomycin    Throat tightens up   Contrast Media [iodinated Diagnostic Agents] Shortness Of Breath   11/20/2004 reaction with CTA CAP   Metformin And Related Other (See Comments)   Effected kidney function      Medication List    STOP taking these medications   atenolol 50 MG  tablet Commonly known as: TENORMIN   pioglitazone 15 MG tablet Commonly known as: ACTOS   pioglitazone 30 MG tablet Commonly known as: ACTOS     TAKE these medications   acetaminophen 325 MG tablet Commonly known as: TYLENOL Take 2  tablets (650 mg total) by mouth every 6 (six) hours as needed for mild pain or headache.   albuterol 108 (90 Base) MCG/ACT inhaler Commonly known as: VENTOLIN HFA Inhale 2 puffs into the lungs every 6 (six) hours as needed for wheezing or shortness of breath.   aspirin EC 81 MG tablet Take 81 mg by mouth daily.   atorvastatin 40 MG tablet Commonly known as: LIPITOR Take 20 mg by mouth at bedtime.   Bevespi Aerosphere 9-4.8 MCG/ACT Aero Generic drug: Glycopyrrolate-Formoterol Inhale 2 puffs into the lungs 2 (two) times daily.   budesonide-formoterol 80-4.5 MCG/ACT inhaler Commonly known as: SYMBICORT Inhale 1 puff into the lungs 2 (two) times daily.   calcium carbonate 750 MG chewable tablet Commonly known as: TUMS EX Chew 1 tablet by mouth daily as needed (For indegestion or nausea).   clopidogrel 75 MG tablet Commonly known as: PLAVIX Take 1 tablet (75 mg total) by mouth daily. Start taking on: Jun 01, 2019   dapagliflozin propanediol 5 MG Tabs tablet Commonly known as: FARXIGA Take 5 mg by mouth daily before breakfast.   fluticasone 50 MCG/ACT nasal spray Commonly known as: FLONASE Place 2 sprays into both nostrils daily.   furosemide 20 MG tablet Commonly known as: LASIX Take 2 tablets (40 mg total) by mouth daily. What changed:   how much to take  when to take this  reasons to take this   gabapentin 300 MG capsule Commonly known as: NEURONTIN Take 1 capsule (300 mg total) by mouth every morning. What changed: Another medication with the same name was removed. Continue taking this medication, and follow the directions you see here.   glipiZIDE 10 MG tablet Commonly known as: GLUCOTROL Take 1 tablet (10 mg total) by mouth 2 (two) times daily before a meal.   guaiFENesin 600 MG 12 hr tablet Commonly known as: MUCINEX Take 1 tablet (600 mg total) by mouth 2 (two) times daily.   HYDROcodone-acetaminophen 5-325 MG tablet Commonly known as:  NORCO/VICODIN Take 1 tablet by mouth 2 (two) times daily as needed for moderate pain.   metoprolol tartrate 25 MG tablet Commonly known as: LOPRESSOR Take 1.5 tablets (37.5 mg total) by mouth 2 (two) times daily. What changed: how much to take   pantoprazole 40 MG tablet Commonly known as: PROTONIX Take 1 tablet (40 mg total) by mouth 2 (two) times daily.   polyethylene glycol 17 g packet Commonly known as: MIRALAX / GLYCOLAX Take 17 g by mouth daily as needed for moderate constipation.   sertraline 100 MG tablet Commonly known as: ZOLOFT Take 100 mg by mouth daily.   tamsulosin 0.4 MG Caps capsule Commonly known as: FLOMAX Take 0.4 mg by mouth at bedtime.   traMADol 50 MG tablet Commonly known as: ULTRAM Take 1 tablet (50 mg total) by mouth daily as needed for severe pain (arthritis). What changed:   when to take this  reasons to take this   traZODone 50 MG tablet Commonly known as: DESYREL Take 50 mg by mouth at bedtime.      Allergies  Allergen Reactions  . Clindamycin/Lincomycin     Throat tightens up  . Contrast Media [Iodinated Diagnostic Agents] Shortness Of Breath    11/20/2004  reaction with CTA CAP  . Metformin And Related Other (See Comments)    Effected kidney function   Follow-up Information    Minus Breeding, MD. Go on 06/11/2019.   Specialty: Cardiology Why: @11 :20am for post hospital follow up  Contact information: [address] [place] [address] [phone]        [name], MD.   Specialty: Family Medicine Why: The office will call patient Contact information: [address] [place] [address] [phone]            The results of significant diagnostics from this hospitalization (including imaging, microbiology, ancillary and laboratory) are listed below for reference.    Significant Diagnostic Studies: DG Chest 2 View  Result Date: 05/30/2019 CLINICAL DATA:  Shortness of breath  EXAM: CHEST - 2 VIEW COMPARISON:  05/24/2019, 09/20/2018 FINDINGS: Cardiac shadow is stable. Aortic calcifications are again seen. Mild chronic interstitial changes are seen similar to that noted on the prior exams. Mild right basilar atelectasis is noted new from the prior study. Lungs are mildly hyperinflated. No acute bony abnormality is noted. IMPRESSION: Chronic interstitial changes with slight increase in right basilar atelectasis. Electronically Signed   By: Inez Catalina M.D.   On: 05/30/2019 14:30   US RENAL  Result Date: 05/24/2019 CLINICAL DATA:  Acute kidney injury EXAM: RENAL / URINARY TRACT ULTRASOUND COMPLETE COMPARISON:  None. FINDINGS: Right Kidney: Renal measurements: 9.4 x 4.6 x 4.7 cm = volume: 106.4 mL. Cortical echogenicity is normal. No hydronephrosis. Small exophytic cyst at the midpole measuring 1.2 cm. Left Kidney: Renal measurements: 8.7 x 5 x 5.2 cm = volume: 119.8 mL. Echogenicity within normal limits. No mass or hydronephrosis visualized. Bladder: Appears slightly thick walled Other: None. IMPRESSION: 1. Negative for hydronephrosis.  Small cyst in the right kidney 2. Slightly thick-walled appearance of the bladder which is nonspecific, consider cystitis or potential chronic obstruction. Electronically Signed   By: Donavan Foil M.D.   On: 05/24/2019 20:34   DG Chest Portable 1 View  Result Date: 05/24/2019 CLINICAL DATA:  Shortness of breath, COPD EXAM: PORTABLE CHEST 1 VIEW COMPARISON:  09/20/2018 FINDINGS: Heart size is borderline enlarged, stable. Atherosclerotic calcification of the aortic knob. Mild streaky bibasilar opacities, right greater than left, favor atelectasis. No pleural effusion. No pneumothorax. IMPRESSION: Mild streaky bibasilar opacities, right greater than left, favor atelectasis. Electronically Signed   By: Davina Poke D.O.   On: 05/24/2019 12:22   ECHOCARDIOGRAM COMPLETE  Result Date: 05/25/2019    ECHOCARDIOGRAM REPORT   Patient Name:   Adrian Andrews Date of Exam: 05/25/2019 Medical Rec #:  [id number]      Height:       69.0 in Accession #:    [id number]     Weight:       157.6 lb Date of Birth:  1950/03/29     BSA:          1.867 m Patient Age:    22 years       BP:           131/69 mmHg Patient Gender: M              HR:           64 bpm. Exam Location:  Inpatient Procedure: 2D Echo Indications:    CHF-Acute Systolic [id number] / [id number]  History:        Patient has prior history of Echocardiogram examinations, most  recent 09/14/2018. CHF, COPD and Stroke; Risk                 Factors:Hypertension, Diabetes, Dyslipidemia and Former Smoker.                 Elevated troponin.  Sonographer:    Darlina Sicilian RDCS Referring Phys: [place]  1. Left ventricular ejection fraction, by estimation, is 55 to 60%. The left ventricle has normal function. The left ventricle has no regional wall motion abnormalities. Left ventricular diastolic parameters are consistent with Grade II diastolic dysfunction (pseudonormalization). Elevated left atrial pressure.  2. Right ventricular systolic function is mildly reduced. The right ventricular size is mildly enlarged. There is mildly elevated pulmonary artery systolic pressure. The estimated right ventricular systolic pressure is 99991111 mmHg.  3. The mitral valve is normal in structure. No evidence of mitral valve regurgitation. No evidence of mitral stenosis.  4. The aortic valve is tricuspid. Aortic valve regurgitation is not visualized. Mild aortic valve sclerosis is present, with no evidence of aortic valve stenosis.  5. The inferior vena cava is dilated in size with <50% respiratory variability, suggesting right atrial pressure of 15 mmHg. Comparison(s): No significant change from prior study. Prior images reviewed side by side. FINDINGS  Left Ventricle: Left ventricular ejection fraction, by estimation, is 55 to 60%. The left ventricle has normal function. The left ventricle has no  regional wall motion abnormalities. The left ventricular internal cavity size was normal in size. There is  no left ventricular hypertrophy. Left ventricular diastolic parameters are consistent with Grade II diastolic dysfunction (pseudonormalization). Elevated left atrial pressure. Right Ventricle: The right ventricular size is mildly enlarged. No increase in right ventricular wall thickness. Right ventricular systolic function is mildly reduced. There is mildly elevated pulmonary artery systolic pressure. The tricuspid regurgitant  velocity is 2.40 m/s, and with an assumed right atrial pressure of 15 mmHg, the estimated right ventricular systolic pressure is 99991111 mmHg. Left Atrium: Left atrial size was normal in size. Right Atrium: Right atrial size was normal in size. Pericardium: There is no evidence of pericardial effusion. Mitral Valve: The mitral valve is normal in structure. Normal mobility of the mitral valve leaflets. Mild mitral annular calcification. No evidence of mitral valve regurgitation. No evidence of mitral valve stenosis. Tricuspid Valve: The tricuspid valve is normal in structure. Tricuspid valve regurgitation is mild . No evidence of tricuspid stenosis. Aortic Valve: The aortic valve is tricuspid. Aortic valve regurgitation is not visualized. Mild aortic valve sclerosis is present, with no evidence of aortic valve stenosis. Pulmonic Valve: The pulmonic valve was grossly normal. Pulmonic valve regurgitation is trivial. No evidence of pulmonic stenosis. Aorta: The aortic root is normal in size and structure. Venous: The inferior vena cava is dilated in size with less than 50% respiratory variability, suggesting right atrial pressure of 15 mmHg. IAS/Shunts: No atrial level shunt detected by color flow Doppler.  LEFT VENTRICLE PLAX 2D LVIDd:         4.70 cm      Diastology LVIDs:         4.00 cm      LV e' lateral:   7.38 cm/s LV PW:         1.00 cm      LV E/e' lateral: 15.6 LV IVS:        1.00 cm       LV e' medial:    3.97 cm/s LVOT diam:  2.10 cm      LV E/e' medial:  29.0 LV SV:         70 LV SV Index:   38 LVOT Area:     3.46 cm  LV Volumes (MOD) LV vol d, MOD A2C: 105.0 ml LV vol d, MOD A4C: 94.3 ml LV vol s, MOD A2C: 70.5 ml LV vol s, MOD A4C: 36.0 ml LV SV MOD A2C:     34.5 ml LV SV MOD A4C:     94.3 ml LV SV MOD BP:      56.3 ml RIGHT VENTRICLE RV S prime:     7.95 cm/s TAPSE (M-mode): 1.3 cm LEFT ATRIUM             Index       RIGHT ATRIUM           Index LA diam:        3.00 cm 1.61 cm/m  RA Area:     20.00 cm LA Vol (A2C):   86.4 ml 46.27 ml/m RA Volume:   56.60 ml  30.31 ml/m LA Vol (A4C):   49.2 ml 26.35 ml/m LA Biplane Vol: 64.7 ml 34.65 ml/m  AORTIC VALVE LVOT Vmax:   87.50 cm/s LVOT Vmean:  58.800 cm/s LVOT VTI:    0.203 m  AORTA Ao Root diam: 3.50 cm MITRAL VALVE                TRICUSPID VALVE MV Area (PHT): 3.99 cm     TR Peak grad:   23.0 mmHg MV Decel Time: 190 msec     TR Vmax:        240.00 cm/s MV E velocity: 115.00 cm/s MV A velocity: 95.20 cm/s   SHUNTS MV E/A ratio:  1.21         Systemic VTI:  0.20 m                             Systemic Diam: 2.10 cm Dani Gobble Croitoru MD Electronically signed by Sanda Klein MD Signature Date/Time: 05/25/2019/3:26:01 PM    Final     Microbiology: Recent Results (from the past 240 hour(s))  SARS Coronavirus 2 by RT PCR (hospital order, performed in [place] hospital lab) Nasopharyngeal Nasopharyngeal Swab     Status: None   Collection Time: 05/24/19 11:29 AM   Specimen: Nasopharyngeal Swab  Result Value Ref Range Status   SARS Coronavirus 2 NEGATIVE NEGATIVE Final    Comment: (NOTE) SARS-CoV-2 target nucleic acids are NOT DETECTED. The SARS-CoV-2 RNA is generally detectable in upper and lower respiratory specimens during the acute phase of infection. The lowest concentration of SARS-CoV-2 viral copies this assay can detect is 250 copies / mL. A negative result does not preclude SARS-CoV-2 infection and should not be used  as the sole basis for treatment or other patient management decisions.  A negative result may occur with improper specimen collection / handling, submission of specimen other than nasopharyngeal swab, presence of viral mutation(s) within the areas targeted by this assay, and inadequate number of viral copies (<250 copies / mL). A negative result must be combined with clinical observations, patient history, and epidemiological information. Fact Sheet for Patients:   StrictlyIdeas.no Fact Sheet for Healthcare Providers: BankingDealers.co.za This test is not yet approved or cleared  by the [place] FDA and has been authorized for detection and/or diagnosis of SARS-CoV-2 by FDA under an Emergency Use Authorization (EUA).  This EUA will remain in effect (meaning this test can be used) for the duration of the COVID-19 declaration under Section 564(b)(1) of the Act, 21 U.S.C. section 360bbb-3(b)(1), unless the authorization is terminated or revoked sooner. Performed at [facility], [place] [address] [place], [place] [address]   SARS CORONAVIRUS 2 (TAT 6-24 HRS) Nasopharyngeal Nasopharyngeal Swab     Status: None   Collection Time: 05/31/19  6:23 AM   Specimen: Nasopharyngeal Swab  Result Value Ref Range Status   SARS Coronavirus 2 NEGATIVE NEGATIVE Final    Comment: (NOTE) SARS-CoV-2 target nucleic acids are NOT DETECTED. The SARS-CoV-2 RNA is generally detectable in upper and lower respiratory specimens during the acute phase of infection. Negative results do not preclude SARS-CoV-2 infection, do not rule out co-infections with other pathogens, and should not be used as the sole basis for treatment or other patient management decisions. Negative results must be combined with clinical observations, patient history, and epidemiological information. The expected result is Negative. Fact Sheet for  Patients: SugarRoll.be Fact Sheet for Healthcare Providers: https://www.woods-mathews.com/ This test is not yet approved or cleared by the [place] FDA and  has been authorized for detection and/or diagnosis of SARS-CoV-2 by FDA under an Emergency Use Authorization (EUA). This EUA will remain  in effect (meaning this test can be used) for the duration of the COVID-19 declaration under Section 56 4(b)(1) of the Act, 21 U.S.C. section 360bbb-3(b)(1), unless the authorization is terminated or revoked sooner. Performed at [facility], [place] [address], [place], [place] [address]      Labs: Basic Metabolic Panel: Recent Labs  Lab 05/27/19 [phone] 05/28/19 0624 05/29/19 0532 05/30/19 0335 05/31/19 0407  NA 138 135 134* 134* 132*  K 5.5* 3.9 4.2 5.1 4.7  CL 98 96* 94* 97* 96*  CO2 29 28 29 27 29   GLUCOSE 155* 192* 267* 186* 216*  BUN 37* 33* 37* 37* 40*  CREATININE 1.59* 1.41* 1.48* 1.50* 1.44*  CALCIUM 9.5 8.8* 8.8* 9.0 8.9   Liver Function Tests: No results for input(s): AST, ALT, ALKPHOS, BILITOT, PROT, ALBUMIN in the last 168 hours. No results for input(s): LIPASE, AMYLASE in the last 168 hours. No results for input(s): AMMONIA in the last 168 hours. CBC: Recent Labs  Lab 05/25/19 0516 05/26/19 0839 05/27/19 0651 05/30/19 0335 05/31/19 0407  WBC 3.1* 7.4 5.8 6.1 5.9  HGB 10.6* 12.6* 12.5* 11.2* 11.0*  HCT 31.6* 34.3* 34.4* 32.0* 32.9*  MCV 103.6* 105.9* 103.9* 102.2* 100.6*  PLT PLATELET CLUMPS NOTED ON SMEAR, UNABLE TO ESTIMATE 128* 145* 138* 164   Cardiac Enzymes: Recent Labs  Lab 05/24/19 1944  CKTOTAL 193   BNP: BNP (last 3 results) Recent Labs    09/13/18 1522 05/24/19 1129  BNP 2,329.7* 1,733.6*    ProBNP (last 3 results) No results for input(s): PROBNP in the last 8760 hours.  CBG: Recent Labs  Lab 05/30/19 1148 05/30/19 1612 05/30/19 2130 05/31/19 0603 05/31/19 1233  GLUCAP 276* 256* 265*  210* 370*   Signed:  Domenic Polite MD.  [facility] 05/31/2019, 1:35 PM

## 2019-05-31 NOTE — NC FL2 (Signed)
[place] LEVEL OF CARE SCREENING TOOL     IDENTIFICATION  Patient Name: Adrian Andrews Birthdate: 09/09/1950 Sex: male Admission Date (Current Location): 05/24/2019  [facility] and [place] Number:  Herbalist and Address:  [facility] [place], [place] [address], [place], [place] [address]      Provider Number: [id number]  Attending Physician Name and Address:  Domenic Polite, MD  Relative Name and Phone Number:  Kathi Der (friend) [phone]    Current Level of Care: Hospital Recommended Level of Care: [place] Prior Approval Number:    Date Approved/Denied:   PASRR Number:    Discharge Plan: Other (Comment)([facility])    Current Diagnoses: Patient Active Problem List   Diagnosis Date Noted  . CHF (congestive heart failure) ([place]) 05/24/2019  . SOB (shortness of breath)   . Elevated troponin   . Septic shock ([place])   . Acute on chronic diastolic CHF (congestive heart failure) ([place]) 09/16/2018  . Hyperbilirubinemia 09/16/2018  . RLL pneumonia 09/10/2018  . Severe sepsis ([place]) 09/10/2018  . Essential hypertension 09/10/2018  . Dyslipidemia 09/10/2018  . COPD with acute exacerbation ([place]) 02/14/2018  . Nicotine use disorder 02/14/2018  . CKD (chronic kidney disease) stage 3, GFR 30-59 ml/min 02/14/2018  . COPD (chronic obstructive pulmonary disease) ([place]) 02/13/2018  . Lactic acidosis 02/13/2018  . Type 2 diabetes mellitus with other specified complication ([place]) [id number]  . Acute respiratory failure with hypoxia ([place]) 12/21/2017  . CAP (community acquired pneumonia) 12/21/2017  . AKI (acute kidney injury) ([place]) 12/21/2017  . COPD exacerbation ([place]) 12/20/2017  . Heart failure ([place]) 12/20/2017  . Carotid artery stenosis, symptomatic, left 02/21/2017    Orientation RESPIRATION BLADDER Height & Weight     Self, Time, Situation, Place  O2(4L nasal cannula) Continent Weight: 161 lb 3.2 oz (73.1 kg) Height:   5\' 9"  (175.3 cm)  BEHAVIORAL SYMPTOMS/MOOD NEUROLOGICAL BOWEL NUTRITION STATUS      Continent Diet(low sodium, heart healthy diabetic)  AMBULATORY STATUS COMMUNICATION OF NEEDS Skin   Limited Assist Verbally Other (Comment)(MASD left groin)                       Personal Care Assistance Level of Assistance  Bathing, Dressing, Total care, Feeding Bathing Assistance: Limited assistance Feeding assistance: Independent Dressing Assistance: Limited assistance Total Care Assistance: Limited assistance   Functional Limitations Info  Sight, Hearing, Speech Sight Info: Impaired Hearing Info: Adequate Speech Info: Adequate    SPECIAL CARE FACTORS FREQUENCY  PT (By licensed PT), OT (By licensed OT)(Is set up with [facility] for PT and OT)     PT Frequency: min 3x weekly OT Frequency: min 3x weekly            Contractures Contractures Info: Not present    Additional Factors Info  Code Status, Allergies Code Status Info: DNR Allergies Info: Clindamycin/lincomycin, contrast media (iodinated diagnostic agents), metformin and related            Discharge Medications: TAKE these medications   acetaminophen 325 MG tablet Commonly known as: TYLENOL Take 2 tablets (650 mg total) by mouth every 6 (six) hours as needed for mild pain or headache.   albuterol 108 (90 Base) MCG/ACT inhaler Commonly known as: VENTOLIN HFA Inhale 2 puffs into the lungs every 6 (six) hours as needed for wheezing or shortness of breath.   aspirin EC 81 MG tablet Take 81 mg by mouth daily.   atorvastatin  40 MG tablet Commonly known as: LIPITOR Take 20 mg by mouth at bedtime.   Bevespi Aerosphere 9-4.8 MCG/ACT Aero Generic drug: Glycopyrrolate-Formoterol Inhale 2 puffs into the lungs 2 (two) times daily.   budesonide-formoterol 80-4.5 MCG/ACT inhaler Commonly known as: SYMBICORT Inhale 1 puff into the lungs 2 (two) times daily.   calcium carbonate 750 MG chewable tablet Commonly known  as: TUMS EX Chew 1 tablet by mouth daily as needed (For indegestion or nausea).   clopidogrel 75 MG tablet Commonly known as: PLAVIX Take 1 tablet (75 mg total) by mouth daily. Start taking on: Jun 01, 2019   dapagliflozin propanediol 5 MG Tabs tablet Commonly known as: FARXIGA Take 5 mg by mouth daily before breakfast.   fluticasone 50 MCG/ACT nasal spray Commonly known as: FLONASE Place 2 sprays into both nostrils daily.   furosemide 20 MG tablet Commonly known as: LASIX Take 2 tablets (40 mg total) by mouth daily. What changed:   how much to take  when to take this  reasons to take this   gabapentin 300 MG capsule Commonly known as: NEURONTIN Take 1 capsule (300 mg total) by mouth every morning. What changed: Another medication with the same name was removed. Continue taking this medication, and follow the directions you see here.   glipiZIDE 10 MG tablet Commonly known as: GLUCOTROL Take 1 tablet (10 mg total) by mouth 2 (two) times daily before a meal.   guaiFENesin 600 MG 12 hr tablet Commonly known as: MUCINEX Take 1 tablet (600 mg total) by mouth 2 (two) times daily.   HYDROcodone-acetaminophen 5-325 MG tablet Commonly known as: NORCO/VICODIN Take 1 tablet by mouth 2 (two) times daily as needed for moderate pain.   metoprolol tartrate 25 MG tablet Commonly known as: LOPRESSOR Take 1.5 tablets (37.5 mg total) by mouth 2 (two) times daily. What changed: how much to take   pantoprazole 40 MG tablet Commonly known as: PROTONIX Take 1 tablet (40 mg total) by mouth 2 (two) times daily.   polyethylene glycol 17 g packet Commonly known as: MIRALAX / GLYCOLAX Take 17 g by mouth daily as needed for moderate constipation.   sertraline 100 MG tablet Commonly known as: ZOLOFT Take 100 mg by mouth daily.   tamsulosin 0.4 MG Caps capsule Commonly known as: FLOMAX Take 0.4 mg by mouth at bedtime.   traMADol 50 MG tablet Commonly known as:  ULTRAM Take 1 tablet (50 mg total) by mouth daily as needed for severe pain (arthritis). What changed:   when to take this  reasons to take this   traZODone 50 MG tablet Commonly known as: DESYREL Take 50 mg by mouth at bedtime.           Allergies  Allergen Reactions  . Clindamycin/Lincomycin     Throat tightens up  . Contrast Media [Iodinated Diagnostic Agents] Shortness Of Breath    11/20/2004 reaction with CTA CAP  . Metformin And Related Other (See Comments)    Effected kidney function    Relevant Imaging Results:  Relevant Lab Results:   Additional Information SSN: [id number]  Alberteen Sam, LCSW

## 2019-05-31 NOTE — Progress Notes (Addendum)
   He is doing okay.  Clinically euvolemic.  SGLT2 therapy may be helpful long term to help with heart failure prevention and reduce readmission (Dapagloflozin or Empagloflozin).

## 2019-05-31 NOTE — Progress Notes (Signed)
Physical Therapy Treatment Patient Details Name: Adrian Andrews MRN: [id number] DOB: [birth year] Today's Date: 05/31/2019    History of Present Illness 69 y.o. male with PMH of HTN, HLD, carotid artery disease s/p L CEA, COPD, CVA, DM II and former tobacco use who presented dyspnea and elevated troponin. He was previously admitted in Sep 2020 with RLL PNA and elevated trop. Myoview at the time was low risk however showed moderate sized inferior wall defect suspected to be infarct with minimal ischemia    PT Comments    Pt was seen for mobility and used 4LO2 as his base level currently, and was no lower than 89% during walk with pursed lip breathing used.  He is aware of the need to monitor, has a pulse ox for home.  Follow acutely as his condition warrants, and will make progress with strength and endurance to transfer to home therapy at DC.     Follow Up Recommendations  Home health PT;Supervision - Intermittent     Equipment Recommendations  Other (comment)(O2 needed)    Recommendations for Other Services       Precautions / Restrictions Precautions Precautions: Fall Precaution Comments: Watch O2 sats Restrictions Weight Bearing Restrictions: No    Mobility  Bed Mobility               General bed mobility comments: in chair when PT arrived  Transfers Overall transfer level: Needs assistance Equipment used: None Transfers: Sit to/from Stand Sit to Stand: Supervision         General transfer comment: walking short trips alone in his room  Ambulation/Gait Ambulation/Gait assistance: Supervision Gait Distance (Feet): 170 Feet Assistive device: None Gait Pattern/deviations: Step-through pattern;Wide base of support Gait velocity: Decreased Gait velocity interpretation: <1.31 ft/sec, indicative of household ambulator General Gait Details: pt used pursed lip breathing to avoid desaturation, which was 89% upon return to his room on 4L in hallway   Stairs             Wheelchair Mobility    Modified Rankin (Stroke Patients Only)       Balance Overall balance assessment: Needs assistance Sitting-balance support: Feet supported Sitting balance-Leahy Scale: Good     Standing balance support: No upper extremity supported Standing balance-Leahy Scale: Fair                              Cognition Arousal/Alertness: Awake/alert Behavior During Therapy: WFL for tasks assessed/performed Overall Cognitive Status: Within Functional Limits for tasks assessed                                        Exercises General Exercises - Lower Extremity Ankle Circles/Pumps: AROM;5 reps Long Arc Quad: Strengthening;10 reps Heel Slides: Strengthening;10 reps Hip ABduction/ADduction: Strengthening;10 reps    General Comments General comments (skin integrity, edema, etc.): O2 sats 95% at rest and 89% on 4L after exertion with minimized conversation      Pertinent Vitals/Pain Pain Assessment: No/denies pain    Home Living                      Prior Function            PT Goals (current goals can now be found in the care plan section) Acute Rehab PT Goals Patient Stated Goal: to go home Progress towards PT  goals: Progressing toward goals    Frequency    Min 3X/week      PT Plan Current plan remains appropriate    Co-evaluation              AM-PAC PT "6 Clicks" Mobility   Outcome Measure  Help needed turning from your back to your side while in a flat bed without using bedrails?: None Help needed moving from lying on your back to sitting on the side of a flat bed without using bedrails?: None Help needed moving to and from a bed to a chair (including a wheelchair)?: A Little Help needed standing up from a chair using your arms (e.g., wheelchair or bedside chair)?: A Little Help needed to walk in hospital room?: A Little Help needed climbing 3-5 steps with a railing? : A Lot 6 Click Score:  19    End of Session Equipment Utilized During Treatment: Gait belt;Oxygen Activity Tolerance: Treatment limited secondary to medical complications (Comment) Patient left: in chair;with call bell/phone within reach Nurse Communication: Mobility status PT Visit Diagnosis: Unsteadiness on feet (R26.81);Muscle weakness (generalized) (M62.81)     Time: [id number] PT Time Calculation (min) (ACUTE ONLY): 26 min  Charges:  $Gait Training: 8-22 mins $Therapeutic Exercise: 8-22 mins                 Ramond Dial 05/31/2019, 11:31 AM  Mee Hives, PT MS Acute Rehab Dept. Number: [place] and [place]

## 2019-05-31 NOTE — TOC Transition Note (Addendum)
Transition of Care [facility]) - CM/SW Discharge Note   Patient Details  Name: Adrian Andrews MRN: [id number] Date of Birth: Jun 08, 1950  Transition of Care [facility]) CM/SW Contact:  Alberteen Sam, LCSW Phone Number: 05/31/2019, 2:11 PM   Clinical Narrative:     Patient will DC to: [facility] Anticipated DC date: 05/31/19 Transport by: Corey Harold  Per MD patient ready for DC to [facility] . RN, patient, patient's family, and facility notified of DC. Cassie with [facility] also informed of dc today.  Dc summary, fl2, and home health orders have been faxed to [facility] at [phone]. RN given number for report [phone]. CSW spoke with receptionist at [facility] she was unable to reach staff but did inform CSW okay to call [facility]. DC packet on chart. Ambulance transport requested for patient.  CSW signing off.  [place], [place]   Final next level of care: Assisted Living([facility]) Barriers to Discharge: No Barriers Identified   Patient Goals and CMS Choice Patient states their goals for this hospitalization and ongoing recovery are:: to go back to [facility].gov Compare Post Acute Care list provided to:: Patient Choice offered to / list presented to : Patient  Discharge Placement              Patient chooses bed at: ([facility]) Patient to be transferred to facility by: [facility]      Discharge Plan and Services                DME Arranged: Oxygen DME Agency: [employer] Date DME Agency Contacted: 05/27/19 Time DME Agency Contacted: 74 Representative spoke with at DME Agency: [place]: OT, PT [place] Agency: [facility] [place] Date [place]: 05/31/19 Time [place]: 1400 Representative spoke with at [place]: Cassie  Social Determinants of Health ([place]) Interventions     Readmission Risk Interventions Readmission Risk Prevention Plan 09/11/2018   Transportation Screening Complete  HRI or Home Care Consult Complete  Some recent data might be hidden

## 2019-05-31 NOTE — Plan of Care (Signed)
  Problem: Safety: Goal: Ability to remain free from injury will improve Outcome: Completed/Met   Problem: Fluid Volume: Goal: Ability to maintain a balanced intake and output will improve Outcome: Completed/Met   Problem: Nutritional: Goal: Maintenance of adequate nutrition will improve Outcome: Completed/Met   Problem: Skin Integrity: Goal: Risk for impaired skin integrity will decrease Outcome: Completed/Met

## 2019-06-01 DIAGNOSIS — J9611 Chronic respiratory failure with hypoxia: Secondary | ICD-10-CM | POA: Diagnosis not present

## 2019-06-07 DIAGNOSIS — J449 Chronic obstructive pulmonary disease, unspecified: Secondary | ICD-10-CM | POA: Diagnosis not present

## 2019-06-07 DIAGNOSIS — N189 Chronic kidney disease, unspecified: Secondary | ICD-10-CM | POA: Diagnosis not present

## 2019-06-07 DIAGNOSIS — Z79899 Other long term (current) drug therapy: Secondary | ICD-10-CM | POA: Diagnosis not present

## 2019-06-07 DIAGNOSIS — Z66 Do not resuscitate: Secondary | ICD-10-CM | POA: Diagnosis not present

## 2019-06-07 DIAGNOSIS — I10 Essential (primary) hypertension: Secondary | ICD-10-CM | POA: Diagnosis not present

## 2019-06-07 DIAGNOSIS — I509 Heart failure, unspecified: Secondary | ICD-10-CM | POA: Diagnosis not present

## 2019-06-07 DIAGNOSIS — J9610 Chronic respiratory failure, unspecified whether with hypoxia or hypercapnia: Secondary | ICD-10-CM | POA: Diagnosis not present

## 2019-06-07 DIAGNOSIS — E119 Type 2 diabetes mellitus without complications: Secondary | ICD-10-CM | POA: Diagnosis not present

## 2019-06-07 DIAGNOSIS — E875 Hyperkalemia: Secondary | ICD-10-CM | POA: Diagnosis not present

## 2019-06-07 DIAGNOSIS — I214 Non-ST elevation (NSTEMI) myocardial infarction: Secondary | ICD-10-CM | POA: Diagnosis not present

## 2019-06-09 DIAGNOSIS — D649 Anemia, unspecified: Secondary | ICD-10-CM | POA: Diagnosis not present

## 2019-06-10 DIAGNOSIS — E871 Hypo-osmolality and hyponatremia: Secondary | ICD-10-CM | POA: Insufficient documentation

## 2019-06-10 DIAGNOSIS — J441 Chronic obstructive pulmonary disease with (acute) exacerbation: Secondary | ICD-10-CM | POA: Diagnosis not present

## 2019-06-10 DIAGNOSIS — J449 Chronic obstructive pulmonary disease, unspecified: Secondary | ICD-10-CM | POA: Diagnosis not present

## 2019-06-10 DIAGNOSIS — R2689 Other abnormalities of gait and mobility: Secondary | ICD-10-CM | POA: Diagnosis not present

## 2019-06-10 DIAGNOSIS — E875 Hyperkalemia: Secondary | ICD-10-CM | POA: Insufficient documentation

## 2019-06-10 NOTE — Progress Notes (Signed)
Cardiology Office Note   Date:  06/11/2019   ID:  Adrian, Andrews August 15, 1950, MRN [id number]  PCP:  [name], MD  Cardiologist:   Minus Breeding, MD  Chief Complaint  Patient presents with  . Shortness of Breath      History of Present Illness: Adrian Andrews is a 69 y.o. male who presents for follow up post hospitalization for acute respiratory failure.   He had COPD flair and acute diastoic HF.   He declined left heart cath and was managed medically.   He was sent home on 3 liters.  He had AKI with hyperkalemia.  He was hyponatremic.  Echo on 5/11 demonstrated an EF of 55 - 60%.     Interested in nursing home.  He is in assisted living.  He is on oxygen 4 L every day.  He does walk to the dining hall couple times a day.  He has chronic dyspnea with exertion but has not had any acute symptoms.  He denies any PND or orthopnea.  He said no new palpitations, presyncope or syncope.  Denies any chest pressure, neck or arm discomfort.  He has some mild lower extremity edema and his weights have been steady around 168.   Past Medical History:  Diagnosis Date  . Anxiety   . Arthritis    back, right hip  . Chronic kidney disease    stage 3  . COPD (chronic obstructive pulmonary disease) ([place])    on 3L home O2  . Diabetes mellitus without complication ([place])   . Dyspnea   . Hyperlipidemia   . Hypertension   . Peripheral vascular disease ([place])    Carotid artery stenosis, s/p bilat stents  . Skin cancer    basal cell right cheek, left ear  . Stroke [facility])    noted on MRI    Past Surgical History:  Procedure Laterality Date  . ENDARTERECTOMY Left 02/21/2017   Procedure: LEFT CAROTID ENDARTERECTOMY;  Surgeon: Rosetta Posner, MD;  Location: [facility] OR;  Service: Vascular;  Laterality: Left;  . lower extremity stenting    . PATCH ANGIOPLASTY Left 02/21/2017   Procedure: PATCH ANGIOPLASTY OF LEFT CAROTID ARTERY USING HEMASHIELD PLATINUM FINESSE PATCH;  Surgeon: Rosetta Posner, MD;   Location: [facility] OR;  Service: Vascular;  Laterality: Left;     Current Outpatient Medications  Medication Sig Dispense Refill  . acetaminophen (TYLENOL) 325 MG tablet Take 2 tablets (650 mg total) by mouth every 6 (six) hours as needed for mild pain or headache. 30 tablet 0  . albuterol (VENTOLIN HFA) 108 (90 Base) MCG/ACT inhaler Inhale 2 puffs into the lungs every 6 (six) hours as needed for wheezing or shortness of breath. 8 g 11  . aspirin EC 81 MG tablet Take 81 mg by mouth daily.    Marland Kitchen atenolol (TENORMIN) 50 MG tablet Take 50 mg by mouth 2 (two) times daily. 1 tablet Twice Daily    . atorvastatin (LIPITOR) 40 MG tablet Take 20 mg by mouth at bedtime.     . budesonide-formoterol (SYMBICORT) 80-4.5 MCG/ACT inhaler Inhale 1 puff into the lungs 2 (two) times daily.    . calcium carbonate (TUMS EX) 750 MG chewable tablet Chew 1 tablet by mouth daily as needed (For indegestion or nausea).    . clopidogrel (PLAVIX) 75 MG tablet Take 1 tablet (75 mg total) by mouth daily. 30 tablet 0  . dapagliflozin propanediol (FARXIGA) 5 MG TABS tablet Take 5 mg  by mouth daily before breakfast. 30 tablet 0  . fluticasone (FLONASE) 50 MCG/ACT nasal spray Place 2 sprays into both nostrils daily. 9.9 mL 0  . furosemide (LASIX) 20 MG tablet Take 2 tablets (40 mg total) by mouth daily. 30 tablet 0  . gabapentin (NEURONTIN) 300 MG capsule Take 1 capsule (300 mg total) by mouth every morning. 30 capsule 0  . glipiZIDE (GLUCOTROL) 10 MG tablet Take 1 tablet (10 mg total) by mouth 2 (two) times daily before a meal. 60 tablet 3  . Glycopyrrolate-Formoterol (BEVESPI AEROSPHERE) 9-4.8 MCG/ACT AERO Inhale 2 puffs into the lungs 2 (two) times daily. 19.2 g 0  . guaiFENesin (MUCINEX) 600 MG 12 hr tablet Take 1 tablet (600 mg total) by mouth 2 (two) times daily. 30 tablet 0  . HYDROcodone-acetaminophen (NORCO/VICODIN) 5-325 MG tablet Take 1 tablet by mouth 2 (two) times daily as needed for moderate pain.    . metoprolol tartrate  (LOPRESSOR) 25 MG tablet Take 1.5 tablets (37.5 mg total) by mouth 2 (two) times daily. 60 tablet 0  . pantoprazole (PROTONIX) 40 MG tablet Take 1 tablet (40 mg total) by mouth 2 (two) times daily. 60 tablet 0  . polyethylene glycol (MIRALAX / GLYCOLAX) 17 g packet Take 17 g by mouth daily as needed for moderate constipation. 14 each 0  . sertraline (ZOLOFT) 100 MG tablet Take 100 mg by mouth daily.    . tamsulosin (FLOMAX) 0.4 MG CAPS capsule Take 0.4 mg by mouth at bedtime.    . traZODone (DESYREL) 50 MG tablet Take 50 mg by mouth at bedtime.    . furosemide (LASIX) 20 MG tablet Take 1 tablet (20 mg total) by mouth daily as needed (for greater than 2lbs weight gain). 90 tablet 3   No current facility-administered medications for this visit.    Allergies:   Clindamycin/lincomycin, Contrast media [iodinated diagnostic agents], and Metformin and related    ROS:  Please see the history of present illness.   Otherwise, review of systems are positive for none.   All other systems are reviewed and negative.    PHYSICAL EXAM: VS:  BP 128/74   Pulse (!) 59   Temp (!) 97 F (36.1 C)   Ht 5\' 9"  (1.753 m)   Wt 167 lb 9.6 oz (76 kg)   SpO2 96%   BMI 24.75 kg/m  , BMI Body mass index is 24.75 kg/m. GENERAL:  Well appearing NECK:  No jugular venous distention, waveform within normal limits, carotid upstroke brisk and symmetric, no bruits, no thyromegaly LUNGS:  Clear to auscultation bilaterally CHEST: Decreased breath sounds with few expiratory wheezes HEART:  PMI not displaced or sustained,S1 and S2 within normal limits, no S3, no S4, no clicks, no rubs, no murmurs ABD:  Flat, positive bowel sounds normal in frequency in pitch, no bruits, no rebound, no guarding, no midline pulsatile mass, no hepatomegaly, no splenomegaly EXT:  2 plus pulses throughout, mild bilateral leg edema, no cyanosis no clubbing  EKG:  EKG is ordered today. The ekg ordered today demonstrates sinus rhythm, rate 59,  axis right axis deviation, limb lead reversal, unusual P wave axis indicates this, nonspecific inferior and anterior T wave flattening.   Recent Labs: 09/22/2018: Magnesium 1.7 05/24/2019: ALT 21; B Natriuretic Peptide 1,733.6 05/31/2019: BUN 40; Creatinine, Ser 1.44; Hemoglobin 11.0; Platelets 164; Potassium 4.7; Sodium 132    Lipid Panel    Component Value Date/Time   CHOL 81 05/26/2019 0839   TRIG 87 05/26/2019  0839   HDL 28 (L) 05/26/2019 0839   CHOLHDL 2.9 05/26/2019 0839   VLDL 17 05/26/2019 0839   LDLCALC 36 05/26/2019 0839      Wt Readings from Last 3 Encounters:  06/11/19 167 lb 9.6 oz (76 kg)  05/31/19 161 lb 3.2 oz (73.1 kg)  09/28/18 157 lb 11.2 oz (71.5 kg)      Other studies Reviewed: Additional studies/ records that were reviewed today include: None. Review of the above records demonstrates:  Please see elsewhere in the note.     ASSESSMENT AND PLAN:  Acute diastolic HF:    We talked a long time about volume management.  He has salt restriction.  He is getting daily weights.  He is going to take as needed Lasix 20 mg for 2 pound weight gain.  I am going to get the most recent blood work that he says was drawn to make sure his electrolytes were okay but this was drawn by his primary provider.  AKI: Again this will be checked as above.  He did have hyperkalemia and hyponatremia in the hospital.  He will need to have close follow-up by his primary providers of his basic metabolic profile.  HTN: Blood pressures well controlled.  No change in therapy.  COPD: Per his primary provider.   Current medicines are reviewed at length with the patient today.  The patient does not have concerns regarding medicines.  The following changes have been made: As above  Labs/ tests ordered today include: None  Orders Placed This Encounter  Procedures  . EKG 12-Lead     Disposition:   FU with APP in about 3 months.   Signed, Minus Breeding, MD  06/11/2019 1:01 PM     [facility]

## 2019-06-11 ENCOUNTER — Other Ambulatory Visit: Payer: Self-pay

## 2019-06-11 ENCOUNTER — Encounter: Payer: Self-pay | Admitting: Cardiology

## 2019-06-11 ENCOUNTER — Ambulatory Visit (INDEPENDENT_AMBULATORY_CARE_PROVIDER_SITE_OTHER): Payer: Medicare Other | Admitting: Cardiology

## 2019-06-11 VITALS — BP 128/74 | HR 59 | Temp 97.0°F | Ht 69.0 in | Wt 167.6 lb

## 2019-06-11 DIAGNOSIS — E875 Hyperkalemia: Secondary | ICD-10-CM | POA: Diagnosis not present

## 2019-06-11 DIAGNOSIS — E871 Hypo-osmolality and hyponatremia: Secondary | ICD-10-CM

## 2019-06-11 DIAGNOSIS — N179 Acute kidney failure, unspecified: Secondary | ICD-10-CM

## 2019-06-11 DIAGNOSIS — I5031 Acute diastolic (congestive) heart failure: Secondary | ICD-10-CM | POA: Diagnosis not present

## 2019-06-11 DIAGNOSIS — M6281 Muscle weakness (generalized): Secondary | ICD-10-CM | POA: Diagnosis not present

## 2019-06-11 DIAGNOSIS — R293 Abnormal posture: Secondary | ICD-10-CM | POA: Diagnosis not present

## 2019-06-11 DIAGNOSIS — I10 Essential (primary) hypertension: Secondary | ICD-10-CM

## 2019-06-11 DIAGNOSIS — R278 Other lack of coordination: Secondary | ICD-10-CM | POA: Diagnosis not present

## 2019-06-11 DIAGNOSIS — J449 Chronic obstructive pulmonary disease, unspecified: Secondary | ICD-10-CM | POA: Diagnosis not present

## 2019-06-11 MED ORDER — FUROSEMIDE 20 MG PO TABS: 20.0000 mg | ORAL_TABLET | Freq: Every day | ORAL | 3 refills | Status: AC | PRN

## 2019-06-11 NOTE — Patient Instructions (Signed)
Medication Instructions:  ADD LASIX 20MG  DAILY AS NEEDED FOR GREATER THAN 2LB WEIGHT GAIN *If you need a refill on your cardiac medications before your next appointment, please call your pharmacy*  Lab Work: NONE ORDERED THIS VISIT  Testing/Procedures: NONE ORDERED THIS VISIT  Follow-Up: At [facility], you and your health needs are our priority.  As part of our continuing mission to provide you with exceptional heart care, we have created designated Provider Care Teams.  These Care Teams include your primary Cardiologist (physician) and Advanced Practice Providers (APPs -  Physician Assistants and Nurse Practitioners) who all work together to provide you with the care you need, when you need it.  Your next appointment:   Thursday August 26th at 11:45am  The format for your next appointment:   In Person  Provider:   Almyra Deforest, [facility]

## 2019-06-14 DIAGNOSIS — J449 Chronic obstructive pulmonary disease, unspecified: Secondary | ICD-10-CM | POA: Diagnosis not present

## 2019-06-14 DIAGNOSIS — M6281 Muscle weakness (generalized): Secondary | ICD-10-CM | POA: Diagnosis not present

## 2019-06-14 DIAGNOSIS — R293 Abnormal posture: Secondary | ICD-10-CM | POA: Diagnosis not present

## 2019-06-14 DIAGNOSIS — R278 Other lack of coordination: Secondary | ICD-10-CM | POA: Diagnosis not present

## 2019-06-14 DIAGNOSIS — R2689 Other abnormalities of gait and mobility: Secondary | ICD-10-CM | POA: Diagnosis not present

## 2019-06-14 DIAGNOSIS — J441 Chronic obstructive pulmonary disease with (acute) exacerbation: Secondary | ICD-10-CM | POA: Diagnosis not present

## 2019-07-01 DIAGNOSIS — J9611 Chronic respiratory failure with hypoxia: Secondary | ICD-10-CM | POA: Diagnosis not present

## 2019-08-03 DIAGNOSIS — M25512 Pain in left shoulder: Secondary | ICD-10-CM | POA: Diagnosis not present

## 2019-08-03 DIAGNOSIS — M25511 Pain in right shoulder: Secondary | ICD-10-CM | POA: Diagnosis not present

## 2019-08-09 DIAGNOSIS — N189 Chronic kidney disease, unspecified: Secondary | ICD-10-CM | POA: Diagnosis not present

## 2019-08-09 DIAGNOSIS — J9611 Chronic respiratory failure with hypoxia: Secondary | ICD-10-CM | POA: Diagnosis not present

## 2019-08-09 DIAGNOSIS — I509 Heart failure, unspecified: Secondary | ICD-10-CM | POA: Diagnosis not present

## 2019-08-09 DIAGNOSIS — I209 Angina pectoris, unspecified: Secondary | ICD-10-CM | POA: Diagnosis not present

## 2019-08-09 DIAGNOSIS — J449 Chronic obstructive pulmonary disease, unspecified: Secondary | ICD-10-CM | POA: Diagnosis not present

## 2019-08-09 DIAGNOSIS — Z66 Do not resuscitate: Secondary | ICD-10-CM | POA: Diagnosis not present

## 2019-08-09 DIAGNOSIS — I214 Non-ST elevation (NSTEMI) myocardial infarction: Secondary | ICD-10-CM | POA: Diagnosis not present

## 2019-08-09 DIAGNOSIS — E119 Type 2 diabetes mellitus without complications: Secondary | ICD-10-CM | POA: Diagnosis not present

## 2019-08-09 DIAGNOSIS — I10 Essential (primary) hypertension: Secondary | ICD-10-CM | POA: Diagnosis not present

## 2019-08-09 DIAGNOSIS — E875 Hyperkalemia: Secondary | ICD-10-CM | POA: Diagnosis not present

## 2019-08-23 DIAGNOSIS — I10 Essential (primary) hypertension: Secondary | ICD-10-CM | POA: Diagnosis not present

## 2019-08-23 DIAGNOSIS — I2510 Atherosclerotic heart disease of native coronary artery without angina pectoris: Secondary | ICD-10-CM | POA: Diagnosis not present

## 2019-08-23 DIAGNOSIS — J449 Chronic obstructive pulmonary disease, unspecified: Secondary | ICD-10-CM | POA: Diagnosis not present

## 2019-08-23 DIAGNOSIS — J9610 Chronic respiratory failure, unspecified whether with hypoxia or hypercapnia: Secondary | ICD-10-CM | POA: Diagnosis not present

## 2019-08-23 DIAGNOSIS — F064 Anxiety disorder due to known physiological condition: Secondary | ICD-10-CM | POA: Diagnosis not present

## 2019-09-09 ENCOUNTER — Ambulatory Visit (INDEPENDENT_AMBULATORY_CARE_PROVIDER_SITE_OTHER): Payer: Medicare Other | Admitting: Physician Assistant

## 2019-09-09 ENCOUNTER — Other Ambulatory Visit: Payer: Self-pay

## 2019-09-09 ENCOUNTER — Encounter: Payer: Self-pay | Admitting: Physician Assistant

## 2019-09-09 VITALS — BP 160/90 | HR 66 | Ht 69.0 in | Wt 171.6 lb

## 2019-09-09 DIAGNOSIS — M25511 Pain in right shoulder: Secondary | ICD-10-CM

## 2019-09-09 DIAGNOSIS — E119 Type 2 diabetes mellitus without complications: Secondary | ICD-10-CM

## 2019-09-09 DIAGNOSIS — J449 Chronic obstructive pulmonary disease, unspecified: Secondary | ICD-10-CM

## 2019-09-09 DIAGNOSIS — N183 Chronic kidney disease, stage 3 unspecified: Secondary | ICD-10-CM

## 2019-09-09 DIAGNOSIS — I5031 Acute diastolic (congestive) heart failure: Secondary | ICD-10-CM

## 2019-09-09 NOTE — Patient Instructions (Signed)
Medication Instructions:  Increase lasix to 40mg  daily  *If you need a refill on your cardiac medications before your next appointment, please call your pharmacy*   Lab Work: BMET- Labwork in 1 week- lab requisition sent with patient  If you have labs (blood work) drawn today and your tests are completely normal, you will receive your results only by: Marland Kitchen MyChart Message (if you have MyChart) OR . A paper copy in the mail If you have any lab test that is abnormal or we need to change your treatment, we will call you to review the results.   Testing/Procedures: None Ordered At This Time.     Follow-Up: At [facility], you and your health needs are our priority.  As part of our continuing mission to provide you with exceptional heart care, we have created designated Provider Care Teams.  These Care Teams include your primary Cardiologist (physician) and Advanced Practice Providers (APPs -  Physician Assistants and Nurse Practitioners) who all work together to provide you with the care you need, when you need it.  We recommend signing up for the patient portal called "MyChart".  Sign up information is provided on this After Visit Summary.  MyChart is used to connect with patients for Virtual Visits (Telemedicine).  Patients are able to view lab/test results, encounter notes, upcoming appointments, etc.  Non-urgent messages can be sent to your provider as well.   To learn more about what you can do with MyChart, go to [email or web address].    Your next appointment:   09/23/2019 at 11 :15 am  The format for your next appointment:   In Person  Provider:    Almyra Deforest, PA-C

## 2019-09-09 NOTE — Progress Notes (Signed)
Cardiology Office Note  Date:  09/09/2019   ID:  Tabor, Denham 1950/03/21, MRN [id number]  PCP:  [name], MD  Cardiologist:  Dr. Lolly Mustache  _____________  3 month f/u  _____________   History of Present Illness: Adrian Andrews is a 69 y.o. male who has a history of COPD on 3L O2, DM2, PAD, carotid artery stenosis s/p b/l stents, stroke, CKD stage 3, chronic diastolic CHF, NSTEMI during admission 05/2019 but declined LHC and managed medically, chronic dyspnea who is being seen for 3 month follow-up. He had a an echo 05/25/2019 showing EF 55-60%. He was last seen 06/11/19 in hospital f/u and denied any changes, living in an assisted living, on 4L O2.  He was discharged on 40 mg daily of Lasix with instruction of taking extra 20 mg on as-needed basis.   Today, he presents today for 75-month follow-up.  He continued to stay on 4 L/min oxygen.  He says his breathing has been unchanged.  On physical exam, he has 1+ ankle edema however no significant pretibial edema.  His lung exam revealed diffuse rhonchi, however no crackles or wheezing.  For some reason, instead of 40 mg daily of Lasix, he has been taking 20 mg daily of Lasix at the nursing facility.  I recommended him to resume 40 mg daily of Lasix.  I would like to keep him on the drier side in order to prevent him from having worsening breathing issue.  He says he has been having more right shoulder pain.  He is concerned that the right shoulder pain may be cardiac in nature and wished to have the cardiac catheterization he turned down before.  However when I manipulated his right shoulder, this is clearly reproducible with body movement.  EKG showed no obvious ischemic changes.  I suspect his right shoulder discomfort is more musculoskeletal in nature.  I discussed with the patient about cardiac catheterization and informed him that cardiac catheterization may not fix his shoulder issue.  When we further delve into the potential risks  associated with cardiac catheterization, he is concerned that that his contrast dye allergy can potentially make things even worse than before.  We eventually decided to hold off on cardiac catheterization at this time.  As mentioned before, I decided to increase his Lasix back up to the 40 mg daily.  I recommend a repeat basic metabolic panel in 1 to 2 weeks and follow-up in 2 weeks. _____________   Past Medical History:  Diagnosis Date  . Anxiety   . Arthritis    back, right hip  . Chronic kidney disease    stage 3  . COPD (chronic obstructive pulmonary disease) ([place])    on 3L home O2  . Diabetes mellitus without complication ([place])   . Dyspnea   . Hyperlipidemia   . Hypertension   . Peripheral vascular disease ([place])    Carotid artery stenosis, s/p bilat stents  . Skin cancer    basal cell right cheek, left ear  . Stroke [facility])    noted on MRI   Past Surgical History:  Procedure Laterality Date  . ENDARTERECTOMY Left 02/21/2017   Procedure: LEFT CAROTID ENDARTERECTOMY;  Surgeon: Rosetta Posner, MD;  Location: [facility] OR;  Service: Vascular;  Laterality: Left;  . lower extremity stenting    . PATCH ANGIOPLASTY Left 02/21/2017   Procedure: PATCH ANGIOPLASTY OF LEFT CAROTID ARTERY USING HEMASHIELD PLATINUM FINESSE PATCH;  Surgeon: Rosetta Posner, MD;  Location: [facility]  OR;  Service: Vascular;  Laterality: Left;   _____________  Current Outpatient Medications  Medication Sig Dispense Refill  . acetaminophen (TYLENOL) 325 MG tablet Take 2 tablets (650 mg total) by mouth every 6 (six) hours as needed for mild pain or headache. 30 tablet 0  . albuterol (VENTOLIN HFA) 108 (90 Base) MCG/ACT inhaler Inhale 2 puffs into the lungs every 6 (six) hours as needed for wheezing or shortness of breath. 8 g 11  . aspirin EC 81 MG tablet Take 81 mg by mouth daily.    Marland Kitchen atenolol (TENORMIN) 50 MG tablet Take 50 mg by mouth 2 (two) times daily. 1 tablet Twice Daily    . atorvastatin (LIPITOR) 40 MG tablet Take 20 mg  by mouth at bedtime.     . budesonide-formoterol (SYMBICORT) 80-4.5 MCG/ACT inhaler Inhale 1 puff into the lungs 2 (two) times daily.    . calcium carbonate (TUMS EX) 750 MG chewable tablet Chew 1 tablet by mouth daily as needed (For indegestion or nausea).    . clopidogrel (PLAVIX) 75 MG tablet Take 1 tablet (75 mg total) by mouth daily. 30 tablet 0  . dapagliflozin propanediol (FARXIGA) 5 MG TABS tablet Take 5 mg by mouth daily before breakfast. 30 tablet 0  . fluticasone (FLONASE) 50 MCG/ACT nasal spray Place 2 sprays into both nostrils daily. 9.9 mL 0  . furosemide (LASIX) 20 MG tablet Take 2 tablets (40 mg total) by mouth daily. 30 tablet 0  . furosemide (LASIX) 20 MG tablet Take 1 tablet (20 mg total) by mouth daily as needed (for greater than 2lbs weight gain). 90 tablet 3  . gabapentin (NEURONTIN) 300 MG capsule Take 1 capsule (300 mg total) by mouth every morning. 30 capsule 0  . glipiZIDE (GLUCOTROL) 10 MG tablet Take 1 tablet (10 mg total) by mouth 2 (two) times daily before a meal. 60 tablet 3  . Glycopyrrolate-Formoterol (BEVESPI AEROSPHERE) 9-4.8 MCG/ACT AERO Inhale 2 puffs into the lungs 2 (two) times daily. 19.2 g 0  . guaiFENesin (MUCINEX) 600 MG 12 hr tablet Take 1 tablet (600 mg total) by mouth 2 (two) times daily. 30 tablet 0  . HYDROcodone-acetaminophen (NORCO/VICODIN) 5-325 MG tablet Take 1 tablet by mouth 2 (two) times daily as needed for moderate pain.    . metoprolol tartrate (LOPRESSOR) 25 MG tablet Take 1.5 tablets (37.5 mg total) by mouth 2 (two) times daily. 60 tablet 0  . pantoprazole (PROTONIX) 40 MG tablet Take 1 tablet (40 mg total) by mouth 2 (two) times daily. 60 tablet 0  . polyethylene glycol (MIRALAX / GLYCOLAX) 17 g packet Take 17 g by mouth daily as needed for moderate constipation. 14 each 0  . sertraline (ZOLOFT) 100 MG tablet Take 100 mg by mouth daily.    . tamsulosin (FLOMAX) 0.4 MG CAPS capsule Take 0.4 mg by mouth at bedtime.    . traZODone (DESYREL)  50 MG tablet Take 50 mg by mouth at bedtime.     No current facility-administered medications for this visit.   _____________   Allergies:   Clindamycin/lincomycin, Contrast media [iodinated diagnostic agents], and Metformin and related  _____________   Social History:  The patient  reports that he quit smoking about 16 months ago. His smoking use included cigarettes. He has a 120.00 pack-year smoking history. He has never used smokeless tobacco. He reports that he does not drink alcohol and does not use drugs.  _____________   Family History:  The patient's family history is  not on file.  _____________   ROS:  Please see the history of present illness.   Positive for ankle edema,   All other systems are reviewed and negative.  _____________   PHYSICAL EXAM: VS:  There were no vitals taken for this visit. , BMI There is no height or weight on file to calculate BMI. GEN: Well nourished, well developed, in no acute distress  HEENT: normal  Neck: no JVD, carotid bruits, or masses Cardiac: RRR; no murmurs, rubs, or gallops. No clubbing, cyanosis, edema.  Radials/DP/PT 2+ and equal bilaterally.  Respiratory:  clear to auscultation bilaterally, normal work of breathing GI: soft, nontender, nondistended, + BS MS: no deformity or atrophy  Skin: warm and dry, no rash Neuro:  Strength and sensation are intact Psych: euthymic mood, full affect _____________  EKG:   The ekg ordered today shows NSR without significant ST-T wave changes  Recent Labs: 09/22/2018: Magnesium 1.7 05/24/2019: ALT 21; B Natriuretic Peptide 1,733.6 05/31/2019: BUN 40; Creatinine, Ser 1.44; Hemoglobin 11.0; Platelets 164; Potassium 4.7; Sodium 132  05/26/2019: Cholesterol 81; HDL 28; LDL Cholesterol 36; Total CHOL/HDL Ratio 2.9; Triglycerides 87; VLDL 17  CrCl cannot be calculated (Patient's most recent lab result is older than the maximum 21 days allowed.).  Wt Readings from Last 3 Encounters:  06/11/19 167 lb 9.6 oz  (76 kg)  05/31/19 161 lb 3.2 oz (73.1 kg)  09/28/18 157 lb 11.2 oz (71.5 kg)    _____________   ASSESSMENT AND PLAN:  Chronic diastolic CHF - Echo 04/345 showed preserved E - Lopressor 25 mg daily - increase lasix to 40mg  daily. Obtain BMET in 1 week. Follow up in 2 weeks  Right shoulder pain -Patient is concerned that his right shoulder pain may be cardiac in nature and wish to reconsider cardiac catheterization, however when I manipulated the right arm, his shoulder pain can be reproducible with arm elevation. -I am not confident that cardiac catheterization will fix his shoulder pain as I suspect it is musculoskeletal in nature. -Patient likely has underlying coronary artery disease anyway, therefore it is reasonable to reconsider cardiac catheterization.  However when I discussed with him about benefit and risk of the procedure, he declined cardiac catheterization again due to concern of kidney injury associated with contrast dye.  HLD - atorvastatin 40 mg daily  H/o of NSTEMI declined cath - aspirin, plavix - no chest pain, sob  HTN - BP elevated today, however it was previously normal during previous visit  CKD stage 3  - with history of hyponatremia and hyperkalemia  COPD - per PCP  Disposition:   FU with in 2 weeks   Signed, Almyra Deforest PA Pager: [phone]     _____________ [facility] [address] [place] [address]  [phone] (office) [phone] (fax)

## 2019-09-09 NOTE — Progress Notes (Deleted)
Cardiology Office Note  Date:  09/09/2019   ID:  Adrian, Andrews 11/04/1950, MRN [id number]  PCP:  [name], MD  Cardiologist:  Dr. Lolly Mustache  _____________  3 month f/u  _____________   History of Present Illness: Adrian Andrews is a 69 y.o. male who has a history of COPD on 3L O2, DM2, PAD, carotid artery stenosis s/p b/l stents, stroke, CKD stage 3, chronic diastolic CHF, NSTEMI during admission 05/2019 but declined LHC and managed medically, chronic dyspnea who is being seen for 3 month follow-up. He had a an echo 05/25/2019 showing EF 55-60%. He was last seen 06/11/19 in hospital f/u and denied any changes, living in an assisted living, on 4L O2. Taking lasix 20 mg as needed for volume status. No changes were made.  Today, he denies symptoms of palpitations, chest pain, shortness of breath, orthopnea, PND, lower extremity edema, claudication, dizziness, presyncope, syncope, bleeding, or neurologic sequela. The patient is tolerating medications without difficulties and is otherwise without complaint today.  _____________   Past Medical History:  Diagnosis Date  . Anxiety   . Arthritis    back, right hip  . Chronic kidney disease    stage 3  . COPD (chronic obstructive pulmonary disease) ([place])    on 3L home O2  . Diabetes mellitus without complication ([place])   . Dyspnea   . Hyperlipidemia   . Hypertension   . Peripheral vascular disease ([place])    Carotid artery stenosis, s/p bilat stents  . Skin cancer    basal cell right cheek, left ear  . Stroke [facility])    noted on MRI   Past Surgical History:  Procedure Laterality Date  . ENDARTERECTOMY Left 02/21/2017   Procedure: LEFT CAROTID ENDARTERECTOMY;  Surgeon: Rosetta Posner, MD;  Location: [facility] OR;  Service: Vascular;  Laterality: Left;  . lower extremity stenting    . PATCH ANGIOPLASTY Left 02/21/2017   Procedure: PATCH ANGIOPLASTY OF LEFT CAROTID ARTERY USING HEMASHIELD PLATINUM FINESSE PATCH;  Surgeon: Rosetta Posner, MD;   Location: [facility] OR;  Service: Vascular;  Laterality: Left;   _____________  Current Outpatient Medications  Medication Sig Dispense Refill  . acetaminophen (TYLENOL) 325 MG tablet Take 2 tablets (650 mg total) by mouth every 6 (six) hours as needed for mild pain or headache. 30 tablet 0  . albuterol (VENTOLIN HFA) 108 (90 Base) MCG/ACT inhaler Inhale 2 puffs into the lungs every 6 (six) hours as needed for wheezing or shortness of breath. 8 g 11  . aspirin EC 81 MG tablet Take 81 mg by mouth daily.    Marland Kitchen atenolol (TENORMIN) 50 MG tablet Take 50 mg by mouth 2 (two) times daily. 1 tablet Twice Daily    . atorvastatin (LIPITOR) 40 MG tablet Take 20 mg by mouth at bedtime.     . budesonide-formoterol (SYMBICORT) 80-4.5 MCG/ACT inhaler Inhale 1 puff into the lungs 2 (two) times daily.    . calcium carbonate (TUMS EX) 750 MG chewable tablet Chew 1 tablet by mouth daily as needed (For indegestion or nausea).    . clopidogrel (PLAVIX) 75 MG tablet Take 1 tablet (75 mg total) by mouth daily. 30 tablet 0  . dapagliflozin propanediol (FARXIGA) 5 MG TABS tablet Take 5 mg by mouth daily before breakfast. 30 tablet 0  . fluticasone (FLONASE) 50 MCG/ACT nasal spray Place 2 sprays into both nostrils daily. 9.9 mL 0  . furosemide (LASIX) 20 MG tablet Take 2 tablets (40 mg  total) by mouth daily. 30 tablet 0  . furosemide (LASIX) 20 MG tablet Take 1 tablet (20 mg total) by mouth daily as needed (for greater than 2lbs weight gain). 90 tablet 3  . gabapentin (NEURONTIN) 300 MG capsule Take 1 capsule (300 mg total) by mouth every morning. 30 capsule 0  . glipiZIDE (GLUCOTROL) 10 MG tablet Take 1 tablet (10 mg total) by mouth 2 (two) times daily before a meal. 60 tablet 3  . Glycopyrrolate-Formoterol (BEVESPI AEROSPHERE) 9-4.8 MCG/ACT AERO Inhale 2 puffs into the lungs 2 (two) times daily. 19.2 g 0  . guaiFENesin (MUCINEX) 600 MG 12 hr tablet Take 1 tablet (600 mg total) by mouth 2 (two) times daily. 30 tablet 0  .  HYDROcodone-acetaminophen (NORCO/VICODIN) 5-325 MG tablet Take 1 tablet by mouth 2 (two) times daily as needed for moderate pain.    . metoprolol tartrate (LOPRESSOR) 25 MG tablet Take 1.5 tablets (37.5 mg total) by mouth 2 (two) times daily. 60 tablet 0  . pantoprazole (PROTONIX) 40 MG tablet Take 1 tablet (40 mg total) by mouth 2 (two) times daily. 60 tablet 0  . polyethylene glycol (MIRALAX / GLYCOLAX) 17 g packet Take 17 g by mouth daily as needed for moderate constipation. 14 each 0  . sertraline (ZOLOFT) 100 MG tablet Take 100 mg by mouth daily.    . tamsulosin (FLOMAX) 0.4 MG CAPS capsule Take 0.4 mg by mouth at bedtime.    . traZODone (DESYREL) 50 MG tablet Take 50 mg by mouth at bedtime.     No current facility-administered medications for this visit.   _____________   Allergies:   Clindamycin/lincomycin, Contrast media [iodinated diagnostic agents], and Metformin and related  _____________   Social History:  The patient  reports that he quit smoking about 16 months ago. His smoking use included cigarettes. He has a 120.00 pack-year smoking history. He has never used smokeless tobacco. He reports that he does not drink alcohol and does not use drugs.  _____________   Family History:  The patient's family history is not on file.  _____________   ROS:  Please see the history of present illness.   Positive for ***,   All other systems are reviewed and negative.  _____________   PHYSICAL EXAM: VS:  There were no vitals taken for this visit. , BMI There is no height or weight on file to calculate BMI. GEN: Well nourished, well developed, in no acute distress  HEENT: normal  Neck: no JVD, carotid bruits, or masses Cardiac: RRR; no murmurs, rubs, or gallops. No clubbing, cyanosis, edema.  Radials/DP/PT 2+ and equal bilaterally.  Respiratory:  clear to auscultation bilaterally, normal work of breathing GI: soft, nontender, nondistended, + BS MS: no deformity or atrophy  Skin: warm  and dry, no rash Neuro:  Strength and sensation are intact Psych: euthymic mood, full affect _____________  EKG:   The ekg ordered today shows ***  Recent Labs: 09/22/2018: Magnesium 1.7 05/24/2019: ALT 21; B Natriuretic Peptide 1,733.6 05/31/2019: BUN 40; Creatinine, Ser 1.44; Hemoglobin 11.0; Platelets 164; Potassium 4.7; Sodium 132  05/26/2019: Cholesterol 81; HDL 28; LDL Cholesterol 36; Total CHOL/HDL Ratio 2.9; Triglycerides 87; VLDL 17  CrCl cannot be calculated (Patient's most recent lab result is older than the maximum 21 days allowed.).  Wt Readings from Last 3 Encounters:  06/11/19 167 lb 9.6 oz (76 kg)  05/31/19 161 lb 3.2 oz (73.1 kg)  09/28/18 157 lb 11.2 oz (71.5 kg)    _____________  ASSESSMENT AND PLAN:  Chronic diastolic CHF - Echo 06/7735 showed preserved EF - lasix 20 mg PRN for weight gain - Lopressor 25 mg daily  HLD - atorvastatin 40 mg daily  H/o of NSTEMI declined cath - aspirin, plavix - no chest pain, sob  HTN -   CKD stage 3  - with history of hyponatremia and hyperkalemia  COPD - per PCP  Disposition:   FU with ***   Signed, Cadence Ninfa Meeker, NP 09/09/2019 8:40 AM    _____________ [facility] [address] [place] [address]  [phone] (office) [phone] (fax)

## 2019-09-11 ENCOUNTER — Encounter: Payer: Self-pay | Admitting: Physician Assistant

## 2019-09-15 DIAGNOSIS — I5031 Acute diastolic (congestive) heart failure: Secondary | ICD-10-CM | POA: Diagnosis not present

## 2019-09-15 DIAGNOSIS — J449 Chronic obstructive pulmonary disease, unspecified: Secondary | ICD-10-CM | POA: Diagnosis not present

## 2019-09-16 DIAGNOSIS — D489 Neoplasm of uncertain behavior, unspecified: Secondary | ICD-10-CM | POA: Diagnosis not present

## 2019-09-16 DIAGNOSIS — L578 Other skin changes due to chronic exposure to nonionizing radiation: Secondary | ICD-10-CM | POA: Diagnosis not present

## 2019-09-16 DIAGNOSIS — D1801 Hemangioma of skin and subcutaneous tissue: Secondary | ICD-10-CM | POA: Diagnosis not present

## 2019-09-16 DIAGNOSIS — C44311 Basal cell carcinoma of skin of nose: Secondary | ICD-10-CM | POA: Diagnosis not present

## 2019-09-16 DIAGNOSIS — L570 Actinic keratosis: Secondary | ICD-10-CM | POA: Diagnosis not present

## 2019-09-16 DIAGNOSIS — L814 Other melanin hyperpigmentation: Secondary | ICD-10-CM | POA: Diagnosis not present

## 2019-09-16 DIAGNOSIS — L821 Other seborrheic keratosis: Secondary | ICD-10-CM | POA: Diagnosis not present

## 2019-09-23 ENCOUNTER — Ambulatory Visit (INDEPENDENT_AMBULATORY_CARE_PROVIDER_SITE_OTHER): Payer: Medicare Other | Admitting: Physician Assistant

## 2019-09-23 ENCOUNTER — Encounter: Payer: Self-pay | Admitting: Physician Assistant

## 2019-09-23 ENCOUNTER — Other Ambulatory Visit: Payer: Self-pay

## 2019-09-23 VITALS — BP 130/70 | Ht 69.0 in | Wt 172.0 lb

## 2019-09-23 DIAGNOSIS — J449 Chronic obstructive pulmonary disease, unspecified: Secondary | ICD-10-CM | POA: Diagnosis not present

## 2019-09-23 DIAGNOSIS — N183 Chronic kidney disease, stage 3 unspecified: Secondary | ICD-10-CM

## 2019-09-23 DIAGNOSIS — I5032 Chronic diastolic (congestive) heart failure: Secondary | ICD-10-CM

## 2019-09-23 DIAGNOSIS — I6523 Occlusion and stenosis of bilateral carotid arteries: Secondary | ICD-10-CM

## 2019-09-23 DIAGNOSIS — E119 Type 2 diabetes mellitus without complications: Secondary | ICD-10-CM

## 2019-09-23 NOTE — Progress Notes (Signed)
Cardiology Office Note:    Date:  09/25/2019   ID:  Adrian Andrews, DOB 14-May-1950, MRN [id number]  PCP:  [name], MD  [facility] Cardiologist:  Minus Breeding, MD  [place] Electrophysiologist:  None   Referring MD: [name], MD   Chief Complaint  Patient presents with  . Follow-up    seen for Dr. Percival Spanish    History of Present Illness:    Adrian Andrews is a 69 y.o. male with a hx of COPD on 3L O2, DM2, PAD, carotid artery stenosis s/p b/l stents, stroke, CKD stage 3, chronic diastolic CHF, NSTEMI during admission 05/2019 but declined [facility] and managed medically, chronic dyspnea who is being seen for 3 month follow-up. He had a an echo 05/25/2019 showing EF 55-60%. He was last seen 06/11/19 in hospital f/u and denied any changes, living in an assisted living, on 4L O2.  He was discharged on 40 mg daily of Lasix with instruction of taking extra 20 mg on as-needed basis.   I last saw the patient on 09/09/2019 for shortness of breath, ankle edema and the right shoulder pain.  Right shoulder pain was clearly musculoskeletal in nature.  I suspect the patient was volume overloaded at the time and the increase the Lasix to 40 mg daily from the previous 20 mg daily dosing.  He presents today for follow-up.  His oxygen requirement has decreased from the previous 4 mL/min down to 3 L/min.  He appears to be euvolemic on today's exam.  He continued to have shoulder pain that is reproducible with arm raise.  I recommended continue on the current therapy.  He had lab work a few weeks ago after increasing on the dose of the diuretic, however I do not have the record.  We will requested lab result.  Otherwise he can follow-up in 3 months.  Addendum: Received outside lab results from 09/15/2019, creatinine 1.5 appears to be stable, potassium 5.2 which is borderline high.  Patient is not on potassium supplement.  We will continue on the current therapy and repeat basic metabolic panel in 1  month.   Past Medical History:  Diagnosis Date  . Anxiety   . Arthritis    back, right hip  . Chronic kidney disease    stage 3  . COPD (chronic obstructive pulmonary disease) ([place])    on 3L home O2  . Diabetes mellitus without complication ([place])   . Dyspnea   . Hyperlipidemia   . Hypertension   . Peripheral vascular disease ([place])    Carotid artery stenosis, s/p bilat stents  . Skin cancer    basal cell right cheek, left ear  . Stroke [facility])    noted on MRI    Past Surgical History:  Procedure Laterality Date  . ENDARTERECTOMY Left 02/21/2017   Procedure: LEFT CAROTID ENDARTERECTOMY;  Surgeon: Rosetta Posner, MD;  Location: [facility] OR;  Service: Vascular;  Laterality: Left;  . lower extremity stenting    . PATCH ANGIOPLASTY Left 02/21/2017   Procedure: PATCH ANGIOPLASTY OF LEFT CAROTID ARTERY USING HEMASHIELD PLATINUM FINESSE PATCH;  Surgeon: Rosetta Posner, MD;  Location: [facility];  Service: Vascular;  Laterality: Left;    Current Medications: No outpatient medications have been marked as taking for the 09/23/19 encounter (Office Visit) with Adrian Andrews, [place].     Allergies:   Clindamycin/lincomycin, Contrast media [iodinated diagnostic agents], and Metformin and related   Social History   Socioeconomic History  . Marital status:  Single    Spouse name: Not on file  . Number of children: 2  . Years of education: Not on file  . Highest education level: Not on file  Occupational History    Comment: retired  Tobacco Use  . Smoking status: Former Smoker    Packs/day: 2.00    Years: 60.00    Pack years: 120.00    Types: Cigarettes    Quit date: 04/2018    Years since quitting: 1.4  . Smokeless tobacco: Never Used  Vaping Use  . Vaping Use: Former  Substance and Sexual Activity  . Alcohol use: No    Comment: heavy drinker in past, none for 20 years  . Drug use: No  . Sexual activity: Not Currently  Other Topics Concern  . Not on file  Social History Narrative  . Not on file     Social Determinants of Health   Financial Resource Strain:   . Difficulty of Paying Living Expenses: Not on file  Food Insecurity:   . Worried About Charity fundraiser in the Last Year: Not on file  . Ran Out of Food in the Last Year: Not on file  Transportation Needs:   . Lack of Transportation (Medical): Not on file  . Lack of Transportation (Non-Medical): Not on file  Physical Activity:   . Days of Exercise per Week: Not on file  . Minutes of Exercise per Session: Not on file  Stress:   . Feeling of Stress : Not on file  Social Connections:   . Frequency of Communication with Friends and Family: Not on file  . Frequency of Social Gatherings with Friends and Family: Not on file  . Attends Religious Services: Not on file  . Active Member of Clubs or Organizations: Not on file  . Attends Archivist Meetings: Not on file  . Marital Status: Not on file     Family History: The patient's family history is not on file.  ROS:   Please see the history of present illness.     All other systems reviewed and are negative.  EKGs/Labs/Other Studies Reviewed:    The following studies were reviewed today:  Echo 05/25/2019 1. Left ventricular ejection fraction, by estimation, is 55 to 60%. The  left ventricle has normal function. The left ventricle has no regional  wall motion abnormalities. Left ventricular diastolic parameters are  consistent with Grade II diastolic  dysfunction (pseudonormalization). Elevated left atrial pressure.  2. Right ventricular systolic function is mildly reduced. The right  ventricular size is mildly enlarged. There is mildly elevated pulmonary  artery systolic pressure. The estimated right ventricular systolic  pressure is 37.1 mmHg.  3. The mitral valve is normal in structure. No evidence of mitral valve  regurgitation. No evidence of mitral stenosis.  4. The aortic valve is tricuspid. Aortic valve regurgitation is not  visualized. Mild  aortic valve sclerosis is present, with no evidence of  aortic valve stenosis.  5. The inferior vena cava is dilated in size with <50% respiratory  variability, suggesting right atrial pressure of 15 mmHg.    EKG:  EKG is not ordered today.    Recent Labs: 05/24/2019: ALT 21; B Natriuretic Peptide 1,733.6 05/31/2019: BUN 40; Creatinine, Ser 1.44; Hemoglobin 11.0; Platelets 164; Potassium 4.7; Sodium 132  Recent Lipid Panel    Component Value Date/Time   CHOL 81 05/26/2019 0839   TRIG 87 05/26/2019 0839   HDL 28 (L) 05/26/2019 0839   CHOLHDL  2.9 05/26/2019 0839   VLDL 17 05/26/2019 0839   LDLCALC 36 05/26/2019 0839    Physical Exam:    VS:  BP 130/70   Ht 5\' 9"  (1.753 m)   Wt 172 lb (78 kg)   BMI 25.40 kg/m     Wt Readings from Last 3 Encounters:  09/23/19 172 lb (78 kg)  09/09/19 171 lb 9.6 oz (77.8 kg)  06/11/19 167 lb 9.6 oz (76 kg)     GEN:  Well nourished, well developed in no acute distress HEENT: Normal NECK: No JVD; No carotid bruits LYMPHATICS: No lymphadenopathy CARDIAC: RRR, no murmurs, rubs, gallops RESPIRATORY:  Clear to auscultation without rales, wheezing or rhonchi  ABDOMEN: Soft, non-tender, non-distended MUSCULOSKELETAL:  No edema; No deformity  SKIN: Warm and dry NEUROLOGIC:  Alert and oriented x 3 PSYCHIATRIC:  Normal affect   ASSESSMENT:    1. Chronic diastolic heart failure ([place])   2. Chronic obstructive pulmonary disease, unspecified COPD type ([place])   3. Bilateral carotid artery stenosis   4. Controlled type 2 diabetes mellitus without complication, without long-term current use of insulin (HCC)   5. Stage 3 chronic kidney disease, unspecified whether stage 3a or 3b CKD    PLAN:    In order of problems listed above:  1. Chronic diastolic heart failure: Appears to be euvolemic on physical exam.  Only trace amount of edema in the ankle area.  We will continue on the current dose of diuretic.  2. COPD: Home O2 has reduced from the  previous 4 L/min down to 3 L/min  3. Coronary artery disease: Known history of carotid artery disease, however patient is in poor health and likely is not a surgical candidate.  4. DM2: Managed by primary care provider  5. CKD stage III: Stable on last lab work   Medication Adjustments/Labs and Tests Ordered: Current medicines are reviewed at length with the patient today.  Concerns regarding medicines are outlined above.  No orders of the defined types were placed in this encounter.  No orders of the defined types were placed in this encounter.   Patient Instructions  Medication Instructions:  Your physician recommends that you continue on your current medications as directed. Please refer to the Current Medication list given to you today.  *If you need a refill on your cardiac medications before your next appointment, please call your pharmacy*  Lab Work: NONE ordered at this time of appointment   If you have labs (blood work) drawn today and your tests are completely normal, you will receive your results only by: Marland Kitchen MyChart Message (if you have MyChart) OR . A paper copy in the mail If you have any lab test that is abnormal or we need to change your treatment, we will call you to review the results.  Testing/Procedures: NONE ordered at this time of appointment   Follow-Up: At [facility], you and your health needs are our priority.  As part of our continuing mission to provide you with exceptional heart care, we have created designated Provider Care Teams.  These Care Teams include your primary Cardiologist (physician) and Advanced Practice Providers (APPs -  Physician Assistants and Nurse Practitioners) who all work together to provide you with the care you need, when you need it.  We recommend signing up for the patient portal called "MyChart".  Sign up information is provided on this After Visit Summary.  MyChart is used to connect with patients for Virtual Visits  (Telemedicine).  Patients  are able to view lab/test results, encounter notes, upcoming appointments, etc.  Non-urgent messages can be sent to your provider as well.   To learn more about what you can do with MyChart, go to [email or web address].    Your next appointment:   2-3 month(s)  The format for your next appointment:   In Person  Provider:   Minus Breeding, MD       Signed, Adrian Andrews, [place]  09/25/2019 11:38 PM    [place]

## 2019-09-23 NOTE — Patient Instructions (Signed)
Medication Instructions:  Your physician recommends that you continue on your current medications as directed. Please refer to the Current Medication list given to you today.  *If you need a refill on your cardiac medications before your next appointment, please call your pharmacy*  Lab Work: NONE ordered at this time of appointment   If you have labs (blood work) drawn today and your tests are completely normal, you will receive your results only by: Marland Kitchen MyChart Message (if you have MyChart) OR . A paper copy in the mail If you have any lab test that is abnormal or we need to change your treatment, we will call you to review the results.  Testing/Procedures: NONE ordered at this time of appointment   Follow-Up: At [facility], you and your health needs are our priority.  As part of our continuing mission to provide you with exceptional heart care, we have created designated Provider Care Teams.  These Care Teams include your primary Cardiologist (physician) and Advanced Practice Providers (APPs -  Physician Assistants and Nurse Practitioners) who all work together to provide you with the care you need, when you need it.  We recommend signing up for the patient portal called "MyChart".  Sign up information is provided on this After Visit Summary.  MyChart is used to connect with patients for Virtual Visits (Telemedicine).  Patients are able to view lab/test results, encounter notes, upcoming appointments, etc.  Non-urgent messages can be sent to your provider as well.   To learn more about what you can do with MyChart, go to [email or web address].    Your next appointment:   2-3 month(s)  The format for your next appointment:   In Person  Provider:   Minus Breeding, MD

## 2019-09-27 DIAGNOSIS — J449 Chronic obstructive pulmonary disease, unspecified: Secondary | ICD-10-CM | POA: Diagnosis not present

## 2019-09-27 DIAGNOSIS — I2510 Atherosclerotic heart disease of native coronary artery without angina pectoris: Secondary | ICD-10-CM | POA: Diagnosis not present

## 2019-09-27 DIAGNOSIS — F064 Anxiety disorder due to known physiological condition: Secondary | ICD-10-CM | POA: Diagnosis not present

## 2019-09-27 DIAGNOSIS — J9610 Chronic respiratory failure, unspecified whether with hypoxia or hypercapnia: Secondary | ICD-10-CM | POA: Diagnosis not present

## 2019-09-27 DIAGNOSIS — I10 Essential (primary) hypertension: Secondary | ICD-10-CM | POA: Diagnosis not present

## 2019-10-07 DIAGNOSIS — Z23 Encounter for immunization: Secondary | ICD-10-CM | POA: Diagnosis not present

## 2019-10-19 DIAGNOSIS — Z20828 Contact with and (suspected) exposure to other viral communicable diseases: Secondary | ICD-10-CM | POA: Diagnosis not present

## 2019-10-25 ENCOUNTER — Telehealth: Payer: Self-pay | Admitting: Cardiology

## 2019-10-25 DIAGNOSIS — I2510 Atherosclerotic heart disease of native coronary artery without angina pectoris: Secondary | ICD-10-CM | POA: Diagnosis not present

## 2019-10-25 DIAGNOSIS — E119 Type 2 diabetes mellitus without complications: Secondary | ICD-10-CM | POA: Diagnosis not present

## 2019-10-25 DIAGNOSIS — I10 Essential (primary) hypertension: Secondary | ICD-10-CM | POA: Diagnosis not present

## 2019-10-25 DIAGNOSIS — I5032 Chronic diastolic (congestive) heart failure: Secondary | ICD-10-CM | POA: Diagnosis not present

## 2019-10-25 DIAGNOSIS — J449 Chronic obstructive pulmonary disease, unspecified: Secondary | ICD-10-CM | POA: Diagnosis not present

## 2019-10-25 DIAGNOSIS — J9610 Chronic respiratory failure, unspecified whether with hypoxia or hypercapnia: Secondary | ICD-10-CM | POA: Diagnosis not present

## 2019-10-25 NOTE — Telephone Encounter (Signed)
Spoke with [place] at [facility] and patient has been taking Atenolol 50 mg twice a day along with Metoprolol 25 mg  1 and 1/2 tablets twice a day No blood pressure/HR readings available however she stated he wears pulse ox and HR ok Atenolol was d/c and Metoprolol was started when patient was discharged from hospital in May  Will forward to Dr [place] DOD so doses can be reviewed and determination on which medication to be on

## 2019-10-25 NOTE — Telephone Encounter (Signed)
I would continue metoprolol - same dose but stop atenolol.   Collier Salina

## 2019-10-25 NOTE — Telephone Encounter (Signed)
Left message to call back  

## 2019-10-25 NOTE — Telephone Encounter (Signed)
Pt c/o medication issue:  1. Name of Medication: atenolol (TENORMIN) 50 MG tablet / metoprolol tartrate (LOPRESSOR) 25 MG tablet  2. How are you currently taking this medication (dosage and times per day)? As directed  3. Are you having a reaction (difficulty breathing--STAT)? no  4. What is your medication issue? [place] from [facility] is calling to make sure that the patient is not taking two beta blockers. And if he is, why? Please advise.

## 2019-10-25 NOTE — Telephone Encounter (Signed)
[place], verbalized understanding

## 2019-10-27 DIAGNOSIS — I10 Essential (primary) hypertension: Secondary | ICD-10-CM | POA: Diagnosis not present

## 2019-10-27 DIAGNOSIS — E119 Type 2 diabetes mellitus without complications: Secondary | ICD-10-CM | POA: Diagnosis not present

## 2019-10-28 DIAGNOSIS — Z20828 Contact with and (suspected) exposure to other viral communicable diseases: Secondary | ICD-10-CM | POA: Diagnosis not present

## 2019-11-12 DIAGNOSIS — C44612 Basal cell carcinoma of skin of right upper limb, including shoulder: Secondary | ICD-10-CM | POA: Diagnosis not present

## 2019-11-12 DIAGNOSIS — L820 Inflamed seborrheic keratosis: Secondary | ICD-10-CM | POA: Diagnosis not present

## 2019-11-12 DIAGNOSIS — D485 Neoplasm of uncertain behavior of skin: Secondary | ICD-10-CM | POA: Diagnosis not present

## 2019-11-22 NOTE — Progress Notes (Signed)
Histology and Location of Primary Skin Cancer: Right Nasal Sidewall- Basal Cell, 64mm   Past/Anticipated interventions by patient's surgeon/dermatologist for current problematic lesion, if any:  [facility] Ms Murphy/ Dr. Renda Rolls 09/16/2019 -Patient declined MOH's surgery to right nasal sidewall due to oxygen dependency. -Right scapula in the tattoo 1.2 cm pink scaly plaque- NUB- recommend Bx to rule out BCC vs SCC.    Past skin cancers, if any:  1) Location/Histology/Intervention: Right Superior temple region-basal cell carcinoma with sclerosis/ MOH's    2) Location/Histology/Intervention: Right Scapula- Will have surgical removal in December 2021.  3) Location/Histology/Intervention:   History of Blistering sunburns, if any: No  SAFETY ISSUES:  Prior radiation? No  Pacemaker/ICD? No  Possible current pregnancy? n/a  Is the patient on methotrexate? No  Current Complaints / other details:   -Oxygen- 4L nasal cannula -Resides at [place]: [phone]

## 2019-11-23 ENCOUNTER — Ambulatory Visit
Admission: RE | Admit: 2019-11-23 | Discharge: 2019-11-23 | Disposition: A | Discharge status: Home / Self Care | Payer: Medicare Other | Source: Ambulatory Visit | Attending: Radiation Oncology | Admitting: Radiation Oncology

## 2019-11-23 ENCOUNTER — Encounter: Payer: Self-pay | Admitting: Radiation Oncology

## 2019-11-23 ENCOUNTER — Other Ambulatory Visit: Payer: Self-pay

## 2019-11-23 VITALS — BP 101/60 | HR 73 | Temp 97.5°F | Resp 14 | Ht 69.0 in | Wt 171.5 lb

## 2019-11-23 DIAGNOSIS — E119 Type 2 diabetes mellitus without complications: Secondary | ICD-10-CM | POA: Diagnosis not present

## 2019-11-23 DIAGNOSIS — Z87891 Personal history of nicotine dependence: Secondary | ICD-10-CM | POA: Insufficient documentation

## 2019-11-23 DIAGNOSIS — N183 Chronic kidney disease, stage 3 unspecified: Secondary | ICD-10-CM | POA: Insufficient documentation

## 2019-11-23 DIAGNOSIS — E785 Hyperlipidemia, unspecified: Secondary | ICD-10-CM | POA: Diagnosis not present

## 2019-11-23 DIAGNOSIS — I130 Hypertensive heart and chronic kidney disease with heart failure and stage 1 through stage 4 chronic kidney disease, or unspecified chronic kidney disease: Secondary | ICD-10-CM | POA: Diagnosis not present

## 2019-11-23 DIAGNOSIS — Z8673 Personal history of transient ischemic attack (TIA), and cerebral infarction without residual deficits: Secondary | ICD-10-CM | POA: Diagnosis not present

## 2019-11-23 DIAGNOSIS — F40240 Claustrophobia: Secondary | ICD-10-CM | POA: Insufficient documentation

## 2019-11-23 DIAGNOSIS — Z7982 Long term (current) use of aspirin: Secondary | ICD-10-CM | POA: Diagnosis not present

## 2019-11-23 DIAGNOSIS — M129 Arthropathy, unspecified: Secondary | ICD-10-CM | POA: Diagnosis not present

## 2019-11-23 DIAGNOSIS — Z7984 Long term (current) use of oral hypoglycemic drugs: Secondary | ICD-10-CM | POA: Insufficient documentation

## 2019-11-23 DIAGNOSIS — L989 Disorder of the skin and subcutaneous tissue, unspecified: Secondary | ICD-10-CM | POA: Diagnosis not present

## 2019-11-23 DIAGNOSIS — J449 Chronic obstructive pulmonary disease, unspecified: Secondary | ICD-10-CM | POA: Insufficient documentation

## 2019-11-23 DIAGNOSIS — C44311 Basal cell carcinoma of skin of nose: Secondary | ICD-10-CM

## 2019-11-23 DIAGNOSIS — Z79899 Other long term (current) drug therapy: Secondary | ICD-10-CM | POA: Diagnosis not present

## 2019-11-23 DIAGNOSIS — Z9889 Other specified postprocedural states: Secondary | ICD-10-CM | POA: Diagnosis not present

## 2019-11-23 DIAGNOSIS — C44519 Basal cell carcinoma of skin of other part of trunk: Secondary | ICD-10-CM | POA: Diagnosis not present

## 2019-11-23 MED ORDER — LORAZEPAM 0.5 MG PO TABS: ORAL_TABLET | ORAL | 0 refills | Status: DC

## 2019-11-23 NOTE — Progress Notes (Signed)
Radiation Oncology         ([phone]) [phone] ________________________________  Name: Adrian Andrews        MRN: [id number]  Date of Service: 11/23/2019 DOB: [birth year]  CC:[name], MD  [place], [place] PHYSICIAN: [place], Christina L*   DIAGNOSIS: The encounter diagnosis was Basal cell carcinoma (BCC) of nasal sidewall.   HISTORY OF PRESENT ILLNESS: Adrian Andrews is a 69 y.o. male seen at the request of Harless Litten, [facility] /Dr. Renda Rolls in dermatology for a basal cell carcinoma along the right nasal sidewall. This was biopsied in the office on 01/03/19.  The patient has declined Mohs procedure due to the fact that he requires supplemental oxygen due to COPD and acute diastolic heart failure.  He was offered left heart catheterization but has been managed medically and relies on 4L of oxygen constantly.  He is seen today to discuss options of radiotherapy. He has a lesion in his torso along his scapula that is pending evaluation as well for a nodular basal cell carcinoma per discussion with Coletta Memos, PAC.     PREVIOUS RADIATION THERAPY: No   PAST MEDICAL HISTORY:  Past Medical History:  Diagnosis Date  . Anxiety   . Arthritis    back, right hip  . Chronic kidney disease    stage 3  . COPD (chronic obstructive pulmonary disease) ([place])    on 3L home O2  . Diabetes mellitus without complication ([place])   . Dyspnea   . Hyperlipidemia   . Hypertension   . Peripheral vascular disease ([place])    Carotid artery stenosis, s/p bilat stents  . Skin cancer    basal cell right cheek, left ear  . Stroke [facility])    noted on MRI       PAST SURGICAL HISTORY: Past Surgical History:  Procedure Laterality Date  . ENDARTERECTOMY Left 02/21/2017   Procedure: LEFT CAROTID ENDARTERECTOMY;  Surgeon: Rosetta Posner, MD;  Location: [place] OR;  Service: Vascular;  Laterality: Left;  . lower extremity stenting    . PATCH ANGIOPLASTY Left 02/21/2017   Procedure: PATCH  ANGIOPLASTY OF LEFT CAROTID ARTERY USING HEMASHIELD PLATINUM FINESSE PATCH;  Surgeon: Rosetta Posner, MD;  Location: [place];  Service: Vascular;  Laterality: Left;     FAMILY HISTORY: History reviewed. No pertinent family history.   SOCIAL HISTORY:  reports that he quit smoking about 19 months ago. His smoking use included cigarettes. He has a 120.00 pack-year smoking history. He has never used smokeless tobacco. He reports that he does not drink alcohol and does not use drugs.   ALLERGIES: Clindamycin/lincomycin, Contrast media [iodinated diagnostic agents], and Metformin and related   MEDICATIONS:  Current Outpatient Medications  Medication Sig Dispense Refill  . acetaminophen (TYLENOL) 325 MG tablet Take 2 tablets (650 mg total) by mouth every 6 (six) hours as needed for mild pain or headache. 30 tablet 0  . albuterol (VENTOLIN HFA) 108 (90 Base) MCG/ACT inhaler Inhale 2 puffs into the lungs every 6 (six) hours as needed for wheezing or shortness of breath. 8 g 11  . aspirin EC 81 MG tablet Take 81 mg by mouth daily.    Marland Kitchen atorvastatin (LIPITOR) 40 MG tablet Take 20 mg by mouth at bedtime.     . budesonide-formoterol (SYMBICORT) 80-4.5 MCG/ACT inhaler Inhale 1 puff into the lungs 2 (two) times daily.    . calcium carbonate (TUMS EX) 750 MG chewable tablet Chew 1 tablet by  mouth daily as needed (For indegestion or nausea).    . clopidogrel (PLAVIX) 75 MG tablet Take 1 tablet (75 mg total) by mouth daily. 30 tablet 0  . dapagliflozin propanediol (FARXIGA) 5 MG TABS tablet Take 5 mg by mouth daily before breakfast. 30 tablet 0  . fluticasone (FLONASE) 50 MCG/ACT nasal spray Place 2 sprays into both nostrils daily. 9.9 mL 0  . furosemide (LASIX) 20 MG tablet Take 2 tablets (40 mg total) by mouth daily. 30 tablet 0  . gabapentin (NEURONTIN) 300 MG capsule Take 1 capsule (300 mg total) by mouth every morning. 30 capsule 0  . glipiZIDE (GLUCOTROL) 10 MG tablet Take 1 tablet (10 mg total) by mouth  2 (two) times daily before a meal. 60 tablet 3  . Glycopyrrolate-Formoterol (BEVESPI AEROSPHERE) 9-4.8 MCG/ACT AERO Inhale 2 puffs into the lungs 2 (two) times daily. 19.2 g 0  . guaiFENesin (MUCINEX) 600 MG 12 hr tablet Take 1 tablet (600 mg total) by mouth 2 (two) times daily. 30 tablet 0  . HYDROcodone-acetaminophen (NORCO/VICODIN) 5-325 MG tablet Take 1 tablet by mouth 2 (two) times daily as needed for moderate pain.    Marland Kitchen loperamide (IMODIUM) 2 MG capsule Take 2 mg by mouth as needed for diarrhea or loose stools.    . metoprolol tartrate (LOPRESSOR) 25 MG tablet Take 1.5 tablets (37.5 mg total) by mouth 2 (two) times daily. 60 tablet 0  . pantoprazole (PROTONIX) 40 MG tablet Take 1 tablet (40 mg total) by mouth 2 (two) times daily. 60 tablet 0  . pioglitazone (ACTOS) 30 MG tablet Take 30 mg by mouth daily.    . polyethylene glycol (MIRALAX / GLYCOLAX) 17 g packet Take 17 g by mouth daily as needed for moderate constipation. 14 each 0  . sertraline (ZOLOFT) 100 MG tablet Take 100 mg by mouth daily.    . tamsulosin (FLOMAX) 0.4 MG CAPS capsule Take 0.4 mg by mouth at bedtime.    . traMADol (ULTRAM) 50 MG tablet Take 50 mg by mouth 3 (three) times daily as needed.    . traZODone (DESYREL) 50 MG tablet Take 50 mg by mouth at bedtime.    . furosemide (LASIX) 20 MG tablet Take 1 tablet (20 mg total) by mouth daily as needed (for greater than 2lbs weight gain). 90 tablet 3  . LORazepam (ATIVAN) 0.5 MG tablet 1 tab po 30 minutes prior to radiation to avoid claustrophobia 30 tablet 0   No current facility-administered medications for this encounter.     REVIEW OF SYSTEMS: On review of systems, the patient reports that he is doing well overall. He reports he is not short of breath during rest, and continues on 4L O2 [place]. He reports crusting and bleeding of the lesion on the right nasal dorsum but also notes this in the midline of the nasal bridge. He has shoulder pain that is chronic. No other  complaints ar enoted.     PHYSICAL EXAM:  Wt Readings from Last 3 Encounters:  11/23/19 171 lb 8 oz (77.8 kg)  09/23/19 172 lb (78 kg)  09/09/19 171 lb 9.6 oz (77.8 kg)   Temp Readings from Last 3 Encounters:  11/23/19 (!) 97.5 F (36.4 C) (Temporal)  06/11/19 (!) 97 F (36.1 C)  05/31/19 97.9 F (36.6 C) (Oral)   BP Readings from Last 3 Encounters:  11/23/19 101/60  09/23/19 130/70  09/09/19 (!) 160/90   Pulse Readings from Last 3 Encounters:  11/23/19 73  09/09/19 66  06/11/19 (!) 59   Pain Assessment Pain Score: 5  Pain Loc: Shoulder (Right)/10  In general this is a well appearing caucasian male in no acute distress. He is wearing O2 via nasal cannula. He does have a small 5 mm crusted lesion along the right nasal bridge, and also a 3-4 mm crusted lesion along the midline of the nasal bridge there is also prominent capillaries of the skin of the nose. He has postoperative changes of his left pinna with volume reduction from a prior surgical resection.  He's alert and oriented x4 and appropriate throughout the examination. Cardiopulmonary assessment is negative for acute distress and he exhibits normal effort.     ECOG = 1  0 - Asymptomatic (Fully active, able to carry on all predisease activities without restriction)  1 - Symptomatic but completely ambulatory (Restricted in physically strenuous activity but ambulatory and able to carry out work of a light or sedentary nature. For example, light housework, office work)  2 - Symptomatic, <50% in bed during the day (Ambulatory and capable of all self care but unable to carry out any work activities. Up and about more than 50% of waking hours)  3 - Symptomatic, >50% in bed, but not bedbound (Capable of only limited self-care, confined to bed or chair 50% or more of waking hours)  4 - Bedbound (Completely disabled. Cannot carry on any self-care. Totally confined to bed or chair)  5 - Death   Eustace Pen MM, Creech RH, Tormey  DC, et al. [phone]). "Toxicity and response criteria of the [facility] Group". [place] Oncol. 5 (6): 649-55    LABORATORY DATA:  Lab Results  Component Value Date   WBC 5.9 05/31/2019   HGB 11.0 (L) 05/31/2019   HCT 32.9 (L) 05/31/2019   MCV 100.6 (H) 05/31/2019   PLT 164 05/31/2019   Lab Results  Component Value Date   NA 132 (L) 05/31/2019   K 4.7 05/31/2019   CL 96 (L) 05/31/2019   CO2 29 05/31/2019   Lab Results  Component Value Date   ALT 21 05/24/2019   AST 26 05/24/2019   ALKPHOS 67 05/24/2019   BILITOT 1.1 05/24/2019      RADIOGRAPHY: No results found.     IMPRESSION/PLAN: 1. Basal Cell Carcinoma of the right nasal bridge. Dr. Lisbeth Renshaw discusses the pathology findings and reviews the nature of skin carcinomas and that while mohs' surgery would be standard of care, in the location of this lesion, healing could be difficult, and the patient's comorbidities requiring anticoagulation and all these issues considered, that radiotherapy would be an alternative to mohs' surgery. We discussed the risks, benefits, short, and long term effects of radiotherapy, and the patient is interested in proceeding. Dr. Lisbeth Renshaw discusses the delivery and logistics of radiotherapy and anticipates a course of 4 weeks of radiotherapy. Written consent is obtained and placed in the chart, a copy was provided to the patient. He will simulate next Friday at 2pm. We will follow up with his referring team to also ask about the lesion in the nasal bridge more medially. 2. Possible second lesion of the medial nasal bridge. I'll reach out to the patient's referring providers to find out if the midline lesion has been biopsied prior to proceeding with treatment for #1. This would allow for further clarity on whether this lesion needs to be included in the treatment field. 3. Scapular basal cell carcinoma. The patient will meet [facility] to discuss  possible mohs'  procedure. 4. Claustrophobia. We discussed the need to make an immobilizing mask for simulation and subsequent treatment. The patient is very claustrophobic and we discussed using ativan po 30 minutes prior to coming for simulation or treatments. We reviewed the side effect profile of this and sent in a prescription.  In a visit lasting 60 minutes, greater than 50% of the time was spent face to face discussing the patient's condition, in preparation for the discussion, and coordinating the patient's care.  The above documentation reflects my direct findings during this shared patient visit. Please see the separate note by Dr. Lisbeth Renshaw on this date for the remainder of the patient's plan of care.    Carola Rhine, PAC  Addendum: I spoke with Amy Raquel Sarna) Percell Miller, [facility] and she has not biopsied the medial lesion of the nasal bridge, and will ask the patient to come back in for re-evaluation and likely biopsy. She is aware of simulation time and we can move this if necessary, but she plans to see him soon to decide.

## 2019-11-29 ENCOUNTER — Telehealth: Payer: Self-pay | Admitting: *Deleted

## 2019-11-29 NOTE — Telephone Encounter (Signed)
Spoke with the patient to update him on some changes made to his appointments.  His simulation appointment has been moved to December 1 at 1:30 pm.  He verbalized understanding of this and would like for me to reach out to [facility] to make them aware as well.  Spoke with Varney Biles at [facility] living and made her aware of his upcoming appointments.  Gloriajean Dell. Leonie Green, BSN

## 2019-11-30 ENCOUNTER — Ambulatory Visit: Payer: Medicare Other | Admitting: Cardiology

## 2019-12-02 DIAGNOSIS — D485 Neoplasm of uncertain behavior of skin: Secondary | ICD-10-CM | POA: Diagnosis not present

## 2019-12-02 DIAGNOSIS — C44311 Basal cell carcinoma of skin of nose: Secondary | ICD-10-CM | POA: Diagnosis not present

## 2019-12-03 ENCOUNTER — Ambulatory Visit: Payer: Medicare Other | Admitting: Radiation Oncology

## 2019-12-13 DIAGNOSIS — J449 Chronic obstructive pulmonary disease, unspecified: Secondary | ICD-10-CM | POA: Diagnosis not present

## 2019-12-13 DIAGNOSIS — M5489 Other dorsalgia: Secondary | ICD-10-CM | POA: Diagnosis not present

## 2019-12-13 DIAGNOSIS — E119 Type 2 diabetes mellitus without complications: Secondary | ICD-10-CM | POA: Diagnosis not present

## 2019-12-13 DIAGNOSIS — J9610 Chronic respiratory failure, unspecified whether with hypoxia or hypercapnia: Secondary | ICD-10-CM | POA: Diagnosis not present

## 2019-12-14 DIAGNOSIS — M545 Low back pain, unspecified: Secondary | ICD-10-CM | POA: Diagnosis not present

## 2019-12-15 ENCOUNTER — Other Ambulatory Visit: Payer: Self-pay

## 2019-12-15 ENCOUNTER — Ambulatory Visit
Admission: RE | Admit: 2019-12-15 | Discharge: 2019-12-15 | Disposition: A | Discharge status: Home / Self Care | Payer: Medicare Other | Source: Ambulatory Visit | Attending: Radiation Oncology | Admitting: Radiation Oncology

## 2019-12-15 DIAGNOSIS — C44311 Basal cell carcinoma of skin of nose: Secondary | ICD-10-CM | POA: Diagnosis present

## 2019-12-15 DIAGNOSIS — Z510 Encounter for antineoplastic radiation therapy: Secondary | ICD-10-CM | POA: Insufficient documentation

## 2019-12-17 DIAGNOSIS — C44519 Basal cell carcinoma of skin of other part of trunk: Secondary | ICD-10-CM | POA: Diagnosis not present

## 2019-12-21 ENCOUNTER — Ambulatory Visit: Payer: Medicare Other | Admitting: Radiation Oncology

## 2019-12-21 DIAGNOSIS — C44311 Basal cell carcinoma of skin of nose: Secondary | ICD-10-CM | POA: Diagnosis not present

## 2019-12-21 DIAGNOSIS — Z510 Encounter for antineoplastic radiation therapy: Secondary | ICD-10-CM | POA: Diagnosis not present

## 2019-12-22 ENCOUNTER — Other Ambulatory Visit: Payer: Self-pay

## 2019-12-22 ENCOUNTER — Ambulatory Visit
Admission: RE | Admit: 2019-12-22 | Discharge: 2019-12-22 | Disposition: A | Discharge status: Home / Self Care | Payer: Medicare Other | Source: Ambulatory Visit | Attending: Radiation Oncology | Admitting: Radiation Oncology

## 2019-12-22 DIAGNOSIS — Z510 Encounter for antineoplastic radiation therapy: Secondary | ICD-10-CM | POA: Diagnosis not present

## 2019-12-22 DIAGNOSIS — C44311 Basal cell carcinoma of skin of nose: Secondary | ICD-10-CM | POA: Diagnosis not present

## 2019-12-23 ENCOUNTER — Ambulatory Visit: Payer: Medicare Other

## 2019-12-24 ENCOUNTER — Other Ambulatory Visit: Payer: Self-pay

## 2019-12-24 ENCOUNTER — Ambulatory Visit
Admission: RE | Admit: 2019-12-24 | Discharge: 2019-12-24 | Disposition: A | Discharge status: Home / Self Care | Payer: Medicare Other | Source: Ambulatory Visit | Attending: Radiation Oncology | Admitting: Radiation Oncology

## 2019-12-24 DIAGNOSIS — C44311 Basal cell carcinoma of skin of nose: Secondary | ICD-10-CM | POA: Diagnosis not present

## 2019-12-24 DIAGNOSIS — Z510 Encounter for antineoplastic radiation therapy: Secondary | ICD-10-CM | POA: Diagnosis not present

## 2019-12-27 ENCOUNTER — Ambulatory Visit
Admission: RE | Admit: 2019-12-27 | Discharge: 2019-12-27 | Disposition: A | Discharge status: Home / Self Care | Payer: Medicare Other | Source: Ambulatory Visit | Attending: Radiation Oncology | Admitting: Radiation Oncology

## 2019-12-27 DIAGNOSIS — Z510 Encounter for antineoplastic radiation therapy: Secondary | ICD-10-CM | POA: Diagnosis not present

## 2019-12-27 DIAGNOSIS — C44311 Basal cell carcinoma of skin of nose: Secondary | ICD-10-CM | POA: Diagnosis not present

## 2019-12-28 ENCOUNTER — Ambulatory Visit: Payer: Medicare Other

## 2019-12-29 ENCOUNTER — Other Ambulatory Visit: Payer: Self-pay

## 2019-12-29 ENCOUNTER — Ambulatory Visit
Admission: RE | Admit: 2019-12-29 | Discharge: 2019-12-29 | Disposition: A | Discharge status: Home / Self Care | Payer: Medicare Other | Source: Ambulatory Visit | Attending: Radiation Oncology | Admitting: Radiation Oncology

## 2019-12-29 DIAGNOSIS — C44311 Basal cell carcinoma of skin of nose: Secondary | ICD-10-CM | POA: Diagnosis not present

## 2019-12-29 DIAGNOSIS — Z510 Encounter for antineoplastic radiation therapy: Secondary | ICD-10-CM | POA: Diagnosis not present

## 2019-12-30 ENCOUNTER — Ambulatory Visit: Payer: Medicare Other

## 2019-12-31 ENCOUNTER — Ambulatory Visit
Admission: RE | Admit: 2019-12-31 | Discharge: 2019-12-31 | Disposition: A | Discharge status: Home / Self Care | Payer: Medicare Other | Source: Ambulatory Visit | Attending: Radiation Oncology | Admitting: Radiation Oncology

## 2019-12-31 DIAGNOSIS — Z510 Encounter for antineoplastic radiation therapy: Secondary | ICD-10-CM | POA: Diagnosis not present

## 2019-12-31 DIAGNOSIS — C44311 Basal cell carcinoma of skin of nose: Secondary | ICD-10-CM | POA: Diagnosis not present

## 2020-01-03 ENCOUNTER — Ambulatory Visit: Payer: Medicare Other

## 2020-01-03 DIAGNOSIS — Z23 Encounter for immunization: Secondary | ICD-10-CM | POA: Diagnosis not present

## 2020-01-04 ENCOUNTER — Ambulatory Visit: Payer: Medicare Other

## 2020-01-05 ENCOUNTER — Ambulatory Visit: Payer: Medicare Other

## 2020-01-06 ENCOUNTER — Ambulatory Visit: Payer: Medicare Other

## 2020-01-09 ENCOUNTER — Ambulatory Visit: Payer: Medicare Other

## 2020-01-10 ENCOUNTER — Ambulatory Visit
Admission: RE | Admit: 2020-01-10 | Discharge: 2020-01-10 | Disposition: A | Discharge status: Home / Self Care | Payer: Medicare Other | Source: Ambulatory Visit | Attending: Radiation Oncology | Admitting: Radiation Oncology

## 2020-01-10 ENCOUNTER — Ambulatory Visit: Payer: Medicare Other

## 2020-01-10 DIAGNOSIS — C44311 Basal cell carcinoma of skin of nose: Secondary | ICD-10-CM | POA: Diagnosis not present

## 2020-01-10 DIAGNOSIS — Z510 Encounter for antineoplastic radiation therapy: Secondary | ICD-10-CM | POA: Diagnosis not present

## 2020-01-11 ENCOUNTER — Ambulatory Visit: Payer: Medicare Other

## 2020-01-12 ENCOUNTER — Ambulatory Visit: Payer: Medicare Other

## 2020-01-12 ENCOUNTER — Ambulatory Visit
Admission: RE | Admit: 2020-01-12 | Discharge: 2020-01-12 | Disposition: A | Discharge status: Home / Self Care | Payer: Medicare Other | Source: Ambulatory Visit | Attending: Radiation Oncology | Admitting: Radiation Oncology

## 2020-01-12 DIAGNOSIS — C44311 Basal cell carcinoma of skin of nose: Secondary | ICD-10-CM | POA: Diagnosis not present

## 2020-01-12 DIAGNOSIS — Z510 Encounter for antineoplastic radiation therapy: Secondary | ICD-10-CM | POA: Diagnosis not present

## 2020-01-13 ENCOUNTER — Ambulatory Visit: Payer: Medicare Other

## 2020-01-15 ENCOUNTER — Ambulatory Visit: Payer: Medicare Other

## 2020-01-16 NOTE — Progress Notes (Signed)
Cardiology Office Note   Date:  01/18/2020   ID:  COYT GOVONI, DOB 03/08/1950, MRN [id number]  PCP:  No primary care provider on file.  Cardiologist:   Rollene Rotunda, MD  Chief Complaint  Patient presents with  . Cough      History of Present Illness: Adrian Andrews is a 70 y.o. male who presents for follow up post hospitalization for acute respiratory failure.   He had COPD flair and acute diastoic HF.   He declined left heart cath and was managed medically.   He was sent home on 3 liters.  He had AKI with hyperkalemia.  He was hyponatremic.  Echo on 5/11 demonstrated an EF of 55 - 60%.    Since I last saw him he has had no acute cardiovascular complaints.  He brings record of his weights and they have been very steady.  He is on 3 or 4 L of oxygen routinely.  He has a long history of COPD.  He has had no increased ankle swelling.  He maintains a reasonable fluid intake by his report and avoids salt.  He does have as needed Lasix ordered but has not had to have this.  He sleeps chronically on 3 pillows.  He has had a cough productive of some clear sputum recently but has had no fevers or chills.  He has not had any new chest pressure, neck or arm discomfort.  He has had no new palpitations, presyncope or syncope.   Past Medical History:  Diagnosis Date  . Anxiety   . Arthritis    back, right hip  . Chronic kidney disease    stage 3  . COPD (chronic obstructive pulmonary disease) (HCC)    on 3L home O2  . Diabetes mellitus without complication ([facility])   . Dyspnea   . Hyperlipidemia   . Hypertension   . Peripheral vascular disease ([facility])    Carotid artery stenosis, s/p bilat stents  . Skin cancer    basal cell right cheek, left ear  . Stroke [facility])    noted on MRI    Past Surgical History:  Procedure Laterality Date  . ENDARTERECTOMY Left 02/21/2017   Procedure: LEFT CAROTID ENDARTERECTOMY;  Surgeon: Larina Earthly, MD;  Location: [facility] OR;  Service: Vascular;  Laterality:  Left;  . lower extremity stenting    . PATCH ANGIOPLASTY Left 02/21/2017   Procedure: PATCH ANGIOPLASTY OF LEFT CAROTID ARTERY USING HEMASHIELD PLATINUM FINESSE PATCH;  Surgeon: Larina Earthly, MD;  Location: [facility] OR;  Service: Vascular;  Laterality: Left;     Current Outpatient Medications  Medication Sig Dispense Refill  . acetaminophen (TYLENOL) 325 MG tablet Take 2 tablets (650 mg total) by mouth every 6 (six) hours as needed for mild pain or headache. 30 tablet 0  . albuterol (VENTOLIN HFA) 108 (90 Base) MCG/ACT inhaler Inhale 2 puffs into the lungs every 6 (six) hours as needed for wheezing or shortness of breath. 8 g 11  . aspirin EC 81 MG tablet Take 81 mg by mouth daily.    Marland Kitchen atorvastatin (LIPITOR) 40 MG tablet Take 20 mg by mouth at bedtime.     . budesonide-formoterol (SYMBICORT) 80-4.5 MCG/ACT inhaler Inhale 1 puff into the lungs 2 (two) times daily.    . calcium carbonate (TUMS EX) 750 MG chewable tablet Chew 1 tablet by mouth daily as needed (For indegestion or nausea).    . clopidogrel (PLAVIX) 75 MG tablet Take 1 tablet (  75 mg total) by mouth daily. 30 tablet 0  . dapagliflozin propanediol (FARXIGA) 5 MG TABS tablet Take 5 mg by mouth daily before breakfast. 30 tablet 0  . fluticasone (FLONASE) 50 MCG/ACT nasal spray Place 2 sprays into both nostrils daily. 9.9 mL 0  . furosemide (LASIX) 20 MG tablet Take 2 tablets (40 mg total) by mouth daily. 30 tablet 0  . gabapentin (NEURONTIN) 300 MG capsule Take 1 capsule (300 mg total) by mouth every morning. 30 capsule 0  . glipiZIDE (GLUCOTROL) 10 MG tablet Take 1 tablet (10 mg total) by mouth 2 (two) times daily before a meal. 60 tablet 3  . Glycopyrrolate-Formoterol (BEVESPI AEROSPHERE) 9-4.8 MCG/ACT AERO Inhale 2 puffs into the lungs 2 (two) times daily. 19.2 g 0  . guaiFENesin (MUCINEX) 600 MG 12 hr tablet Take 1 tablet (600 mg total) by mouth 2 (two) times daily. 30 tablet 0  . HYDROcodone-acetaminophen (NORCO/VICODIN) 5-325 MG tablet  Take 1 tablet by mouth 2 (two) times daily as needed for moderate pain.    Marland Kitchen loperamide (IMODIUM) 2 MG capsule Take 2 mg by mouth as needed for diarrhea or loose stools.    Marland Kitchen LORazepam (ATIVAN) 0.5 MG tablet 1 tab po 30 minutes prior to radiation to avoid claustrophobia 30 tablet 0  . metoprolol tartrate (LOPRESSOR) 25 MG tablet Take 1.5 tablets (37.5 mg total) by mouth 2 (two) times daily. 60 tablet 0  . pantoprazole (PROTONIX) 40 MG tablet Take 1 tablet (40 mg total) by mouth 2 (two) times daily. 60 tablet 0  . pioglitazone (ACTOS) 30 MG tablet Take 30 mg by mouth daily.    . polyethylene glycol (MIRALAX / GLYCOLAX) 17 g packet Take 17 g by mouth daily as needed for moderate constipation. 14 each 0  . sertraline (ZOLOFT) 100 MG tablet Take 100 mg by mouth daily.    . tamsulosin (FLOMAX) 0.4 MG CAPS capsule Take 0.4 mg by mouth at bedtime.    . traMADol (ULTRAM) 50 MG tablet Take 50 mg by mouth 3 (three) times daily as needed.    . traZODone (DESYREL) 50 MG tablet Take 50 mg by mouth at bedtime.    . furosemide (LASIX) 20 MG tablet Take 1 tablet (20 mg total) by mouth daily as needed (for greater than 2lbs weight gain). 90 tablet 3   No current facility-administered medications for this visit.    Allergies:   Clindamycin/lincomycin, Contrast media [iodinated diagnostic agents], and Metformin and related    ROS:  Please see the history of present illness.   Otherwise, review of systems are positive for shoulder pain.   All other systems are reviewed and negative.    PHYSICAL EXAM: VS:  BP 104/70   Pulse 84   Ht 5\' 9"  (1.753 m)   Wt 171 lb (77.6 kg)   SpO2 96%   BMI 25.25 kg/m  , BMI Body mass index is 25.25 kg/m. GENERAL:  Well appearing NECK:  No jugular venous distention, waveform within normal limits, carotid upstroke brisk and symmetric, no bruits, no thyromegaly LUNGS:  Clear to auscultation bilaterally CHEST:  Unremarkable HEART:  PMI not displaced or sustained,S1 and S2  within normal limits, no S3, no S4, no clicks, no rubs, no murmurs ABD:  Flat, positive bowel sounds normal in frequency in pitch, no bruits, no rebound, no guarding, no midline pulsatile mass, no hepatomegaly, no splenomegaly EXT:  2 plus pulses throughout, trace ankle edema, no cyanosis no clubbing  EKG:  EKG  is ordered today. The ekg ordered today demonstrates sinus rhythm, rate 84, axis right axis deviation, limb lead reversal, unusual P wave axis indicates this, nonspecific inferior and anterior T wave flattening.  PACs   Recent Labs: 05/24/2019: ALT 21; B Natriuretic Peptide 1,733.6 05/31/2019: BUN 40; Creatinine, Ser 1.44; Hemoglobin 11.0; Platelets 164; Potassium 4.7; Sodium 132    Lipid Panel    Component Value Date/Time   CHOL 81 05/26/2019 0839   TRIG 87 05/26/2019 0839   HDL 28 (L) 05/26/2019 0839   CHOLHDL 2.9 05/26/2019 0839   VLDL 17 05/26/2019 0839   LDLCALC 36 05/26/2019 0839      Wt Readings from Last 3 Encounters:  01/18/20 171 lb (77.6 kg)  11/23/19 171 lb 8 oz (77.8 kg)  09/23/19 172 lb (78 kg)      Other studies Reviewed: Additional studies/ records that were reviewed today include: Nursing Home records. Review of the above records demonstrates:  Please see elsewhere in the note.     ASSESSMENT AND PLAN:  Chronic diastolic HF:      Seems to be euvolemic.  We talked again about as needed dosing of his diuretic.  At the last visit he did have his Lasix increased so I will check a basic metabolic profile.  Otherwise no change in therapy.   CKD IIIA:   I will check a creatinine as well.  HTN: Well-controlled.  No change in therapy.  COPD:   This is managed by his primary provider.  He has not seen pulmonary years.  NSTEMI: He was diagnosed with this at the time of his hospitalization last year.  However, he declined invasive evaluation has had no ongoing pain consistent with angina.  We will manage this conservatively.  No change in therapy.    COUGH:  Chills.  His lung exam demonstrates decreased breath sounds but no evidence of consolidation wheezing or crackles and I do not see evidence for infection but will refer him back to his primary provider for follow-up.   Current medicines are reviewed at length with the patient today.  The patient does not have concerns regarding medicines.  The following changes have been made: As above  Labs/ tests ordered today include: None  Orders Placed This Encounter  Procedures  . Basic metabolic panel  . CBC  . EKG 12-Lead     Disposition:   FU with APP in about 3 months.   Signed, Minus Breeding, MD  01/18/2020 2:53 PM    [facility]

## 2020-01-17 ENCOUNTER — Ambulatory Visit: Payer: Medicare Other

## 2020-01-17 ENCOUNTER — Ambulatory Visit
Admission: RE | Admit: 2020-01-17 | Discharge: 2020-01-17 | Disposition: A | Discharge status: Home / Self Care | Payer: Medicare Other | Source: Ambulatory Visit | Attending: Radiation Oncology | Admitting: Radiation Oncology

## 2020-01-17 ENCOUNTER — Other Ambulatory Visit: Payer: Self-pay

## 2020-01-17 DIAGNOSIS — Z510 Encounter for antineoplastic radiation therapy: Secondary | ICD-10-CM | POA: Diagnosis not present

## 2020-01-17 DIAGNOSIS — C44311 Basal cell carcinoma of skin of nose: Secondary | ICD-10-CM | POA: Diagnosis present

## 2020-01-18 ENCOUNTER — Ambulatory Visit (INDEPENDENT_AMBULATORY_CARE_PROVIDER_SITE_OTHER): Payer: Medicare Other | Admitting: Cardiology

## 2020-01-18 ENCOUNTER — Ambulatory Visit: Payer: Medicare Other

## 2020-01-18 ENCOUNTER — Encounter: Payer: Self-pay | Admitting: Cardiology

## 2020-01-18 ENCOUNTER — Telehealth: Payer: Self-pay | Admitting: Cardiology

## 2020-01-18 VITALS — BP 104/70 | HR 84 | Ht 69.0 in | Wt 171.0 lb

## 2020-01-18 DIAGNOSIS — N1831 Chronic kidney disease, stage 3a: Secondary | ICD-10-CM | POA: Diagnosis not present

## 2020-01-18 DIAGNOSIS — I10 Essential (primary) hypertension: Secondary | ICD-10-CM | POA: Diagnosis not present

## 2020-01-18 DIAGNOSIS — I5032 Chronic diastolic (congestive) heart failure: Secondary | ICD-10-CM

## 2020-01-18 NOTE — Telephone Encounter (Signed)
Returned the call to the patient. He did have an appointment and was there. Do not see any missed appointments from today.

## 2020-01-18 NOTE — Telephone Encounter (Signed)
Patient states he received a call from our office regarding him missing his appt today. He states he came in and I do see the office notes, I'm not sure who would be calling. Please advise.

## 2020-01-18 NOTE — Patient Instructions (Signed)
Medication Instructions:   No changes *If you need a refill on your cardiac medications before your next appointment, please call your pharmacy*  Lab Work: Your physician recommends that you return for lab work today: BMP, CBC If you have labs (blood work) drawn today and your tests are completely normal, you will receive your results only by: Marland Kitchen MyChart Message (if you have MyChart) OR . A paper copy in the mail If you have any lab test that is abnormal or we need to change your treatment, we will call you to review the results.  Testing/Procedures: None ordered this visit  Follow-Up: At [facility], you and your health needs are our priority.  As part of our continuing mission to provide you with exceptional heart care, we have created designated Provider Care Teams.  These Care Teams include your primary Cardiologist (physician) and Advanced Practice Providers (APPs -  Physician Assistants and Nurse Practitioners) who all work together to provide you with the care you need, when you need it.    Your next appointment:   12 month(s)  You will receive a reminder letter in the mail two months in advance. If you don't receive a letter, please call our office to schedule the follow-up appointment.  The format for your next appointment:   In Person  Provider:   Rollene Rotunda, MD

## 2020-01-19 ENCOUNTER — Telehealth: Payer: Self-pay | Admitting: Pharmacist Clinician (PhC)/ Clinical Pharmacy Specialist

## 2020-01-19 ENCOUNTER — Ambulatory Visit
Admission: RE | Admit: 2020-01-19 | Discharge: 2020-01-19 | Disposition: A | Discharge status: Home / Self Care | Payer: Medicare Other | Source: Ambulatory Visit | Attending: Radiation Oncology | Admitting: Radiation Oncology

## 2020-01-19 ENCOUNTER — Other Ambulatory Visit: Payer: Self-pay | Admitting: Cardiology

## 2020-01-19 DIAGNOSIS — Z510 Encounter for antineoplastic radiation therapy: Secondary | ICD-10-CM | POA: Diagnosis not present

## 2020-01-19 DIAGNOSIS — E875 Hyperkalemia: Secondary | ICD-10-CM

## 2020-01-19 LAB — CBC
Hematocrit: 40.6 % (ref 37.5–51.0)
Hemoglobin: 12.6 g/dL — ABNORMAL LOW (ref 13.0–17.7)
MCH: 27.3 pg (ref 26.6–33.0)
MCHC: 31 g/dL — ABNORMAL LOW (ref 31.5–35.7)
MCV: 88 fL (ref 79–97)
Platelets: 247 10*3/uL (ref 150–450)
RBC: 4.61 x10E6/uL (ref 4.14–5.80)
RDW: 13.5 % (ref 11.6–15.4)
WBC: 9 10*3/uL (ref 3.4–10.8)

## 2020-01-19 LAB — BASIC METABOLIC PANEL
BUN/Creatinine Ratio: 19 (ref 10–24)
BUN: 30 mg/dL — ABNORMAL HIGH (ref 8–27)
CO2: 26 mmol/L (ref 20–29)
Calcium: 9.1 mg/dL (ref 8.6–10.2)
Chloride: 99 mmol/L (ref 96–106)
Creatinine, Ser: 1.57 mg/dL — ABNORMAL HIGH (ref 0.76–1.27)
GFR calc Af Amer: 51 mL/min/{1.73_m2} — ABNORMAL LOW (ref >59)
GFR calc non Af Amer: 44 mL/min/{1.73_m2} — ABNORMAL LOW (ref >59)
Glucose: 215 mg/dL — ABNORMAL HIGH (ref 65–99)
Potassium: 5.8 mmol/L (ref 3.5–5.2)
Sodium: 139 mmol/L (ref 134–144)

## 2020-01-19 MED ORDER — LOKELMA 10 G PO PACK: 10.0000 g | PACK | Freq: Three times a day (TID) | ORAL | 0 refills | Status: DC

## 2020-01-19 MED ORDER — LOKELMA 10 G PO PACK: 10.0000 g | PACK | Freq: Three times a day (TID) | ORAL | 0 refills | Status: AC

## 2020-01-19 NOTE — Telephone Encounter (Signed)
Calling to report critical lab.  Potassium 5.8.

## 2020-01-19 NOTE — Progress Notes (Signed)
I am acting as doctor of the day today. Patient had a critical lab of potassium 5.8 resulted today. Last K 4.7. I am recommended lokelma 10 gm for three doses. It is a three times a day medication; preferred would be for dose this afternoon, dose around 11 PM, and then dose tomorrow morning. Alternatively, can dose once this afternoon, once tomorrow morning, and once tomorrow mid-day. Should have repeat BMET at least 8 hours after last dose, either 1/6 afternoon if last dose 1/6 AM, or 1/7 if last dose is afternoon 1/6.  Jodelle Red, MD, PhD, [facility]  [facility]  [address], [address] [place], [place] [address] [phone]

## 2020-01-19 NOTE — Telephone Encounter (Addendum)
Spoke with patient, patient not currently taking any type of potassium supplement. Patient stated that he is about to leave to go his daily treatment with radiology. Advised patient that I would speak with DOD (Dr. Cristal Deer) regarding patients elevated potassium level.   Returned call to patient to give patient Dr. Di Kindle (DOD) recommendations of 3 doses of Lokelma 10mg . First dose right after pick up of samples, second dose before bed, and third dose tomorrow morning. With repeat BMET Tomorrow afternoon or Friday of this week. Patient states he will not be able to come by today to pick up samples and is currently busy and will have to call back to office.   Patient called back into office and states that he lives in a facility. Per facility guidelines a written order will need to be done with written instructions for the [facility]. Labs can be done at the facility but will need an order for the BMET to be drawn through [facility]"  Per Patient he will come by today after radiation treatment to pick up lokelma samples, written prescription, and instructions.   Forwarding message to DOD- for written prescription and instructions.

## 2020-01-19 NOTE — Telephone Encounter (Signed)
The facility nurse Mick Sell stopped by office to pick up medication. Printed orders and instruction provided. Nurse verbalized understanding and state facility will drawn labs as directed and fax for MD to review.

## 2020-01-19 NOTE — Telephone Encounter (Signed)
Left message to call back  

## 2020-01-20 ENCOUNTER — Ambulatory Visit: Payer: Medicare Other

## 2020-01-20 ENCOUNTER — Other Ambulatory Visit: Payer: Self-pay | Admitting: Radiation Oncology

## 2020-01-21 ENCOUNTER — Ambulatory Visit: Payer: Medicare Other

## 2020-01-21 ENCOUNTER — Ambulatory Visit
Admission: RE | Admit: 2020-01-21 | Discharge: 2020-01-21 | Disposition: A | Discharge status: Home / Self Care | Payer: Medicare Other | Source: Ambulatory Visit | Attending: Radiation Oncology | Admitting: Radiation Oncology

## 2020-01-21 ENCOUNTER — Other Ambulatory Visit: Payer: Self-pay

## 2020-01-21 DIAGNOSIS — Z510 Encounter for antineoplastic radiation therapy: Secondary | ICD-10-CM | POA: Diagnosis not present

## 2020-01-24 ENCOUNTER — Ambulatory Visit: Payer: Medicare Other

## 2020-01-24 ENCOUNTER — Ambulatory Visit
Admission: RE | Admit: 2020-01-24 | Discharge: 2020-01-24 | Disposition: A | Discharge status: Home / Self Care | Payer: Medicare Other | Source: Ambulatory Visit | Attending: Radiation Oncology | Admitting: Radiation Oncology

## 2020-01-24 DIAGNOSIS — Z510 Encounter for antineoplastic radiation therapy: Secondary | ICD-10-CM | POA: Diagnosis not present

## 2020-01-25 ENCOUNTER — Ambulatory Visit: Payer: Medicare Other

## 2020-01-26 ENCOUNTER — Other Ambulatory Visit: Payer: Self-pay

## 2020-01-26 ENCOUNTER — Ambulatory Visit: Payer: Medicare Other

## 2020-01-26 ENCOUNTER — Ambulatory Visit
Admission: RE | Admit: 2020-01-26 | Discharge: 2020-01-26 | Disposition: A | Discharge status: Home / Self Care | Payer: Medicare Other | Source: Ambulatory Visit | Attending: Radiation Oncology | Admitting: Radiation Oncology

## 2020-01-26 ENCOUNTER — Encounter: Payer: Self-pay | Admitting: Radiation Oncology

## 2020-01-26 DIAGNOSIS — Z510 Encounter for antineoplastic radiation therapy: Secondary | ICD-10-CM | POA: Diagnosis not present

## 2020-01-27 ENCOUNTER — Ambulatory Visit: Payer: Medicare Other

## 2020-01-28 ENCOUNTER — Ambulatory Visit: Payer: Medicare Other

## 2020-01-31 ENCOUNTER — Ambulatory Visit: Payer: Medicare Other

## 2020-02-01 ENCOUNTER — Ambulatory Visit: Payer: Medicare Other

## 2020-02-02 ENCOUNTER — Ambulatory Visit: Payer: Medicare Other

## 2020-02-04 ENCOUNTER — Ambulatory Visit: Payer: Medicare Other

## 2020-02-07 ENCOUNTER — Ambulatory Visit: Payer: Medicare Other

## 2020-02-09 ENCOUNTER — Ambulatory Visit: Payer: Medicare Other

## 2020-02-10 NOTE — Progress Notes (Signed)
  Radiation Oncology         [phone] ________________________________  Name: Adrian Andrews MRN: [id number]  Date: 01/26/2020  DOB: [birth year]  End of Treatment Note  Diagnosis:   stage I basal cell cancer of the right nose      Indication for treatment::  curative       Radiation treatment dates:   12/22/19 - 01/26/20  Site/dose:   The right nose was treated with an en face electron field to a dose of 48 Gy in 12 fractions. This was completed with 6 MeV electrons.  Narrative: The patient tolerated radiation treatment relatively well.   Mild erythema present during the course of XRT.  Plan: The patient has completed radiation treatment. The patient will return to radiation oncology clinic for routine followup in one month. I advised the patient to call or return sooner if they have any questions or concerns related to their recovery or treatment. ________________________________  Jodelle Gross, M.D., Ph.D.

## 2020-02-21 ENCOUNTER — Other Ambulatory Visit: Payer: Self-pay

## 2020-02-21 ENCOUNTER — Ambulatory Visit
Admission: RE | Admit: 2020-02-21 | Discharge: 2020-02-21 | Disposition: A | Discharge status: Home / Self Care | Payer: Medicare Other | Source: Ambulatory Visit | Attending: Radiation Oncology | Admitting: Radiation Oncology

## 2020-02-21 DIAGNOSIS — C44311 Basal cell carcinoma of skin of nose: Secondary | ICD-10-CM

## 2020-02-21 NOTE — Progress Notes (Signed)
°  Radiation Oncology         [phone] ________________________________  Name: Adrian Andrews MRN: [id number]  Date of Service: 02/21/2020  DOB: 13-Mar-1950  Post Treatment Telephone Note  Diagnosis:   Basal Cell Carcinoma of the right nasal bridge.  Interval Since Last Radiation:  4 weeks   12/22/19 - 01/26/20: The right nose was treated with an en face electron field to a dose of 48 Gy in 12 fractions. This was completed with 6 MeV electrons.  Narrative:  The patient was contacted today for routine follow-up. During treatment he did very well with radiotherapy and did not have significant desquamation. He reports he is doing well. His skin has healed and he has plans to follow up with dermatology given another lesion in his scapula.   Impression/Plan: 1. Basal Cell Carcinoma of the right nasal bridge. The patient has been doing well since completion of radiotherapy. We discussed that we would be happy to continue to follow him as needed, but he will also continue to follow up with Dr. Harless Litten, [facility] /Dr. Renda Rolls.      Carola Rhine, PAC
